# Patient Record
Sex: Female | Born: 1955 | Race: White | Hispanic: No | Smoking: Current every day smoker
Health system: Southern US, Community
[De-identification: ages and names within clinical notes are randomized; demographics above are authoritative.]

## PROBLEM LIST (undated history)

## (undated) DIAGNOSIS — K219 Gastro-esophageal reflux disease without esophagitis: Secondary | ICD-10-CM

## (undated) DIAGNOSIS — C801 Malignant (primary) neoplasm, unspecified: Secondary | ICD-10-CM

## (undated) DIAGNOSIS — C50919 Malignant neoplasm of unspecified site of unspecified female breast: Secondary | ICD-10-CM

## (undated) DIAGNOSIS — R7303 Prediabetes: Secondary | ICD-10-CM

## (undated) DIAGNOSIS — L02229 Furuncle of trunk, unspecified: Secondary | ICD-10-CM

## (undated) DIAGNOSIS — Z720 Tobacco use: Secondary | ICD-10-CM

## (undated) DIAGNOSIS — F419 Anxiety disorder, unspecified: Secondary | ICD-10-CM

## (undated) DIAGNOSIS — J189 Pneumonia, unspecified organism: Secondary | ICD-10-CM

## (undated) DIAGNOSIS — L02239 Carbuncle of trunk, unspecified: Secondary | ICD-10-CM

## (undated) DIAGNOSIS — A4902 Methicillin resistant Staphylococcus aureus infection, unspecified site: Secondary | ICD-10-CM

## (undated) DIAGNOSIS — Z923 Personal history of irradiation: Secondary | ICD-10-CM

## (undated) DIAGNOSIS — I10 Essential (primary) hypertension: Secondary | ICD-10-CM

## (undated) DIAGNOSIS — J45909 Unspecified asthma, uncomplicated: Secondary | ICD-10-CM

## (undated) DIAGNOSIS — Z733 Stress, not elsewhere classified: Secondary | ICD-10-CM

## (undated) DIAGNOSIS — R0600 Dyspnea, unspecified: Secondary | ICD-10-CM

## (undated) DIAGNOSIS — J449 Chronic obstructive pulmonary disease, unspecified: Secondary | ICD-10-CM

## (undated) DIAGNOSIS — E119 Type 2 diabetes mellitus without complications: Secondary | ICD-10-CM

## (undated) DIAGNOSIS — E785 Hyperlipidemia, unspecified: Secondary | ICD-10-CM

## (undated) DIAGNOSIS — F32A Depression, unspecified: Secondary | ICD-10-CM

## (undated) HISTORY — DX: Hyperlipidemia, unspecified: E78.5

## (undated) HISTORY — DX: Unspecified asthma, uncomplicated: J45.909

## (undated) HISTORY — DX: Anxiety disorder, unspecified: F41.9

## (undated) HISTORY — DX: Tobacco use: Z72.0

## (undated) HISTORY — DX: Essential (primary) hypertension: I10

## (undated) HISTORY — PX: TUBAL LIGATION: SHX77

## (undated) HISTORY — DX: Stress, not elsewhere classified: Z73.3

## (undated) HISTORY — DX: Carbuncle of trunk, unspecified: L02.239

## (undated) HISTORY — DX: Furuncle of trunk, unspecified: L02.229

## (undated) HISTORY — PX: TONSILLECTOMY: SUR1361

## (undated) HISTORY — DX: Methicillin resistant Staphylococcus aureus infection, unspecified site: A49.02

## (undated) HISTORY — PX: OTHER SURGICAL HISTORY: SHX169

---

## 2005-03-07 ENCOUNTER — Ambulatory Visit: Payer: Self-pay

## 2005-10-02 ENCOUNTER — Other Ambulatory Visit: Payer: Self-pay

## 2005-10-10 ENCOUNTER — Ambulatory Visit: Payer: Self-pay | Admitting: Obstetrics and Gynecology

## 2009-02-18 ENCOUNTER — Emergency Department: Payer: Self-pay | Admitting: Unknown Physician Specialty

## 2009-11-07 ENCOUNTER — Ambulatory Visit: Payer: Self-pay | Admitting: Family Medicine

## 2010-01-11 ENCOUNTER — Emergency Department: Payer: Self-pay | Admitting: Emergency Medicine

## 2010-01-15 ENCOUNTER — Ambulatory Visit: Payer: Self-pay | Admitting: Ophthalmology

## 2010-11-09 ENCOUNTER — Ambulatory Visit: Payer: Self-pay

## 2011-12-17 ENCOUNTER — Ambulatory Visit: Payer: Self-pay | Admitting: Family Medicine

## 2011-12-30 ENCOUNTER — Emergency Department: Payer: Self-pay | Admitting: Emergency Medicine

## 2011-12-30 LAB — CBC
HCT: 46.6 % (ref 35.0–47.0)
HGB: 15.3 g/dL (ref 12.0–16.0)
MCH: 29.5 pg (ref 26.0–34.0)
MCHC: 32.9 g/dL (ref 32.0–36.0)
MCV: 90 fL (ref 80–100)
Platelet: 233 10*3/uL (ref 150–440)
RBC: 5.2 10*6/uL (ref 3.80–5.20)
RDW: 13.5 % (ref 11.5–14.5)
WBC: 8 10*3/uL (ref 3.6–11.0)

## 2011-12-30 LAB — COMPREHENSIVE METABOLIC PANEL
Albumin: 3.9 g/dL (ref 3.4–5.0)
Alkaline Phosphatase: 96 U/L (ref 50–136)
Anion Gap: 9 (ref 7–16)
BUN: 9 mg/dL (ref 7–18)
Bilirubin,Total: 0.4 mg/dL (ref 0.2–1.0)
Calcium, Total: 9.6 mg/dL (ref 8.5–10.1)
Chloride: 105 mmol/L (ref 98–107)
Co2: 27 mmol/L (ref 21–32)
Creatinine: 0.65 mg/dL (ref 0.60–1.30)
EGFR (African American): >60
EGFR (Non-African Amer.): >60
Glucose: 104 mg/dL — ABNORMAL HIGH (ref 65–99)
Osmolality: 280 (ref 275–301)
Potassium: 3.6 mmol/L (ref 3.5–5.1)
SGOT(AST): 20 U/L (ref 15–37)
SGPT (ALT): 30 U/L
Sodium: 141 mmol/L (ref 136–145)
Total Protein: 7.7 g/dL (ref 6.4–8.2)

## 2011-12-30 LAB — TROPONIN I
Troponin-I: <0.02 ng/mL
Troponin-I: <0.02 ng/mL

## 2012-01-03 ENCOUNTER — Encounter: Payer: Self-pay | Admitting: *Deleted

## 2012-01-07 ENCOUNTER — Encounter: Payer: Self-pay | Admitting: Cardiovascular Disease

## 2012-01-07 ENCOUNTER — Ambulatory Visit (INDEPENDENT_AMBULATORY_CARE_PROVIDER_SITE_OTHER): Payer: BC Managed Care – PPO | Admitting: Cardiovascular Disease

## 2012-01-07 VITALS — BP 132/86 | HR 84 | Ht 67.0 in | Wt 176.0 lb

## 2012-01-07 DIAGNOSIS — R079 Chest pain, unspecified: Secondary | ICD-10-CM | POA: Insufficient documentation

## 2012-01-07 DIAGNOSIS — E785 Hyperlipidemia, unspecified: Secondary | ICD-10-CM

## 2012-01-07 DIAGNOSIS — R0609 Other forms of dyspnea: Secondary | ICD-10-CM

## 2012-01-07 DIAGNOSIS — R0600 Dyspnea, unspecified: Secondary | ICD-10-CM | POA: Insufficient documentation

## 2012-01-07 DIAGNOSIS — I10 Essential (primary) hypertension: Secondary | ICD-10-CM

## 2012-01-07 DIAGNOSIS — R0989 Other specified symptoms and signs involving the circulatory and respiratory systems: Secondary | ICD-10-CM

## 2012-01-07 DIAGNOSIS — F17200 Nicotine dependence, unspecified, uncomplicated: Secondary | ICD-10-CM

## 2012-01-07 DIAGNOSIS — Z720 Tobacco use: Secondary | ICD-10-CM | POA: Insufficient documentation

## 2012-01-07 NOTE — Assessment & Plan Note (Signed)
I discussed with her the importance of smoking cessation. 

## 2012-01-07 NOTE — Patient Instructions (Signed)
Your physician has requested that you have an exercise tolerance test. For further information please visit [email or web address]. Please also follow instruction sheet, as given.  Your physician has requested that you have an echocardiogram. Echocardiography is a painless test that uses sound waves to create images of your heart. It provides your doctor with information about the size and shape of your heart and how well your heart's chambers and valves are working. This procedure takes approximately one hour. There are no restrictions for this procedure.   

## 2012-01-07 NOTE — Assessment & Plan Note (Signed)
The patient does have exertional dyspnea which could be due to her prolonged history of smoking. I will request an echocardiogram to evaluate her LV systolic and diastolic function.

## 2012-01-07 NOTE — Assessment & Plan Note (Signed)
Given her family history of premature coronary artery disease, I agree with treatment with pravastatin. I recommend a target LDL of less than 100 if possible and triglycerides less than 150.

## 2012-01-07 NOTE — Assessment & Plan Note (Signed)
Her blood pressure is well controlled. Continue current medications. 

## 2012-01-07 NOTE — Assessment & Plan Note (Signed)
Her chest pain is atypical and could have been due to GERD. She has not had any further episodes. She does have risk factors for coronary artery disease and thus I recommend further ischemic cardiac evaluation. Her ECG is normal and she is able to exercise on a treadmill. Thus, I will request a treadmill stress test for further evaluation. I discussed with the patient the importance of lifestyle changes in order to decrease the chance of future coronary artery disease and cardiovascular events. We discussed the importance of controlling risk factors, smoking cessation, healthy diet as well as regular exercise. I also explained to him that a normal stress test does not rule out atherosclerosis.

## 2012-01-07 NOTE — Progress Notes (Signed)
HPI  This is a 56 year old female who is here today for evaluation of chest pain. She was referred from the emergency room at [facility]. She has chronic medical conditions that include hypertension, hyperlipidemia, tobacco use and family history of premature coronary artery disease. She went to the emergency room about a week ago after she had substernal tightness feeling which lasted for about 15-30 minutes. It was associated with heartburn and resolved with Pepcid. She thought it was triggered by tomato sauce. She was not aware of previous history of GERD. She has no history of coronary artery disease or other cardiac abnormalities. In the emergency room, her labs were unremarkable including negative cardiac enzymes. Chest x-ray was normal an ECG showed no acute changes. The patient has not had any further chest pain. She does complain of dyspnea with activities which she always contributed to her smoking history.  No Known Allergies   Current Outpatient Prescriptions on File Prior to Visit  Medication Sig Dispense Refill  . albuterol (PROAIR HFA) 108 (90 BASE) MCG/ACT inhaler Inhale 2 puffs into the lungs every 6 (six) hours as needed.      Marland Kitchen amLODipine (NORVASC) 5 MG tablet Take 1 tablet by mouth Daily.      . hydrochlorothiazide (HYDRODIURIL) 25 MG tablet Take 25 mg by mouth daily.      . pravastatin (PRAVACHOL) 40 MG tablet Take 40 mg by mouth daily.         Past Medical History  Diagnosis Date  . Anxiety   . Other psychological or physical stress, not elsewhere classified   . Methicillin resistant Staphylococcus aureus in conditions classified elsewhere and of unspecified site   . Carbuncle and furuncle of trunk   . Intrinsic asthma, unspecified   . Tobacco abuse   . HTN (hypertension)   . Hyperlipidemia      Past Surgical History  Procedure Date  . Tubal ligation     bilateral, sterile ablation  . Status post endometrial ablation   . Varicose veins   . Hysteroscopy with d&c    . Tonsillectomy      Family History  Problem Relation Age of Onset  . Coronary artery disease Mother   . Hypertension Mother   . Rheum arthritis Mother   . Heart attack Mother   . Lung cancer Father   . Emphysema Father   . Gout Father   . Diabetes Sister   . Hypertension Daughter   . Hypertension Daughter   . Obesity Daughter   . Seizures Daughter      History   Social History  . Marital Status: Married    Spouse Name: N/A    Number of Children: N/A  . Years of Education: N/A   Occupational History  . Not on file.   Social History Main Topics  . Smoking status: Current Everyday Smoker -- 1.0 packs/day  . Smokeless tobacco: Never Used  . Alcohol Use: No  . Drug Use: No  . Sexually Active: Not on file   Other Topics Concern  . Not on file   Social History Narrative  . No narrative on file     ROS Constitutional: Negative for fever, chills, diaphoresis, activity change, appetite change and fatigue.  HENT: Negative for hearing loss, nosebleeds, congestion, sore throat, facial swelling, drooling, trouble swallowing, neck pain, voice change, sinus pressure and tinnitus.  Eyes: Negative for photophobia, pain, discharge and visual disturbance.  Respiratory: Negative for apnea, cough and wheezing.  Cardiovascular: Negative  for  palpitations and leg swelling.  Gastrointestinal: Negative for nausea, vomiting, abdominal pain, diarrhea, constipation, blood in stool and abdominal distention.  Genitourinary: Negative for dysuria, urgency, frequency, hematuria and decreased urine volume.  Musculoskeletal: Negative for myalgias, back pain, joint swelling, arthralgias and gait problem.  Skin: Negative for color change, pallor, rash and wound.  Neurological: Negative for dizziness, tremors, seizures, syncope, speech difficulty, weakness, light-headedness, numbness and headaches.  Psychiatric/Behavioral: Negative for suicidal ideas, hallucinations, behavioral problems and  agitation. The patient is not nervous/anxious.    PHYSICAL EXAM   BP 132/86  Pulse 84  Ht 5\' 7"  (1.702 m)  Wt 176 lb (79.833 kg)  BMI 27.57 kg/m2 Constitutional: She is oriented to person, place, and time. She appears well-developed and well-nourished. No distress.  HENT: No nasal discharge.  Head: Normocephalic and atraumatic.  Eyes: Pupils are equal and round. Right eye exhibits no discharge. Left eye exhibits no discharge.  Neck: Normal range of motion. Neck supple. No JVD present. No thyromegaly present. No carotid bruits Cardiovascular: Normal rate, regular rhythm, normal heart sounds. Exam reveals no gallop and no friction rub. No murmur heard.  Pulmonary/Chest: Effort normal and breath sounds normal. No stridor. No respiratory distress. She has no wheezes. She has no rales. She exhibits no tenderness.  Abdominal: Soft. Bowel sounds are normal. She exhibits no distension. There is no tenderness. There is no rebound and no guarding.  Musculoskeletal: Normal range of motion. She exhibits no edema and no tenderness.  Neurological: She is alert and oriented to person, place, and time. Coordination normal.  Skin: Skin is warm and dry. No rash noted. She is not diaphoretic. No erythema. No pallor.  Psychiatric: She has a normal mood and affect. Her behavior is normal. Judgment and thought content normal.     EKG: Normal sinus rhythm with no significant ST or T wave changes.   ASSESSMENT AND PLAN

## 2012-01-15 ENCOUNTER — Other Ambulatory Visit: Payer: BC Managed Care – PPO

## 2012-01-17 ENCOUNTER — Encounter: Payer: Self-pay | Admitting: Cardiovascular Disease

## 2012-01-17 ENCOUNTER — Ambulatory Visit (INDEPENDENT_AMBULATORY_CARE_PROVIDER_SITE_OTHER): Payer: BC Managed Care – PPO | Admitting: Cardiovascular Disease

## 2012-01-17 DIAGNOSIS — R079 Chest pain, unspecified: Secondary | ICD-10-CM

## 2012-01-17 NOTE — Procedures (Signed)
    Treadmill Stress test  Indication: Atypical chest pain  Baseline Data:  Resting EKG shows NSR with rate of 75 bpm, no significant ST or T wave changes Resting blood pressure of 140/80 mm Hg Stand bruce protocal was used.  Exercise Data:  Patient exercised for 7 min 7 sec,  Peak heart rate of 139 bpm.  This was 85 % of the maximum predicted heart rate. No symptoms of chest pain or lightheadedness were reported at peak stress or in recovery.  Peak Blood pressure recorded was 200/98 Maximal work level: 10.1 METs.  Heart rate at 3 minutes in recovery was 93 bpm. BP response: Hypertensive. HR response: Normal.  EKG with Exercise: Sinus tachycardia with no significant ST changes with exercise.  FINAL IMPRESSION: Normal exercise stress test. No significant EKG changes concerning for ischemia. Good exercise tolerance. Hypertensive response to exercise.  Recommendation: The chest pain was likely noncardiac.

## 2012-02-05 ENCOUNTER — Other Ambulatory Visit: Payer: BC Managed Care – PPO

## 2012-03-04 ENCOUNTER — Telehealth: Payer: Self-pay

## 2012-03-04 NOTE — Telephone Encounter (Signed)
Message copied by Marcelle Overlie on Wed Mar 04, 2012  9:39 AM ------      Message from: Thersa Salt      Created: Tue Mar 03, 2012  4:04 PM      Regarding: RE: question       Billing [phone]      ----- Message -----         From: Marcelle Overlie, RN         Sent: 03/03/2012   2:44 PM           To: Angelina Sheriff Little      Subject: question                                                 Pt has questions re: bill she rec'd for test/OV.  Who can I forward this to?

## 2012-03-04 NOTE — Telephone Encounter (Signed)
LM on pt's machine with billing phone #

## 2012-03-21 ENCOUNTER — Emergency Department: Payer: Self-pay | Admitting: Internal Medicine

## 2012-03-21 LAB — CBC
HGB: 14.9 g/dL (ref 12.0–16.0)
MCH: 29.7 pg (ref 26.0–34.0)
MCHC: 33.2 g/dL (ref 32.0–36.0)
MCV: 89 fL (ref 80–100)
Platelet: 234 10*3/uL (ref 150–440)
RDW: 13.6 % (ref 11.5–14.5)

## 2012-03-21 LAB — BASIC METABOLIC PANEL
BUN: 7 mg/dL (ref 7–18)
Calcium, Total: 9.8 mg/dL (ref 8.5–10.1)
Chloride: 104 mmol/L (ref 98–107)
Creatinine: 0.62 mg/dL (ref 0.60–1.30)
EGFR (African American): >60
EGFR (Non-African Amer.): >60
Glucose: 113 mg/dL — ABNORMAL HIGH (ref 65–99)
Potassium: 3.4 mmol/L — ABNORMAL LOW (ref 3.5–5.1)
Sodium: 140 mmol/L (ref 136–145)

## 2012-03-21 LAB — CK TOTAL AND CKMB (NOT AT ARMC): CK-MB: 1.2 ng/mL (ref 0.5–3.6)

## 2012-12-23 ENCOUNTER — Ambulatory Visit: Payer: Self-pay | Admitting: Family Medicine

## 2014-02-02 ENCOUNTER — Ambulatory Visit: Payer: Self-pay | Admitting: Family Medicine

## 2014-05-24 ENCOUNTER — Ambulatory Visit: Payer: Self-pay | Admitting: Anesthesiology

## 2014-05-24 LAB — POTASSIUM: POTASSIUM: 3.4 mmol/L — AB (ref 3.5–5.1)

## 2014-05-25 ENCOUNTER — Ambulatory Visit: Payer: Self-pay | Admitting: Vascular Surgery

## 2014-05-26 LAB — PATHOLOGY REPORT

## 2014-12-17 ENCOUNTER — Emergency Department
Admit: 2014-12-17 | Disposition: A | Discharge status: Home / Self Care | Payer: Self-pay | Admitting: Emergency Medicine

## 2014-12-17 LAB — TROPONIN I: Troponin-I: <0.03 ng/mL

## 2014-12-17 LAB — CBC
HCT: 45.2 %
HGB: 15.2 g/dL
MCH: 29.7 pg
MCHC: 33.5 g/dL
MCV: 89 fL
Platelet: 227 x10 3/mm 3
RBC: 5.1 X10 6/mm 3
RDW: 13.3 %
WBC: 13.1 x10 3/mm 3 — ABNORMAL HIGH

## 2014-12-17 LAB — COMPREHENSIVE METABOLIC PANEL
ALBUMIN: 4.2 g/dL
ALK PHOS: 79 U/L
AST: 19 U/L
Anion Gap: 8 (ref 7–16)
BUN: 6 mg/dL
Bilirubin,Total: 0.6 mg/dL
Calcium, Total: 9.3 mg/dL
Chloride: 98 mmol/L — ABNORMAL LOW
Co2: 28 mmol/L
Creatinine: 0.67 mg/dL
EGFR (African American): >60
EGFR (Non-African Amer.): >60
GLUCOSE: 100 mg/dL — AB
Potassium: 3.4 mmol/L — ABNORMAL LOW
SGPT (ALT): 13 U/L — ABNORMAL LOW
Sodium: 134 mmol/L — ABNORMAL LOW
TOTAL PROTEIN: 7.1 g/dL

## 2014-12-17 LAB — CK TOTAL AND CKMB (NOT AT ARMC)
CK, TOTAL: 129 U/L
CK-MB: 3.8 ng/mL

## 2014-12-19 ENCOUNTER — Inpatient Hospital Stay
Admit: 2014-12-19 | Disposition: A | Discharge status: Home / Self Care | Payer: Self-pay | Attending: Internal Medicine | Admitting: Internal Medicine

## 2014-12-19 LAB — CBC WITH DIFFERENTIAL/PLATELET
Basophil #: 0 10*3/uL (ref 0.0–0.1)
Basophil %: 0.2 %
EOS ABS: 0 10*3/uL (ref 0.0–0.7)
EOS PCT: 0.2 %
HCT: 44.6 % (ref 35.0–47.0)
HGB: 14.6 g/dL (ref 12.0–16.0)
Lymphocyte #: 0.6 10*3/uL — ABNORMAL LOW (ref 1.0–3.6)
Lymphocyte %: 3.3 %
MCH: 28.9 pg (ref 26.0–34.0)
MCHC: 32.7 g/dL (ref 32.0–36.0)
MCV: 88 fL (ref 80–100)
MONOS PCT: 2.3 %
Monocyte #: 0.4 x10 3/mm (ref 0.2–0.9)
NEUTROS PCT: 94 %
Neutrophil #: 16 10*3/uL — ABNORMAL HIGH (ref 1.4–6.5)
PLATELETS: 220 10*3/uL (ref 150–440)
RBC: 5.05 10*6/uL (ref 3.80–5.20)
RDW: 13.6 % (ref 11.5–14.5)
WBC: 17 10*3/uL — AB (ref 3.6–11.0)

## 2014-12-19 LAB — BASIC METABOLIC PANEL
Anion Gap: 12 (ref 7–16)
BUN: 38 mg/dL — ABNORMAL HIGH
CHLORIDE: 94 mmol/L — AB
CO2: 25 mmol/L
CREATININE: 1.5 mg/dL — AB
Calcium, Total: 9.5 mg/dL
EGFR (African American): 44 — ABNORMAL LOW
GFR CALC NON AF AMER: 38 — AB
GLUCOSE: 144 mg/dL — AB
POTASSIUM: 3.2 mmol/L — AB
SODIUM: 131 mmol/L — AB

## 2014-12-19 LAB — URINALYSIS, COMPLETE
Bilirubin,UR: NEGATIVE
Glucose,UR: NEGATIVE mg/dL (ref 0–75)
KETONE: NEGATIVE
Leukocyte Esterase: NEGATIVE
Nitrite: NEGATIVE
PROTEIN: NEGATIVE
Ph: 5 (ref 4.5–8.0)
RBC,UR: NONE SEEN /HPF (ref 0–5)
SPECIFIC GRAVITY: 1.009 (ref 1.003–1.030)

## 2014-12-19 LAB — LIPASE, BLOOD: Lipase: 18 U/L — ABNORMAL LOW

## 2014-12-19 LAB — LACTIC ACID, PLASMA: LACTIC ACID, VENOUS: 1.5 mmol/L

## 2014-12-20 LAB — CBC WITH DIFFERENTIAL/PLATELET
Basophil #: 0 10*3/uL (ref 0.0–0.1)
Basophil %: 0.1 %
Eosinophil #: 0.2 10*3/uL (ref 0.0–0.7)
Eosinophil %: 1.2 %
HCT: 37.6 % (ref 35.0–47.0)
HGB: 12.6 g/dL (ref 12.0–16.0)
LYMPHS ABS: 0.8 10*3/uL — AB (ref 1.0–3.6)
Lymphocyte %: 5.4 %
MCH: 29.6 pg (ref 26.0–34.0)
MCHC: 33.6 g/dL (ref 32.0–36.0)
MCV: 88 fL (ref 80–100)
MONOS PCT: 3 %
Monocyte #: 0.4 x10 3/mm (ref 0.2–0.9)
Neutrophil #: 13.4 10*3/uL — ABNORMAL HIGH (ref 1.4–6.5)
Neutrophil %: 90.3 %
PLATELETS: 171 10*3/uL (ref 150–440)
RBC: 4.27 10*6/uL (ref 3.80–5.20)
RDW: 13.7 % (ref 11.5–14.5)
WBC: 14.8 10*3/uL — AB (ref 3.6–11.0)

## 2014-12-20 LAB — BASIC METABOLIC PANEL
Anion Gap: 6 — ABNORMAL LOW (ref 7–16)
BUN: 21 mg/dL — ABNORMAL HIGH
CALCIUM: 8.6 mg/dL — AB
CO2: 25 mmol/L
Chloride: 105 mmol/L
Creatinine: 0.74 mg/dL
EGFR (African American): >60
Glucose: 104 mg/dL — ABNORMAL HIGH
POTASSIUM: 2.8 mmol/L — AB
SODIUM: 136 mmol/L

## 2014-12-20 LAB — MAGNESIUM: Magnesium: 1.8 mg/dL

## 2014-12-21 LAB — BASIC METABOLIC PANEL
Anion Gap: 7 (ref 7–16)
BUN: 7 mg/dL
CHLORIDE: 105 mmol/L
CO2: 26 mmol/L
CREATININE: 0.55 mg/dL
Calcium, Total: 8.6 mg/dL — ABNORMAL LOW
EGFR (African American): >60
EGFR (Non-African Amer.): >60
Glucose: 90 mg/dL
POTASSIUM: 2.8 mmol/L — AB
Sodium: 138 mmol/L

## 2014-12-21 LAB — MAGNESIUM: Magnesium: 1.8 mg/dL

## 2014-12-22 LAB — BASIC METABOLIC PANEL
Anion Gap: 5 — ABNORMAL LOW (ref 7–16)
BUN: 5 mg/dL — AB
CHLORIDE: 109 mmol/L
CO2: 26 mmol/L
CREATININE: 0.51 mg/dL
Calcium, Total: 9.2 mg/dL
Glucose: 97 mg/dL
POTASSIUM: 3.9 mmol/L
Sodium: 140 mmol/L

## 2014-12-22 LAB — VANCOMYCIN, TROUGH: Vancomycin, Trough: 6 ug/mL — ABNORMAL LOW

## 2014-12-23 LAB — CBC WITH DIFFERENTIAL/PLATELET
COMMENT - H1-COM1: NORMAL
Comment - H1-Com2: NORMAL
HCT: 40.9 % (ref 35.0–47.0)
HGB: 13.4 g/dL (ref 12.0–16.0)
LYMPHS PCT: 17 %
MCH: 29.2 pg (ref 26.0–34.0)
MCHC: 32.8 g/dL (ref 32.0–36.0)
MCV: 89 fL (ref 80–100)
Monocytes: 12 %
Myelocyte: 1 %
Platelet: 231 10*3/uL (ref 150–440)
RBC: 4.59 10*6/uL (ref 3.80–5.20)
RDW: 14.1 % (ref 11.5–14.5)
Segmented Neutrophils: 70 %
WBC: 9.1 10*3/uL (ref 3.6–11.0)

## 2014-12-23 LAB — VANCOMYCIN, TROUGH: Vancomycin, Trough: 11 ug/mL

## 2014-12-24 LAB — POTASSIUM: Potassium: 4.2 mmol/L

## 2014-12-24 LAB — VANCOMYCIN, TROUGH: VANCOMYCIN, TROUGH: 17 ug/mL

## 2014-12-24 LAB — CULTURE, BLOOD (SINGLE)

## 2014-12-24 LAB — CREATININE, SERUM
CREATININE: 0.54 mg/dL
EGFR (Non-African Amer.): >60

## 2014-12-24 NOTE — Op Note (Signed)
PATIENT NAME:  Alexandria [name], Alexandria [name] MR#:  [id number] DATE OF BIRTH:  1956/08/10  DATE OF PROCEDURE:  05/25/2014  PREOPERATIVE DIAGNOSIS: Painful cyst, right lower extremity.   POSTOPERATIVE DIAGNOSIS: Painful cyst, right lower extremity.   PROCEDURE: Excision of right leg cyst.   SURGEON: Algernon Huxley, M.D.   ANESTHESIA: MAC.   ESTIMATED BLOOD LOSS: Minimal.   INDICATION FOR PROCEDURE: A 59 year old female with a painful cyst behind and just above her right knee. This was near her hamstring muscle and she had pain with sitting and with certain bendings of her knee. She desired to have this removed. This has enlarged over several years. It was discussed, this is highly unlikely to be any malignant process or anything dangerous, but as it was symptomatic, it was reasonable to remove this. She desired to proceed.   DESCRIPTION OF PROCEDURE: The patient was brought to the vascular suite. Right lower extremity was sterilely prepped and draped. A sterile surgical field was created. An elliptical incision was made around the cyst. After the area was locally anesthetized with a mixture of 1% lidocaine and 0.5% Marcaine, the cyst was then excised, taking care to get the entire cyst wall with the resection. The wound was then closed with 3-0 Vicryl suture in the subcutaneous tissue and 4 mattress 3-0 nylon sutures to close the skin. A sterile dressing was placed. The patient tolerated the procedure well and was taken to the recovery room in stable condition.     ____________________________ Algernon Huxley, MD jsd:JT D: 05/25/2014 13:22:56 ET T: 05/25/2014 13:56:39 ET JOB#: [id number]  cc: Algernon Huxley, MD, <Dictator> Algernon Huxley MD ELECTRONICALLY SIGNED 06/14/2014 10:19

## 2014-12-25 LAB — CBC WITH DIFFERENTIAL/PLATELET
BASOS ABS: 0.1 10*3/uL (ref 0.0–0.1)
Basophil %: 0.9 %
EOS ABS: 0.2 10*3/uL (ref 0.0–0.7)
EOS PCT: 2.1 %
HCT: 38.9 % (ref 35.0–47.0)
HGB: 12.9 g/dL (ref 12.0–16.0)
LYMPHS PCT: 19.8 %
Lymphocyte #: 1.9 10*3/uL (ref 1.0–3.6)
MCH: 29.1 pg (ref 26.0–34.0)
MCHC: 33.2 g/dL (ref 32.0–36.0)
MCV: 88 fL (ref 80–100)
MONO ABS: 0.7 x10 3/mm (ref 0.2–0.9)
Monocyte %: 7.2 %
NEUTROS PCT: 70 %
Neutrophil #: 6.6 10*3/uL — ABNORMAL HIGH (ref 1.4–6.5)
PLATELETS: 273 10*3/uL (ref 150–440)
RBC: 4.43 10*6/uL (ref 3.80–5.20)
RDW: 13.8 % (ref 11.5–14.5)
WBC: 9.4 10*3/uL (ref 3.6–11.0)

## 2014-12-25 LAB — BASIC METABOLIC PANEL
Anion Gap: 6 — ABNORMAL LOW (ref 7–16)
BUN: 10 mg/dL
CALCIUM: 9.2 mg/dL
Chloride: 100 mmol/L — ABNORMAL LOW
Co2: 27 mmol/L
Creatinine: 0.58 mg/dL
Glucose: 98 mg/dL
Potassium: 3.6 mmol/L
SODIUM: 133 mmol/L — AB

## 2014-12-26 LAB — CREATININE, SERUM
CREATININE: 0.53 mg/dL
EGFR (African American): >60
EGFR (Non-African Amer.): >60

## 2015-01-01 NOTE — Consult Note (Signed)
Brief Consult Note: Diagnosis: LUE cellulitus.   Patient was seen by consultant.   Consult note dictated.   Recommend further assessment or treatment.   Orders entered.   Comments: Continue current abx. Elevate arm as much as possible Can transition to orals in next 2-3 days if improving clinically.  Electronic Signatures: Angelena Form (MD)  (Signed 22-Apr-16 15:12)  Authored: Brief Consult Note   Last Updated: 22-Apr-16 15:12 by Angelena Form (MD)

## 2015-01-01 NOTE — Consult Note (Addendum)
PATIENT NAME:  Alexandria [name], Alexandria [name] MR#:  [id number] DATE OF BIRTH:  April 05, 1956  DATE OF CONSULTATION:  12/23/2014  REFERRING PHYSICIAN:   CONSULTING PHYSICIAN:  Cheral Marker. Ola Spurr, MD  REASON FOR CONSULTATION: Left upper extremity cellulitis.   HISTORY OF PRESENT ILLNESS: This is a pleasant 59 year old female with history of anxiety, hypertension, GERD and tobacco abuse, admitted with increasing redness and swelling of her left arm and elbow over several days. She was admitted on April 18. She had reported having no trauma to the area or insect bites. She has not had infection in this area before. She was found on admission to have leukocytosis. She also developed nausea and vomiting and had some acute kidney injury. She was admitted and has been on antibiotics since then. She has been elevating the arm and there has been slight improvement.   PAST MEDICAL HISTORY: 1. Hypertension.  2. Tobacco abuse.  3. GERD.  4. Hyperlipidemia.   PAST SURGICAL HISTORY: Colonoscopy, uterine ablation, tubal ligation, tonsillectomy, varicose vein stripping.   SOCIAL HISTORY: Smokes a pack a day. No alcohol or other drugs.   FAMILY HISTORY: Positive for coronary artery disease,  diabetes, and chronic obstructive pulmonary disease.   ALLERGIES: No known drug allergies.   MEDICATIONS: Reviewed in her MAR. Antibiotics since admission include vancomycin and Unasyn.   REVIEW OF SYSTEMS:  Eleven systems reviewed and negative except as per history of present illness.   PHYSICAL EXAMINATION: VITAL SIGNS: Temperature 98.7, pulse 88, blood pressure 148/68, respirations 18, saturation 96% on room air.  GENERAL: She is disheveled pleasant, interactive, in no acute distress.  HEENT: Pupils reactive. Sclerae anicteric.  OROPHARYNX: Clear with no thrush.  NECK: Supple.  HEART: Regular.  LUNGS: Clear.  ABDOMEN: Soft, nontender, nondistended.  EXTREMITIES: She has no lower extremity edema.  Her left upper extremity has 1+  to 2+ edema.  SKIN: There is erythema from above her elbow down to  mid forearm. There is some bogginess over her olecranon bursa, but no obvious drainable abscess.  NEUROLOGIC: She is alert and oriented x3. Grossly nonfocal neurological exam.   LABORATORY DATA: White blood count on admission April 16 was 13.1. It  elevated to 17.0 on April 18.   On April 22 it was 9.1, hemoglobin 13.4, platelets 231,000, blood cultures April 18 x2 negative. Urinalysis April 18 negative. Renal function initially had a creatinine of 1.50 currently 0.54. LFTs are within normal limits. Troponins were negative.   IMPRESSION: A 59 year old female with a history of prior methicillin-resistant Staphylococcus aureus boils per history who was admitted with left upper extremity swelling, redness, and drainage. She had no trauma or other prior predisposing injury. MRI shows what appears to be cellulitis or myositis. There is a tiny fluid collection, but nothing that is drainable. She has been seen by surgery as well. She has no evidence of a left upper extremity deep vein thrombosis. Clinically, she is improving with vancomycin and Unasyn.   RECOMMENDATIONS: 1. Continue vancomycin and Unasyn for now.  2. Elevate arm as much as possible.  3. She will likely need 1-2 more days of IV antibiotics and then could be transitioned to orals. Potentially she could be placed on Bactrim as well as Keflex to cover both Staph and Strep and MRSA.   Thank you for the consult. I will be glad to follow with you.    ____________________________ Cheral Marker. Ola Spurr, MD dpf:tr D: 12/24/2014 11:09:35 ET T: 12/24/2014 11:29:25 ET JOB#: [id number]  cc: Cheral Marker.  Ola Spurr, MD, <Dictator> Chalmer Zheng Ola Spurr MD ELECTRONICALLY SIGNED 01/03/2015 10:21

## 2015-01-01 NOTE — Discharge Summary (Signed)
PATIENT NAME:  Alexandria [name], WEBER MR#:  [id number] DATE OF BIRTH:  1955-09-30  DATE OF ADMISSION:  12/19/2014 DATE OF DISCHARGE:  12/26/2014  ADMITTING PHYSICIAN:   Lance Coon, MD  DISCHARGING PHYSICIAN: Gladstone Lighter, MD  PRIMARY CARE PHYSICIAN:  Lelon Huh, MD   [place]:  Surgical consultation by Sherri Rad, MD  ID consultation by Adrian Prows, MD   DISCHARGE DIAGNOSES: 1. Sepsis.  2. Left arm cellulitis.  3. Tobacco use disorder.  4. Gastroesophageal reflex disease.  5. Anxiety disorder.   HOME MEDICATIONS: 1. Pravastatin 40 mg p.o. daily.  2. Sertraline 50 mg p.o. in the morning.  3. Hydrochlorothiazide 25 mg p.o. daily.  4. Klonopin 0.5 mg p.o. daily.  5. Amlodipine 10 mg p.o. daily.  6. Norco 5/325 milligrams 1 tablet q. 6 hours p.r.n. for pain.  7. Bactrim double strength 1 tablet p.o. b.i.d. for 10 days.  8. Keflex 500 mg p.o. q. 8 hours for 10 more days.   DISCHARGE DIET: Regular diet.   DISCHARGE ACTIVITY: As tolerated.   FOLLOWUP INSTRUCTIONS: 1. Follow up with Dr. Ola Spurr in 1-2 weeks.  2. PCP follow-up in 2 weeks.   LABORATORY AND IMAGING STUDIES PRIOR TO DISCHARGE:  1. Sodium 133, potassium 3.6, chloride 100, bicarbonate 27, BUN 10, creatinine 0.58, glucose 98, and calcium of 9.2.  2. WBC 9.4, hemoglobin 12.9, hematocrit 38.9, platelet count is 273,000.  3. MRI of the left elbow without contrast showing subcutaneous edema which is intense with fluid in olecranon bursa and scattered intramuscular edema, which is nonspecific.  Compatible with cellulitis either infectious or inflammatory in origin. Negative for any septic joint or osteomyelitis.   BRIEF HOSPITAL COURSE: Alexandria [name] is a 59 year old Caucasian female with past medical history significant for anxiety, gastroesophageal reflex disease, who presents to the hospital secondary to worsening left arm erythema and tenderness and swelling, failed outpatient antibiotic treatment.   1. Left arm cellulitis.  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 She was noted to be septic on admission.  2. Blood cultures are negative. MRI of the elbow was done to make sure there is no joint involvement because  her cellulitis appeared very significant. No joint involvement was there, it was all subcutaneous edema. Seen by surgery who did not recommend any other changes.   She was on vancomycin and Unasyn and seen by ID who recommended transition to Bactrim and Keflex for both Staphylococcus and Streptococcus coverage for 10 more days. The patient has already received almost 5 days of IV antibiotics here in the hospital. An ACE wrap was advised to keep the swelling down and was advised not to wrap it too tight. She was strongly advised to keep her arm elevated and she will followup with ID in 1-2 weeks.  3. Acute renal failure in the hospital on admission secondary to sepsis. IV fluids were given and her  kidney function normalized.  4. Depression and anxiety. Home medications were continued.  5. Gastroesophageal reflex disease, on PPI.    Her course has been otherwise uneventful in the hospital.   DISCHARGE CONDITION: Stable.   DISCHARGE DISPOSITION: Home.   TIME SPENT ON DISCHARGE: Is 45 minutes.    ____________________________ Gladstone Lighter, MD rk:tr D: 12/26/2014 14:30:43 ET T: 12/26/2014 18:32:32 ET JOB#: [id number]  cc: Gladstone Lighter, MD, <Dictator> Gladstone Lighter MD ELECTRONICALLY SIGNED 12/29/2014 18:16

## 2015-01-01 NOTE — Consult Note (Signed)
PATIENT NAME:  Alexandria [name], SCHATZ MR#:  [id number] DATE OF BIRTH:  Feb 14, 1956  DATE OF CONSULTATION:  12/21/2014  CONSULTING PHYSICIAN:  Elta Guadeloupe A. Marina Gravel, MD  HISTORY OF PRESENT ILLNESS:   A 59 year old female admitted to the medical service with progressive swelling and cellulitis of her left arm and elbow.  She has been on the medical service since the 18th.  Surgical services were asked to consult regarding possible abscess.   ALLERGIES:  None.   MEDICATIONS AT PRESENT:  Include:  1. Intravenous fluids.  2. Clonazepam.  3. Lovenox. 4. Pravachol.   5. Zoloft.  6. Norvasc.  7. Unasyn.  8. Vancomycin.  9. Acetaminophen.  10.  Nicotine.  11.  Oral inhaler. 12.  Potassium chloride.  PAST MEDICAL HISTORY:  Significant for tobacco abuse and dependence, anxiety, hyperlipidemia, hypertension, gastroesophageal reflux disease.   PAST SURGICAL HISTORY:  Significant for uterine ablation, tubal ligation, tonsillectomy, varicose vein stripping, and colonoscopy.   FAMILY HISTORY:  Significant for diabetes and COPD.   SOCIAL HISTORY:  One pack per day, 30 years.  No alcohol.  No drug use.   REVIEW OF SYSTEMS:  Significant for pain around the left arm of 3 days' duration. Remaining 10-point review is negative.       PHYSICAL EXAMINATION:  GENERAL:  The patient is in no obvious distress.  Daughter is at the bedside.  Affect is normal.  Facies are symmetrical.  VITAL SIGNS: Temperature is 98.2, pulse of 87.  Blood pressure is 146/78.  Room air saturation is 98%.  SKIN:  Otherwise normal except for the left arm where there is intense cellulitis.  There is no significant tenderness in either the forearm or upper arm compartment.  There is some flexion of the left elbow secondary to pain.  I cannot appreciate an obvious elbow effusion.  There is no obvious abscess to drain.  The most intense cellulitis appears to be centered around the elbow.  EXTREMITIES:  Otherwise warm and well perfused.  PSYCHIATRIC:   Appropriate mood, judgment, and affect.   LABORATORY VALUES:  White count is 14.8, hemoglobin 12.6.  Platelet count is 171,000. Potassium is 2.8.  Remaining electrolytes are unremarkable.  MRI scan is currently pending. Ultrasound was reviewed from the 19th.  There is no evidence of an upper extremity DVT.   Chest x-ray dated the 16th is negative.   IMPRESSION:  Left arm cellulitis.  I do not see an obvious abscess.  I do not disagree with MRI scan to look for a joint effusion, although given the history of the cellulitis starting in the upper arm and progressing distally argues against an orthoscopic origin of the cellulitis.   PLAN:  I will follow up on the MRI scan.  I would recommend elevation and infectious disease consultation.    ____________________________ Jeannette How Marina Gravel, MD mab:kc D: 12/21/2014 17:14:00 ET T: 12/21/2014 17:44:46 ET JOB#: [id number]  cc: Elta Guadeloupe A. Marina Gravel, MD, <Dictator> Hortencia Conradi MD ELECTRONICALLY SIGNED 12/28/2014 12:41

## 2015-01-01 NOTE — H&P (Signed)
PATIENT NAME:  Alexandria, [name] MR#:  [id number] DATE OF BIRTH:  10-14-1955  DATE OF ADMISSION:  12/19/2014  CHIEF COMPLAINT: Cellulitis.   HISTORY OF PRESENT ILLNESS: This is a 59 year old female who began having pain in her left upper arm 2 days ago and 1 day ago began noticing some swelling and some erythema on her skin there. It continued to progress. She went to see her PCP earlier the day of admission, was given antibiotics, a shot of she does not know what, and a prescription for further antibiotics. She did not want to come to the ED at that time  but was told by her PCP that if she began to feel worse or began to have chills or fevers or anything like that she needed to come in to be seen. She has had intermittent chills throughout the course of the past couple of days and then around 4:00 in the afternoon she began having significant nausea and dry heaving and so she came to the ED. She felt like the erythema was still continuing to progress. In the ED she was found to have elevated white count, to be tachycardic with somewhat low blood pressure as well as a little bit of AKI on labs. See full labs below. Hospitalists were called for admission for sepsis secondary to cellulitis.   PRIMARY CARE PHYSICIAN: Kirstie Peri. Caryn Section, MD   PAST MEDICAL HISTORY: GERD, anxiety, hyperlipidemia, hypertension.   CURRENT MEDICATIONS: Sertraline 50 mg daily, pravastatin 40 mg daily, hydrochlorothiazide 25 mg daily, clonazepam 0.5 mg daily, Norvasc 5 mg daily, Aleve as needed for pain   PAST SURGICAL HISTORY: Colonoscopy, uterine ablation, tubal ligation, tonsillectomy, varicose vein stripping.   ALLERGIES: No known drug allergies.   FAMILY HISTORY: CAD, diabetes mellitus, COPD.   SOCIAL HISTORY: She is a 1 pack per day for 30+ years smoker. Denies alcohol or illicit drug use.   REVIEW OF SYSTEMS:  CONSTITUTIONAL: Denies fever, fatigue, or weakness. Endorses intermittent chills.  EYES: Denies blurred, double  vision, pain or redness.  EAR, NOSE, AND THROAT: Denies ear pain, hearing loss, difficulty swallowing.  RESPIRATORY: Denies cough, dyspnea, or painful respiration.  CARDIOVASCULAR: Denies chest pain or palpitations. Does have some swelling of the right upper extremity around the area of cellulitis.  GASTROINTESTINAL: Endorses some nausea and dry heaving earlier today. Denies vomiting or diarrhea, abdominal pain or constipation.  GENITOURINARY: Denies dysuria, hematuria, or frequency.  ENDOCRINE: Denies nocturia, thyroid problems, heat or cold intolerance.  HEMATOLOGIC AND LYMPHATIC: Denies easy bruising, bleeding, swollen glands.  INTEGUMENTARY: Denies acne. Denies overt rash. She does have this cellulitis of her right upper extremity, which is erythematous, tender, and warm to touch. Denies any other lesions.  MUSCULOSKELETAL: Denies acute arthritis, joint swelling, or gout.  NEUROLOGICAL: Denies numbness, weakness, headache.  PSYCHIATRIC: Denies anxiety, insomnia, depression.   PHYSICAL EXAMINATION:  VITAL SIGNS: Blood pressure 112/65, pulse 99, temperature 97.9, respirations 16, O2 saturation is 96% on room air.  GENERAL: This is a well-nourished female lying supine in bed in no acute distress.  HEENT: Pupils equal, round, and react to light and accommodation. Extraocular movements intact. No scleral icterus. Dry mucosal membranes.  NECK: Thyroid is not enlarged. Neck is supple, nontender. No masses. No cervical adenopathy. No JVD.  RESPIRATORY: Clear to auscultation bilaterally with no rales, rhonchi, or wheezing. No respiratory distress.  CARDIOVASCULAR: Borderline tachycardia. No murmur, rubs, or gallops on exam. Normal rhythm. Good pedal pulses with no lower extremity edema.  ABDOMEN: Soft,  nontender, nondistended with good bowel sounds.  MUSCULOSKELETAL: Five out of 5 muscular strength in all 4 extremities. Full spontaneous range of motion throughout. No cyanosis or clubbing.  SKIN: No  diffuse rash or lesions. She does have erythema of her right upper extremity extending from the proximal upper arm down all the way down to her wrist. She does have some induration on the posterior aspect of her upper arm and significant warmth in that area.   ADENOPATHY: No adenopathy.  NEUROLOGICAL: Cranial nerves intact. Sensation intact throughout and no dysarthria or aphasia.  PSYCHIATRIC: Alert and oriented x 3, cooperative, not confused or agitated.   LABORATORY DATA: White count 17, hemoglobin 14.6, hematocrit 44.6, platelets 220,000. Sodium 131, potassium 3.2, chloride 94, bicarbonate 25, BUN 38, creatinine 1.5. Just a couple of days ago her creatinine was 0.67, glucose 144, lipase 18, calcium 9.5.   No current radiographic imaging this admission.   ASSESSMENT AND PLAN:  1.  Sepsis. She is being given IV antibiotics, significant fluids. Her blood pressure is responding well to the fluids. We will admit her on telemetry. Her lactic acid was normal. Sepsis is likely due to the cellulitis and we will treat that as per cellulitis below.  2.  Cellulitis. She is being continued on IV vancomycin. 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 We have marked out her cellulitis and expect it to respond to vancomycin. We will monitor it and treat the sepsis as per problem stated above.  3.  Acute kidney injury. Renal function was normal a couple of days ago. The patient says that she feels dehydrated. We are giving her aggressive fluids and she is responding well to that. We will continue to monitor her serum creatinine level. We will renal dose her medications for right now and get a pharmacy consult to help [place] with vancomycin dosing.  4.  Anxiety. We will continue patient's home medications for this as a chronic stable problem.  5.  Hypertension. Again, chronic stable problem. Her blood pressure is actually on the low end right now. We will hold her antihypertensives until her  blood pressure normalizes. We will restart these as needed.  6.  Deep vein thrombosis prophylaxis. Subcutaneous heparin.    CODE STATUS: This patient is full code.   TIME SPENT ON THIS ADMISSION: 45 minutes.   ____________________________ Wilford Corner. Jannifer Franklin, MD dfw:AT D: 12/19/2014 23:37:14 ET T: 12/19/2014 23:56:10 ET JOB#: [id number]  cc: Wilford Corner. Jannifer Franklin, MD, <Dictator> Lacresia Darwish Fawn Kirk MD ELECTRONICALLY SIGNED 12/20/2014 1:49

## 2015-01-01 NOTE — Consult Note (Signed)
Brief Consult Note: Diagnosis: left arm and forearm cellulitis.  ? intra-articular source.   Patient was seen by consultant.   Comments: awaiting MRI which she is going down for in a few minutes.  Electronic Signatures: Sherri Rad (MD)  (Signed 20-Apr-16 14:45)  Authored: Brief Consult Note   Last Updated: 20-Apr-16 14:45 by Sherri Rad (MD)

## 2015-01-18 DIAGNOSIS — L03119 Cellulitis of unspecified part of limb: Secondary | ICD-10-CM | POA: Insufficient documentation

## 2015-01-18 DIAGNOSIS — M7030 Other bursitis of elbow, unspecified elbow: Secondary | ICD-10-CM | POA: Insufficient documentation

## 2015-02-07 ENCOUNTER — Other Ambulatory Visit: Payer: Self-pay | Admitting: Family Medicine

## 2015-02-07 DIAGNOSIS — I10 Essential (primary) hypertension: Secondary | ICD-10-CM

## 2015-02-07 MED ORDER — AMLODIPINE BESYLATE 10 MG PO TABS: 10.0000 mg | ORAL_TABLET | Freq: Every day | ORAL | Status: DC

## 2015-02-22 ENCOUNTER — Other Ambulatory Visit: Payer: Self-pay | Admitting: Family Medicine

## 2015-02-22 DIAGNOSIS — F419 Anxiety disorder, unspecified: Secondary | ICD-10-CM

## 2015-02-22 MED ORDER — CLONAZEPAM 0.5 MG PO TABS: 0.5000 mg | ORAL_TABLET | Freq: Two times a day (BID) | ORAL | Status: DC | PRN

## 2015-03-10 ENCOUNTER — Encounter: Payer: Self-pay | Admitting: Family Medicine

## 2015-03-10 ENCOUNTER — Ambulatory Visit (INDEPENDENT_AMBULATORY_CARE_PROVIDER_SITE_OTHER): Payer: BLUE CROSS/BLUE SHIELD | Admitting: Family Medicine

## 2015-03-10 VITALS — BP 132/74 | HR 76 | Temp 97.5°F | Resp 16 | Ht 67.5 in | Wt 154.6 lb

## 2015-03-10 DIAGNOSIS — Z720 Tobacco use: Secondary | ICD-10-CM | POA: Diagnosis not present

## 2015-03-10 DIAGNOSIS — Z0000 Encounter for general adult medical examination without abnormal findings: Secondary | ICD-10-CM | POA: Diagnosis not present

## 2015-03-10 DIAGNOSIS — Z23 Encounter for immunization: Secondary | ICD-10-CM

## 2015-03-10 DIAGNOSIS — I10 Essential (primary) hypertension: Secondary | ICD-10-CM | POA: Diagnosis not present

## 2015-03-10 DIAGNOSIS — Z8659 Personal history of other mental and behavioral disorders: Secondary | ICD-10-CM | POA: Diagnosis not present

## 2015-03-10 DIAGNOSIS — E785 Hyperlipidemia, unspecified: Secondary | ICD-10-CM

## 2015-03-10 NOTE — Progress Notes (Signed)
Subjective:     Patient ID: Alexandria [name], female   DOB: 04/28/1956, 59 y.o.   MRN: [id number]  HPI  Chief Complaint  Patient presents with  . Annual Exam    patient is present today for her annual physical she has no complaints or concerns. 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   Review of Systems General: Feeling well, cellulitis of left upper extremity has resolved after extended course of antibiotic per infectious disease. HEENT: edentulous; reports annual eye exams Cardiovascular: no chest pain, shortness of breath, or palpitations Respiratory: No desire to quit smoking. Uses albuterol inhaler prn. GI: no heartburn, no change in bowel habits or blood in the stool.Due f/u colonoscopy this year after polypectomy 3 years ago. GU: no change in bladder habits. Normal Pap smear with negative HPV in 2013  Psychiatric: not depressed Musculoskeletal: no joint pain    Objective:   Physical Exam Eyes: PERRLA, EOMI Neck: no thyromegaly, tenderness or nodules, carotids palpable with no bruits ENT: TM's intact without inflammation; No tonsillar enlargement or exudate, Lungs: Clear Breast: no axillary adenopathy, breast mass or nipple discharge. Heart : RRR without murmur or gallop Abd: bowel sounds present, soft, non-tender, no organomegaly Extremities: no edema,       Assessment:    1. Annual physical exam: update colonoscopy - Lipid panel - Comprehensive metabolic panel - MM Digital Screening; Future - Ambulatory referral to Gastroenterology  2. Hyperlipidemia-continue pravastatin - Lipid panel  3. Tobacco abuse  4. Essential hypertension-continue HCTZ and amlodipine - Comprehensive metabolic panel  5. Need for pneumococcal vaccination - Pneumococcal polysaccharide vaccine 23-valent greater than or equal to 2yo subcutaneous/IM  6. Need for Zostavax administration -  Varicella-zoster vaccine subcutaneous  7. History of anxiety: continue clonazepam as needed.    Plan:    Further f/u pending lab work

## 2015-03-10 NOTE — Patient Instructions (Signed)
We will call you with your lab results 

## 2015-03-11 LAB — COMPREHENSIVE METABOLIC PANEL
ALBUMIN: 4.6 g/dL (ref 3.6–4.8)
ALK PHOS: 85 IU/L (ref 39–117)
ALT: 9 IU/L (ref 0–32)
AST: 14 IU/L (ref 0–40)
Albumin/Globulin Ratio: 2.1 (ref 1.1–2.5)
BUN/Creatinine Ratio: 10 — ABNORMAL LOW (ref 11–26)
BUN: 7 mg/dL — AB (ref 8–27)
Bilirubin Total: 0.3 mg/dL (ref 0.0–1.2)
CO2: 28 mmol/L (ref 18–29)
Calcium: 10.5 mg/dL — ABNORMAL HIGH (ref 8.7–10.3)
Chloride: 99 mmol/L (ref 97–108)
Creatinine, Ser: 0.7 mg/dL (ref 0.57–1.00)
GFR calc Af Amer: 109 mL/min/{1.73_m2} (ref >59)
GFR calc non Af Amer: 94 mL/min/{1.73_m2} (ref >59)
GLUCOSE: 100 mg/dL — AB (ref 65–99)
Globulin, Total: 2.2 g/dL (ref 1.5–4.5)
Potassium: 4.5 mmol/L (ref 3.5–5.2)
Sodium: 143 mmol/L (ref 134–144)
Total Protein: 6.8 g/dL (ref 6.0–8.5)

## 2015-03-11 LAB — LIPID PANEL
CHOL/HDL RATIO: 3.7 ratio (ref 0.0–4.4)
Cholesterol, Total: 191 mg/dL (ref 100–199)
HDL: 51 mg/dL (ref >39)
LDL Calculated: 124 mg/dL — ABNORMAL HIGH (ref 0–99)
Triglycerides: 78 mg/dL (ref 0–149)
VLDL CHOLESTEROL CAL: 16 mg/dL (ref 5–40)

## 2015-03-13 ENCOUNTER — Telehealth: Payer: Self-pay

## 2015-03-13 NOTE — Telephone Encounter (Signed)
LMTCB

## 2015-03-13 NOTE — Telephone Encounter (Signed)
Patient advised.

## 2015-03-13 NOTE — Telephone Encounter (Signed)
-----   Message from Carmon Ginsberg, [place] sent at 03/13/2015  7:51 AM EDT ----- Labs ok. Sugar is very minimally elevated at 100. Encourage regular exercise and continue pravastatin

## 2015-03-16 ENCOUNTER — Other Ambulatory Visit: Payer: Self-pay

## 2015-03-16 ENCOUNTER — Telehealth: Payer: Self-pay | Admitting: Gastroenterology

## 2015-03-16 NOTE — Telephone Encounter (Signed)
Gastroenterology Pre-Procedure Review  Request Date: 04-10-2015 Requesting Physician: Dr. Allen Norris  PATIENT REVIEW QUESTIONS: The patient responded to the following health history questions as indicated:    1. Are you having any GI issues? no 2. Do you have a personal history of Polyps? yes (3years ago) 3. Do you have a family history of Colon Cancer or Polyps? no 4. Diabetes Mellitus? no 5. Joint replacements in the past 12 months?no 6. Major health problems in the past 3 months?no 7. Any artificial heart valves, MVP, or defibrillator?no    MEDICATIONS & ALLERGIES:    Patient reports the following regarding taking any anticoagulation/antiplatelet therapy:   Plavix, Coumadin, Eliquis, Xarelto, Lovenox, Pradaxa, Brilinta, or Effient? no Aspirin? no  Patient confirms/reports the following medications:  Current Outpatient Prescriptions  Medication Sig Dispense Refill   albuterol (PROAIR HFA) 108 (90 BASE) MCG/ACT inhaler Inhale 2 puffs into the lungs every 6 (six) hours as needed.     amLODipine (NORVASC) 10 MG tablet Take 1 tablet (10 mg total) by mouth daily. 90 tablet 3   clonazePAM (KLONOPIN) 0.5 MG tablet Take 1 tablet (0.5 mg total) by mouth 2 (two) times daily as needed for anxiety. 60 tablet 2   hydrochlorothiazide (HYDRODIURIL) 25 MG tablet Take 25 mg by mouth daily.     MULTIPLE VITAMIN PO Take 1 tablet by mouth daily.     pravastatin (PRAVACHOL) 40 MG tablet Take 40 mg by mouth daily.     No current facility-administered medications for this visit.    Patient confirms/reports the following allergies:  No Known Allergies  No orders of the defined types were placed in this encounter.    AUTHORIZATION INFORMATION Primary Insurance: 1D#: Group #:  Secondary Insurance: 1D#: Group #:  SCHEDULE INFORMATION: Date: 04-10-2015 Time: Location:[facility]

## 2015-04-03 ENCOUNTER — Encounter: Payer: Self-pay | Admitting: *Deleted

## 2015-04-06 NOTE — Discharge Instructions (Signed)

## 2015-04-10 ENCOUNTER — Ambulatory Visit: Payer: BLUE CROSS/BLUE SHIELD | Admitting: Anesthesiology

## 2015-04-10 ENCOUNTER — Encounter: Payer: Self-pay | Admitting: *Deleted

## 2015-04-10 ENCOUNTER — Ambulatory Visit
Admission: RE | Admit: 2015-04-10 | Discharge: 2015-04-10 | Disposition: A | Discharge status: Home / Self Care | Payer: BLUE CROSS/BLUE SHIELD | Source: Ambulatory Visit | Attending: Gastroenterology | Admitting: Gastroenterology

## 2015-04-10 ENCOUNTER — Encounter
Admission: RE | Disposition: A | Discharge status: Home / Self Care | Payer: Self-pay | Source: Ambulatory Visit | Attending: Gastroenterology

## 2015-04-10 ENCOUNTER — Other Ambulatory Visit: Payer: Self-pay | Admitting: Gastroenterology

## 2015-04-10 DIAGNOSIS — Z9889 Other specified postprocedural states: Secondary | ICD-10-CM | POA: Insufficient documentation

## 2015-04-10 DIAGNOSIS — J45909 Unspecified asthma, uncomplicated: Secondary | ICD-10-CM | POA: Insufficient documentation

## 2015-04-10 DIAGNOSIS — K621 Rectal polyp: Secondary | ICD-10-CM

## 2015-04-10 DIAGNOSIS — Z8249 Family history of ischemic heart disease and other diseases of the circulatory system: Secondary | ICD-10-CM | POA: Diagnosis not present

## 2015-04-10 DIAGNOSIS — Z801 Family history of malignant neoplasm of trachea, bronchus and lung: Secondary | ICD-10-CM | POA: Insufficient documentation

## 2015-04-10 DIAGNOSIS — F419 Anxiety disorder, unspecified: Secondary | ICD-10-CM | POA: Insufficient documentation

## 2015-04-10 DIAGNOSIS — D125 Benign neoplasm of sigmoid colon: Secondary | ICD-10-CM | POA: Diagnosis not present

## 2015-04-10 DIAGNOSIS — Z833 Family history of diabetes mellitus: Secondary | ICD-10-CM | POA: Insufficient documentation

## 2015-04-10 DIAGNOSIS — I10 Essential (primary) hypertension: Secondary | ICD-10-CM | POA: Insufficient documentation

## 2015-04-10 DIAGNOSIS — Z8614 Personal history of Methicillin resistant Staphylococcus aureus infection: Secondary | ICD-10-CM | POA: Insufficient documentation

## 2015-04-10 DIAGNOSIS — Z90710 Acquired absence of both cervix and uterus: Secondary | ICD-10-CM | POA: Insufficient documentation

## 2015-04-10 DIAGNOSIS — K5730 Diverticulosis of large intestine without perforation or abscess without bleeding: Secondary | ICD-10-CM | POA: Diagnosis not present

## 2015-04-10 DIAGNOSIS — Z825 Family history of asthma and other chronic lower respiratory diseases: Secondary | ICD-10-CM | POA: Diagnosis not present

## 2015-04-10 DIAGNOSIS — E785 Hyperlipidemia, unspecified: Secondary | ICD-10-CM | POA: Diagnosis not present

## 2015-04-10 DIAGNOSIS — Z8261 Family history of arthritis: Secondary | ICD-10-CM | POA: Insufficient documentation

## 2015-04-10 DIAGNOSIS — Z8489 Family history of other specified conditions: Secondary | ICD-10-CM | POA: Diagnosis not present

## 2015-04-10 DIAGNOSIS — F17200 Nicotine dependence, unspecified, uncomplicated: Secondary | ICD-10-CM | POA: Insufficient documentation

## 2015-04-10 DIAGNOSIS — Z860100 Personal history of colon polyps, unspecified: Secondary | ICD-10-CM | POA: Insufficient documentation

## 2015-04-10 DIAGNOSIS — K640 First degree hemorrhoids: Secondary | ICD-10-CM | POA: Insufficient documentation

## 2015-04-10 DIAGNOSIS — D124 Benign neoplasm of descending colon: Secondary | ICD-10-CM | POA: Diagnosis not present

## 2015-04-10 HISTORY — PX: POLYPECTOMY: SHX5525

## 2015-04-10 HISTORY — PX: COLONOSCOPY WITH PROPOFOL: SHX5780

## 2015-04-10 SURGERY — COLONOSCOPY WITH PROPOFOL
Anesthesia: Monitor Anesthesia Care | Wound class: Contaminated

## 2015-04-10 MED ORDER — SODIUM CHLORIDE 0.9 % IV SOLN: INTRAVENOUS | Status: DC

## 2015-04-10 MED ORDER — PROPOFOL 10 MG/ML IV BOLUS
INTRAVENOUS | Status: DC | PRN
  Administered 2015-04-10: 50 mg via INTRAVENOUS
  Administered 2015-04-10 (×3): 20 mg via INTRAVENOUS
  Administered 2015-04-10: 30 mg via INTRAVENOUS

## 2015-04-10 MED ORDER — STERILE WATER FOR IRRIGATION IR SOLN
Status: DC | PRN
  Administered 2015-04-10: 11:00:00

## 2015-04-10 MED ORDER — ACETAMINOPHEN 160 MG/5ML PO SOLN: 325.0000 mg | ORAL | Status: DC | PRN

## 2015-04-10 MED ORDER — ACETAMINOPHEN 325 MG PO TABS: 325.0000 mg | ORAL_TABLET | ORAL | Status: DC | PRN

## 2015-04-10 MED ORDER — LIDOCAINE HCL (CARDIAC) 20 MG/ML IV SOLN
INTRAVENOUS | Status: DC | PRN
  Administered 2015-04-10: 50 mg via INTRAVENOUS

## 2015-04-10 MED ORDER — LACTATED RINGERS IV SOLN
INTRAVENOUS | Status: DC
  Administered 2015-04-10: 10:00:00 via INTRAVENOUS

## 2015-04-10 SURGICAL SUPPLY — 28 items
CANISTER SUCT 1200ML W/VALVE (MISCELLANEOUS) ×3 IMPLANT
FCP ESCP3.2XJMB 240X2.8X (MISCELLANEOUS)
FORCEPS BIOP RAD 4 LRG CAP 4 (CUTTING FORCEPS) IMPLANT
FORCEPS BIOP RJ4 240 W/NDL (MISCELLANEOUS)
FORCEPS ESCP3.2XJMB 240X2.8X (MISCELLANEOUS) IMPLANT
GOWN CVR UNV OPN BCK APRN NK (MISCELLANEOUS) ×4 IMPLANT
GOWN ISOL THUMB LOOP REG UNIV (MISCELLANEOUS) ×2
HEMOCLIP INSTINCT (CLIP) IMPLANT
INJECTOR VARIJECT VIN23 (MISCELLANEOUS) IMPLANT
KIT CO2 TUBING (TUBING) IMPLANT
KIT DEFENDO VALVE AND CONN (KITS) IMPLANT
KIT ENDO PROCEDURE OLY (KITS) ×3 IMPLANT
LIGATOR MULTIBAND 6SHOOTER MBL (MISCELLANEOUS) IMPLANT
MARKER SPOT ENDO TATTOO 5ML (MISCELLANEOUS) IMPLANT
PAD GROUND ADULT SPLIT (MISCELLANEOUS) IMPLANT
SNARE SHORT THROW 13M SML OVAL (MISCELLANEOUS) ×3 IMPLANT
SNARE SHORT THROW 30M LRG OVAL (MISCELLANEOUS) IMPLANT
SPOT EX ENDOSCOPIC TATTOO (MISCELLANEOUS)
SUCTION POLY TRAP 4CHAMBER (MISCELLANEOUS) IMPLANT
TRAP SUCTION POLY (MISCELLANEOUS) ×3 IMPLANT
TUBING CONN 6MMX3.1M (TUBING)
TUBING SUCTION CONN 0.25 STRL (TUBING) IMPLANT
UNDERPAD 30X60 958B10 (PK) (MISCELLANEOUS) IMPLANT
VALVE BIOPSY ENDO (VALVE) IMPLANT
VARIJECT INJECTOR VIN23 (MISCELLANEOUS)
WATER AUXILLARY (MISCELLANEOUS) IMPLANT
WATER STERILE IRR 250ML POUR (IV SOLUTION) ×3 IMPLANT
WATER STERILE IRR 500ML POUR (IV SOLUTION) IMPLANT

## 2015-04-10 NOTE — Anesthesia Procedure Notes (Signed)
Procedure Name: MAC Performed by: Taysen Bushart Pre-anesthesia Checklist: Patient identified, Emergency Drugs available, Suction available, Timeout performed and Patient being monitored Patient Re-evaluated:Patient Re-evaluated prior to inductionOxygen Delivery Method: Nasal cannula Placement Confirmation: positive ETCO2       

## 2015-04-10 NOTE — Transfer of Care (Signed)
Immediate Anesthesia Transfer of Care Note  Patient: Alexandria [name]  Procedure(s) Performed: Procedure(s): COLONOSCOPY WITH PROPOFOL (N/A) POLYPECTOMY  Patient Location: PACU  Anesthesia Type: MAC  Level of Consciousness: awake, alert  and patient cooperative  Airway and Oxygen Therapy: Patient Spontanous Breathing and Patient connected to supplemental oxygen  Post-op Assessment: Post-op Vital signs reviewed, Patient's Cardiovascular Status Stable, Respiratory Function Stable, Patent Airway and No signs of Nausea or vomiting  Post-op Vital Signs: Reviewed and stable  Complications: No apparent anesthesia complications

## 2015-04-10 NOTE — Anesthesia Preprocedure Evaluation (Signed)
Anesthesia Evaluation  Patient identified by MRN, date of birth, ID band  Reviewed: Allergy & Precautions, H&P , NPO status , Patient's Chart, lab work & pertinent test results  Airway Mallampati: II  TM Distance: >3 FB Neck ROM: full    Dental  (+) Edentulous Upper, Edentulous Lower   Pulmonary shortness of breath, asthma , Current Smoker,    + wheezing      Cardiovascular hypertension, Rhythm:regular Rate:Normal     Neuro/Psych    GI/Hepatic   Endo/Other    Renal/GU      Musculoskeletal   Abdominal   Peds  Hematology   Anesthesia Other Findings   Reproductive/Obstetrics                             Anesthesia Physical Anesthesia Plan  ASA: III  Anesthesia Plan: MAC   Post-op Pain Management:    Induction:   Airway Management Planned:   Additional Equipment:   Intra-op Plan:   Post-operative Plan:   Informed Consent: I have reviewed the patients History and Physical, chart, labs and discussed the procedure including the risks, benefits and alternatives for the proposed anesthesia with the patient or authorized representative who has indicated his/her understanding and acceptance.     Plan Discussed with: CRNA  Anesthesia Plan Comments:         Anesthesia Quick Evaluation

## 2015-04-10 NOTE — Anesthesia Postprocedure Evaluation (Signed)
  Anesthesia Post-op Note  Patient: Alexandria [name]  Procedure(s) Performed: Procedure(s): COLONOSCOPY WITH PROPOFOL (N/A) POLYPECTOMY  Anesthesia type:MAC  Patient location: PACU  Post pain: Pain level controlled  Post assessment: Post-op Vital signs reviewed, Patient's Cardiovascular Status Stable, Respiratory Function Stable, Patent Airway and No signs of Nausea or vomiting  Post vital signs: Reviewed and stable  Last Vitals:  Filed Vitals:   04/10/15 1115  BP: 133/83  Pulse: 68  Temp:   Resp: 19    Level of consciousness: awake, alert  and patient cooperative  Complications: No apparent anesthesia complications

## 2015-04-10 NOTE — Op Note (Signed)
[facility] Gastroenterology Patient Name: Alexandria [name] Procedure Date: 04/10/2015 10:37 AM MRN: [id number] Account #: [id number] Date of Birth: July 28, 1956 Admit Type: Outpatient Age: 58 Room: [facility] OR ROOM 01 Gender: Female Note Status: Finalized Procedure:         Colonoscopy Indications:       High risk colon cancer surveillance: Personal history of                     colonic polyps Providers:         Lucilla Lame, MD Referring MD:      Rose Fillers. Jaynie Crumble, MD (Referring MD) Medicines:         Propofol per Anesthesia Complications:     No immediate complications. Procedure:         Pre-Anesthesia Assessment:                    - Prior to the procedure, a History and Physical was                     performed, and patient medications and allergies were                     reviewed. The patient's tolerance of previous anesthesia                     was also reviewed. The risks and benefits of the procedure                     and the sedation options and risks were discussed with the                     patient. All questions were answered, and informed consent                     was obtained. Prior Anticoagulants: The patient has taken                     no previous anticoagulant or antiplatelet agents. ASA                     Grade Assessment: II - A patient with mild systemic                     disease. After reviewing the risks and benefits, the                     patient was deemed in satisfactory condition to undergo                     the procedure.                    After obtaining informed consent, the colonoscope was                     passed under direct vision. Throughout the procedure, the                     patient's blood pressure, pulse, and oxygen saturations                     were monitored continuously. The was introduced through  the anus and advanced to the the cecum, identified by                     appendiceal orifice  and ileocecal valve. The colonoscopy                     was performed without difficulty. The patient tolerated                     the procedure well. The quality of the bowel preparation                     was excellent. Findings:      The perianal and digital rectal examinations were normal.      A 5 mm polyp was found in the sigmoid colon. The polyp was sessile. The       polyp was removed with a cold snare. Resection and retrieval were       complete.      A 5 mm polyp was found in the descending colon. The polyp was sessile.       The polyp was removed with a cold snare. Resection and retrieval were       complete.      A 4 mm polyp was found in the rectum. The polyp was sessile. The polyp       was removed with a cold snare. Resection and retrieval were complete.      Non-bleeding internal hemorrhoids were found during retroflexion. The       hemorrhoids were Grade I (internal hemorrhoids that do not prolapse).      Multiple small-mouthed diverticula were found in the sigmoid colon. Impression:        - One 5 mm polyp in the sigmoid colon. Resected and                     retrieved.                    - One 5 mm polyp in the descending colon. Resected and                     retrieved.                    - One 4 mm polyp in the rectum. Resected and retrieved.                    - Non-bleeding internal hemorrhoids.                    - Diverticulosis in the sigmoid colon. Recommendation:    - Await pathology results. Procedure Code(s): --- Professional ---                    [phone], Colonoscopy, flexible; with removal of tumor(s),                     polyp(s), or other lesion(s) by snare technique Diagnosis Code(s): --- Professional ---                    Z86.010, Personal history of colonic polyps                    D12.5, Benign neoplasm of sigmoid colon  D12.4, Benign neoplasm of descending colon                    K62.1, Rectal polyp CPT copyright 2014  American Medical Association. All rights reserved. The codes documented in this report are preliminary and upon coder review may  be revised to meet current compliance requirements. Lucilla Lame, MD 04/10/2015 10:59:58 AM This report has been signed electronically. Number of Addenda: 0 Note Initiated On: 04/10/2015 10:37 AM Scope Withdrawal Time: 0 hours 6 minutes 0 seconds  Total Procedure Duration: 0 hours 11 minutes 16 seconds       [facility]

## 2015-04-10 NOTE — H&P (Signed)
[facility]  [address] [place], [place] [address] Phone: [phone] Fax : [phone]  Primary Care Physician:  Carmon Ginsberg, [place] Primary Gastroenterologist:  Dr. Allen Norris  Pre-Procedure History & Physical: HPI:  Alexandria [name] is a 59 y.o. female is here for an colonoscopy.   Past Medical History  Diagnosis Date  . Anxiety   . Other psychological or physical stress, not elsewhere classified(V62.89)   . Methicillin resistant Staphylococcus aureus in conditions classified elsewhere and of unspecified site   . Carbuncle and furuncle of trunk   . Intrinsic asthma, unspecified   . Tobacco abuse   . HTN (hypertension)   . Hyperlipidemia     Past Surgical History  Procedure Laterality Date  . Tubal ligation      bilateral, sterile ablation  . Status post endometrial ablation    . Varicose veins    . Hysteroscopy with d&c    . Tonsillectomy      Prior to Admission medications   Medication Sig Start Date End Date Taking? Authorizing Provider  amLODipine (NORVASC) 10 MG tablet Take 1 tablet (10 mg total) by mouth daily. 02/07/15  Yes Carmon Ginsberg, PA  clonazePAM (KLONOPIN) 0.5 MG tablet Take 1 tablet (0.5 mg total) by mouth 2 (two) times daily as needed for anxiety. 02/22/15  Yes Carmon Ginsberg, PA  hydrochlorothiazide (HYDRODIURIL) 25 MG tablet Take 25 mg by mouth daily.   Yes Historical Provider, MD  MULTIPLE VITAMIN PO Take 1 tablet by mouth daily.   Yes Historical Provider, MD  pravastatin (PRAVACHOL) 40 MG tablet Take 40 mg by mouth daily.   Yes Historical Provider, MD  albuterol (PROAIR HFA) 108 (90 BASE) MCG/ACT inhaler Inhale 2 puffs into the lungs every 6 (six) hours as needed.    Historical Provider, MD    Allergies as of 03/16/2015  . (No Known Allergies)    Family History  Problem Relation Age of Onset  . Coronary artery disease Mother   . Hypertension Mother   . Rheum arthritis Mother   . Heart attack Mother   . Lung cancer Father   .  Emphysema Father   . Gout Father   . Diabetes Sister   . Hypertension Daughter   . Hypertension Daughter   . Obesity Daughter   . Seizures Daughter     History   Social History  . Marital Status: Married    Spouse Name: N/A  . Number of Children: N/A  . Years of Education: N/A   Occupational History  . Not on file.   Social History Main Topics  . Smoking status: Current Every Day Smoker -- 1.00 packs/day for 30 years  . Smokeless tobacco: Never Used  . Alcohol Use: No  . Drug Use: No  . Sexual Activity: Not on file   Other Topics Concern  . Not on file   Social History Narrative    Review of Systems: See HPI, otherwise negative ROS  Physical Exam: BP 128/86 mmHg  Pulse 71  Temp(Src) 97.5 F (36.4 C)  Resp 16  Ht 5' 7.5" (1.715 m)  Wt 147 lb (66.679 kg)  BMI 22.67 kg/m2  SpO2 98% General:   Alert,  pleasant and cooperative in NAD Head:  Normocephalic and atraumatic. Neck:  Supple; no masses or thyromegaly. Lungs:  Clear throughout to auscultation.    Heart:  Regular rate and rhythm. Abdomen:  Soft, nontender and nondistended. Normal bowel sounds, without guarding, and without rebound.   Neurologic:  Alert and  oriented x4;  grossly normal neurologically.  Impression/Plan: Alexandria [name] is here for an colonoscopy to be performed for history of colon polyps.  Risks, benefits, limitations, and alternatives regarding  colonoscopy have been reviewed with the patient.  Questions have been answered.  All parties agreeable.   Ollen Bowl, MD  04/10/2015, 10:02 AM

## 2015-04-11 ENCOUNTER — Encounter: Payer: Self-pay | Admitting: Gastroenterology

## 2015-04-12 ENCOUNTER — Encounter: Payer: Self-pay | Admitting: Gastroenterology

## 2015-04-17 ENCOUNTER — Telehealth: Payer: Self-pay | Admitting: Gastroenterology

## 2015-04-17 NOTE — Telephone Encounter (Signed)
Pt notified of results. Also informed pt a letter was mailed with results.

## 2015-04-17 NOTE — Telephone Encounter (Signed)
Results from colonoscopy please. Staying with father in law please call before 2:00. She works second shift

## 2015-04-26 ENCOUNTER — Ambulatory Visit
Admission: RE | Admit: 2015-04-26 | Discharge: 2015-04-26 | Disposition: A | Discharge status: Home / Self Care | Payer: BLUE CROSS/BLUE SHIELD | Source: Ambulatory Visit | Attending: Family Medicine | Admitting: Family Medicine

## 2015-04-26 DIAGNOSIS — Z0000 Encounter for general adult medical examination without abnormal findings: Secondary | ICD-10-CM

## 2015-04-26 DIAGNOSIS — Z1231 Encounter for screening mammogram for malignant neoplasm of breast: Secondary | ICD-10-CM | POA: Insufficient documentation

## 2015-05-30 ENCOUNTER — Other Ambulatory Visit: Payer: Self-pay | Admitting: Family Medicine

## 2015-05-30 ENCOUNTER — Telehealth: Payer: Self-pay | Admitting: Family Medicine

## 2015-05-30 NOTE — Telephone Encounter (Signed)
Refill request

## 2015-05-30 NOTE — Telephone Encounter (Addendum)
Pt contacted office for refill request on the following medications:  hydrochlorothiazide (HYDRODIURIL) 25 MG. [facility]  KD#[phone]/DX

## 2015-06-13 ENCOUNTER — Ambulatory Visit (INDEPENDENT_AMBULATORY_CARE_PROVIDER_SITE_OTHER): Payer: BLUE CROSS/BLUE SHIELD | Admitting: Family Medicine

## 2015-06-13 ENCOUNTER — Encounter: Payer: Self-pay | Admitting: Family Medicine

## 2015-06-13 VITALS — BP 134/74 | HR 90 | Temp 98.2°F | Resp 18 | Wt 156.6 lb

## 2015-06-13 DIAGNOSIS — J4521 Mild intermittent asthma with (acute) exacerbation: Secondary | ICD-10-CM | POA: Diagnosis not present

## 2015-06-13 DIAGNOSIS — J069 Acute upper respiratory infection, unspecified: Secondary | ICD-10-CM

## 2015-06-13 DIAGNOSIS — J683 Other acute and subacute respiratory conditions due to chemicals, gases, fumes and vapors: Secondary | ICD-10-CM

## 2015-06-13 MED ORDER — PREDNISONE 20 MG PO TABS: ORAL_TABLET | ORAL | Status: DC

## 2015-06-13 MED ORDER — HYDROCODONE-HOMATROPINE 5-1.5 MG/5ML PO SYRP: ORAL_SOLUTION | ORAL | Status: DC

## 2015-06-13 MED ORDER — DOXYCYCLINE HYCLATE 100 MG PO TABS: 100.0000 mg | ORAL_TABLET | Freq: Two times a day (BID) | ORAL | Status: DC

## 2015-06-13 NOTE — Patient Instructions (Signed)
Discussed use of Mucinex D for congestion, Delsym for cough, and Benadryl for postnasal drainage. If shortness of breath/wheezing do not improve start antibiotic and prednisone.

## 2015-06-13 NOTE — Progress Notes (Signed)
Subjective:     Patient ID: Alexandria [name], female   DOB: September 01, 1956, 59 y.o.   MRN: [id number]  HPI  Chief Complaint  Patient presents with  . Cough    Symptoms started on last Sunday (06/11/2015);took cough drops with little relief, also used an albuterol inhailer with no relief.  . Sore Throat    pt stated she have taken night time cold and flu on Sunday and Monday, Tylenol for fever and Robitussin for cough with very little relief.  . Headache    pain is a 7 out of 10, describes it as a pounding headache.  States she has had chills with low grade fever but no body aches. Describes increased shortness of breath and wheezing. Has decreased her smoking since being ill.   Review of Systems  Respiratory:       States she usually does not require her inhaler when she is not ill.       Objective:   Physical Exam  Constitutional: She appears well-developed and well-nourished. No distress.  Ears: T.M's intact without inflammation Throat: tonsils absent with mild posterior pharyngeal erythema Neck: no cervical adenopathy Lungs: Bilateral inspiratory and expiratory wheezes in posterior lung fields.     Assessment:    1. Upper respiratory infection - doxycycline (VIBRA-TABS) 100 MG tablet; Take 1 tablet (100 mg total) by mouth 2 (two) times daily.  Dispense: 20 tablet; Refill: 0 - HYDROcodone-homatropine (HYCODAN) 5-1.5 MG/5ML syrup; 5 ml 4-6 hours as needed for cough  Dispense: 240 mL; Refill: 0  2. Reactive airways dysfunction syndrome, mild intermittent, with acute exacerbation - doxycycline (VIBRA-TABS) 100 MG tablet; Take 1 tablet (100 mg total) by mouth 2 (two) times daily.  Dispense: 20 tablet; Refill: 0 - predniSONE (DELTASONE) 20 MG tablet; Taper as follows: 3 pills for 4 days, two pills for 4 days, one pill for four days  Dispense: 24 tablet; Refill: 0     Plan:    Discussed use of Mucinex D for congestion, Delsym for cough, and Benadryl for postnasal drainage. If shortness of  breath and wheezing worsen to start abx and prednisone. Work excuse for 10/10-10/16.

## 2015-06-16 ENCOUNTER — Telehealth: Payer: Self-pay | Admitting: Family Medicine

## 2015-06-16 MED ORDER — ALBUTEROL SULFATE HFA 108 (90 BASE) MCG/ACT IN AERS: 2.0000 | INHALATION_SPRAY | RESPIRATORY_TRACT | Status: DC | PRN

## 2015-06-16 NOTE — Telephone Encounter (Signed)
Left message to call back  

## 2015-06-16 NOTE — Telephone Encounter (Signed)
Schedule her inhaler every 4 hours if not already doing so. Stop all smoking. Continue current medication. If high fever or worsening breathing despite the above would send for a chest x-ray.

## 2015-06-16 NOTE — Telephone Encounter (Signed)
Pt stated she isn't feeling better and was advised to call in today to see if she needs to come back in for OV or try something else. Pharmacy: [facility]. Please advise. Thanks TNP

## 2015-06-16 NOTE — Telephone Encounter (Signed)
Patient feels like her cough has gotten worse. Patient reports that she has been using the cough syrup that was prescribed (Hycodan) which helps her sleep, but patient reports that she now notices a wheeze throughout the day. Patient reports that she just started the Prednisone and Doxy yesterday. I advised patient that meds will take longer than one day until she notices any improvement. Patient mentions that she felt feverish yesterday, and she has been taking Tylenol for that. Patient denies any chest pain, but does have shortness of breath. Is there anything else you recommend for the patient? Perhaps inhaler for wheezing? Please advise. Thanks!

## 2015-06-16 NOTE — Telephone Encounter (Signed)
Advised patient as below. Patient reports that she will start taking inhaler. Patient reports that she will call office if she develops a high fever. Patient reports that fevers usually happen at night.

## 2015-06-17 ENCOUNTER — Emergency Department: Payer: BLUE CROSS/BLUE SHIELD

## 2015-06-17 ENCOUNTER — Emergency Department
Admission: EM | Admit: 2015-06-17 | Discharge: 2015-06-17 | Disposition: A | Discharge status: Home / Self Care | Payer: BLUE CROSS/BLUE SHIELD | Attending: Emergency Medicine | Admitting: Emergency Medicine

## 2015-06-17 ENCOUNTER — Encounter: Payer: Self-pay | Admitting: Emergency Medicine

## 2015-06-17 DIAGNOSIS — I10 Essential (primary) hypertension: Secondary | ICD-10-CM | POA: Insufficient documentation

## 2015-06-17 DIAGNOSIS — J40 Bronchitis, not specified as acute or chronic: Secondary | ICD-10-CM

## 2015-06-17 DIAGNOSIS — Z79899 Other long term (current) drug therapy: Secondary | ICD-10-CM | POA: Insufficient documentation

## 2015-06-17 DIAGNOSIS — Z7952 Long term (current) use of systemic steroids: Secondary | ICD-10-CM | POA: Insufficient documentation

## 2015-06-17 DIAGNOSIS — E876 Hypokalemia: Secondary | ICD-10-CM | POA: Diagnosis not present

## 2015-06-17 DIAGNOSIS — Z792 Long term (current) use of antibiotics: Secondary | ICD-10-CM | POA: Diagnosis not present

## 2015-06-17 DIAGNOSIS — R05 Cough: Secondary | ICD-10-CM | POA: Diagnosis present

## 2015-06-17 DIAGNOSIS — Z720 Tobacco use: Secondary | ICD-10-CM | POA: Diagnosis not present

## 2015-06-17 DIAGNOSIS — R059 Cough, unspecified: Secondary | ICD-10-CM

## 2015-06-17 DIAGNOSIS — J209 Acute bronchitis, unspecified: Secondary | ICD-10-CM | POA: Insufficient documentation

## 2015-06-17 LAB — CBC WITH DIFFERENTIAL/PLATELET
Basophils Absolute: 0 10*3/uL (ref 0–0.1)
Basophils Relative: 0 %
EOS ABS: 0 10*3/uL (ref 0–0.7)
Eosinophils Relative: 0 %
HEMATOCRIT: 43.9 % (ref 35.0–47.0)
HEMOGLOBIN: 15.2 g/dL (ref 12.0–16.0)
LYMPHS ABS: 2.1 10*3/uL (ref 1.0–3.6)
LYMPHS PCT: 19 %
MCH: 29.5 pg (ref 26.0–34.0)
MCHC: 34.7 g/dL (ref 32.0–36.0)
MCV: 84.9 fL (ref 80.0–100.0)
MONOS PCT: 5 %
Monocytes Absolute: 0.5 10*3/uL (ref 0.2–0.9)
Neutro Abs: 8.1 10*3/uL — ABNORMAL HIGH (ref 1.4–6.5)
Neutrophils Relative %: 76 %
Platelets: 218 10*3/uL (ref 150–440)
RBC: 5.16 MIL/uL (ref 3.80–5.20)
RDW: 13.4 % (ref 11.5–14.5)
WBC: 10.8 10*3/uL (ref 3.6–11.0)

## 2015-06-17 LAB — BASIC METABOLIC PANEL
Anion gap: 10 (ref 5–15)
BUN: 11 mg/dL (ref 6–20)
CHLORIDE: 97 mmol/L — AB (ref 101–111)
CO2: 27 mmol/L (ref 22–32)
Calcium: 9.9 mg/dL (ref 8.9–10.3)
Creatinine, Ser: 0.64 mg/dL (ref 0.44–1.00)
GFR calc Af Amer: >60 mL/min (ref >60)
GFR calc non Af Amer: >60 mL/min (ref >60)
Glucose, Bld: 104 mg/dL — ABNORMAL HIGH (ref 65–99)
Potassium: 2.8 mmol/L — CL (ref 3.5–5.1)
Sodium: 134 mmol/L — ABNORMAL LOW (ref 135–145)

## 2015-06-17 MED ORDER — POTASSIUM CHLORIDE CRYS ER 20 MEQ PO TBCR
40.0000 meq | EXTENDED_RELEASE_TABLET | Freq: Once | ORAL | Status: AC
  Administered 2015-06-17: 40 meq via ORAL
  Filled 2015-06-17: qty 2

## 2015-06-17 MED ORDER — IPRATROPIUM-ALBUTEROL 0.5-2.5 (3) MG/3ML IN SOLN
RESPIRATORY_TRACT | Status: AC
  Administered 2015-06-17: 3 mL via RESPIRATORY_TRACT
  Filled 2015-06-17: qty 3

## 2015-06-17 MED ORDER — IPRATROPIUM-ALBUTEROL 0.5-2.5 (3) MG/3ML IN SOLN
3.0000 mL | Freq: Once | RESPIRATORY_TRACT | Status: AC
  Administered 2015-06-17: 3 mL via RESPIRATORY_TRACT
  Filled 2015-06-17: qty 3

## 2015-06-17 MED ORDER — SODIUM CHLORIDE 0.9 % IV BOLUS (SEPSIS)
1000.0000 mL | Freq: Once | INTRAVENOUS | Status: AC
  Administered 2015-06-17: 1000 mL via INTRAVENOUS

## 2015-06-17 MED ORDER — BENZONATATE 100 MG PO CAPS: 100.0000 mg | ORAL_CAPSULE | Freq: Four times a day (QID) | ORAL | Status: DC | PRN

## 2015-06-17 NOTE — ED Provider Notes (Signed)
[facility] Emergency Department Provider Note    ____________________________________________  Time seen: 1340  I have reviewed the triage vital signs and the nursing notes.   HISTORY  Chief Complaint URI and Weakness   History limited by: Not Limited   HPI Alexandria [name] is a 59 y.o. female who presents to the emergency department with almost a one-week history of cough, congestion and intermittent fever. Patient states that soon after the symptoms started she saw her primary care doctor. Was prescribed an antibiotic. Additionally she has been prescribed an inhaler and narcotic cough medication. She states that the narcotic cough medication and has let her sleep. She has been using the inhaler as well as steroids to help with her breathing. This is only helped mildly. The patient states that the Vioxx did not seem to be helping. She does have a history of bronchitis. States she once had a history of pneumonia many years ago.   Past Medical History  Diagnosis Date  . Anxiety   . Other psychological or physical stress, not elsewhere classified(V62.89)   . Methicillin resistant Staphylococcus aureus in conditions classified elsewhere and of unspecified site   . Carbuncle and furuncle of trunk   . Intrinsic asthma, unspecified   . Tobacco abuse   . HTN (hypertension)   . Hyperlipidemia     Patient Active Problem List   Diagnosis Date Noted  . Hx of colonic polyps   . Benign neoplasm of sigmoid colon   . Benign neoplasm of descending colon   . Rectal polyp   . History of anxiety 03/10/2015  . Tobacco abuse   . HTN (hypertension)   . Hyperlipidemia     Past Surgical History  Procedure Laterality Date  . Tubal ligation      bilateral, sterile ablation  . Status post endometrial ablation    . Varicose veins    . Tonsillectomy    . Colonoscopy with propofol N/A 04/10/2015    Procedure: COLONOSCOPY WITH PROPOFOL;  Surgeon: Lucilla Lame, MD;  Location:  [facility];  Service: Endoscopy;  Laterality: N/A;  . Polypectomy  04/10/2015    Procedure: POLYPECTOMY;  Surgeon: Lucilla Lame, MD;  Location: [place];  Service: Endoscopy;;    Current Outpatient Rx  Name  Route  Sig  Dispense  Refill  . albuterol (PROAIR HFA) 108 (90 BASE) MCG/ACT inhaler   Inhalation   Inhale 2 puffs into the lungs every 4 (four) hours as needed.   1 Inhaler   0   . amLODipine (NORVASC) 10 MG tablet   Oral   Take 1 tablet (10 mg total) by mouth daily.   90 tablet   3   . clonazePAM (KLONOPIN) 0.5 MG tablet   Oral   Take 1 tablet (0.5 mg total) by mouth 2 (two) times daily as needed for anxiety.   60 tablet   2   . doxycycline (VIBRA-TABS) 100 MG tablet   Oral   Take 1 tablet (100 mg total) by mouth 2 (two) times daily.   20 tablet   0   . hydrochlorothiazide (HYDRODIURIL) 25 MG tablet      TAKE ONE TABLET BY MOUTH ONCE DAILY   30 tablet   5   . HYDROcodone-homatropine (HYCODAN) 5-1.5 MG/5ML syrup      5 ml 4-6 hours as needed for cough   240 mL   0   . MULTIPLE VITAMIN PO   Oral   Take 1 tablet  by mouth daily.         . pravastatin (PRAVACHOL) 40 MG tablet   Oral   Take 40 mg by mouth daily.         . predniSONE (DELTASONE) 20 MG tablet      Taper as follows: 3 pills for 4 days, two pills for 4 days, one pill for four days   24 tablet   0     Allergies Review of patient's allergies indicates no known allergies.  Family History  Problem Relation Age of Onset  . Coronary artery disease Mother   . Hypertension Mother   . Rheum arthritis Mother   . Heart attack Mother   . Lung cancer Father   . Emphysema Father   . Gout Father   . Diabetes Sister   . Hypertension Daughter   . Hypertension Daughter   . Obesity Daughter   . Seizures Daughter     Social History Social History  Substance Use Topics  . Smoking status: Current Every Day Smoker -- 0.50 packs/day for 30 years    Types: Cigarettes  .  Smokeless tobacco: Never Used  . Alcohol Use: No    Review of Systems  Constitutional: Positive for intermittent fever. Cardiovascular: Negative for chest pain. Respiratory: Positive for shortness breath and cough Gastrointestinal: Negative for abdominal pain, vomiting and diarrhea. Genitourinary: Negative for dysuria. Musculoskeletal: Negative for back pain. Skin: Negative for rash. Neurological: Negative for headaches, focal weakness or numbness. 10-point ROS otherwise negative.  ____________________________________________   PHYSICAL EXAM:  VITAL SIGNS: ED Triage Vitals  Enc Vitals Group     BP 06/17/15 1327 129/70 mmHg     Pulse Rate 06/17/15 1327 82     Resp 06/17/15 1327 20     Temp 06/17/15 1327 97.5 F (36.4 C)     Temp Source 06/17/15 1327 Oral     SpO2 06/17/15 1327 98 %     Weight 06/17/15 1327 156 lb (70.761 kg)     Height 06/17/15 1327 5\' 7"  (1.702 m)     Head Cir --      Peak Flow --      Pain Score 06/17/15 1328 8   Constitutional: Alert and oriented. Well appearing and in no distress. Eyes: Conjunctivae are normal. PERRL. Normal extraocular movements. ENT   Head: Normocephalic and atraumatic.   Nose: No congestion/rhinnorhea.   Mouth/Throat: Mucous membranes are moist.   Neck: No stridor. Hematological/Lymphatic/Immunilogical: No cervical lymphadenopathy. Cardiovascular: Normal rate, regular rhythm.  No murmurs, rubs, or gallops. Respiratory: Normal respiratory effort without tachypnea nor retractions. Mild wheezing in bilateral lungs. Gastrointestinal: Soft and nontender. No distention. There is no CVA tenderness. Genitourinary: Deferred Musculoskeletal: Normal range of motion in all extremities. No joint effusions.  No lower extremity tenderness nor edema. Neurologic:  Normal speech and language. No gross focal neurologic deficits are appreciated. Speech is normal.  Skin:  Skin is warm, dry and intact. No rash noted. Psychiatric: Mood  and affect are normal. Speech and behavior are normal. Patient exhibits appropriate insight and judgment.  ____________________________________________    LABS (pertinent positives/negatives)  Labs Reviewed  CBC WITH DIFFERENTIAL/PLATELET - Abnormal; Notable for the following:    Neutro Abs 8.1 (*)    All other components within normal limits  BASIC METABOLIC PANEL - Abnormal; Notable for the following:    Sodium 134 (*)    Potassium 2.8 (*)    Chloride 97 (*)    Glucose, Bld 104 (*)  All other components within normal limits     ____________________________________________   EKG  None  ____________________________________________    RADIOLOGY  Chest x-ray IMPRESSION: 1. Mild diffuse peribronchial cuffing may suggest an acute bronchitis. I, Roark Rufo, personally viewed and evaluated these images (plain radiographs) as part of my medical decision making. ____________________________________________   PROCEDURES  Procedure(s) performed: None  Critical Care performed: No  ____________________________________________   INITIAL IMPRESSION / ASSESSMENT AND PLAN / ED COURSE  Pertinent labs & imaging results that were available during my care of the patient were reviewed by me and considered in my medical decision making (see chart for details).  Patient presented to the emergency department from [place] one week of coughing and congestion. Chest x-ray consistent with bronchitis. Blood work without any concerning findings save for a low potassium. I did replete the patient's potassium as well as give IV fluids. I think that might contribute to the patient's sense of weakness. She was given some DuoNeb's whilst here in the emergency department. Will give patient prescription for [facility] as she has a less sedating cough medication. Instructed patient follow-up with primary care doctor.  ____________________________________________   FINAL CLINICAL  IMPRESSION(S) / ED DIAGNOSES  Final diagnoses:  Bronchitis  Cough  Hypokalemia     Nance Pear, MD 06/17/15 1512

## 2015-06-17 NOTE — ED Notes (Signed)
Patient reports having cough, nasal drainage, fever since 10/9. 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 Has been unable to work d/t illness. Patient reports seeing PCP on Tuesday and being prescribed antibiotics. Was told she had URI. States had antibiotic filled on Thursday and has not been seeing any relief. Patient states yesterday she had inhaler called in for her d/t wheezing. States has provided minimal relief. States she now feels weak, particularly in her legs, and has no appetite.

## 2015-06-17 NOTE — ED Notes (Signed)
CRITICAL VALUE ALERT  Critical value received:  K 2.8  Date of notification:  06/17/2015  Time of notification:  1419  Critical value read back:Yes.    Nurse who received alert:  Waymon Amato  MD notified (1st page):  MD GOODMAN  Time of first page:  1419  MD notified (2nd page):  Time of second page:  Responding MD:  Archie Balboa  Time MD responded:  1419

## 2015-06-17 NOTE — Discharge Instructions (Signed)
Please seek medical attention for any high fevers, chest pain, shortness of breath, change in behavior, persistent vomiting, bloody stool or any other new or concerning symptoms. ° ° °Upper Respiratory Infection, Adult °Most upper respiratory infections (URIs) are a viral infection of the air passages leading to the lungs. A URI affects the nose, throat, and upper air passages. The most common type of URI is nasopharyngitis and is typically referred to as "the common cold." °URIs run their course and usually go away on their own. Most of the time, a URI does not require medical attention, but sometimes a bacterial infection in the upper airways can follow a viral infection. This is called a secondary infection. Sinus and middle ear infections are common types of secondary upper respiratory infections. °Bacterial pneumonia can also complicate a URI. A URI can worsen asthma and chronic obstructive pulmonary disease (COPD). Sometimes, these complications can require emergency medical care and may be life threatening.  °CAUSES °Almost all URIs are caused by viruses. A virus is a type of germ and can spread from one person to another.  °RISKS FACTORS °You may be at risk for a URI if:  °· You smoke.   °· You have chronic heart or lung disease. °· You have a weakened defense (immune) system.   °· You are very young or very old.   °· You have nasal allergies or asthma. °· You work in crowded or poorly ventilated areas. °· You work in health care facilities or schools. °SIGNS AND SYMPTOMS  °Symptoms typically develop 2-3 days after you come in contact with a cold virus. Most viral URIs last 7-10 days. However, viral URIs from the influenza virus (flu virus) can last 14-18 days and are typically more severe. Symptoms may include:  °· Runny or stuffy (congested) nose.   °· Sneezing.   °· Cough.   °· Sore throat.   °· Headache.   °· Fatigue.   °· Fever.   °· Loss of appetite.   °· Pain in your forehead, behind your eyes, and  over your cheekbones (sinus pain). °· Muscle aches.   °DIAGNOSIS  °Your health care provider may diagnose a URI by: °· Physical exam. °· Tests to check that your symptoms are not due to another condition such as: °¨ Strep throat. °¨ Sinusitis. °¨ Pneumonia. °¨ Asthma. °TREATMENT  °A URI goes away on its own with time. It cannot be cured with medicines, but medicines may be prescribed or recommended to relieve symptoms. Medicines may help: °· Reduce your fever. °· Reduce your cough. °· Relieve nasal congestion. °HOME CARE INSTRUCTIONS  °· Take medicines only as directed by your health care provider.   °· Gargle warm saltwater or take cough drops to comfort your throat as directed by your health care provider. °· Use a warm mist humidifier or inhale steam from a shower to increase air moisture. This may make it easier to breathe. °· Drink enough fluid to keep your urine clear or pale yellow.   °· Eat soups and other clear broths and maintain good nutrition.   °· Rest as needed.   °· Return to work when your temperature has returned to normal or as your health care provider advises. You may need to stay home longer to avoid infecting others. You can also use a face mask and careful hand washing to prevent spread of the virus. °· Increase the usage of your inhaler if you have asthma.   °· Do not use any tobacco products, including cigarettes, chewing tobacco, or electronic cigarettes. If you need help quitting, ask your health care   provider. °PREVENTION  °The best way to protect yourself from getting a cold is to practice good hygiene.  °· Avoid oral or hand contact with people with cold symptoms.   °· Wash your hands often if contact occurs.   °There is no clear evidence that vitamin C, vitamin E, echinacea, or exercise reduces the chance of developing a cold. However, it is always recommended to get plenty of rest, exercise, and practice good nutrition.  °SEEK MEDICAL CARE IF:  °· You are getting worse rather than  better.   °· Your symptoms are not controlled by medicine.   °· You have chills. °· You have worsening shortness of breath. °· You have brown or red mucus. °· You have yellow or brown nasal discharge. °· You have pain in your face, especially when you bend forward. °· You have a fever. °· You have swollen neck glands. °· You have pain while swallowing. °· You have white areas in the back of your throat. °SEEK IMMEDIATE MEDICAL CARE IF:  °· You have severe or persistent: °¨ Headache. °¨ Ear pain. °¨ Sinus pain. °¨ Chest pain. °· You have chronic lung disease and any of the following: °¨ Wheezing. °¨ Prolonged cough. °¨ Coughing up blood. °¨ A change in your usual mucus. °· You have a stiff neck. °· You have changes in your: °¨ Vision. °¨ Hearing. °¨ Thinking. °¨ Mood. °MAKE SURE YOU:  °· Understand these instructions. °· Will watch your condition. °· Will get help right away if you are not doing well or get worse. °  °This information is not intended to replace advice given to you by your health care provider. Make sure you discuss any questions you have with your health care provider. °  °Document Released: 02/12/2001 Document Revised: 01/03/2015 Document Reviewed: 11/24/2013 °Elsevier Interactive Patient Education ©2016 [employer] ° °

## 2015-06-19 NOTE — Telephone Encounter (Signed)
Advised pt's daughter as directed. Pt's daughter verbalized fully understanding.  Thanks,

## 2015-06-19 NOTE — Telephone Encounter (Signed)
Pt's daughter, Overton Mam stated that her mom Alexandria [name] wants to know if she can get a note for work, she stated that her mom went to the ER on Saturday and that she is still feels Short of Breath, she wants a Note for Work to be off Today and Tomorrow, and since she is off on Wednesday, she can go back to work on Thursday, 06/22/2015.  Thanks,

## 2015-06-19 NOTE — Telephone Encounter (Signed)
Work excuse written, please let patient know (or daughter if she is picking up)

## 2015-07-13 ENCOUNTER — Telehealth: Payer: Self-pay

## 2015-07-13 ENCOUNTER — Other Ambulatory Visit: Payer: Self-pay | Admitting: Family Medicine

## 2015-07-13 DIAGNOSIS — F419 Anxiety disorder, unspecified: Secondary | ICD-10-CM

## 2015-07-13 MED ORDER — CLONAZEPAM 0.5 MG PO TABS: 0.5000 mg | ORAL_TABLET | Freq: Two times a day (BID) | ORAL | Status: DC | PRN

## 2015-07-13 NOTE — Telephone Encounter (Signed)
Left message for patient notifying her that Rx is available for pick up

## 2015-07-13 NOTE — Telephone Encounter (Signed)
Patient is requesting a refill on Clonazepam be called in at [place].

## 2015-10-30 ENCOUNTER — Other Ambulatory Visit: Payer: Self-pay | Admitting: Family Medicine

## 2015-11-26 ENCOUNTER — Other Ambulatory Visit: Payer: Self-pay | Admitting: Family Medicine

## 2015-12-28 ENCOUNTER — Telehealth: Payer: Self-pay | Admitting: Family Medicine

## 2015-12-28 ENCOUNTER — Other Ambulatory Visit: Payer: Self-pay | Admitting: Family Medicine

## 2015-12-28 DIAGNOSIS — I10 Essential (primary) hypertension: Secondary | ICD-10-CM

## 2015-12-28 MED ORDER — HYDROCHLOROTHIAZIDE 25 MG PO TABS: ORAL_TABLET | ORAL | Status: DC

## 2015-12-28 NOTE — Telephone Encounter (Signed)
error 

## 2016-01-02 ENCOUNTER — Other Ambulatory Visit: Payer: Self-pay | Admitting: Family Medicine

## 2016-01-26 ENCOUNTER — Other Ambulatory Visit: Payer: Self-pay | Admitting: Family Medicine

## 2016-03-13 ENCOUNTER — Encounter: Payer: Self-pay | Admitting: Family Medicine

## 2016-03-13 ENCOUNTER — Ambulatory Visit (INDEPENDENT_AMBULATORY_CARE_PROVIDER_SITE_OTHER): Payer: BLUE CROSS/BLUE SHIELD | Admitting: Family Medicine

## 2016-03-13 ENCOUNTER — Other Ambulatory Visit: Payer: Self-pay | Admitting: Family Medicine

## 2016-03-13 VITALS — BP 128/70 | HR 76 | Temp 97.6°F | Resp 20 | Ht 67.0 in | Wt 161.0 lb

## 2016-03-13 DIAGNOSIS — I10 Essential (primary) hypertension: Secondary | ICD-10-CM

## 2016-03-13 DIAGNOSIS — L989 Disorder of the skin and subcutaneous tissue, unspecified: Secondary | ICD-10-CM | POA: Diagnosis not present

## 2016-03-13 DIAGNOSIS — Z0000 Encounter for general adult medical examination without abnormal findings: Secondary | ICD-10-CM | POA: Diagnosis not present

## 2016-03-13 DIAGNOSIS — Z8659 Personal history of other mental and behavioral disorders: Secondary | ICD-10-CM

## 2016-03-13 DIAGNOSIS — M791 Myalgia, unspecified site: Secondary | ICD-10-CM

## 2016-03-13 DIAGNOSIS — E785 Hyperlipidemia, unspecified: Secondary | ICD-10-CM | POA: Diagnosis not present

## 2016-03-13 DIAGNOSIS — R350 Frequency of micturition: Secondary | ICD-10-CM | POA: Diagnosis not present

## 2016-03-13 LAB — POCT URINALYSIS DIPSTICK
Bilirubin, UA: NEGATIVE
Blood, UA: NEGATIVE
Glucose, UA: NEGATIVE
KETONES UA: NEGATIVE
LEUKOCYTES UA: NEGATIVE
Nitrite, UA: NEGATIVE
PH UA: 7
Protein, UA: NEGATIVE
Spec Grav, UA: 1.01
UROBILINOGEN UA: 0.2

## 2016-03-13 NOTE — Progress Notes (Signed)
Subjective:     Patient ID: Alexandria [name], female   DOB: 1956/02/07, 60 y.o.   MRN: [id number]  HPI  Chief Complaint  Patient presents with  . Annual Exam    Pt reports she is feeling poorly due to myalgias/arthralgias. Is sleeping poorly. Last CPE was 03/10/2015. Prevnar 13 and Zoster given at that time. Last colonoscopy was 04/10/2015 with Dr. Allen Norris. Showed diverticulosis, non-bleeding internal hemorrhoids. Tubular adenoma and hyperplastic polyps.  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 States the work is probably contributing to her muscular/articular pain as well as stress. She plans to retire @ 52.   Review of Systems General: Has not had much success with smoking cessation-states insurance does not pay for rx medication. Suggested trying nicotine products-both gum and the patch together. HEENT: edentulous Cardiovascular: no chest pain or palpitations. Shortness of breath/wheezing reported when laying down. GI: no heartburn, no change in bowel habits GU: reports nocturnal urinary frequency. Will be due 5 year pap/HPV next year. Psychiatric:clonazepam controlling anxiety. Denies depression Musculoskeletal: right shoulder pain from lifting at work, muscle aches in thighs.    Objective:   Physical Exam  Constitutional: She appears well-developed and well-nourished. No distress.  Eyes: PERRLA, EOMI Neck: no thyromegaly, tenderness or nodules, no carotid bruits or cervical adenopathy ENT: TM's intact without inflammation; tonsils absent Lungs: Clear Breasts: no mass or nipple discharge; no axillary adenopathy Heart : RRR without murmur or gallop Abd: bowel sounds present, soft, non-tender, no organomegaly Extremities: no edema Skin: dark figure of eight shaped lesion in pubic area.     Assessment:    1. Annual physical exam  2. Essential hypertension: continue current medication - Comprehensive metabolic panel  3. Hyperlipidemia: continue current medication - Lipid panel  4.  History of anxiety: continue clonazepam  5. Myalgia: consider trial off statin drug - Sedimentation rate - CK (Creatine Kinase)  6. Urinary frequency: consider overactive bladder - POCT urinalysis dipstick  7. Enlarging skin lesion - Ambulatory referral to Dermatology 8.Tobacco abuse    Plan:    Schedule mammogram after 8/24. Discussed nicotine products. Further f/u pending lab work.

## 2016-03-13 NOTE — Patient Instructions (Addendum)
Schedule a mammogram after August 24. We will call you with the lab results and the dermatology referral. Try both a nicotine patch and gum to try and stop smoking.

## 2016-03-14 ENCOUNTER — Telehealth: Payer: Self-pay

## 2016-03-14 ENCOUNTER — Other Ambulatory Visit: Payer: Self-pay | Admitting: Family Medicine

## 2016-03-14 DIAGNOSIS — R350 Frequency of micturition: Secondary | ICD-10-CM

## 2016-03-14 LAB — COMPREHENSIVE METABOLIC PANEL
ALBUMIN: 4.6 g/dL (ref 3.6–4.8)
ALK PHOS: 89 IU/L (ref 39–117)
ALT: 12 IU/L (ref 0–32)
AST: 13 IU/L (ref 0–40)
Albumin/Globulin Ratio: 2.1 (ref 1.2–2.2)
BUN / CREAT RATIO: 10 — AB (ref 12–28)
BUN: 7 mg/dL — ABNORMAL LOW (ref 8–27)
Bilirubin Total: 0.5 mg/dL (ref 0.0–1.2)
CO2: 29 mmol/L (ref 18–29)
Calcium: 10.3 mg/dL (ref 8.7–10.3)
Chloride: 98 mmol/L (ref 96–106)
Creatinine, Ser: 0.71 mg/dL (ref 0.57–1.00)
GFR calc Af Amer: 106 mL/min/{1.73_m2} (ref >59)
GFR calc non Af Amer: 92 mL/min/{1.73_m2} (ref >59)
GLUCOSE: 95 mg/dL (ref 65–99)
Globulin, Total: 2.2 g/dL (ref 1.5–4.5)
Potassium: 3.7 mmol/L (ref 3.5–5.2)
SODIUM: 142 mmol/L (ref 134–144)
Total Protein: 6.8 g/dL (ref 6.0–8.5)

## 2016-03-14 LAB — LIPID PANEL
CHOLESTEROL TOTAL: 179 mg/dL (ref 100–199)
Chol/HDL Ratio: 4 ratio units (ref 0.0–4.4)
HDL: 45 mg/dL (ref >39)
LDL CALC: 106 mg/dL — AB (ref 0–99)
Triglycerides: 140 mg/dL (ref 0–149)
VLDL CHOLESTEROL CAL: 28 mg/dL (ref 5–40)

## 2016-03-14 LAB — SEDIMENTATION RATE: Sed Rate: 2 mm/hr (ref 0–40)

## 2016-03-14 LAB — CK: Total CK: 113 U/L (ref 24–173)

## 2016-03-14 MED ORDER — OXYBUTYNIN CHLORIDE ER 10 MG PO TB24: 10.0000 mg | ORAL_TABLET | Freq: Every day | ORAL | Status: DC

## 2016-03-14 NOTE — Addendum Note (Signed)
Addended by: Silvio Clayman on: 03/14/2016 10:59 AM   Modules accepted: Miquel Dunn

## 2016-03-14 NOTE — Telephone Encounter (Signed)
Have sent in Ditropan Xl 10 mg.-one pill at bedtime

## 2016-03-14 NOTE — Telephone Encounter (Signed)
Agrees to start medication. [facility]. Renaldo Fiddler, CMA

## 2016-03-14 NOTE — Telephone Encounter (Signed)
-----   Message from Carmon Ginsberg, [place] sent at 03/14/2016 10:28 AM EDT ----- Labs look ok. Let's try having her stop the pravastatin for a week or so and see if that helps her aches. Does she want to try a medication at night for overactive bladder?

## 2016-03-21 ENCOUNTER — Other Ambulatory Visit: Payer: Self-pay | Admitting: Family Medicine

## 2016-03-26 ENCOUNTER — Other Ambulatory Visit: Payer: Self-pay | Admitting: Family Medicine

## 2016-03-26 MED ORDER — AMLODIPINE BESYLATE 10 MG PO TABS
10.0000 mg | ORAL_TABLET | Freq: Every day | ORAL | 3 refills | Status: DC
Start: 2016-03-26 — End: 2017-03-17

## 2016-04-10 ENCOUNTER — Telehealth: Payer: Self-pay | Admitting: Family Medicine

## 2016-04-10 NOTE — Telephone Encounter (Signed)
Pt contacted office for refill request on the following medications:  oxybutynin (DITROPAN-XL) 10 MG 24 hr tablet.  [place]  [id number]

## 2016-04-11 ENCOUNTER — Other Ambulatory Visit: Payer: Self-pay | Admitting: Family Medicine

## 2016-04-11 DIAGNOSIS — R350 Frequency of micturition: Secondary | ICD-10-CM

## 2016-04-11 MED ORDER — OXYBUTYNIN CHLORIDE ER 10 MG PO TB24: 10.0000 mg | ORAL_TABLET | Freq: Every day | ORAL | 5 refills | Status: DC

## 2016-04-11 NOTE — Telephone Encounter (Signed)
Ditropan sent in

## 2016-04-11 NOTE — Telephone Encounter (Signed)
[id number] 

## 2016-04-11 NOTE — Telephone Encounter (Signed)
Patient has been advised. KW 

## 2016-05-24 ENCOUNTER — Other Ambulatory Visit: Payer: Self-pay | Admitting: Family Medicine

## 2016-05-24 DIAGNOSIS — Z1231 Encounter for screening mammogram for malignant neoplasm of breast: Secondary | ICD-10-CM

## 2016-06-19 ENCOUNTER — Ambulatory Visit
Admission: RE | Admit: 2016-06-19 | Discharge: 2016-06-19 | Disposition: A | Discharge status: Home / Self Care | Payer: BLUE CROSS/BLUE SHIELD | Source: Ambulatory Visit | Attending: Family Medicine | Admitting: Family Medicine

## 2016-06-19 DIAGNOSIS — Z1231 Encounter for screening mammogram for malignant neoplasm of breast: Secondary | ICD-10-CM | POA: Insufficient documentation

## 2016-06-25 ENCOUNTER — Other Ambulatory Visit: Payer: Self-pay | Admitting: Family Medicine

## 2016-08-27 ENCOUNTER — Other Ambulatory Visit: Payer: Self-pay | Admitting: Family Medicine

## 2016-08-27 NOTE — Telephone Encounter (Signed)
Prescription has been called into pharmacy 

## 2016-09-17 ENCOUNTER — Ambulatory Visit (INDEPENDENT_AMBULATORY_CARE_PROVIDER_SITE_OTHER): Payer: BLUE CROSS/BLUE SHIELD | Admitting: Family Medicine

## 2016-09-17 ENCOUNTER — Encounter: Payer: Self-pay | Admitting: Family Medicine

## 2016-09-17 VITALS — BP 122/72 | HR 77 | Temp 97.5°F | Resp 18 | Wt 162.4 lb

## 2016-09-17 DIAGNOSIS — J40 Bronchitis, not specified as acute or chronic: Secondary | ICD-10-CM

## 2016-09-17 MED ORDER — ALBUTEROL SULFATE HFA 108 (90 BASE) MCG/ACT IN AERS: 2.0000 | INHALATION_SPRAY | Freq: Four times a day (QID) | RESPIRATORY_TRACT | 5 refills | Status: DC | PRN

## 2016-09-17 MED ORDER — CEFDINIR 300 MG PO CAPS
300.0000 mg | ORAL_CAPSULE | Freq: Two times a day (BID) | ORAL | 0 refills | Status: DC
Start: 2016-09-17 — End: 2017-01-29

## 2016-09-17 MED ORDER — HYDROCODONE-HOMATROPINE 5-1.5 MG/5ML PO SYRP
ORAL_SOLUTION | ORAL | 0 refills | Status: DC
Start: 2016-09-17 — End: 2016-10-15

## 2016-09-17 NOTE — Progress Notes (Signed)
Subjective:     Patient ID: Alexandria [name], female   DOB: [birth year], 61 y.o.   MRN: [id number]  HPI  Chief Complaint  Patient presents with  . Cough    Patient comes in office today with concerns of cough and shortness of breath since 09/15/16. Patient reports that cough is productive of green sputum and she has had nausea. Patient states that she has taken otc Mucines, NighttimeCold, Aleve and Tylenol  Reports one week + of cold symptoms before cough became worse. Has continued to smoke while ill. Does not have an albuterol inhaler at this time.   Review of Systems     Objective:   Physical Exam  Constitutional: She appears well-developed and well-nourished. No distress.  Ears: T.M's intact without inflammation Sinuses: non-tender Throat: tonsils absent Neck: no cervical adenopathy Lungs: clear     Assessment:    1. Bronchitis - albuterol (PROVENTIL HFA;VENTOLIN HFA) 108 (90 Base) MCG/ACT inhaler; Inhale 2 puffs into the lungs every 6 (six) hours as needed for wheezing or shortness of breath.  Dispense: 1 Inhaler; Refill: 5 - cefdinir (OMNICEF) 300 MG capsule; Take 1 capsule (300 mg total) by mouth 2 (two) times daily.  Dispense: 20 capsule; Refill: 0 - HYDROcodone-homatropine (HYCODAN) 5-1.5 MG/5ML syrup; 5 ml 4-6 hours as needed for cough  Dispense: 240 mL; Refill: 0    Plan:   Discussed use of Mucinex D. Will call if breathing not improving with the above for addition of prednisone.

## 2016-09-17 NOTE — Patient Instructions (Signed)
Add Mucinex D for congestion.

## 2016-10-15 ENCOUNTER — Encounter: Payer: Self-pay | Admitting: Family Medicine

## 2016-10-15 ENCOUNTER — Ambulatory Visit (INDEPENDENT_AMBULATORY_CARE_PROVIDER_SITE_OTHER): Payer: BLUE CROSS/BLUE SHIELD | Admitting: Family Medicine

## 2016-10-15 VITALS — BP 132/72 | HR 72 | Temp 97.8°F | Resp 16 | Wt 160.0 lb

## 2016-10-15 DIAGNOSIS — J069 Acute upper respiratory infection, unspecified: Secondary | ICD-10-CM

## 2016-10-15 DIAGNOSIS — B9789 Other viral agents as the cause of diseases classified elsewhere: Secondary | ICD-10-CM | POA: Diagnosis not present

## 2016-10-15 DIAGNOSIS — R062 Wheezing: Secondary | ICD-10-CM | POA: Diagnosis not present

## 2016-10-15 MED ORDER — ALBUTEROL SULFATE HFA 108 (90 BASE) MCG/ACT IN AERS: 2.0000 | INHALATION_SPRAY | RESPIRATORY_TRACT | 5 refills | Status: DC | PRN

## 2016-10-15 MED ORDER — HYDROCODONE-HOMATROPINE 5-1.5 MG/5ML PO SYRP
ORAL_SOLUTION | ORAL | 0 refills | Status: DC
Start: 2016-10-15 — End: 2017-01-29

## 2016-10-15 MED ORDER — AZITHROMYCIN 250 MG PO TABS: ORAL_TABLET | ORAL | 0 refills | Status: DC

## 2016-10-15 MED ORDER — PREDNISONE 20 MG PO TABS: ORAL_TABLET | ORAL | 0 refills | Status: DC

## 2016-10-15 NOTE — Patient Instructions (Addendum)
Discussed use of Mucinex D for congestion. Start antibiotic if cough not improving over the next week.

## 2016-10-15 NOTE — Progress Notes (Signed)
Subjective:     Patient ID: Alexandria [name], female   DOB: 06-Oct-1955, 61 y.o.   MRN: [id number]  HPI  Chief Complaint  Patient presents with  . Cough    Patient reports that she has had a cough X 3 weeks. She reports she was seen in the office about 3 weeks ago and was treated for bronchitis. She reports that she still has a hacking cough that is worse at night. Patient denies any fever, but she does feel more short of breath than usual.   States she felt better for one week after completing antibiotic from last o.v.for bronchitis. Subsequently in the last week developed sneezing, clear sinus congestion, frontal headache, dry throat, and increased minimally productive night time cough with wheezing. She has been using the cough syrup at night and albuterol every 6 hours with transient improvement. States she has smoked little while ill.   Review of Systems     Objective:   Physical Exam  Constitutional: She appears well-developed and well-nourished. She has a sickly appearance.  Ears: T.M's intact without inflammation Sinuses: mild frontal sinus tenderness Throat: no tonsillar enlargement or exudate Neck: no cervical adenopathy Lungs: bilateral posterior expiratory wheezes     Assessment:    1. Viral upper respiratory tract infection - HYDROcodone-homatropine (HYCODAN) 5-1.5 MG/5ML syrup; 5 ml 4-6 hours as needed for cough  Dispense: 240 mL; Refill: 0  2. Expiratory wheezing - predniSONE (DELTASONE) 20 MG tablet; Taper as follows: 3 pills for 4 days, two pills for 4 days, one pill for four days  Dispense: 24 tablet; Refill: 0 - albuterol (PROVENTIL HFA;VENTOLIN HFA) 108 (90 Base) MCG/ACT inhaler; Inhale 2 puffs into the lungs every 4 (four) hours as needed for wheezing or shortness of breath.  Dispense: 1 Inhaler; Refill: 5 - azithromycin (ZITHROMAX Z-PAK) 250 MG tablet; 2 pills the first day then one pill daily  Dispense: 6 each; Refill: 0     Plan:    Continue Mucinex D. Start abx if  cough not improving over the next week. Increase albuterol to 4 hours if needed. Consider LAMA inhaler.

## 2016-11-22 ENCOUNTER — Telehealth: Payer: Self-pay

## 2016-11-22 NOTE — Telephone Encounter (Signed)
Patient called and states that she has felt weird today, she is having shortness of breath, chest tightness but feels like anxiety-she knows because she had these episodes before. Her B/P around lunch time or 2 pm was 153/85 and heart rate 104 and then she took another Clonazepam tablet and around 3:50 pm b/p was 164/85 and heart rate 110. Consult with Carmon Ginsberg PA and he advised to make sure and use Albuterol inhaler and if she gets worse to go to ER, come in in the morning to be seen if she is not better. Patient advised. Patient will try to rest and re check b/p in 1 to 2 hours and was advised if b/p keeps going up to seek treatment today-aa

## 2017-01-20 ENCOUNTER — Other Ambulatory Visit: Payer: Self-pay | Admitting: Family Medicine

## 2017-01-20 NOTE — Telephone Encounter (Signed)
Called into [facility]. Alexandria [name], CMA

## 2017-01-29 ENCOUNTER — Encounter: Payer: Self-pay | Admitting: Family Medicine

## 2017-01-29 ENCOUNTER — Ambulatory Visit (INDEPENDENT_AMBULATORY_CARE_PROVIDER_SITE_OTHER): Payer: BLUE CROSS/BLUE SHIELD | Admitting: Family Medicine

## 2017-01-29 VITALS — BP 110/84 | HR 87 | Temp 97.5°F | Resp 18 | Wt 149.8 lb

## 2017-01-29 DIAGNOSIS — J301 Allergic rhinitis due to pollen: Secondary | ICD-10-CM

## 2017-01-29 DIAGNOSIS — J4520 Mild intermittent asthma, uncomplicated: Secondary | ICD-10-CM | POA: Diagnosis not present

## 2017-01-29 DIAGNOSIS — J309 Allergic rhinitis, unspecified: Secondary | ICD-10-CM | POA: Insufficient documentation

## 2017-01-29 MED ORDER — PREDNISONE 20 MG PO TABS: ORAL_TABLET | ORAL | 0 refills | Status: DC

## 2017-01-29 MED ORDER — CEFDINIR 300 MG PO CAPS: 300.0000 mg | ORAL_CAPSULE | Freq: Two times a day (BID) | ORAL | 0 refills | Status: DC

## 2017-01-29 NOTE — Patient Instructions (Signed)
Add Mucinex to thin your secretions. Stop smoking especially while ill.

## 2017-01-29 NOTE — Progress Notes (Signed)
Subjective:     Patient ID: Alexandria [name], female   DOB: Dec 25, 1955, 61 y.o.   MRN: [id number]  HPI  Chief Complaint  Patient presents with  . Cough    Patient comes in office today with complaints of cough and congestion for the past 4 weeks. Patient states that she has had wheezing and shortness of breath. Patient has been taking otc Nighttime cough syrup.   States she felt her allergies were out of control and started otc Zyrtec. Reports persistent clear sinus drainage, with occasional green appearing sputum, and wheezing. Has been using her albuterol > every hours. States she has cut down on her smoking while ill. Accompanied by her daughter today.   Review of Systems     Objective:   Physical Exam  Constitutional: She appears well-developed and well-nourished. No distress.  Ears: T.M's intact without inflammation Throat: no tonsillar enlargement or exudate Neck: no cervical adenopathy Lungs: expiratory wheezes bilateral lung fields     Assessment:    1. Seasonal allergic rhinitis due to pollen  2. Mild intermittent reactive airway disease with wheezing without complication - cefdinir (OMNICEF) 300 MG capsule; Take 1 capsule (300 mg total) by mouth 2 (two) times daily.  Dispense: 14 capsule; Refill: 0 - predniSONE (DELTASONE) 20 MG tablet; Taper as follows: 3 pills for 4 days, two pills for 4 days, one pill for four days  Dispense: 24 tablet; Refill: 0    Plan:    Discussed use of steroid nasal sprays for allergies. Stop smoking.

## 2017-03-17 ENCOUNTER — Other Ambulatory Visit: Payer: Self-pay | Admitting: Family Medicine

## 2017-03-17 ENCOUNTER — Encounter: Payer: Self-pay | Admitting: Family Medicine

## 2017-03-17 ENCOUNTER — Ambulatory Visit (INDEPENDENT_AMBULATORY_CARE_PROVIDER_SITE_OTHER): Payer: BLUE CROSS/BLUE SHIELD | Admitting: Family Medicine

## 2017-03-17 VITALS — BP 122/72 | HR 83 | Temp 97.8°F | Resp 16 | Ht 66.5 in | Wt 149.2 lb

## 2017-03-17 DIAGNOSIS — Z1159 Encounter for screening for other viral diseases: Secondary | ICD-10-CM | POA: Diagnosis not present

## 2017-03-17 DIAGNOSIS — Z8659 Personal history of other mental and behavioral disorders: Secondary | ICD-10-CM | POA: Diagnosis not present

## 2017-03-17 DIAGNOSIS — E782 Mixed hyperlipidemia: Secondary | ICD-10-CM

## 2017-03-17 DIAGNOSIS — K5790 Diverticulosis of intestine, part unspecified, without perforation or abscess without bleeding: Secondary | ICD-10-CM | POA: Insufficient documentation

## 2017-03-17 DIAGNOSIS — Z23 Encounter for immunization: Secondary | ICD-10-CM | POA: Diagnosis not present

## 2017-03-17 DIAGNOSIS — Z0000 Encounter for general adult medical examination without abnormal findings: Secondary | ICD-10-CM | POA: Diagnosis not present

## 2017-03-17 DIAGNOSIS — I10 Essential (primary) hypertension: Secondary | ICD-10-CM | POA: Diagnosis not present

## 2017-03-17 MED ORDER — CLONAZEPAM 0.5 MG PO TABS: 0.5000 mg | ORAL_TABLET | Freq: Two times a day (BID) | ORAL | 5 refills | Status: DC | PRN

## 2017-03-17 NOTE — Patient Instructions (Signed)
We will call you about the lab results and pap smear.

## 2017-03-17 NOTE — Progress Notes (Signed)
Subjective:     Patient ID: Alexandria [name], female   DOB: 06-02-1956, 61 y.o.   MRN: [id number]  HPI  Chief Complaint  Patient presents with  . Annual Exam    Patient comes in office today for her annual physical and pap examination. Patient reports that she is feeling well today and has no concerns to address, patient is repordtley following a well balanced diet and staying active by walking daily. Patient reports that she averages between 6-8hrs of sleep a night and states that her libido is normal. Patient is due today for Tdap vaccine. Patients last reported: Pneumo 23- 03/10/15, Zoster 03/10/15, Pap smear 11/11/11 ( negative/hpv negative) Mammo 06/19/16 WNL.   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 Reports she quit smoking 01/31/17. One daughter and grandchildren staying with her and her husband.   Review of Systems General: Feeling well HEENT: edentulous, eye exams with Dr. Matilde Sprang. Cardiovascular: no chest pain, shortness of breath, or palpitations Respiratory: states she rarely uses albuterol if not ill. GI: no heartburn, no change in bowel habits or blood in the stool GU:  no change in bladder habits. Reports no vaginal discharge. Occasionally sexually active with her husband. Psychiatric: not depressed, Continues with clonazepam as needed for anxiety. 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    Objective:   Physical Exam  Constitutional: She appears well-developed and well-nourished. No distress.  Eyes: PERRLA, EOMI Neck: no thyromegaly, tenderness or nodules, no cervical adenopathy, no carotid bruits. Breasts: no masses or nipple discharge, no axillary adenopathy ENT: TM's intact without inflammation; No tonsillar enlargement or exudate, edentulous Lungs: Clear Heart : RRR without murmur or gallop Abd: bowel sounds present, soft, non-tender, no organomegaly GU: Right labia with small inclusion cyst, small  amount of thin white discharge, Cervix friable with erythematous area noted on lateral aspect. Pap obtained. No adnexal/uterine mass or tenderness appreciated. Extremities: no edema,       Assessment:    1. Need for tetanus, diphtheria, and acellular pertussis (Tdap) vaccine - Tdap vaccine greater than or equal to 7yo IM  2. Annual physical exam - Pap IG and HPV (high risk) DNA detection  3. Mixed hyperlipidemia - Lipid panel  4. History of anxiety  5. Essential hypertension - Comprehensive metabolic panel  6. Encounter for hepatitis C screening test for low risk patient - Hepatitis C Antibody    Plan:    Further f/u pending lab work. Will be due to mammogram in October.

## 2017-03-17 NOTE — Telephone Encounter (Signed)
Pt contacted office for refill request on the following medications:  90 day supply.  [place]  CB#[phone]/MW  amLODipine (NORVASC) 10 MG tablet  clonazePAM (KLONOPIN) 0.5 MG tablet

## 2017-03-17 NOTE — Progress Notes (Signed)
Med called in

## 2017-03-18 ENCOUNTER — Other Ambulatory Visit: Payer: Self-pay | Admitting: Family Medicine

## 2017-03-18 ENCOUNTER — Telehealth: Payer: Self-pay

## 2017-03-18 DIAGNOSIS — E782 Mixed hyperlipidemia: Secondary | ICD-10-CM

## 2017-03-18 LAB — LIPID PANEL
CHOL/HDL RATIO: 4.2 ratio (ref 0.0–4.4)
CHOLESTEROL TOTAL: 212 mg/dL — AB (ref 100–199)
HDL: 51 mg/dL (ref >39)
LDL CALC: 141 mg/dL — AB (ref 0–99)
Triglycerides: 100 mg/dL (ref 0–149)
VLDL CHOLESTEROL CAL: 20 mg/dL (ref 5–40)

## 2017-03-18 LAB — COMPREHENSIVE METABOLIC PANEL
ALK PHOS: 87 IU/L (ref 39–117)
ALT: 12 IU/L (ref 0–32)
AST: 14 IU/L (ref 0–40)
Albumin/Globulin Ratio: 2.1 (ref 1.2–2.2)
Albumin: 4.5 g/dL (ref 3.6–4.8)
BUN / CREAT RATIO: 11 — AB (ref 12–28)
BUN: 8 mg/dL (ref 8–27)
Bilirubin Total: 0.5 mg/dL (ref 0.0–1.2)
CO2: 27 mmol/L (ref 20–29)
CREATININE: 0.71 mg/dL (ref 0.57–1.00)
Calcium: 10.1 mg/dL (ref 8.7–10.3)
Chloride: 96 mmol/L (ref 96–106)
GFR calc non Af Amer: 92 mL/min/{1.73_m2} (ref >59)
GFR, EST AFRICAN AMERICAN: 106 mL/min/{1.73_m2} (ref >59)
Globulin, Total: 2.1 g/dL (ref 1.5–4.5)
Glucose: 97 mg/dL (ref 65–99)
Potassium: 4.1 mmol/L (ref 3.5–5.2)
Sodium: 141 mmol/L (ref 134–144)
TOTAL PROTEIN: 6.6 g/dL (ref 6.0–8.5)

## 2017-03-18 LAB — HEPATITIS C ANTIBODY

## 2017-03-18 MED ORDER — ATORVASTATIN CALCIUM 10 MG PO TABS: 10.0000 mg | ORAL_TABLET | Freq: Every day | ORAL | 0 refills | Status: DC

## 2017-03-18 NOTE — Telephone Encounter (Signed)
Low dose atorvastatin sent in.

## 2017-03-18 NOTE — Telephone Encounter (Signed)
Would she like to try a different statin medication?

## 2017-03-18 NOTE — Telephone Encounter (Signed)
[id number] 

## 2017-03-18 NOTE — Telephone Encounter (Signed)
-----   Message from Carmon Ginsberg, [place] sent at 03/18/2017 11:57 AM EDT ----- Hepatitis C negative

## 2017-03-18 NOTE — Telephone Encounter (Signed)
Spoke with patient on the phone and advised her of lab reports,  she sates that she d/c cholesterol mediation over a year ago with your approval. Patient states that cholesterol mediation caused her to have cramping in her legs.

## 2017-03-18 NOTE — Telephone Encounter (Signed)
Patient notified, she states that she is willing to try a new statin drug and request that it be sent to [facility] on [address]. KW

## 2017-03-20 LAB — PAP IG AND HPV HIGH-RISK
HPV, HIGH-RISK: NEGATIVE
PAP SMEAR COMMENT: 0

## 2017-03-20 NOTE — Progress Notes (Signed)
Advised 

## 2017-04-15 ENCOUNTER — Encounter: Payer: Self-pay | Admitting: Family Medicine

## 2017-04-15 ENCOUNTER — Ambulatory Visit (INDEPENDENT_AMBULATORY_CARE_PROVIDER_SITE_OTHER): Payer: BLUE CROSS/BLUE SHIELD | Admitting: Family Medicine

## 2017-04-15 VITALS — BP 110/76 | HR 94 | Temp 97.5°F | Resp 17 | Wt 147.8 lb

## 2017-04-15 DIAGNOSIS — J683 Other acute and subacute respiratory conditions due to chemicals, gases, fumes and vapors: Secondary | ICD-10-CM | POA: Diagnosis not present

## 2017-04-15 DIAGNOSIS — E782 Mixed hyperlipidemia: Secondary | ICD-10-CM

## 2017-04-15 DIAGNOSIS — J069 Acute upper respiratory infection, unspecified: Secondary | ICD-10-CM | POA: Diagnosis not present

## 2017-04-15 MED ORDER — ATORVASTATIN CALCIUM 10 MG PO TABS: 10.0000 mg | ORAL_TABLET | Freq: Every day | ORAL | 3 refills | Status: DC

## 2017-04-15 MED ORDER — AZITHROMYCIN 250 MG PO TABS: ORAL_TABLET | ORAL | 0 refills | Status: DC

## 2017-04-15 MED ORDER — PREDNISONE 20 MG PO TABS: ORAL_TABLET | ORAL | 1 refills | Status: DC

## 2017-04-15 NOTE — Patient Instructions (Signed)
Start the antibiotic if sputum not improving. Continue expectorants and decongestants.

## 2017-04-15 NOTE — Progress Notes (Signed)
Subjective:     Patient ID: Alexandria [name], female   DOB: 1955-10-24, 61 y.o.   MRN: [id number]  HPI  Chief Complaint  Patient presents with  . Cough    Patient comes in office today with complaints of productive cough with phlegm for over 7 days. Patient reports that cough has been worse in the evening and she reports pressure in fullness in both ears. Patient has bene taking otc Sudafed, Robitussin and Albuterol.   States increased shortness of breath with albuterol use 4 x day. Sputum is described as thick and white. She remains off cigarettes but has had second hand smoke exposure from her daughter. Also wishes refill on atorvastatin.   Review of Systems     Objective:   Physical Exam  Constitutional: She appears well-developed and well-nourished. No distress.  Ears: R T.M intact without inflammation; L TM scarred Sinuses: non-tender Throat: no tonsillar enlargement or exudate Neck: no cervical adenopathy Lungs: Bilateral posterior expiratory wheezes     Assessment:    1. Viral upper respiratory tract infection  2. Reactive airways dysfunction syndrome, mild intermittent, with acute exacerbation (HCC) - predniSONE (DELTASONE) 20 MG tablet; One pill twice daily x 5 days  Dispense: 10 tablet; Refill: 1 - azithromycin (ZITHROMAX) 250 MG tablet; 2 pills the first day then one pill daily.  Dispense: 6 tablet; Refill: 0  3. Mixed hyperlipidemia - atorvastatin (LIPITOR) 10 MG tablet; Take 1 tablet (10 mg total) by mouth daily.  Dispense: 90 tablet; Refill: 3 - Lipid panel    Plan:    Continue otc decongestants and expectorants. Start abx if sputum not improving.

## 2017-06-18 ENCOUNTER — Other Ambulatory Visit: Payer: Self-pay | Admitting: Family Medicine

## 2017-09-19 ENCOUNTER — Other Ambulatory Visit: Payer: Self-pay | Admitting: Family Medicine

## 2017-09-22 ENCOUNTER — Other Ambulatory Visit: Payer: Self-pay | Admitting: Family Medicine

## 2017-09-22 MED ORDER — CLONAZEPAM 0.5 MG PO TABS: 0.5000 mg | ORAL_TABLET | Freq: Two times a day (BID) | ORAL | 5 refills | Status: DC | PRN

## 2017-09-22 NOTE — Telephone Encounter (Signed)
Please call in as updated in the EMR.

## 2017-09-22 NOTE — Telephone Encounter (Signed)
Prescription for Clonazepam has been called in [facility]. KW

## 2018-02-17 ENCOUNTER — Other Ambulatory Visit: Payer: Self-pay | Admitting: Family Medicine

## 2018-02-17 DIAGNOSIS — R062 Wheezing: Secondary | ICD-10-CM

## 2018-02-25 ENCOUNTER — Ambulatory Visit: Payer: BLUE CROSS/BLUE SHIELD | Admitting: Family Medicine

## 2018-02-25 ENCOUNTER — Encounter: Payer: Self-pay | Admitting: Family Medicine

## 2018-02-25 ENCOUNTER — Other Ambulatory Visit: Payer: Self-pay | Admitting: Family Medicine

## 2018-02-25 VITALS — BP 122/74 | HR 87 | Temp 98.1°F | Resp 17 | Wt 158.8 lb

## 2018-02-25 DIAGNOSIS — J069 Acute upper respiratory infection, unspecified: Secondary | ICD-10-CM | POA: Diagnosis not present

## 2018-02-25 DIAGNOSIS — J683 Other acute and subacute respiratory conditions due to chemicals, gases, fumes and vapors: Secondary | ICD-10-CM | POA: Diagnosis not present

## 2018-02-25 MED ORDER — PREDNISONE 20 MG PO TABS: ORAL_TABLET | ORAL | 0 refills | Status: DC

## 2018-02-25 MED ORDER — AZITHROMYCIN 250 MG PO TABS: ORAL_TABLET | ORAL | 0 refills | Status: DC

## 2018-02-25 NOTE — Progress Notes (Signed)
  Subjective:     Patient ID: Alexandria [name], female   DOB: [birth year], 62 y.o.   MRN: [id number] Chief Complaint  Patient presents with  . Cough    Patient comes in office today with concerns of cough and congestion for one week. Patient reports symptoms of shortness of breath, sore throat, upper back pain, low grade fever high of 100.4 and fatigue. Patient has been taking otc Mucinex, Aleve and Tylenol   HPI States she has been using Albuterol every 4 hours. Reports sinuses improving but has purulent sputum. States she rarely smokes now.  Review of Systems     Objective:   Physical Exam  Constitutional: She appears well-developed and well-nourished. No distress.  Ears: T.M's intact without inflammation Sinuses: non-tender Throat: no tonsillar enlargement or exudate Neck: no cervical adenopathy Lungs: Inspiratory wheezes with diminished breath sounds     Assessment:    1. Reactive airways dysfunction syndrome, mild intermittent, with acute exacerbation ([place]): add prednisone and Zithromax  2. Viral upper respiratory tract infection    Plan:    Discussed use of Delsym ( samples provided) and Mucinex D.

## 2018-02-25 NOTE — Patient Instructions (Signed)
Discussed use of Mucinex D for congestion and Delsym for cough. 

## 2018-03-21 ENCOUNTER — Other Ambulatory Visit: Payer: Self-pay | Admitting: Family Medicine

## 2018-03-23 ENCOUNTER — Telehealth: Payer: Self-pay

## 2018-03-23 NOTE — Telephone Encounter (Signed)
Prescription has been called into pharmacy. KW 

## 2018-03-23 NOTE — Telephone Encounter (Signed)
-----   Message from Carmon Ginsberg, [place] sent at 03/23/2018  7:32 AM EDT ----- Please call in clonazepam with 5 refills.

## 2018-03-24 ENCOUNTER — Encounter: Payer: Self-pay | Admitting: Family Medicine

## 2018-03-24 ENCOUNTER — Other Ambulatory Visit: Payer: Self-pay | Admitting: Family Medicine

## 2018-03-24 ENCOUNTER — Ambulatory Visit (INDEPENDENT_AMBULATORY_CARE_PROVIDER_SITE_OTHER): Payer: BLUE CROSS/BLUE SHIELD | Admitting: Family Medicine

## 2018-03-24 VITALS — BP 124/80 | HR 84 | Temp 97.6°F | Resp 16 | Ht 67.0 in | Wt 156.8 lb

## 2018-03-24 DIAGNOSIS — I10 Essential (primary) hypertension: Secondary | ICD-10-CM

## 2018-03-24 DIAGNOSIS — Z1239 Encounter for other screening for malignant neoplasm of breast: Secondary | ICD-10-CM

## 2018-03-24 DIAGNOSIS — R350 Frequency of micturition: Secondary | ICD-10-CM

## 2018-03-24 DIAGNOSIS — J3089 Other allergic rhinitis: Secondary | ICD-10-CM | POA: Diagnosis not present

## 2018-03-24 DIAGNOSIS — E782 Mixed hyperlipidemia: Secondary | ICD-10-CM

## 2018-03-24 DIAGNOSIS — Z0000 Encounter for general adult medical examination without abnormal findings: Secondary | ICD-10-CM

## 2018-03-24 DIAGNOSIS — Z1231 Encounter for screening mammogram for malignant neoplasm of breast: Secondary | ICD-10-CM | POA: Diagnosis not present

## 2018-03-24 LAB — POCT URINALYSIS DIPSTICK
Blood, UA: NEGATIVE
Glucose, UA: NEGATIVE
KETONES UA: NEGATIVE
Leukocytes, UA: NEGATIVE
NITRITE UA: POSITIVE
PH UA: 6.5 (ref 5.0–8.0)
PROTEIN UA: POSITIVE — AB
SPEC GRAV UA: 1.015 (ref 1.010–1.025)
UROBILINOGEN UA: 0.2 U/dL

## 2018-03-24 NOTE — Progress Notes (Signed)
  Subjective:     Patient ID: Alexandria [name], female   DOB: 21-Sep-1955, 62 y.o.   MRN: [id number] Chief Complaint  Patient presents with  . Annual Exam    Patient comes in office today for her annual physical she states that she feels well today and has no concerns to address. Patient reports following a well balanced diet, walking 4x a week, sleeping on average 7rhs a night and states that her libido is decreased. Patient last reported pap smear 03/17/17, mammogram 06/20/16, colonoscopy 04/10/15, Tdap 03/17/17, Pneumo 23 03/10/15.    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 Her daughter and two grandchildren also live with her. States she still smokes 4 cigarettes a week.  Review of Systems General: Feeling well HEENT: edentulous; reports eye exams. Mild sinus congestion with PND and accompanying cough. Increased when supine.  Lungs: occasional albuterol use. Cardiovascular: no chest pain, shortness of breath, or palpitations Breasts: patient reports month breast exams GI: no heartburn, no change in bowel habits or blood in the stool GU: chronic urinary urgency and frequency. Did not tolerate trial on Ditropan due to abdominal pain. Psychiatric: not depressed but uses clonazepam for anxiety. Musculoskeletal: no joint pain    Objective:   Physical Exam  Constitutional: She appears well-developed and well-nourished. No distress.  Eyes: PERRLA, EOMI Neck: no thyromegaly, tenderness or nodules, no cervical adenopathy or carotid bruits ENT: TM's intact without inflammation, scarring present. No tonsillar enlargement or exudate, Lungs: Clear Breasts: no axillary adenopathy, breast mass or nipple discharge Heart : RRR without murmur or gallop Abd: bowel sounds present, soft, non-tender, no organomegaly Extremities: no edema      Assessment:    1. Urinary frequency - POCT urinalysis dipstick - Ambulatory referral to Urology - Urine Culture  2. Annual physical  exam  3. Essential hypertension - Comprehensive metabolic panel  4. Mixed hyperlipidemia - Lipid panel  5. Screening for breast cancer - MM Digital Screening; Future  6. Non-seasonal allergic rhinitis due to other allergic trigger: Try Claritin otc    Plan:    Further f/u pending lab and culture results. Do call [facility] to schedule a mammogram.

## 2018-03-24 NOTE — Patient Instructions (Addendum)
We will call you with the lab results. Please call [facility] and make an appointment. We will call you about the urology referral. Try Claritin for sinus congestion and post nasal drainage.

## 2018-03-25 ENCOUNTER — Telehealth: Payer: Self-pay

## 2018-03-25 ENCOUNTER — Other Ambulatory Visit: Payer: Self-pay | Admitting: Family Medicine

## 2018-03-25 LAB — COMPREHENSIVE METABOLIC PANEL
ALBUMIN: 4.5 g/dL (ref 3.6–4.8)
ALT: 9 IU/L (ref 0–32)
AST: 11 IU/L (ref 0–40)
Albumin/Globulin Ratio: 1.9 (ref 1.2–2.2)
Alkaline Phosphatase: 104 IU/L (ref 39–117)
BILIRUBIN TOTAL: 0.3 mg/dL (ref 0.0–1.2)
BUN/Creatinine Ratio: 14 (ref 12–28)
BUN: 10 mg/dL (ref 8–27)
CO2: 25 mmol/L (ref 20–29)
CREATININE: 0.69 mg/dL (ref 0.57–1.00)
Calcium: 10.4 mg/dL — ABNORMAL HIGH (ref 8.7–10.3)
Chloride: 96 mmol/L (ref 96–106)
GFR calc non Af Amer: 93 mL/min/{1.73_m2} (ref >59)
GFR, EST AFRICAN AMERICAN: 107 mL/min/{1.73_m2} (ref >59)
GLOBULIN, TOTAL: 2.4 g/dL (ref 1.5–4.5)
GLUCOSE: 107 mg/dL — AB (ref 65–99)
Potassium: 3.7 mmol/L (ref 3.5–5.2)
Sodium: 138 mmol/L (ref 134–144)
Total Protein: 6.9 g/dL (ref 6.0–8.5)

## 2018-03-25 LAB — LIPID PANEL
CHOLESTEROL TOTAL: 195 mg/dL (ref 100–199)
Chol/HDL Ratio: 3.9 ratio (ref 0.0–4.4)
HDL: 50 mg/dL (ref >39)
LDL Calculated: 123 mg/dL — ABNORMAL HIGH (ref 0–99)
Triglycerides: 110 mg/dL (ref 0–149)
VLDL Cholesterol Cal: 22 mg/dL (ref 5–40)

## 2018-03-25 MED ORDER — ATORVASTATIN CALCIUM 20 MG PO TABS: 20.0000 mg | ORAL_TABLET | Freq: Every day | ORAL | 0 refills | Status: DC

## 2018-03-25 NOTE — Telephone Encounter (Signed)
-----   Message from Carmon Ginsberg, [place] sent at 03/25/2018  7:32 AM EDT ----- LDL cholesterol not below 100. Will increase atorvastatin to 20 mg. Other labs ok with mild increase in sugar. Don't forget regular exercise.

## 2018-03-25 NOTE — Telephone Encounter (Signed)
Patient advised.KW 

## 2018-03-26 ENCOUNTER — Telehealth: Payer: Self-pay

## 2018-03-26 ENCOUNTER — Other Ambulatory Visit: Payer: Self-pay | Admitting: Family Medicine

## 2018-03-26 DIAGNOSIS — N3090 Cystitis, unspecified without hematuria: Secondary | ICD-10-CM

## 2018-03-26 LAB — URINE CULTURE

## 2018-03-26 MED ORDER — CEPHALEXIN 500 MG PO CAPS: 500.0000 mg | ORAL_CAPSULE | Freq: Two times a day (BID) | ORAL | 0 refills | Status: DC

## 2018-03-26 NOTE — Telephone Encounter (Signed)
Pt advised.   Thanks,   -Laura  

## 2018-03-26 NOTE — Telephone Encounter (Signed)
-----   Message from Carmon Ginsberg, [place] sent at 03/26/2018 11:42 AM EDT ----- Let her know she has a bladder infection and I have sent in Keflex to [facility]

## 2018-04-10 ENCOUNTER — Ambulatory Visit
Admission: RE | Admit: 2018-04-10 | Discharge: 2018-04-10 | Disposition: A | Discharge status: Home / Self Care | Payer: BLUE CROSS/BLUE SHIELD | Source: Ambulatory Visit | Attending: Family Medicine | Admitting: Family Medicine

## 2018-04-10 DIAGNOSIS — Z1231 Encounter for screening mammogram for malignant neoplasm of breast: Secondary | ICD-10-CM | POA: Insufficient documentation

## 2018-04-10 DIAGNOSIS — Z1239 Encounter for other screening for malignant neoplasm of breast: Secondary | ICD-10-CM

## 2018-04-14 NOTE — Progress Notes (Signed)
04/15/2018 2:53 PM   Alexandria [name] 06/10/1956 [id number]  Referring provider: Carmon Ginsberg, [place], [place] [address]  Chief Complaint  Patient presents with  . Urinary Frequency    HPI: Patient is a 62 -year-old Caucasian female who is referred to [place] by Carmon Ginsberg, PA for urinary frequency.    She is having associated strong urinary urgency and SUI incontinence x two years.  Patient denies any gross hematuria, dysuria or suprapubic/flank pain.  Patient denies any fevers, chills, nausea or vomiting.  Her PVR is 31 mL.    She was tried on oxybutynin XL 10 mg daily.  It did help at first, but she developed a suprapubic pain and stopped the medication.  She did find it effective.    She states she has held her bladder a lot during the years of employment.    She does not have a history of nephrolithiasis, GU surgery or GU trauma.   She is post menopausal.   She denies constipation and/or diarrhea.   She is drinking a lot of water daily.   She is drinking orange aid.  She drinks two cups of coffee daily.  She does not drink a lot of tea.  She is not drinking alcohol.      PMH: Past Medical History:  Diagnosis Date  . Anxiety   . Carbuncle and furuncle of trunk   . HTN (hypertension)   . Hyperlipidemia   . Intrinsic asthma, unspecified   . Methicillin resistant Staphylococcus aureus in conditions classified elsewhere and of unspecified site   . Other psychological or physical stress, not elsewhere classified(V62.89)   . Tobacco abuse     Surgical History: Past Surgical History:  Procedure Laterality Date  . COLONOSCOPY WITH PROPOFOL N/A 04/10/2015   Procedure: COLONOSCOPY WITH PROPOFOL;  Surgeon: Lucilla Lame, MD;  Location: [facility];  Service: Endoscopy;  Laterality: N/A;  . POLYPECTOMY  04/10/2015   Procedure: POLYPECTOMY;  Surgeon: Lucilla Lame, MD;  Location: [facility];  Service: Endoscopy;;  . STATUS POST  ENDOMETRIAL ABLATION    . TONSILLECTOMY    . TUBAL LIGATION     bilateral, sterile ablation  . VARICOSE VEINS      Home Medications:  Allergies as of 04/15/2018      Reactions   Pravastatin Sodium    Myalgia      Medication List        Accurate as of 04/15/18  2:53 PM. Always use your most recent med list.          amLODipine 10 MG tablet Commonly known as:  NORVASC TAKE 1 TABLET BY MOUTH ONCE DAILY   atorvastatin 20 MG tablet Commonly known as:  LIPITOR Take 1 tablet (20 mg total) by mouth daily.   clonazePAM 0.5 MG tablet Commonly known as:  KLONOPIN TAKE 1 TABLET BY MOUTH TWICE DAILY AS NEEDED   conjugated estrogens vaginal cream Commonly known as:  PREMARIN Apply 0.5mg  (pea-sized amount)  just inside the vaginal introitus with a finger-tip on  Monday, Wednesday and Friday nights.   estradiol 0.1 MG/GM vaginal cream Commonly known as:  ESTRACE Apply 0.5mg  (pea-sized amount)  just inside the vaginal introitus with a finger-tip on Monday, Wednesday and Friday nights.   hydrochlorothiazide 25 MG tablet Commonly known as:  HYDRODIURIL TAKE ONE TABLET BY MOUTH ONCE DAILY   loratadine 10 MG tablet Commonly known as:  CLARITIN Take 10 mg by mouth daily.  MULTIPLE VITAMIN PO Take 1 tablet by mouth daily. Reported on 03/13/2016   PROVENTIL HFA 108 (90 Base) MCG/ACT inhaler Generic drug:  albuterol INHALE 2 PUFFS INTO LUNGS EVERY 4 HOURS AS NEEDED FOR WHEEZING OR SHORTNESS OF BREATH       Allergies:  Allergies  Allergen Reactions  . Pravastatin Sodium     Myalgia    Family History: Family History  Problem Relation Age of Onset  . Coronary artery disease Mother   . Hypertension Mother   . Rheum arthritis Mother   . Heart attack Mother   . Lung cancer Father   . Emphysema Father   . Gout Father   . Diabetes Sister   . Hypertension Daughter   . Hypertension Daughter   . Obesity Daughter   . Seizures Daughter   . Breast cancer Neg Hx   . Bladder  Cancer Neg Hx   . Kidney cancer Neg Hx     Social History:  reports that she quit smoking about 14 months ago. Her smoking use included cigarettes. She has a 15.00 pack-year smoking history. She has never used smokeless tobacco. She reports that she does not drink alcohol or use drugs.  ROS: UROLOGY Frequent Urination?: No Hard to postpone urination?: Yes Burning/pain with urination?: No Get up at night to urinate?: No Leakage of urine?: Yes Urine stream starts and stops?: No Trouble starting stream?: No Do you have to strain to urinate?: No Blood in urine?: No Urinary tract infection?: No Sexually transmitted disease?: No Injury to kidneys or bladder?: No Painful intercourse?: No Weak stream?: No Currently pregnant?: No Vaginal bleeding?: No Last menstrual period?: n  Gastrointestinal Nausea?: No Vomiting?: No Indigestion/heartburn?: No Diarrhea?: No Constipation?: No  Constitutional Fever: No Night sweats?: No Weight loss?: No Fatigue?: No  Skin Skin rash/lesions?: No Itching?: No  Eyes Blurred vision?: No Double vision?: No  Ears/Nose/Throat Sore throat?: No Sinus problems?: No  Hematologic/Lymphatic Swollen glands?: No Easy bruising?: Yes  Cardiovascular Leg swelling?: No Chest pain?: No  Respiratory Cough?: Yes Shortness of breath?: Yes  Endocrine Excessive thirst?: No  Musculoskeletal Back pain?: No Joint pain?: No  Neurological Headaches?: No Dizziness?: No  Psychologic Depression?: No Anxiety?: Yes  Physical Exam: BP 134/77 (BP Location: Left Arm, Patient Position: Sitting, Cuff Size: Normal)   Pulse 88   Ht 5\' 7"  (1.702 m)   Wt 157 lb 3.2 oz (71.3 kg)   BMI 24.62 kg/m   Constitutional: Well nourished. Alert and oriented, No acute distress. HEENT: [place] AT, moist mucus membranes. Trachea midline, no masses. Cardiovascular: No clubbing, cyanosis, or edema. Respiratory: Normal respiratory effort, no increased work of  breathing. GI: Abdomen is soft, non tender, non distended, no abdominal masses. Liver and spleen not palpable.  No hernias appreciated.  Stool sample for occult testing is not indicated.   GU: No CVA tenderness.  No bladder fullness or masses.  Atrophic external genitalia, normal pubic hair distribution, no lesions.  Normal urethral meatus, no lesions, no prolapse, no discharge.   No urethral masses, tenderness and/or tenderness. No bladder fullness, tenderness or masses. Pale vagina mucosa, poor estrogen effect, no discharge, no lesions, poor pelvic support, grade II cystocele and rectocele noted.  No cervical motion tenderness.  Uterus is freely mobile and non-fixed.  No adnexal/parametria masses or tenderness noted.  Anus and perineum are without rashes or lesions.    Skin: No rashes, bruises or suspicious lesions. Lymph: No cervical or inguinal adenopathy. Neurologic: Grossly intact, no focal  deficits, moving all 4 extremities. Psychiatric: Normal mood and affect.  Laboratory Data: Lab Results  Component Value Date   WBC 10.8 06/17/2015   HGB 15.2 06/17/2015   HCT 43.9 06/17/2015   MCV 84.9 06/17/2015   PLT 218 06/17/2015    Lab Results  Component Value Date   CREATININE 0.69 03/24/2018    No results found for: PSA  No results found for: TESTOSTERONE  No results found for: HGBA1C  No results found for: TSH     Component Value Date/Time   CHOL 195 03/24/2018 1045   HDL 50 03/24/2018 1045   CHOLHDL 3.9 03/24/2018 1045   LDLCALC 123 (H) 03/24/2018 1045    Lab Results  Component Value Date   AST 11 03/24/2018   Lab Results  Component Value Date   ALT 9 03/24/2018   No components found for: ALKALINEPHOPHATASE No components found for: BILIRUBINTOTAL  No results found for: ESTRADIOL  Urinalysis    Component Value Date/Time   COLORURINE Yellow 12/19/2014 2255   APPEARANCEUR Clear 12/19/2014 2255   LABSPEC 1.009 12/19/2014 2255   PHURINE 5.0 12/19/2014 2255    GLUCOSEU Negative 12/19/2014 2255   HGBUR 1+ 12/19/2014 2255   BILIRUBINUR snakk 03/24/2018 1036   BILIRUBINUR Negative 12/19/2014 2255   KETONESUR Negative 12/19/2014 2255   PROTEINUR Positive (A) 03/24/2018 1036   PROTEINUR Negative 12/19/2014 2255   UROBILINOGEN 0.2 03/24/2018 1036   NITRITE positive 03/24/2018 1036   NITRITE Negative 12/19/2014 2255   LEUKOCYTESUR Negative 03/24/2018 1036   LEUKOCYTESUR Negative 12/19/2014 2255    I have reviewed the labs.   Pertinent Imaging: Results for LAELYN, BLUMENTHAL (MRN [id number]) as of 04/15/2018 14:48  Ref. Range 04/15/2018 14:16  Scan Result Unknown 31     Assessment & Plan:    1. Mixed incontinence Discussed behavioral therapies ,bladder training and bladder control strategies - patient is retired now and can void when appropriate Pelvic floor muscle training - patient deferred at this time, she afraid of cost Failed anticholinergic therapy  Would like to try beta-3 adrenergic receptor;  would like to try the beta-3 adrenergic receptor agonist (Myrbetriq).  Given Myrbetriq 50 mg samples, #28.  I have reviewed with the patient of the side effects of Myrbetriq, such as: elevation in BP, urinary retention and/or HA.   Offered refer to gynecology for a pessary fitting -deferred RTC in 3 weeks for PVR and symptom recheck   2. Vaginal atrophy Patient was given a sample of vaginal estrogen cream (Premarin vaginal cream) and instructed to apply 0.5mg  (pea-sized amount)  just inside the vaginal introitus with a finger-tip on Monday, Wednesday and Friday nights.  I explained to the patient that vaginally administered estrogen, which causes only a slight increase in the blood estrogen levels, have fewer contraindications and adverse systemic effects that oral HT. I have also given prescriptions for the Estrace cream and Premarin cream, so that the patient may carry them to the pharmacy to see which one of the branded creams would be most economical  for her.  If she finds both medications cost prohibitive, she is instructed to call the office.  We can then call in a compounded vaginal estrogen cream for the patient that may be more affordable.   She will follow up in three months for an exam.    Return in about 3 weeks (around 05/06/2018) for PVR and OAB questionnaire.  These notes generated with voice recognition software. I apologize for typographical errors.  Gevork Ayyad  [place], [facility] [address] [place], [place] [address] [phone]

## 2018-04-15 ENCOUNTER — Ambulatory Visit: Payer: BLUE CROSS/BLUE SHIELD | Admitting: Urology

## 2018-04-15 ENCOUNTER — Encounter: Payer: Self-pay | Admitting: Urology

## 2018-04-15 VITALS — BP 134/77 | HR 88 | Ht 67.0 in | Wt 157.2 lb

## 2018-04-15 DIAGNOSIS — N3946 Mixed incontinence: Secondary | ICD-10-CM

## 2018-04-15 DIAGNOSIS — N952 Postmenopausal atrophic vaginitis: Secondary | ICD-10-CM | POA: Diagnosis not present

## 2018-04-15 LAB — BLADDER SCAN AMB NON-IMAGING: SCAN RESULT: 31

## 2018-04-15 MED ORDER — ESTRADIOL 0.1 MG/GM VA CREA: TOPICAL_CREAM | VAGINAL | 12 refills | Status: DC

## 2018-04-15 MED ORDER — ESTROGENS, CONJUGATED 0.625 MG/GM VA CREA: TOPICAL_CREAM | VAGINAL | 12 refills | Status: DC

## 2018-04-15 NOTE — Patient Instructions (Addendum)
I have given you two prescriptions for a vaginal estrogen cream.  Estrace and Premarin.  Please take these to your pharmacy and see which one your insurance covers.  If both are too expensive, please call the office at [phone] for an alternative.  You are given a sample of vaginal estrogen cream Premarin and instructed to apply 0.5mg  (pea-sized amount)  just inside the vaginal introitus with a finger-tip on Monday, Wednesday and Friday nights,    Atrophic Vaginitis Atrophic vaginitis is a condition in which the tissues that line the vagina become dry and thin. This condition is most common in women who have stopped having regular menstrual periods (menopause). This usually starts when a woman is 56-31 years old. Estrogen helps to keep the vagina moist. It stimulates the vagina to produce a clear fluid that lubricates the vagina for sexual intercourse. This fluid also protects the vagina from infection. Lack of estrogen can cause the lining of the vagina to get thinner and dryer. The vagina may also shrink in size. It may become less elastic. Atrophic vaginitis tends to get worse over time as a woman's estrogen level drops. What are the causes? This condition is caused by the normal drop in estrogen that happens around the time of menopause. What increases the risk? Certain conditions or situations may lower a woman's estrogen level, which increases her risk of atrophic vaginitis. These include:  Taking medicine that blocks estrogen.  Having ovaries removed surgically.  Being treated for cancer with X-ray treatment (radiation) or medicines (chemotherapy).  Exercising very hard and often.  Having an eating disorder (anorexia).  Giving birth or breastfeeding.  Being over the age of 1.  Smoking.  What are the signs or symptoms? Symptoms of this condition include:  Pain, soreness, or bleeding during sexual intercourse (dyspareunia).  Vaginal burning, irritation, or itching.  Pain  or bleeding during a vaginal examination using a speculum (pelvic exam).  Loss of interest in sexual activity.  Having burning pain when passing urine.  Vaginal discharge that is brown or yellow.  In some cases, there are no symptoms. How is this diagnosed? This condition is diagnosed with a medical history and physical exam. This will include a pelvic exam that checks whether the inside of your vagina appears pale, thin, or dry. Rarely, you may also have other tests, including:  A urine test.  A test that checks the acid balance in your vaginal fluid (acid balance test).  How is this treated? Treatment for this condition may depend on the severity of your symptoms. Treatment may include:  Using an over-the-counter vaginal lubricant before you have sexual intercourse.  Using a long-acting vaginal moisturizer.  Using low-dose vaginal estrogen for moderate to severe symptoms that do not respond to other treatments. Options include creams, tablets, and inserts (vaginal rings). Before using vaginal estrogen, tell your health care provider if you have a history of: ? Breast cancer. ? Endometrial cancer. ? Blood clots.  Taking medicines. You may be able to take a daily pill for dyspareunia. Discuss all of the risks of this medicine with your health care provider. It is usually not recommended for women who have a family history or personal history of breast cancer.  If your symptoms are very mild and you are not sexually active, you may not need treatment. Follow these instructions at home:  Take medicines only as directed by your health care provider. Do not use herbal or alternative medicines unless your health care provider says that  you can.  Use over-the-counter creams, lubricants, or moisturizers for dryness only as directed by your health care provider.  If your atrophic vaginitis is caused by menopause, discuss all of your menopausal symptoms and treatment options with your  health care provider.  Do not douche.  Do not use products that can make your vagina dry. These include: ? Scented feminine sprays. ? Scented tampons. ? Scented soaps.  If it hurts to have sex, talk with your sexual partner. Contact a health care provider if:  Your discharge looks different than normal.  Your vagina has an unusual smell.  You have new symptoms.  Your symptoms do not improve with treatment.  Your symptoms get worse. This information is not intended to replace advice given to you by your health care provider. Make sure you discuss any questions you have with your health care provider. Document Released: 01/03/2015 Document Revised: 01/25/2016 Document Reviewed: 08/10/2014 Elsevier Interactive Patient Education  [employer].

## 2018-05-05 ENCOUNTER — Ambulatory Visit: Payer: BLUE CROSS/BLUE SHIELD | Admitting: Family Medicine

## 2018-05-05 ENCOUNTER — Encounter: Payer: Self-pay | Admitting: Family Medicine

## 2018-05-05 VITALS — BP 110/60 | HR 90 | Temp 97.9°F | Resp 16 | Wt 156.4 lb

## 2018-05-05 DIAGNOSIS — J40 Bronchitis, not specified as acute or chronic: Secondary | ICD-10-CM

## 2018-05-05 MED ORDER — HYDROCODONE-HOMATROPINE 5-1.5 MG/5ML PO SYRP: 5.0000 mL | ORAL_SOLUTION | Freq: Four times a day (QID) | ORAL | 0 refills | Status: DC | PRN

## 2018-05-05 MED ORDER — AZITHROMYCIN 250 MG PO TABS: ORAL_TABLET | ORAL | 0 refills | Status: DC

## 2018-05-05 MED ORDER — HYDROCODONE-HOMATROPINE 5-1.5 MG/5ML PO SYRP: 5.0000 mL | ORAL_SOLUTION | Freq: Four times a day (QID) | ORAL | 0 refills | Status: AC | PRN

## 2018-05-05 MED ORDER — PREDNISONE 20 MG PO TABS: ORAL_TABLET | ORAL | 1 refills | Status: DC

## 2018-05-05 NOTE — Progress Notes (Deleted)
cugh

## 2018-05-05 NOTE — Patient Instructions (Signed)
Add Mucinex and continue albuterol inhaler every 4-6 hours.

## 2018-05-05 NOTE — Progress Notes (Signed)
  Subjective:     Patient ID: Alexandria [name], female   DOB: [birth year], 62 y.o.   MRN: [id number] Chief Complaint  Patient presents with  . Cough    Patient comes in office today with complaints of cough and shortness of breath for onw week. Patient thatates that he has had upper back pain on the left side, wheezing,  congestion in her chest. Patient reports that she has been using Proventil and otc Delsym.   HPI Continues to smoke a few cigarettes a week.jDenies sore throat or sinus congestion. Reports thick clear sputum and increased shortness of breath.  Review of Systems     Objective:   Physical Exam  Constitutional: She appears well-developed and well-nourished. She appears distressed (frequent cough).  Ears: T.M's intact without inflammation Throat: no tonsillar enlargement or exudate Neck: no cervical adenopathy Lungs: Deep breathing triggers cough. Inspiratory and expiratory wheezes present.     Assessment:    1. Bronchitis: will start Zithromax, prednisone, and hydrocodone cough syrup.     Plan:    Will f/u in 2 weeks for spirometry and probable maintenance inhaler. Told to stop smoking.

## 2018-05-06 ENCOUNTER — Ambulatory Visit: Payer: BLUE CROSS/BLUE SHIELD | Admitting: Urology

## 2018-05-07 ENCOUNTER — Ambulatory Visit: Payer: BLUE CROSS/BLUE SHIELD | Admitting: Urology

## 2018-05-07 NOTE — Progress Notes (Deleted)
05/07/2018 6:45 AM   Alexandria [name] Feb 21, 1956 [id number]  Referring provider: Carmon Ginsberg, [place], [place] [address]  No chief complaint on file.   HPI: Patient is 62 year old Caucasian female with mixed incontinence and vaginal atrophy who presents today for a 3-week follow-up after trial of Myrbetriq 50 mg daily  Background history Patient is a 33 -year-old Caucasian female who is referred to [place] by Carmon Ginsberg, PA for urinary frequency.   She is having associated strong urinary urgency and SUI incontinence x two years.  Patient denies any gross hematuria, dysuria or suprapubic/flank pain.  Patient denies any fevers, chills, nausea or vomiting.  Her PVR is 31 mL.   She was tried on oxybutynin XL 10 mg daily.  It did help at first, but she developed a suprapubic pain and stopped the medication.  She did find it effective.   She states she has held her bladder a lot during the years of employment.   She does not have a history of nephrolithiasis, GU surgery or GU trauma.   She is post menopausal.   She denies constipation and/or diarrhea.   She is drinking a lot of water daily.   She is drinking orange aid.  She drinks two cups of coffee daily.  She does not drink a lot of tea.  She is not drinking alcohol.      Today, The patient has been experiencing urgency x *** (***), frequency x *** (***), not/is restricting fluids to avoid visits to the restroom ***, not/is engaging in toilet mapping, incontinence x *** (***) and nocturia x *** (***).   Her PVR is ***.  Her BP is ***.   Patient denies any gross hematuria, dysuria or suprapubic/flank pain.  Patient denies any fevers, chills, nausea or vomiting.   She is applying the vaginal estrogen cream three nights weekly.    PMH: Past Medical History:  Diagnosis Date  . Anxiety   . Carbuncle and furuncle of trunk   . HTN (hypertension)   . Hyperlipidemia   . Intrinsic asthma, unspecified   . Methicillin  resistant Staphylococcus aureus in conditions classified elsewhere and of unspecified site   . Other psychological or physical stress, not elsewhere classified(V62.89)   . Tobacco abuse     Surgical History: Past Surgical History:  Procedure Laterality Date  . COLONOSCOPY WITH PROPOFOL N/A 04/10/2015   Procedure: COLONOSCOPY WITH PROPOFOL;  Surgeon: Lucilla Lame, MD;  Location: [facility];  Service: Endoscopy;  Laterality: N/A;  . POLYPECTOMY  04/10/2015   Procedure: POLYPECTOMY;  Surgeon: Lucilla Lame, MD;  Location: [facility];  Service: Endoscopy;;  . STATUS POST ENDOMETRIAL ABLATION    . TONSILLECTOMY    . TUBAL LIGATION     bilateral, sterile ablation  . VARICOSE VEINS      Home Medications:  Allergies as of 05/07/2018      Reactions   Pravastatin Sodium    Myalgia      Medication List        Accurate as of 05/07/18  6:45 AM. Always use your most recent med list.          amLODipine 10 MG tablet Commonly known as:  NORVASC TAKE 1 TABLET BY MOUTH ONCE DAILY   atorvastatin 20 MG tablet Commonly known as:  LIPITOR Take 1 tablet (20 mg total) by mouth daily.   azithromycin 250 MG tablet Commonly known as:  ZITHROMAX Take two pills the first  day then one pill daily   clonazePAM 0.5 MG tablet Commonly known as:  KLONOPIN TAKE 1 TABLET BY MOUTH TWICE DAILY AS NEEDED   conjugated estrogens vaginal cream Commonly known as:  PREMARIN Apply 0.5mg  (pea-sized amount)  just inside the vaginal introitus with a finger-tip on  Monday, Wednesday and Friday nights.   estradiol 0.1 MG/GM vaginal cream Commonly known as:  ESTRACE Apply 0.5mg  (pea-sized amount)  just inside the vaginal introitus with a finger-tip on Monday, Wednesday and Friday nights.   hydrochlorothiazide 25 MG tablet Commonly known as:  HYDRODIURIL TAKE ONE TABLET BY MOUTH ONCE DAILY   HYDROcodone-homatropine 5-1.5 MG/5ML syrup Commonly known as:  HYCODAN Take 5 mLs by mouth every 6 (six)  hours as needed for up to 5 days. 5 ml 4-6 hours as needed for cough   loratadine 10 MG tablet Commonly known as:  CLARITIN Take 10 mg by mouth daily.   MULTIPLE VITAMIN PO Take 1 tablet by mouth daily. Reported on 03/13/2016   predniSONE 20 MG tablet Commonly known as:  DELTASONE One pill twice daily for 5 days   PROVENTIL HFA 108 (90 Base) MCG/ACT inhaler Generic drug:  albuterol INHALE 2 PUFFS INTO LUNGS EVERY 4 HOURS AS NEEDED FOR WHEEZING OR SHORTNESS OF BREATH       Allergies:  Allergies  Allergen Reactions  . Pravastatin Sodium     Myalgia    Family History: Family History  Problem Relation Age of Onset  . Coronary artery disease Mother   . Hypertension Mother   . Rheum arthritis Mother   . Heart attack Mother   . Lung cancer Father   . Emphysema Father   . Gout Father   . Diabetes Sister   . Hypertension Daughter   . Hypertension Daughter   . Obesity Daughter   . Seizures Daughter   . Breast cancer Neg Hx   . Bladder Cancer Neg Hx   . Kidney cancer Neg Hx     Social History:  reports that she quit smoking about 15 months ago. Her smoking use included cigarettes. She has a 15.00 pack-year smoking history. She has never used smokeless tobacco. She reports that she does not drink alcohol or use drugs.  ROS:                                        Physical Exam: There were no vitals taken for this visit.  Constitutional: Well nourished. Alert and oriented, No acute distress. HEENT: [place] AT, moist mucus membranes. Trachea midline, no masses. Cardiovascular: No clubbing, cyanosis, or edema. Respiratory: Normal respiratory effort, no increased work of breathing. Skin: No rashes, bruises or suspicious lesions. Lymph: No cervical or inguinal adenopathy. Neurologic: Grossly intact, no focal deficits, moving all 4 extremities. Psychiatric: Normal mood and affect.  Laboratory Data: Lab Results  Component Value Date   WBC 10.8  06/17/2015   HGB 15.2 06/17/2015   HCT 43.9 06/17/2015   MCV 84.9 06/17/2015   PLT 218 06/17/2015    Lab Results  Component Value Date   CREATININE 0.69 03/24/2018    No results found for: PSA  No results found for: TESTOSTERONE  No results found for: HGBA1C  No results found for: TSH     Component Value Date/Time   CHOL 195 03/24/2018 1045   HDL 50 03/24/2018 1045   CHOLHDL 3.9 03/24/2018 1045   [place]  123 (H) 03/24/2018 1045    Lab Results  Component Value Date   AST 11 03/24/2018   Lab Results  Component Value Date   ALT 9 03/24/2018   No components found for: ALKALINEPHOPHATASE No components found for: BILIRUBINTOTAL  No results found for: ESTRADIOL  Urinalysis    Component Value Date/Time   COLORURINE Yellow 12/19/2014 2255   APPEARANCEUR Clear 12/19/2014 2255   LABSPEC 1.009 12/19/2014 2255   PHURINE 5.0 12/19/2014 2255   GLUCOSEU Negative 12/19/2014 2255   HGBUR 1+ 12/19/2014 2255   BILIRUBINUR snakk 03/24/2018 1036   BILIRUBINUR Negative 12/19/2014 2255   KETONESUR Negative 12/19/2014 2255   PROTEINUR Positive (A) 03/24/2018 1036   PROTEINUR Negative 12/19/2014 2255   UROBILINOGEN 0.2 03/24/2018 1036   NITRITE positive 03/24/2018 1036   NITRITE Negative 12/19/2014 2255   LEUKOCYTESUR Negative 03/24/2018 1036   LEUKOCYTESUR Negative 12/19/2014 2255    I have reviewed the labs.   Pertinent Imaging: ***    Assessment & Plan:    1. Mixed incontinence ***  2. Vaginal atrophy Continue vaginal estrogen cream  She will follow up in three months for an exam.    No follow-ups on file.  These notes generated with voice recognition software. I apologize for typographical errors.  Zara Council, PA-C  [facility] [address] [place],  [address] [phone]

## 2018-05-19 ENCOUNTER — Encounter: Payer: Self-pay | Admitting: Family Medicine

## 2018-05-19 ENCOUNTER — Ambulatory Visit: Payer: BLUE CROSS/BLUE SHIELD | Admitting: Family Medicine

## 2018-05-19 VITALS — BP 130/70 | HR 97 | Temp 98.3°F | Resp 16 | Wt 158.8 lb

## 2018-05-19 DIAGNOSIS — Z23 Encounter for immunization: Secondary | ICD-10-CM

## 2018-05-19 DIAGNOSIS — J449 Chronic obstructive pulmonary disease, unspecified: Secondary | ICD-10-CM | POA: Insufficient documentation

## 2018-05-19 DIAGNOSIS — R062 Wheezing: Secondary | ICD-10-CM | POA: Diagnosis not present

## 2018-05-19 MED ORDER — TIOTROPIUM BROMIDE MONOHYDRATE 18 MCG IN CAPS: 18.0000 ug | ORAL_CAPSULE | Freq: Every day | RESPIRATORY_TRACT | 1 refills | Status: DC

## 2018-05-19 NOTE — Progress Notes (Signed)
  Subjective:     Patient ID: Alexandria [name], female   DOB: 05/28/1956, 62 y.o.   MRN: [id number] Chief Complaint  Patient presents with  . Bronchitis    Patient here for 2 week follow-up Bronchitis. Patient reports that she is doing a lot better. She reports that she still smoking.    HPI States she feels much better today. Here for baseline spirometry and flu shot.  Review of Systems     Objective:   Physical Exam  Constitutional: She appears well-developed and well-nourished. No distress.  Pulmonary/Chest:  Transient inspiratory wheezes bilateral lung fields.       Assessment:    1. Wheezing - Spirometry with Graph - tiotropium (SPIRIVA) 18 MCG inhalation capsule; Place 1 capsule (18 mcg total) into inhaler and inhale daily.  Dispense: 90 capsule; Refill: 1  2. Need for influenza vaccination - Flu Vaccine QUAD 36+ mos IM  3. Chronic obstructive pulmonary disease, unspecified COPD type ([place]): moderate obstruction - tiotropium (SPIRIVA) 18 MCG inhalation capsule; Place 1 capsule (18 mcg total) into inhaler and inhale daily.  Dispense: 90 capsule; Refill: 1    Plan:   Phone f/u  in two weeks regarding medication tolerance. Continue to try to stop smoking. Currently using nicotine lozenges

## 2018-05-19 NOTE — Patient Instructions (Addendum)
Let me know how you are doing in two weeks on the new medication. Continue to try to stop smoking.

## 2018-06-02 ENCOUNTER — Telehealth: Payer: Self-pay | Admitting: Family Medicine

## 2018-06-02 NOTE — Telephone Encounter (Signed)
FYI. KW 

## 2018-06-02 NOTE — Telephone Encounter (Signed)
Pt wanting to let Mikki Santee know the inhaler is working for her.  She is doing a lot better now.  Thanks, [employer]

## 2018-06-13 ENCOUNTER — Other Ambulatory Visit: Payer: Self-pay | Admitting: Family Medicine

## 2018-07-25 ENCOUNTER — Other Ambulatory Visit: Payer: Self-pay | Admitting: Family Medicine

## 2018-09-05 ENCOUNTER — Other Ambulatory Visit: Payer: Self-pay | Admitting: Family Medicine

## 2018-09-07 ENCOUNTER — Other Ambulatory Visit: Payer: Self-pay | Admitting: Family Medicine

## 2018-11-11 ENCOUNTER — Other Ambulatory Visit: Payer: Self-pay | Admitting: Family Medicine

## 2018-11-11 MED ORDER — ATORVASTATIN CALCIUM 20 MG PO TABS: 20.0000 mg | ORAL_TABLET | Freq: Every day | ORAL | 1 refills | Status: DC

## 2018-11-11 NOTE — Telephone Encounter (Signed)
[facility] faxed refill request for the following medications:   atorvastatin (LIPITOR) 20 MG tablet    Please advise.  

## 2018-11-11 NOTE — Telephone Encounter (Signed)
Notes recorded by Carmon Ginsberg, PA on 03/25/2018 at 7:32 AM EDT LDL cholesterol not below 100. Will increase atorvastatin to 20 mg. Other labs ok with mild increase in sugar.

## 2019-03-15 ENCOUNTER — Telehealth: Payer: Self-pay | Admitting: Family Medicine

## 2019-03-15 DIAGNOSIS — F419 Anxiety disorder, unspecified: Secondary | ICD-10-CM | POA: Insufficient documentation

## 2019-03-15 DIAGNOSIS — I10 Essential (primary) hypertension: Secondary | ICD-10-CM

## 2019-03-15 MED ORDER — AMLODIPINE BESYLATE 10 MG PO TABS: 10.0000 mg | ORAL_TABLET | Freq: Every day | ORAL | 0 refills | Status: DC

## 2019-03-15 MED ORDER — CLONAZEPAM 0.5 MG PO TABS: 0.5000 mg | ORAL_TABLET | Freq: Two times a day (BID) | ORAL | 0 refills | Status: DC | PRN

## 2019-03-15 NOTE — Telephone Encounter (Signed)
[facility] faxed refill request for the following medications:  amLODipine (NORVASC) 10 MG tablet  clonazePAM (KLONOPIN) 0.5 MG tablet    Please advise.

## 2019-04-02 ENCOUNTER — Encounter: Payer: Self-pay | Admitting: Physician Assistant

## 2019-04-02 ENCOUNTER — Other Ambulatory Visit: Payer: Self-pay

## 2019-04-02 ENCOUNTER — Ambulatory Visit (INDEPENDENT_AMBULATORY_CARE_PROVIDER_SITE_OTHER): Payer: BLUE CROSS/BLUE SHIELD | Admitting: Physician Assistant

## 2019-04-02 VITALS — BP 125/76 | HR 84 | Temp 97.6°F | Resp 16 | Ht 67.0 in | Wt 185.2 lb

## 2019-04-02 DIAGNOSIS — Z0000 Encounter for general adult medical examination without abnormal findings: Secondary | ICD-10-CM | POA: Diagnosis not present

## 2019-04-02 DIAGNOSIS — J449 Chronic obstructive pulmonary disease, unspecified: Secondary | ICD-10-CM | POA: Diagnosis not present

## 2019-04-02 DIAGNOSIS — I10 Essential (primary) hypertension: Secondary | ICD-10-CM | POA: Diagnosis not present

## 2019-04-02 DIAGNOSIS — Z1239 Encounter for other screening for malignant neoplasm of breast: Secondary | ICD-10-CM

## 2019-04-02 DIAGNOSIS — Z114 Encounter for screening for human immunodeficiency virus [HIV]: Secondary | ICD-10-CM

## 2019-04-02 DIAGNOSIS — Z6829 Body mass index (BMI) 29.0-29.9, adult: Secondary | ICD-10-CM

## 2019-04-02 DIAGNOSIS — F419 Anxiety disorder, unspecified: Secondary | ICD-10-CM

## 2019-04-02 DIAGNOSIS — E782 Mixed hyperlipidemia: Secondary | ICD-10-CM

## 2019-04-02 MED ORDER — AMLODIPINE BESYLATE 10 MG PO TABS: 10.0000 mg | ORAL_TABLET | Freq: Every day | ORAL | 1 refills | Status: DC

## 2019-04-02 MED ORDER — ALBUTEROL SULFATE HFA 108 (90 BASE) MCG/ACT IN AERS: INHALATION_SPRAY | RESPIRATORY_TRACT | 3 refills | Status: DC

## 2019-04-02 MED ORDER — TIOTROPIUM BROMIDE MONOHYDRATE 18 MCG IN CAPS: 18.0000 ug | ORAL_CAPSULE | Freq: Every day | RESPIRATORY_TRACT | 1 refills | Status: DC

## 2019-04-02 MED ORDER — CLONAZEPAM 0.5 MG PO TABS: 0.5000 mg | ORAL_TABLET | Freq: Two times a day (BID) | ORAL | 5 refills | Status: DC | PRN

## 2019-04-02 MED ORDER — HYDROCHLOROTHIAZIDE 25 MG PO TABS: 25.0000 mg | ORAL_TABLET | Freq: Every day | ORAL | 3 refills | Status: DC

## 2019-04-02 MED ORDER — ATORVASTATIN CALCIUM 20 MG PO TABS: 20.0000 mg | ORAL_TABLET | Freq: Every day | ORAL | 1 refills | Status: DC

## 2019-04-02 NOTE — Patient Instructions (Signed)

## 2019-04-02 NOTE — Progress Notes (Signed)
Patient: Alexandria [name], Female    DOB: August 15, 1956, 63 y.o.   MRN: [id number] Visit Date: 04/02/2019  Today's Provider: Mar Daring, PA-C   Chief Complaint  Patient presents with  . Annual Exam   Subjective:     Annual physical exam Alexandria [name] is a 63 y.o. female who presents today for health maintenance and complete physical. She feels well. She reports exercising none. She reports she is sleeping fairly well. ----------------------------------------------------------------- Last pap:03/17/2017 Normal, HPV-Negative  Review of Systems  Constitutional: Negative.   HENT: Positive for tinnitus.   Eyes: Negative.   Respiratory: Negative.   Cardiovascular: Negative.   Gastrointestinal: Negative.   Endocrine: Negative.   Genitourinary: Positive for urgency.  Musculoskeletal: Negative.   Skin: Negative.   Allergic/Immunologic: Negative.   Neurological: Negative.   Hematological: Bruises/bleeds easily.  Psychiatric/Behavioral: Positive for sleep disturbance.    Social History      She  reports that she has been smoking cigarettes. She has a 7.50 pack-year smoking history. She has never used smokeless tobacco. She reports that she does not drink alcohol or use drugs.       Social History   Socioeconomic History  . Marital status: Married    Spouse name: Remo Lipps  . Number of children: 3  . Years of education: 92  . Highest education level: Not on file  Occupational History  . 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 Financial resource strain: Not on file  . Food insecurity    Worry: Not on file    Inability: Not on file  . Transportation needs    Medical: Not on file    Non-medical: Not on file  Tobacco Use  . Smoking status: Current Some Day Smoker    Packs/day: 0.25    Years: 30.00    Pack years: 7.50    Types: Cigarettes    Last attempt to quit: 01/31/2017    Years since quitting: 2.1  . Smokeless tobacco: Never Used   Substance and Sexual Activity  . Alcohol use: No    Alcohol/week: 0.0 standard drinks  . Drug use: No  . Sexual activity: Not on file  Lifestyle  . Physical activity    Days per week: Not on file    Minutes per session: Not on file  . Stress: Not on file  Relationships  . Social Herbalist on phone: Not on file    Gets together: Not on file    Attends religious service: Not on file    Active member of club or organization: Not on file    Attends meetings of clubs or organizations: Not on file    Relationship status: Not on file  Other Topics Concern  . Not on file  Social History Narrative  . Not on file    Past Medical History:  Diagnosis Date  . Anxiety   . Carbuncle and furuncle of trunk   . HTN (hypertension)   . Hyperlipidemia   . Intrinsic asthma, unspecified   . Methicillin resistant Staphylococcus aureus in conditions classified elsewhere and of unspecified site   . Other psychological or physical stress, not elsewhere classified(V62.89)   . Tobacco abuse      Patient Active Problem List   Diagnosis Date Noted  . Anxiety   . Chronic obstructive pulmonary disease ([place]) 05/19/2018  . Diverticulosis 03/17/2017  . Allergic rhinitis 01/29/2017  .  Hx of colonic polyps   . History of anxiety 03/10/2015  . HTN (hypertension)   . Hyperlipidemia     Past Surgical History:  Procedure Laterality Date  . COLONOSCOPY WITH PROPOFOL N/A 04/10/2015   Procedure: COLONOSCOPY WITH PROPOFOL;  Surgeon: Lucilla Lame, MD;  Location: [facility];  Service: Endoscopy;  Laterality: N/A;  . POLYPECTOMY  04/10/2015   Procedure: POLYPECTOMY;  Surgeon: Lucilla Lame, MD;  Location: [facility];  Service: Endoscopy;;  . STATUS POST ENDOMETRIAL ABLATION    . TONSILLECTOMY    . TUBAL LIGATION     bilateral, sterile ablation  . VARICOSE VEINS      Family History        Family Status  Relation Name Status  . Mother  Deceased  . Father  Deceased at age 91  .  Sister  Alive  . Brother  [place]  . Sister  Alive  . Sister  Alive       PACEMAKER  . Daughter  Alive       OBESE AND HYPERTENSION  . Daughter  Alive       OBESE AND HYPERTENSION  . Daughter  Alive       SEIZURES  . Sister  (Not Specified)  . Daughter  (Not Specified)  . Daughter  (Not Specified)  . Daughter  (Not Specified)  . Daughter  (Not Specified)  . Neg Hx  (Not Specified)        Her family history includes Coronary artery disease in her mother; Diabetes in her sister; Emphysema in her father; Gout in her father; Heart attack in her mother; Hypertension in her daughter, daughter, and mother; Lung cancer in her father; Obesity in her daughter; Rheum arthritis in her mother; Seizures in her daughter. There is no history of Breast cancer, Bladder Cancer, or Kidney cancer.      Allergies  Allergen Reactions  . Pravastatin Sodium     Myalgia     Current Outpatient Medications:  .  amLODipine (NORVASC) 10 MG tablet, Take 1 tablet (10 mg total) by mouth daily., Disp: 30 tablet, Rfl: 0 .  atorvastatin (LIPITOR) 20 MG tablet, Take 1 tablet (20 mg total) by mouth daily., Disp: 90 tablet, Rfl: 1 .  clonazePAM (KLONOPIN) 0.5 MG tablet, Take 1 tablet (0.5 mg total) by mouth 2 (two) times daily as needed., Disp: 60 tablet, Rfl: 0 .  hydrochlorothiazide (HYDRODIURIL) 25 MG tablet, TAKE 1 TABLET BY MOUTH ONCE DAILY, Disp: 90 tablet, Rfl: 3 .  loratadine (CLARITIN) 10 MG tablet, Take 10 mg by mouth daily., Disp: , Rfl:  .  MULTIPLE VITAMIN PO, Take 1 tablet by mouth daily. Reported on 03/13/2016, Disp: , Rfl:  .  PROVENTIL HFA 108 (90 Base) MCG/ACT inhaler, INHALE 2 PUFFS INTO LUNGS EVERY 4 HOURS AS NEEDED FOR WHEEZING OR SHORTNESS OF BREATH, Disp: 3 Inhaler, Rfl: 3 .  tiotropium (SPIRIVA) 18 MCG inhalation capsule, Place 1 capsule (18 mcg total) into inhaler and inhale daily., Disp: 90 capsule, Rfl: 1   Patient Care Team: Mar Daring, PA-C as PCP - General  (Family Medicine)    Objective:    Vitals: BP 125/76 (BP Location: Left Arm, Patient Position: Sitting, Cuff Size: Large)   Pulse 84   Temp 97.6 F (36.4 C) (Oral)   Resp 16   Ht 5\' 7"  (1.702 m)   Wt 185 lb 3.2 oz (84 kg)   BMI 29.01  kg/m    Vitals:   04/02/19 0911  BP: 125/76  Pulse: 84  Resp: 16  Temp: 97.6 F (36.4 C)  TempSrc: Oral  Weight: 185 lb 3.2 oz (84 kg)  Height: 5\' 7"  (1.702 m)     Physical Exam Vitals signs reviewed.  Constitutional:      General: She is not in acute distress.    Appearance: Normal appearance. She is well-developed. She is not ill-appearing or diaphoretic.  HENT:     Head: Normocephalic and atraumatic.     Right Ear: Tympanic membrane, ear canal and external ear normal.     Left Ear: Tympanic membrane, ear canal and external ear normal.     Nose: Nose normal.     Mouth/Throat:     Mouth: Mucous membranes are moist.     Pharynx: Oropharynx is clear. No oropharyngeal exudate.  Eyes:     General: No scleral icterus.       Right eye: No discharge.        Left eye: No discharge.     Extraocular Movements: Extraocular movements intact.     Conjunctiva/sclera: Conjunctivae normal.     Pupils: Pupils are equal, round, and reactive to light.  Neck:     Musculoskeletal: Normal range of motion and neck supple.     Thyroid: No thyromegaly.     Vascular: No carotid bruit or JVD.     Trachea: No tracheal deviation.  Cardiovascular:     Rate and Rhythm: Normal rate and regular rhythm.     Pulses: Normal pulses.     Heart sounds: Normal heart sounds. No murmur. No friction rub. No gallop.   Pulmonary:     Effort: Pulmonary effort is normal. No respiratory distress.     Breath sounds: Normal breath sounds. No wheezing or rales.  Chest:     Chest wall: No tenderness.  Abdominal:     General: Abdomen is flat. Bowel sounds are normal. There is no distension.     Palpations: Abdomen is soft. There is no mass.     Tenderness: There is no  abdominal tenderness. There is no guarding or rebound.  Musculoskeletal: Normal range of motion.        General: No tenderness.     Right lower leg: No edema.     Left lower leg: No edema.  Lymphadenopathy:     Cervical: No cervical adenopathy.  Skin:    General: Skin is warm and dry.     Capillary Refill: Capillary refill takes less than 2 seconds.     Findings: No rash.  Neurological:     General: No focal deficit present.     Mental Status: She is alert and oriented to person, place, and time. Mental status is at baseline.     Cranial Nerves: No cranial nerve deficit.     Motor: No weakness.     Coordination: Coordination normal.     Gait: Gait normal.  Psychiatric:        Mood and Affect: Mood normal.        Behavior: Behavior normal.        Thought Content: Thought content normal.        Judgment: Judgment normal.      Depression Screen PHQ 2/9 Scores 04/02/2019 03/24/2018 03/24/2018 03/17/2017  PHQ - 2 Score 2 0 0 0  PHQ- 9 Score 4 - - -       Assessment & Plan:     Routine Health  Maintenance and Physical Exam  Exercise Activities and Dietary recommendations Goals   None     Immunization History  Administered Date(s) Administered  . Influenza,inj,Quad PF,6+ Mos 05/19/2018  . Pneumococcal Polysaccharide-23 03/10/2015  . Tdap 03/17/2017  . Zoster 03/10/2015    Health Maintenance  Topic Date Due  . HIV Screening  03/02/1970  . INFLUENZA VACCINE  04/03/2019  . PAP SMEAR-Modifier  03/17/2020  . COLONOSCOPY  04/09/2020  . MAMMOGRAM  04/10/2020  . TETANUS/TDAP  03/18/2027  . Hepatitis C Screening  Completed     Discussed health benefits of physical activity, and encouraged her to engage in regular exercise appropriate for her age and condition.    1. Annual physical exam Normal physical exam today. Will check labs as below and f/u pending lab results. If labs are stable and WNL she will not need to have these rechecked for one year at her next annual  physical exam. She is to call the office in the meantime if she has any acute issue, questions or concerns. - CBC with Differential/Platelet - Comprehensive metabolic panel - Hemoglobin A1c - Lipid panel - TSH  2. Breast cancer screening Breast exam today was normal. There is no family history of breast cancer. She does perform regular self breast exams. Mammogram was ordered as below. Information for [facility] clinic was given to patient so she may schedule her mammogram at her convenience. - MM 3D SCREEN BREAST BILATERAL; Future  3. Essential hypertension Stable. Diagnosis pulled for medication refill. Continue current medical treatment plan. Will check labs as below and f/u pending results. - CBC with Differential/Platelet - Comprehensive metabolic panel - Hemoglobin A1c - Lipid panel - TSH - amLODipine (NORVASC) 10 MG tablet; Take 1 tablet (10 mg total) by mouth daily.  Dispense: 90 tablet; Refill: 1 - hydrochlorothiazide (HYDRODIURIL) 25 MG tablet; Take 1 tablet (25 mg total) by mouth daily.  Dispense: 90 tablet; Refill: 3  4. Chronic obstructive pulmonary disease, unspecified COPD type ([name]) Stable. Diagnosis pulled for medication refill. Continue current medical treatment plan. Will check labs as below and f/u pending results. - CBC with Differential/Platelet - Comprehensive metabolic panel - Hemoglobin A1c - Lipid panel - TSH - albuterol (PROVENTIL HFA) 108 (90 Base) MCG/ACT inhaler; INHALE 2 PUFFS INTO LUNGS EVERY 4 HOURS AS NEEDED FOR WHEEZING OR SHORTNESS OF BREATH  Dispense: 18 g; Refill: 3 - tiotropium (SPIRIVA) 18 MCG inhalation capsule; Place 1 capsule (18 mcg total) into inhaler and inhale daily.  Dispense: 90 capsule; Refill: 1  5. Mixed hyperlipidemia Stable. Diagnosis pulled for medication refill. Continue current medical treatment plan. Will check labs as below and f/u pending results. - CBC with Differential/Platelet - Comprehensive metabolic panel -  Hemoglobin A1c - Lipid panel - TSH - atorvastatin (LIPITOR) 20 MG tablet; Take 1 tablet (20 mg total) by mouth daily.  Dispense: 90 tablet; Refill: 1  6. Anxiety Stable. Diagnosis pulled for medication refill. Continue current medical treatment plan. - clonazePAM (KLONOPIN) 0.5 MG tablet; Take 1 tablet (0.5 mg total) by mouth 2 (two) times daily as needed.  Dispense: 60 tablet; Refill: 5  7. BMI 29.0-29.9,adult Counseled patient on healthy lifestyle modifications including dieting and exercise.  - CBC with Differential/Platelet - Comprehensive metabolic panel - Hemoglobin A1c - Lipid panel - TSH  8. Screening for HIV without presence of risk factors Will check labs as below and f/u pending results. - HIV Antibody (routine testing w rflx)  --------------------------------------------------------------------    Mar Daring,  PA-C  [facility]

## 2019-04-03 LAB — COMPREHENSIVE METABOLIC PANEL
ALT: 17 IU/L (ref 0–32)
AST: 17 IU/L (ref 0–40)
Albumin/Globulin Ratio: 2.2 (ref 1.2–2.2)
Albumin: 4.6 g/dL (ref 3.8–4.8)
Alkaline Phosphatase: 122 IU/L — ABNORMAL HIGH (ref 39–117)
BUN/Creatinine Ratio: 9 — ABNORMAL LOW (ref 12–28)
BUN: 7 mg/dL — ABNORMAL LOW (ref 8–27)
Bilirubin Total: 0.4 mg/dL (ref 0.0–1.2)
CO2: 28 mmol/L (ref 20–29)
Calcium: 9.9 mg/dL (ref 8.7–10.3)
Chloride: 95 mmol/L — ABNORMAL LOW (ref 96–106)
Creatinine, Ser: 0.75 mg/dL (ref 0.57–1.00)
GFR calc Af Amer: 97 mL/min/{1.73_m2} (ref >59)
GFR calc non Af Amer: 85 mL/min/{1.73_m2} (ref >59)
Globulin, Total: 2.1 g/dL (ref 1.5–4.5)
Glucose: 96 mg/dL (ref 65–99)
Potassium: 3.7 mmol/L (ref 3.5–5.2)
Sodium: 139 mmol/L (ref 134–144)
Total Protein: 6.7 g/dL (ref 6.0–8.5)

## 2019-04-03 LAB — CBC WITH DIFFERENTIAL/PLATELET
Basophils Absolute: 0.1 10*3/uL (ref 0.0–0.2)
Basos: 1 %
EOS (ABSOLUTE): 0.1 10*3/uL (ref 0.0–0.4)
Eos: 1 %
Hematocrit: 45.1 % (ref 34.0–46.6)
Hemoglobin: 15 g/dL (ref 11.1–15.9)
Immature Grans (Abs): 0 10*3/uL (ref 0.0–0.1)
Immature Granulocytes: 0 %
Lymphocytes Absolute: 2.4 10*3/uL (ref 0.7–3.1)
Lymphs: 32 %
MCH: 28.8 pg (ref 26.6–33.0)
MCHC: 33.3 g/dL (ref 31.5–35.7)
MCV: 87 fL (ref 79–97)
Monocytes Absolute: 0.4 10*3/uL (ref 0.1–0.9)
Monocytes: 6 %
Neutrophils Absolute: 4.5 10*3/uL (ref 1.4–7.0)
Neutrophils: 60 %
Platelets: 300 10*3/uL (ref 150–450)
RBC: 5.2 x10E6/uL (ref 3.77–5.28)
RDW: 13 % (ref 11.7–15.4)
WBC: 7.4 10*3/uL (ref 3.4–10.8)

## 2019-04-03 LAB — HEMOGLOBIN A1C
Est. average glucose Bld gHb Est-mCnc: 134 mg/dL
Hgb A1c MFr Bld: 6.3 % — ABNORMAL HIGH (ref 4.8–5.6)

## 2019-04-03 LAB — HIV ANTIBODY (ROUTINE TESTING W REFLEX): HIV Screen 4th Generation wRfx: NONREACTIVE

## 2019-04-03 LAB — LIPID PANEL
Chol/HDL Ratio: 3.7 ratio (ref 0.0–4.4)
Cholesterol, Total: 165 mg/dL (ref 100–199)
HDL: 45 mg/dL (ref >39)
LDL Calculated: 102 mg/dL — ABNORMAL HIGH (ref 0–99)
Triglycerides: 92 mg/dL (ref 0–149)
VLDL Cholesterol Cal: 18 mg/dL (ref 5–40)

## 2019-04-03 LAB — TSH: TSH: 1.46 u[IU]/mL (ref 0.450–4.500)

## 2019-04-06 ENCOUNTER — Telehealth: Payer: Self-pay

## 2019-04-06 NOTE — Telephone Encounter (Signed)
-----   Message from Mar Daring, [place] sent at 04/05/2019  1:28 PM EDT ----- All labs are within normal limits and stable.  Thanks! -JB'

## 2019-04-06 NOTE — Telephone Encounter (Signed)
Patient notified of lab results

## 2019-05-11 ENCOUNTER — Encounter: Payer: Self-pay | Admitting: Emergency Medicine

## 2019-05-11 ENCOUNTER — Other Ambulatory Visit: Payer: Self-pay

## 2019-05-11 ENCOUNTER — Telehealth: Payer: Self-pay

## 2019-05-11 ENCOUNTER — Ambulatory Visit
Admission: EM | Admit: 2019-05-11 | Discharge: 2019-05-11 | Disposition: A | Discharge status: Home / Self Care | Payer: BLUE CROSS/BLUE SHIELD | Attending: Urgent Care | Admitting: Urgent Care

## 2019-05-11 DIAGNOSIS — W540XXA Bitten by dog, initial encounter: Secondary | ICD-10-CM

## 2019-05-11 DIAGNOSIS — S61451A Open bite of right hand, initial encounter: Secondary | ICD-10-CM

## 2019-05-11 MED ORDER — AMOXICILLIN-POT CLAVULANATE 875-125 MG PO TABS: 1.0000 | ORAL_TABLET | Freq: Two times a day (BID) | ORAL | 0 refills | Status: AC

## 2019-05-11 NOTE — Discharge Instructions (Addendum)
It was very nice seeing you today in clinic. Thank you for entrusting me with your care.   Soak hand in warm [place] salt water TWICE a day. Ice and elevate to help with the swelling. Monitor for signs and symptoms of infection, which would include increased redness, swelling, streaking, drainage, pain, and the development of a fever. Please utilize the medications that we discussed. Your prescriptions have been called in to your pharmacy.   Make arrangements to follow up with your regular doctor in 2 days for re-evaluation if not improving. If your symptoms/condition worsens, please seek follow up care either here or in the ER. Please remember, our [facility] providers are "right here with you" when you need [place].   Again, it was my pleasure to take care of you today. Thank you for choosing our clinic. I hope that you start to feel better quickly.   Honor Loh, MSN, APRN, FNP-C, CEN Advanced Practice Provider [facility]

## 2019-05-11 NOTE — Telephone Encounter (Signed)
Patient's [name] called to request a e-visit for patient. Patient's dog bit her hand on Saturday and they think she may have some infection. Is this ok for a e-visit or should patient come in the office?

## 2019-05-11 NOTE — ED Triage Notes (Signed)
Patient states she was bit by her dog on Sunday on her right hand. She states she has been having chills, right hand and finger swelling. Patient stated her dog was dying and the dog has been euthanized.

## 2019-05-11 NOTE — ED Provider Notes (Signed)
[place], [place]   Name: Alexandria [name] DOB: Jul 03, 1956 MRN: [id number] CSN: [id number] PCP: Mar Daring, PA-C  Arrival date and time:  05/11/19 1320  Chief Complaint:  Animal Bite   NOTE: Prior to seeing the patient today, I have reviewed the triage nursing documentation and vital signs. Clinical staff has updated patient's PMH/PSHx, current medication list, and drug allergies/intolerances to ensure comprehensive history available to assist in medical decision making.   History:   HPI: Alexandria [name] is a 63 y.o. female who presents today with complaints of pain and swelling to her RIGHT hand after being bitten by her dog on Sunday (05/09/2019). Animal was in the process of dying and had crawled under the counter. Patient tried to pull animal out to care for it causing the animal to subsequently bite her. Animal was later humanely euthanized that evening. Patient reports that the dog was a Chihuahua-terrier mix that was UTD on all required vaccinations. Patient presents with swelling and tenderness to her hand. Despite, swelling, patient is able to bend her fingers and make a fist. Alexandria [name] reports that her hand has had serosanguinous drainage since yesterday. Alexandria [name] denies any associated fevers, but Alexandria [name] advises that Alexandria [name] has been having some intermittent chills. Patient soaked hand x 1 in warm Epson salt water, which help to improve the swelling.   Past Medical History:  Diagnosis Date   Anxiety    Carbuncle and furuncle of trunk    HTN (hypertension)    Hyperlipidemia    Intrinsic asthma, unspecified    Methicillin resistant Staphylococcus aureus in conditions classified elsewhere and of unspecified site    Other psychological or physical stress, not elsewhere classified(V62.89)    Tobacco abuse     Past Surgical History:  Procedure Laterality Date   COLONOSCOPY WITH PROPOFOL N/A 04/10/2015   Procedure: COLONOSCOPY WITH PROPOFOL;  Surgeon: Lucilla Lame, MD;  Location: [facility];  Service: Endoscopy;  Laterality: N/A;   POLYPECTOMY  04/10/2015   Procedure: POLYPECTOMY;  Surgeon: Lucilla Lame, MD;  Location: [facility];  Service: Endoscopy;;   STATUS POST ENDOMETRIAL ABLATION     TONSILLECTOMY     TUBAL LIGATION     bilateral, sterile ablation   VARICOSE VEINS      Family History  Problem Relation Age of Onset   Coronary artery disease Mother    Hypertension Mother    Rheum arthritis Mother    Heart attack Mother    Lung cancer Father    Emphysema Father    Gout Father    Diabetes Sister    Hypertension Daughter    Hypertension Daughter    Obesity Daughter    Seizures Daughter    Breast cancer Neg Hx    Bladder Cancer Neg Hx    Kidney cancer Neg Hx     Social History   Tobacco Use   Smoking status: Current Some Day Smoker    Packs/day: 0.25    Years: 30.00    Pack years: 7.50    Types: Cigarettes    Last attempt to quit: 01/31/2017    Years since quitting: 2.2   Smokeless tobacco: Never Used  Substance Use Topics   Alcohol use: No    Alcohol/week: 0.0 standard drinks   Drug use: No    Patient Active Problem List   Diagnosis Date Noted   Anxiety    Chronic obstructive pulmonary disease ([place]) 05/19/2018   Diverticulosis 03/17/2017   Allergic rhinitis 01/29/2017  Hx of colonic polyps    History of anxiety 03/10/2015   HTN (hypertension)    Hyperlipidemia     Home Medications:    Current Meds  Medication Sig   albuterol (PROVENTIL HFA) 108 (90 Base) MCG/ACT inhaler INHALE 2 PUFFS INTO LUNGS EVERY 4 HOURS AS NEEDED FOR WHEEZING OR SHORTNESS OF BREATH   amLODipine (NORVASC) 10 MG tablet Take 1 tablet (10 mg total) by mouth daily.   atorvastatin (LIPITOR) 20 MG tablet Take 1 tablet (20 mg total) by mouth daily.   clonazePAM (KLONOPIN) 0.5 MG tablet Take 1 tablet (0.5 mg total) by mouth 2 (two) times daily as needed.   hydrochlorothiazide (HYDRODIURIL) 25 MG tablet Take 1 tablet (25 mg  total) by mouth daily.   loratadine (CLARITIN) 10 MG tablet Take 10 mg by mouth daily.   MULTIPLE VITAMIN PO Take 1 tablet by mouth daily. Reported on 03/13/2016   tiotropium (SPIRIVA) 18 MCG inhalation capsule Place 1 capsule (18 mcg total) into inhaler and inhale daily.    Allergies:   Pravastatin sodium  Review of Systems (ROS): Review of Systems  Constitutional: Positive for chills. Negative for fever.  Gastrointestinal: Negative for diarrhea, nausea and vomiting.  Musculoskeletal:       Acute pain and swelling to RIGHT hand following animal bite  Skin: Positive for wound (animal bite).  All other systems reviewed and are negative.    Vital Signs: Today's Vitals   05/11/19 1336 05/11/19 1338 05/11/19 1437  BP:  (!) 147/79   Pulse:  81   Resp:  18   Temp:  97.6 F (36.4 C)   TempSrc:  Oral   SpO2:  97%   Weight: 180 lb (81.6 kg)    Height: 5\' 7"  (1.702 m)    PainSc: 10-Worst pain ever  10-Worst pain ever    Physical Exam: Physical Exam  Constitutional: Alexandria [name] is oriented to person, place, and time and well-developed, well-nourished, and in no distress.  HENT:  Head: Normocephalic and atraumatic.  Mouth/Throat: Mucous membranes are normal.  Eyes: Pupils are equal, round, and reactive to light. EOM are normal.  Neck: Normal range of motion. Neck supple. No tracheal deviation present.  Cardiovascular: Normal rate and intact distal pulses.  Pulmonary/Chest: Effort normal. No respiratory distress.  Musculoskeletal:     Right hand: Alexandria [name] exhibits tenderness and swelling. Alexandria [name] exhibits normal range of motion, normal two-point discrimination, normal capillary refill and no deformity. Normal sensation noted. Normal strength noted.       Hands:  Neurological: Alexandria [name] is alert and oriented to person, place, and time. Gait normal.  Skin: Skin is warm and dry. No rash noted.  Psychiatric: Mood, memory, affect and judgment normal.  Nursing note and vitals reviewed.     Urgent  Care Treatments / Results:   LABS: PLEASE NOTE: all labs that were ordered this encounter are listed, however only abnormal results are displayed. Labs Reviewed - No data to display  EKG: -None  RADIOLOGY: No results found.  PROCEDURES: Procedures  MEDICATIONS RECEIVED THIS VISIT: Medications - No data to display  PERTINENT CLINICAL COURSE NOTES/UPDATES:   Initial Impression / Assessment and Plan / Urgent Care Course:  Pertinent labs & imaging results that were available during my care of the patient were personally reviewed by me and considered in my medical decision making (see lab/imaging section of note for values and interpretations).  Alexandria [name] is a 63 y.o. female who presents to [facility] today with complaints  of Animal Bite   Patient is well appearing overall in clinic today. Alexandria [name] does not appear to be in any acute distress. Presenting symptoms (see HPI) and exam as documented above. Discussed wound and current degree of swelling. Discussed having patient seen in the emergency department for further evaluation and possible intravenous antibiotics, however patient refused. Patient states, "I don't feel like sitting in the ER for no 7 or 8 hours for this. I want to try antibiotics at home. Especially with this COVID mess going around". Given exam and full ROM of hand, this is reasonable with the caveat being that any increase in symptoms (pain, erythema, swelling, streaking, drainage, or fever) will necessitate further evaluation in the ED. Patient verbalized understanding and consent. Will treat as an outpatient with a 7 day course of Augmentin. Advised to continue hand soaks in warm Epson salt water twice a day. Offered pain medication, however patient advising that Alexandria [name] does not like to take a lot of medications. Recommended APAP and IBU as needed for discomfort.   Discussed follow up with primary care physician in 2 days for re-evaluation. I have reviewed the follow up  and strict return precautions for any new or worsening symptoms. Patient is aware of symptoms that would be deemed urgent/emergent, and would thus require further evaluation either here or in the emergency department. At the time of discharge, Alexandria [name] verbalized understanding and consent with the discharge plan as it was reviewed with her. All questions were fielded by provider and/or clinic staff prior to patient discharge.    Final Clinical Impressions / Urgent Care Diagnoses:   Final diagnoses:  Dog bite, hand, right, initial encounter    New Prescriptions:  [place] Controlled Substance Registry consulted? Not Applicable  Meds ordered this encounter  Medications   amoxicillin-clavulanate (AUGMENTIN) 875-125 MG tablet    Sig: Take 1 tablet by mouth 2 (two) times daily for 7 days.    Dispense:  14 tablet    Refill:  0    Recommended Follow up Care:  Patient encouraged to follow up with the following provider within the specified time frame, or sooner as dictated by the severity of her symptoms. As always, Alexandria [name] was instructed that for any urgent/emergent care needs, Alexandria [name] should seek care either here or in the emergency department for more immediate evaluation.  Follow-up Information    Mar Daring, PA-C In 2 days.   Specialty: Family Medicine Why: General reassessment of symptoms if not improving Contact information: [address] [place] [address] [phone]         NOTE: This note was prepared using Dragon dictation software along with smaller phrase technology. Despite my best ability to proofread, there is the potential that transcriptional errors may still occur from this process, and are completely unintentional.    Karen Kitchens, NP 05/11/19 [phone]

## 2019-05-11 NOTE — Telephone Encounter (Signed)
Patient states her dog bit her right hand Saturday night. Patient states by Sunday her hand started swelling and had pus around the wounds. Patient states by today her hand is swollen twice the normal size, red and pus is oozing out of the wounds. Patient also states she doesn't feel well, she has chills and feels like she has a fever. Advised pt we do not have any appts left today. Advised pt to go to urgent care or ER asap. patietn stated she will go to urgent care.

## 2019-05-11 NOTE — Telephone Encounter (Signed)
Yes agree needs evaluation.

## 2019-05-25 ENCOUNTER — Telehealth: Payer: Self-pay

## 2019-05-25 ENCOUNTER — Ambulatory Visit
Admission: RE | Admit: 2019-05-25 | Discharge: 2019-05-25 | Disposition: A | Discharge status: Home / Self Care | Payer: BLUE CROSS/BLUE SHIELD | Source: Ambulatory Visit | Attending: Physician Assistant | Admitting: Physician Assistant

## 2019-05-25 DIAGNOSIS — Z1239 Encounter for other screening for malignant neoplasm of breast: Secondary | ICD-10-CM

## 2019-05-25 DIAGNOSIS — Z1231 Encounter for screening mammogram for malignant neoplasm of breast: Secondary | ICD-10-CM | POA: Diagnosis not present

## 2019-05-25 NOTE — Telephone Encounter (Signed)
-----   Message from Mar Daring, [place] sent at 05/25/2019  4:32 PM EDT ----- Normal mammogram. Repeat screening in one year.

## 2019-05-25 NOTE — Telephone Encounter (Signed)
Patient advised as directed below. 

## 2019-07-21 ENCOUNTER — Telehealth: Payer: Self-pay

## 2019-07-21 NOTE — Telephone Encounter (Signed)
   Copied from [place] [id number]. Topic: General - Other >> Jul 21, 2019  2:32 PM Sheran Luz wrote: Patient requesting call back from office staff to discuss insurance issue. She states Dr. Mikki Santee" did something to make it to where she was in network. Now she has switched to [facility] and would like to know if she was still able to be seen with her current out of network insurance.

## 2019-10-12 ENCOUNTER — Other Ambulatory Visit: Payer: Self-pay | Admitting: Physician Assistant

## 2019-10-12 DIAGNOSIS — I10 Essential (primary) hypertension: Secondary | ICD-10-CM

## 2019-10-13 NOTE — Telephone Encounter (Signed)
Requested medication (s) are due for refill today: yes  Requested medication (s) are on the active medication list: yes  Last refill:  07/12/2019  Future visit scheduled: no  Notes to clinic:  no valid encounter within last 6 months    Requested Prescriptions  Pending Prescriptions Disp Refills   amLODipine (NORVASC) 10 MG tablet [Pharmacy Med Name: amLODIPine Besylate 10 MG Oral Tablet] 90 tablet 0    Sig: Take 1 tablet by mouth once daily      Cardiovascular:  Calcium Channel Blockers Failed - 10/12/2019  7:19 PM      Failed - Last BP in normal range    BP Readings from Last 1 Encounters:  05/11/19 (!) 147/79          Failed - Valid encounter within last 6 months    Recent Outpatient Visits           6 months ago Annual physical exam   [facility] [place], Clearnce Sorrel, [place]   1 year ago [place], [place]   1 year ago [place], [place]   1 year ago Urinary frequency   [place], [place]   1 year ago Reactive airways dysfunction syndrome, mild intermittent, with acute exacerbation [facility])   [place], [place]

## 2019-11-21 ENCOUNTER — Other Ambulatory Visit: Payer: Self-pay | Admitting: Physician Assistant

## 2019-11-21 DIAGNOSIS — F419 Anxiety disorder, unspecified: Secondary | ICD-10-CM

## 2019-11-21 NOTE — Telephone Encounter (Signed)
Requested medication (s) are due for refill today: yes  Requested medication (s) are on the active medication list: yes  Last refill:  04/02/19  Future visit scheduled: no  Notes to clinic:  medication not delegated to NT to refill   Requested Prescriptions  Pending Prescriptions Disp Refills   clonazePAM (KLONOPIN) 0.5 MG tablet [Pharmacy Med Name: clonazePAM 0.5 MG Oral Tablet] 60 tablet 0    Sig: Take 1 tablet by mouth twice daily as needed      Not Delegated - Psychiatry:  Anxiolytics/Hypnotics Failed - 11/21/2019  9:31 AM      Failed - This refill cannot be delegated      Failed - Urine Drug Screen completed in last 360 days.      Failed - Valid encounter within last 6 months    Recent Outpatient Visits           7 months ago Annual physical exam   [facility] [place], Clearn[place] Sorrel, [place]   1 year ago [place], [place]   1 year ago [place], [place]   1 year ago Urinary frequency   [place], [place]   1 year ago Reactive airways dysfunction syndrome, mild intermittent, with acute exacerbation [facility])   [place], [place]

## 2019-12-02 ENCOUNTER — Other Ambulatory Visit: Payer: Self-pay | Admitting: Physician Assistant

## 2019-12-02 DIAGNOSIS — E782 Mixed hyperlipidemia: Secondary | ICD-10-CM

## 2019-12-09 ENCOUNTER — Encounter: Payer: Self-pay | Admitting: Physician Assistant

## 2019-12-09 ENCOUNTER — Ambulatory Visit (INDEPENDENT_AMBULATORY_CARE_PROVIDER_SITE_OTHER): Payer: 59 | Admitting: Physician Assistant

## 2019-12-09 DIAGNOSIS — R059 Cough, unspecified: Secondary | ICD-10-CM

## 2019-12-09 DIAGNOSIS — R05 Cough: Secondary | ICD-10-CM

## 2019-12-09 DIAGNOSIS — J069 Acute upper respiratory infection, unspecified: Secondary | ICD-10-CM

## 2019-12-09 DIAGNOSIS — J302 Other seasonal allergic rhinitis: Secondary | ICD-10-CM | POA: Diagnosis not present

## 2019-12-09 MED ORDER — MUCINEX 600 MG PO TB12: 600.0000 mg | ORAL_TABLET | Freq: Two times a day (BID) | ORAL | 0 refills | Status: DC

## 2019-12-09 MED ORDER — BENZONATATE 200 MG PO CAPS: 200.0000 mg | ORAL_CAPSULE | Freq: Three times a day (TID) | ORAL | 0 refills | Status: DC | PRN

## 2019-12-09 MED ORDER — FLUTICASONE PROPIONATE 50 MCG/ACT NA SUSP: 2.0000 | Freq: Every day | NASAL | 6 refills | Status: DC

## 2019-12-09 NOTE — Progress Notes (Signed)
Established patient visit      Patient: Alexandria [name]   DOB: 1955-12-22   64 y.o. Female  MRN: [id number] Visit Date: 12/09/2019  Today's healthcare provider: Mar Daring, PA-C  Subjective:    Chief Complaint  Patient presents with  . URI   Virtual Visit via Telephone Note  I connected with Eriah Cham Saliba on 12/09/19 at  3:20 PM EDT by telephone and verified that I am speaking with the correct person using two identifiers.  Location: Patient: Home  Provider: [facility]   I discussed the limitations, risks, security and privacy concerns of performing an evaluation and management service by telephone and the availability of in person appointments. I also discussed with the patient that there may be a patient responsible charge related to this service. The patient expressed understanding and agreed to proceed.  URI  This is a new problem. The current episode started 1 to 4 weeks ago. The problem has been unchanged. There has been no fever. Associated symptoms include congestion, coughing, sneezing and wheezing. Pertinent negatives include no ear pain, headaches, rhinorrhea, sinus pain or vomiting. Sore throat: "dry throat" She has tried inhaler use and antihistamine for the symptoms. The treatment provided no relief.    Patient Active Problem List   Diagnosis Date Noted  . Anxiety   . Chronic obstructive pulmonary disease ([place]) 05/19/2018  . Diverticulosis 03/17/2017  . Allergic rhinitis 01/29/2017  . Hx of colonic polyps   . History of anxiety 03/10/2015  . HTN (hypertension)   . Hyperlipidemia    Past Medical History:  Diagnosis Date  . Anxiety   . Carbuncle and furuncle of trunk   . HTN (hypertension)   . Hyperlipidemia   . Intrinsic asthma, unspecified   . Methicillin resistant Staphylococcus aureus in conditions classified elsewhere and of unspecified site   . Other psychological or physical stress, not elsewhere classified(V62.89)   . Tobacco abuse         Medications: Outpatient Medications Prior to Visit  Medication Sig  . albuterol (PROVENTIL HFA) 108 (90 Base) MCG/ACT inhaler INHALE 2 PUFFS INTO LUNGS EVERY 4 HOURS AS NEEDED FOR WHEEZING OR SHORTNESS OF BREATH  . amLODipine (NORVASC) 10 MG tablet Take 1 tablet by mouth once daily  . atorvastatin (LIPITOR) 20 MG tablet Take 1 tablet by mouth once daily  . clonazePAM (KLONOPIN) 0.5 MG tablet Take 1 tablet by mouth twice daily as needed  . hydrochlorothiazide (HYDRODIURIL) 25 MG tablet Take 1 tablet (25 mg total) by mouth daily.  Marland Kitchen loratadine (CLARITIN) 10 MG tablet Take 10 mg by mouth daily.  . MULTIPLE VITAMIN PO Take 1 tablet by mouth daily. Reported on 03/13/2016  . tiotropium (SPIRIVA) 18 MCG inhalation capsule Place 1 capsule (18 mcg total) into inhaler and inhale daily.   No facility-administered medications prior to visit.    Review of Systems  Constitutional: Positive for fatigue. Negative for chills and fever.  HENT: Positive for congestion, postnasal drip, sinus pressure and sneezing. Negative for ear pain, rhinorrhea and sinus pain. Sore throat: "dry throat"   Respiratory: Positive for cough and wheezing. Negative for chest tightness and shortness of breath.   Gastrointestinal: Negative for vomiting.  Neurological: Negative for headaches.    Last CBC Lab Results  Component Value Date   WBC 7.4 04/02/2019   HGB 15.0 04/02/2019   HCT 45.1 04/02/2019   MCV 87 04/02/2019   MCH 28.8 04/02/2019   RDW 13.0 04/02/2019   PLT  300 [id number]   Last metabolic panel Lab Results  Component Value Date   GLUCOSE 96 04/02/2019   NA 139 04/02/2019   K 3.7 04/02/2019   CL 95 (L) 04/02/2019   CO2 28 04/02/2019   BUN 7 (L) 04/02/2019   CREATININE 0.75 04/02/2019   GFRNONAA 85 04/02/2019   GFRAA 97 04/02/2019   CALCIUM 9.9 04/02/2019   PROT 6.7 04/02/2019   ALBUMIN 4.6 04/02/2019   LABGLOB 2.1 04/02/2019   AGRATIO 2.2 04/02/2019   BILITOT 0.4 04/02/2019   ALKPHOS 122 (H)  04/02/2019   AST 17 04/02/2019   ALT 17 04/02/2019   ANIONGAP 10 06/17/2015        Objective:    There were no vitals taken for this visit. BP Readings from Last 3 Encounters:  05/11/19 (!) 147/79  04/02/19 125/76  05/19/18 130/70      Physical Exam Vitals reviewed.  Constitutional:      General: She is not in acute distress. Pulmonary:     Effort: Pulmonary effort is normal. No respiratory distress.  Neurological:     Mental Status: She is alert.       No results found for any visits on 12/09/19.    Assessment & Plan:     1. Seasonal allergies Suspect symptoms secondary to seasonal allergies. Continue Loratadine. Add mucinex and flonase as below. Tessalon perles will be sent for cough. Use tylenol or IBU prn for fever and body aches. Push fluids. Call if worsening.  - guaiFENesin (MUCINEX) 600 MG 12 hr tablet; Take 1 tablet (600 mg total) by mouth 2 (two) times daily.  Dispense: 60 tablet; Refill: 0 - fluticasone (FLONASE) 50 MCG/ACT nasal spray; Place 2 sprays into both nostrils daily.  Dispense: 16 g; Refill: 6  2. Cough See above medical treatment plan. - benzonatate (TESSALON) 200 MG capsule; Take 1 capsule (200 mg total) by mouth 3 (three) times daily as needed.  Dispense: 30 capsule; Refill: 0  I discussed the assessment and treatment plan with the patient. The patient was provided an opportunity to ask questions and all were answered. The patient agreed with the plan and demonstrated an understanding of the instructions.   The patient was advised to call back or seek an in-person evaluation if the symptoms worsen or if the condition fails to improve as anticipated.  I provided 9 minutes of non-face-to-face time during this encounter.     Rubye Beach  [facility] [phone] (phone) [phone] (fax)  [place]

## 2019-12-10 ENCOUNTER — Encounter: Payer: Self-pay | Admitting: Physician Assistant

## 2019-12-11 ENCOUNTER — Ambulatory Visit (INDEPENDENT_AMBULATORY_CARE_PROVIDER_SITE_OTHER): Payer: 59

## 2019-12-11 ENCOUNTER — Other Ambulatory Visit: Payer: Self-pay

## 2019-12-11 ENCOUNTER — Ambulatory Visit
Admission: EM | Admit: 2019-12-11 | Discharge: 2019-12-11 | Disposition: A | Discharge status: Home / Self Care | Payer: 59 | Attending: Emergency Medicine | Admitting: Emergency Medicine

## 2019-12-11 ENCOUNTER — Inpatient Hospital Stay
Admission: RE | Admit: 2019-12-11 | Discharge: 2019-12-11 | Disposition: A | Discharge status: Home / Self Care | Payer: BLUE CROSS/BLUE SHIELD | Source: Ambulatory Visit

## 2019-12-11 DIAGNOSIS — Z8249 Family history of ischemic heart disease and other diseases of the circulatory system: Secondary | ICD-10-CM | POA: Diagnosis not present

## 2019-12-11 DIAGNOSIS — Z833 Family history of diabetes mellitus: Secondary | ICD-10-CM | POA: Diagnosis not present

## 2019-12-11 DIAGNOSIS — Z79899 Other long term (current) drug therapy: Secondary | ICD-10-CM | POA: Diagnosis not present

## 2019-12-11 DIAGNOSIS — I10 Essential (primary) hypertension: Secondary | ICD-10-CM | POA: Diagnosis not present

## 2019-12-11 DIAGNOSIS — Z8261 Family history of arthritis: Secondary | ICD-10-CM | POA: Diagnosis not present

## 2019-12-11 DIAGNOSIS — Z888 Allergy status to other drugs, medicaments and biological substances status: Secondary | ICD-10-CM | POA: Diagnosis not present

## 2019-12-11 DIAGNOSIS — J449 Chronic obstructive pulmonary disease, unspecified: Secondary | ICD-10-CM | POA: Diagnosis not present

## 2019-12-11 DIAGNOSIS — R059 Cough, unspecified: Secondary | ICD-10-CM

## 2019-12-11 DIAGNOSIS — Z86010 Personal history of colonic polyps: Secondary | ICD-10-CM | POA: Diagnosis not present

## 2019-12-11 DIAGNOSIS — Z801 Family history of malignant neoplasm of trachea, bronchus and lung: Secondary | ICD-10-CM | POA: Insufficient documentation

## 2019-12-11 DIAGNOSIS — R05 Cough: Secondary | ICD-10-CM

## 2019-12-11 DIAGNOSIS — E785 Hyperlipidemia, unspecified: Secondary | ICD-10-CM | POA: Diagnosis not present

## 2019-12-11 DIAGNOSIS — R0602 Shortness of breath: Secondary | ICD-10-CM | POA: Diagnosis not present

## 2019-12-11 DIAGNOSIS — Z20822 Contact with and (suspected) exposure to covid-19: Secondary | ICD-10-CM | POA: Insufficient documentation

## 2019-12-11 DIAGNOSIS — F17210 Nicotine dependence, cigarettes, uncomplicated: Secondary | ICD-10-CM | POA: Insufficient documentation

## 2019-12-11 DIAGNOSIS — J40 Bronchitis, not specified as acute or chronic: Secondary | ICD-10-CM

## 2019-12-11 DIAGNOSIS — F419 Anxiety disorder, unspecified: Secondary | ICD-10-CM | POA: Insufficient documentation

## 2019-12-11 MED ORDER — AMOXICILLIN-POT CLAVULANATE 875-125 MG PO TABS: 1.0000 | ORAL_TABLET | Freq: Two times a day (BID) | ORAL | 0 refills | Status: DC

## 2019-12-11 MED ORDER — PROMETHAZINE-CODEINE 6.25-10 MG/5ML PO SYRP: 5.0000 mL | ORAL_SOLUTION | Freq: Four times a day (QID) | ORAL | 0 refills | Status: AC | PRN

## 2019-12-11 MED ORDER — PREDNISONE 20 MG PO TABS: ORAL_TABLET | ORAL | 0 refills | Status: DC

## 2019-12-11 NOTE — ED Provider Notes (Signed)
[facility]    CSN: [id number] Arrival date & time: 12/11/19  1516      History   Chief Complaint Chief Complaint  Patient presents with  . Cough    HPI Alexandria [name] is a 64 y.o. female.   HPI  64 year old female presents with cough and shortness of breath that she has had for 1 week.  She thought that it was allergies and was taking Zyrtec and Robitussin but did not have any improvement.  Unfortunately her cough and shortness of breath have been increasing.  She states that she will twice a year I have bronchitis.  She is a smoker but is trying to cut back.  She contacted her PCP and was given Tessalon Perles over the phone.  She does take Spiriva and has albuterol HFA but these also are not helping.  He also used her husband's nebulizer last night without any improvement.  Has had no fever or chills.  She has no known sick contacts.  Today she is afebrile pulse 91 respirations 18 blood pressure 155/83 O2 sats are 92% on room air.          Past Medical History:  Diagnosis Date  . Anxiety   . Carbuncle and furuncle of trunk   . HTN (hypertension)   . Hyperlipidemia   . Intrinsic asthma, unspecified   . Methicillin resistant Staphylococcus aureus in conditions classified elsewhere and of unspecified site   . Other psychological or physical stress, not elsewhere classified(V62.89)   . Tobacco abuse     Patient Active Problem List   Diagnosis Date Noted  . Anxiety   . Chronic obstructive pulmonary disease ([place]) 05/19/2018  . Diverticulosis 03/17/2017  . Allergic rhinitis 01/29/2017  . Hx of colonic polyps   . History of anxiety 03/10/2015  . HTN (hypertension)   . Hyperlipidemia     Past Surgical History:  Procedure Laterality Date  . COLONOSCOPY WITH PROPOFOL N/A 04/10/2015   Procedure: COLONOSCOPY WITH PROPOFOL;  Surgeon: Lucilla Lame, MD;  Location: [facility];  Service: Endoscopy;  Laterality: N/A;  . POLYPECTOMY  04/10/2015   Procedure:  POLYPECTOMY;  Surgeon: Lucilla Lame, MD;  Location: [facility];  Service: Endoscopy;;  . STATUS POST ENDOMETRIAL ABLATION    . TONSILLECTOMY    . TUBAL LIGATION     bilateral, sterile ablation  . VARICOSE VEINS      OB History    Gravida  3   Para  3   Term      Preterm      AB      Living        SAB      TAB      Ectopic      Multiple      Live Births               Home Medications    Prior to Admission medications   Medication Sig Start Date End Date Taking? Authorizing Provider  albuterol (PROVENTIL HFA) 108 (90 Base) MCG/ACT inhaler INHALE 2 PUFFS INTO LUNGS EVERY 4 HOURS AS NEEDED FOR WHEEZING OR SHORTNESS OF BREATH 04/02/19  Yes Mar Daring, PA-C  amLODipine (NORVASC) 10 MG tablet Take 1 tablet by mouth once daily 10/13/19  Yes Fenton Malling M, PA-C  atorvastatin (LIPITOR) 20 MG tablet Take 1 tablet by mouth once daily 12/02/19  Yes Burnette, Anderson Malta M, PA-C  benzonatate (TESSALON) 200 MG capsule Take 1 capsule (200 mg total)  by mouth 3 (three) times daily as needed. 12/09/19  Yes Fenton Malling M, PA-C  clonazePAM (KLONOPIN) 0.5 MG tablet Take 1 tablet by mouth twice daily as needed 11/22/19  Yes Mar Daring, PA-C  fluticasone (FLONASE) 50 MCG/ACT nasal spray Place 2 sprays into both nostrils daily. 12/09/19  Yes Mar Daring, PA-C  guaiFENesin (MUCINEX) 600 MG 12 hr tablet Take 1 tablet (600 mg total) by mouth 2 (two) times daily. 12/09/19  Yes Mar Daring, PA-C  hydrochlorothiazide (HYDRODIURIL) 25 MG tablet Take 1 tablet (25 mg total) by mouth daily. 04/02/19  Yes Mar Daring, PA-C  loratadine (CLARITIN) 10 MG tablet Take 10 mg by mouth daily.   Yes [provider]  MULTIPLE VITAMIN PO Take 1 tablet by mouth daily. Reported on 03/13/2016   Yes [provider]  tiotropium (SPIRIVA) 18 MCG inhalation capsule Place 1 capsule (18 mcg total) into inhaler and inhale daily. 04/02/19  Yes Mar Daring, PA-C  amoxicillin-clavulanate (AUGMENTIN) 875-125 MG tablet Take 1 tablet by mouth every 12 (twelve) hours. 12/11/19   Lorin Picket, PA-C  predniSONE (DELTASONE) 20 MG tablet Take 2 tablets (40 mg) daily by mouth 12/11/19   Lorin Picket, PA-C  promethazine-codeine (PHENERGAN WITH CODEINE) 6.25-10 MG/5ML syrup Take 5 mLs by mouth every 6 (six) hours as needed for up to 5 days for cough. 12/11/19 12/16/19  Lorin Picket, PA-C    Family History Family History  Problem Relation Age of Onset  . Coronary artery disease Mother   . Hypertension Mother   . Rheum arthritis Mother   . Heart attack Mother   . Lung cancer Father   . Emphysema Father   . Gout Father   . Diabetes Sister   . Hypertension Daughter   . Hypertension Daughter   . Obesity Daughter   . Seizures Daughter   . Breast cancer Neg Hx   . Bladder Cancer Neg Hx   . Kidney cancer Neg Hx     Social History Social History   Tobacco Use  . Smoking status: Current Some Day Smoker    Packs/day: 0.25    Years: 30.00    Pack years: 7.50    Types: Cigarettes    Last attempt to quit: 01/31/2017    Years since quitting: 2.8  . Smokeless tobacco: Never Used  Substance Use Topics  . Alcohol use: No    Alcohol/week: 0.0 standard drinks  . Drug use: No     Allergies   Pravastatin sodium   Review of Systems Review of Systems  Constitutional: Positive for activity change. Negative for appetite change, chills, fatigue and fever.  Respiratory: Positive for cough, shortness of breath and wheezing.   All other systems reviewed and are negative.    Physical Exam Triage Vital Signs ED Triage Vitals  Enc Vitals Group     BP 12/11/19 1543 (!) 155/83     Pulse Rate 12/11/19 1543 91     Resp 12/11/19 1543 18     Temp 12/11/19 1543 98.3 F (36.8 C)     Temp Source 12/11/19 1543 Oral     SpO2 12/11/19 1543 92 %     Weight --      Height 12/11/19 1541 5\' 7"  (1.702 m)     Head Circumference --      Peak  Flow --      Pain Score 12/11/19 1541 0     Pain Loc --  Pain Edu? --      Excl. in [place]? --    No data found.  Updated Vital Signs BP (!) 155/83 (BP Location: Left Arm)   Pulse 91   Temp 98.3 F (36.8 C) (Oral)   Resp 18   Ht 5\' 7"  (1.702 m)   SpO2 92%   BMI 28.19 kg/m   Visual Acuity Right Eye Distance:   Left Eye Distance:   Bilateral Distance:    Right Eye Near:   Left Eye Near:    Bilateral Near:     Physical Exam   UC Treatments / Results  Labs (all labs ordered are listed, but only abnormal results are displayed) Labs Reviewed  SARS CORONAVIRUS 2 (TAT 6-24 HRS)    EKG   Radiology DG Chest 2 View  Result Date: 12/11/2019 CLINICAL DATA:  Cough, shortness of breath EXAM: CHEST - 2 VIEW COMPARISON:  06/17/2015 FINDINGS: Lungs are clear.  No pleural effusion or pneumothorax. The heart is normal in size. Mild degenerative changes of the mid thoracic spine. IMPRESSION: Normal chest radiographs. Electronically Signed   By: Julian Hy M.D.   On: 12/11/2019 16:22    Procedures Procedures (including critical care time)  Medications Ordered in UC Medications - No data to display  Initial Impression / Assessment and Plan / UC Course  I have reviewed the triage vital signs and the nursing notes.  Pertinent labs & imaging results that were available during my care of the patient were reviewed by me and considered in my medical decision making (see chart for details).   64 year old female presents with a cough and shortness of breath for the past week.  Been worsening over this period of time.  She is out of his allergies and started taking Zyrtec and Robitussin with out much improvement.  She states that she gets bronchitis twice a year.  She is a smoker with a possibility of moderate COPD.  She did consult her PCP but because of the cough was not seen in the office.  She was called in a prescription for [facility] which have not been helpful.  She is  on Spiriva and has albuterol HFA.  She is also been using her husband's nebulizer but has not helped either.  She said no fever chills nausea vomiting diarrhea.  She has had no known sick contacts.  She continues to smoke.  Examination showed scattered inspiratory rhonchi and no wheezing particularly in the left base.  It does appear to have shortness of breath but is able to complete complete sentences.  O2 sats were 92% on room air.  Historically in review of her medical records shows her average PCO2 to be 97-98.  Chest x-ray showed no acute pulmonary disease.  I will place her on Augmentin for 7 days as well as prednisone 40mg  daily for 4 days.  I have given her Phenergan with codeine for her cough.  Have limited to 5days.  If she is not improving I have recommended that she follow-up with her primary care physician or if she worsens she should go immediately to the emergency room.  SARS testing is pending and will be available in approximately 6 to 12 hours.  During that time I asked her to quarantine.   Final Clinical Impressions(s) / UC Diagnoses   Final diagnoses:  Cough  Bronchitis     Discharge Instructions     Continue to use your inhalers as necessary for your coughing and shortness of breath.  If you worsen go to the emergency room.  Stop smoking.  Recommend following up with your primary care physician in the next week or 2.    ED Prescriptions    Medication Sig Dispense Auth. Provider   amoxicillin-clavulanate (AUGMENTIN) 875-125 MG tablet Take 1 tablet by mouth every 12 (twelve) hours. 14 tablet Crecencio Mc P, PA-C   predniSONE (DELTASONE) 20 MG tablet Take 2 tablets (40 mg) daily by mouth 8 tablet Lorin Picket, PA-C   promethazine-codeine (PHENERGAN WITH CODEINE) 6.25-10 MG/5ML syrup Take 5 mLs by mouth every 6 (six) hours as needed for up to 5 days for cough. 100 mL Lorin Picket, PA-C     I have reviewed the PDMP during this encounter.   Lorin Picket, PA-C 12/11/19 1644

## 2019-12-11 NOTE — Discharge Instructions (Signed)
Continue to use your inhalers as necessary for your coughing and shortness of breath.  If you worsen go to the emergency room.  Stop smoking.  Recommend following up with your primary care physician in the next week or 2.

## 2019-12-11 NOTE — ED Triage Notes (Addendum)
Pt presents with c/o cough and shob for the past week. Pt thought it was allergies and was taking Zyrtec and Robitussin without much improvement. Pt states cough and shob have been increasing. She is worried about having bronchitis. Pt did consult her PCP and was given Tessalon, she has been taking those as directed with no improvement. Pt is also on Spiriva and has albuterol HFA, These have not seemed to help. Pt denies fever/chills, n/v/d or other symptoms. Pt denies any known sick contacts. Pt reports hx of recurrent bronchitis with season change.

## 2019-12-12 LAB — SARS CORONAVIRUS 2 (TAT 6-24 HRS): SARS Coronavirus 2: NEGATIVE

## 2019-12-13 ENCOUNTER — Encounter: Payer: Self-pay | Admitting: Physician Assistant

## 2019-12-13 MED ORDER — PREDNISONE 20 MG PO TABS: ORAL_TABLET | ORAL | 0 refills | Status: DC

## 2020-01-06 ENCOUNTER — Ambulatory Visit
Admission: EM | Admit: 2020-01-06 | Discharge: 2020-01-06 | Disposition: A | Discharge status: Home / Self Care | Payer: 59 | Attending: Family Medicine | Admitting: Family Medicine

## 2020-01-06 ENCOUNTER — Encounter: Payer: Self-pay | Admitting: Emergency Medicine

## 2020-01-06 ENCOUNTER — Telehealth: Payer: Self-pay

## 2020-01-06 ENCOUNTER — Other Ambulatory Visit: Payer: Self-pay

## 2020-01-06 DIAGNOSIS — R0602 Shortness of breath: Secondary | ICD-10-CM | POA: Diagnosis present

## 2020-01-06 DIAGNOSIS — F419 Anxiety disorder, unspecified: Secondary | ICD-10-CM | POA: Diagnosis not present

## 2020-01-06 DIAGNOSIS — I10 Essential (primary) hypertension: Secondary | ICD-10-CM | POA: Insufficient documentation

## 2020-01-06 DIAGNOSIS — J302 Other seasonal allergic rhinitis: Secondary | ICD-10-CM

## 2020-01-06 DIAGNOSIS — Z825 Family history of asthma and other chronic lower respiratory diseases: Secondary | ICD-10-CM | POA: Insufficient documentation

## 2020-01-06 DIAGNOSIS — F17210 Nicotine dependence, cigarettes, uncomplicated: Secondary | ICD-10-CM | POA: Diagnosis not present

## 2020-01-06 DIAGNOSIS — Z79899 Other long term (current) drug therapy: Secondary | ICD-10-CM | POA: Insufficient documentation

## 2020-01-06 DIAGNOSIS — Z833 Family history of diabetes mellitus: Secondary | ICD-10-CM | POA: Diagnosis not present

## 2020-01-06 DIAGNOSIS — J441 Chronic obstructive pulmonary disease with (acute) exacerbation: Secondary | ICD-10-CM | POA: Insufficient documentation

## 2020-01-06 DIAGNOSIS — Z20822 Contact with and (suspected) exposure to covid-19: Secondary | ICD-10-CM | POA: Insufficient documentation

## 2020-01-06 DIAGNOSIS — E785 Hyperlipidemia, unspecified: Secondary | ICD-10-CM | POA: Insufficient documentation

## 2020-01-06 DIAGNOSIS — Z8249 Family history of ischemic heart disease and other diseases of the circulatory system: Secondary | ICD-10-CM | POA: Diagnosis not present

## 2020-01-06 MED ORDER — PREDNISONE 10 MG PO TABS
ORAL_TABLET | ORAL | 0 refills | Status: DC
Start: 2020-01-06 — End: 2020-01-13

## 2020-01-06 MED ORDER — BENZONATATE 100 MG PO CAPS
100.0000 mg | ORAL_CAPSULE | Freq: Three times a day (TID) | ORAL | 0 refills | Status: DC | PRN
Start: 2020-01-06 — End: 2020-04-03

## 2020-01-06 MED ORDER — DOXYCYCLINE HYCLATE 100 MG PO CAPS
100.0000 mg | ORAL_CAPSULE | Freq: Two times a day (BID) | ORAL | 0 refills | Status: DC
Start: 2020-01-06 — End: 2020-03-13

## 2020-01-06 NOTE — Telephone Encounter (Signed)
Patient needed a OV for upper respiratory symptoms. Patient advised she would need a virtual appointment due to symptoms. Patient states "well she don't want to see me so I will just go back to Urgent Care."

## 2020-01-06 NOTE — Discharge Instructions (Signed)
Take medication as prescribed. Rest. Drink plenty of fluids. Continue use of albuterol inhaler as needed for wheezing, and continue zyrtec.   Follow up with your primary care physician this week . As discussed, you may need follow up with pulmonology.   Return to Urgent care or ER for increased shortness of breath, chest pain, new or worsening concerns.

## 2020-01-06 NOTE — Telephone Encounter (Signed)
Ok I would see her once we get N95s but we cannot protect staff.

## 2020-01-06 NOTE — Telephone Encounter (Signed)
What is she needing to be seen for?  If it is upper respiratory then it will have to be virtual. If her cough is chronic COPD and she is needing to be seen for other issues then we can do an in office.

## 2020-01-06 NOTE — Telephone Encounter (Signed)
Copied from [place] [id number]. Topic: Appointment Scheduling - Scheduling Inquiry for Clinic >> Jan 06, 2020  1:49 PM Sheran Luz wrote: Patient requesting a call from office staff to consider an in office appointment. Patient cannot pass screening questions but states she has COPD.

## 2020-01-06 NOTE — ED Provider Notes (Signed)
[facility] ____________________________________________  Time seen: Approximately 5:47 PM  I have reviewed the triage vital signs and the nursing notes.   HISTORY  Chief Complaint Shortness of Breath and COPD   HPI Alexandria [name] is a 64 y.o. female presenting for evaluation of cough and shortness of breath present for the last 1 week.  Patient states that this feels consistent with her COPD exacerbations and has flared up in the last week.  Further stating she has been outside and believes her seasonal allergies are contributing to this.  Denies known sick contacts.  Denies fevers.  States cough is sometimes productive.  Denies hemoptysis.  Denies accompanying chest pain.  Has been using her albuterol inhaler which helps but no resolution.  Denies sore throat, sinus pain, chest pain, vomiting, diarrhea, extremity swelling.  Has also intermittently taken Mucinex and Zyrtec without resolution.  Denies other aggravating alleviating factors.  Reports otherwise doing well.  Reports her oxygen is normally saturation around 91-97% when sick.  Mar Daring, PA-C : PCP   Past Medical History:  Diagnosis Date  . Anxiety   . Carbuncle and furuncle of trunk   . HTN (hypertension)   . Hyperlipidemia   . Intrinsic asthma, unspecified   . Methicillin resistant Staphylococcus aureus in conditions classified elsewhere and of unspecified site   . Other psychological or physical stress, not elsewhere classified(V62.89)   . Tobacco abuse     Patient Active Problem List   Diagnosis Date Noted  . Anxiety   . Chronic obstructive pulmonary disease ([place]) 05/19/2018  . Diverticulosis 03/17/2017  . Allergic rhinitis 01/29/2017  . Hx of colonic polyps   . History of anxiety 03/10/2015  . HTN (hypertension)   . Hyperlipidemia     Past Surgical History:  Procedure Laterality Date  . COLONOSCOPY WITH PROPOFOL N/A 04/10/2015   Procedure: COLONOSCOPY WITH PROPOFOL;  Surgeon: Lucilla Lame, MD;  Location: [facility];  Service: Endoscopy;  Laterality: N/A;  . POLYPECTOMY  04/10/2015   Procedure: POLYPECTOMY;  Surgeon: Lucilla Lame, MD;  Location: [facility];  Service: Endoscopy;;  . STATUS POST ENDOMETRIAL ABLATION    . TONSILLECTOMY    . TUBAL LIGATION     bilateral, sterile ablation  . VARICOSE VEINS       No current facility-administered medications for this encounter.  Current Outpatient Medications:  .  albuterol (PROVENTIL HFA) 108 (90 Base) MCG/ACT inhaler, INHALE 2 PUFFS INTO LUNGS EVERY 4 HOURS AS NEEDED FOR WHEEZING OR SHORTNESS OF BREATH, Disp: 18 g, Rfl: 3 .  amLODipine (NORVASC) 10 MG tablet, Take 1 tablet by mouth once daily, Disp: 90 tablet, Rfl: 0 .  atorvastatin (LIPITOR) 20 MG tablet, Take 1 tablet by mouth once daily, Disp: 90 tablet, Rfl: 1 .  clonazePAM (KLONOPIN) 0.5 MG tablet, Take 1 tablet by mouth twice daily as needed, Disp: 60 tablet, Rfl: 0 .  hydrochlorothiazide (HYDRODIURIL) 25 MG tablet, Take 1 tablet (25 mg total) by mouth daily., Disp: 90 tablet, Rfl: 3 .  loratadine (CLARITIN) 10 MG tablet, Take 10 mg by mouth daily., Disp: , Rfl:  .  MULTIPLE VITAMIN PO, Take 1 tablet by mouth daily. Reported on 03/13/2016, Disp: , Rfl:  .  tiotropium (SPIRIVA) 18 MCG inhalation capsule, Place 1 capsule (18 mcg total) into inhaler and inhale daily., Disp: 90 capsule, Rfl: 1 .  benzonatate (TESSALON PERLES) 100 MG capsule, Take 1 capsule (100 mg total) by mouth 3 (three) times daily as needed  for cough., Disp: 20 capsule, Rfl: 0 .  doxycycline (VIBRAMYCIN) 100 MG capsule, Take 1 capsule (100 mg total) by mouth 2 (two) times daily., Disp: 20 capsule, Rfl: 0 .  predniSONE (DELTASONE) 10 MG tablet, Start 60 mg po day one, then 50 mg po day two, taper by 10 mg daily until complete., Disp: 21 tablet, Rfl: 0  Allergies Pravastatin sodium  Family History  Problem Relation Age of Onset  . Coronary artery disease Mother   . Hypertension Mother    . Rheum arthritis Mother   . Heart attack Mother   . Lung cancer Father   . Emphysema Father   . Gout Father   . Diabetes Sister   . Hypertension Daughter   . Hypertension Daughter   . Obesity Daughter   . Seizures Daughter   . Breast cancer Neg Hx   . Bladder Cancer Neg Hx   . Kidney cancer Neg Hx     Social History Social History   Tobacco Use  . Smoking status: Current Every Day Smoker    Packs/day: 0.25    Years: 30.00    Pack years: 7.50    Types: Cigarettes    Last attempt to quit: 01/31/2017    Years since quitting: 2.9  . Smokeless tobacco: Never Used  Substance Use Topics  . Alcohol use: No    Alcohol/week: 0.0 standard drinks  . Drug use: No    Review of Systems Constitutional: No fever ENT: No sore throat. Cardiovascular: Denies chest pain. Respiratory: Positive shortness of breath. Gastrointestinal: No abdominal pain.  No nausea, no vomiting.  No diarrhea.  Musculoskeletal: Negative for back pain. Skin: Negative for rash.   ____________________________________________   PHYSICAL EXAM:  VITAL SIGNS: ED Triage Vitals  Enc Vitals Group     BP 01/06/20 1731 (!) 154/73     Pulse Rate 01/06/20 1731 91     Resp 01/06/20 1731 20     Temp 01/06/20 1731 98.1 F (36.7 C)     Temp Source 01/06/20 1731 Oral     SpO2 01/06/20 1731 93 %     Weight 01/06/20 1727 179 lb 14.3 oz (81.6 kg)     Height 01/06/20 1727 5\' 7"  (1.702 m)     Head Circumference --      Peak Flow --      Pain Score 01/06/20 1727 0     Pain Loc --      Pain Edu? --      Excl. in [place]? --     Constitutional: Alert and oriented. Well appearing and in no acute distress. Eyes: Conjunctivae are normal.  ENT      Head: Normocephalic and atraumatic.  No sinus tenderness to palpation.      Nose: No congestion/      Mouth/Throat: Mucous membranes are moist.Oropharynx non-erythematous. Neck: No stridor. Supple without meningismus.  Hematological/Lymphatic/Immunilogical: No cervical  lymphadenopathy. Cardiovascular: Normal rate, regular rhythm. Grossly normal heart sounds.  Good peripheral circulation. Respiratory: Normal respiratory effort without tachypnea nor retractions.  No rhonchi.  Diffuse inspiratory and expiratory wheezes.  Overall good air movement.  Dry intermittent cough noted in room.  Speaks in complete sentences. Musculoskeletal: Steady gait.  No extremity edema noted. Neurologic:  Normal speech and language. Speech is normal. No gait instability.  Skin:  Skin is warm, dry and intact. No rash noted. Psychiatric: Mood and affect are normal. Speech and behavior are normal. Patient exhibits appropriate insight and judgment   ___________________________________________  LABS (all labs ordered are listed, but only abnormal results are displayed)  Labs Reviewed  SARS CORONAVIRUS 2 (TAT 6-24 HRS)    Radiology Patient was seen on 4/10 and had chest x-ray at that time which was negative.  PROCEDURES Procedures    INITIAL IMPRESSION / ASSESSMENT AND PLAN / ED COURSE  Pertinent labs & imaging results that were available during my care of the patient were reviewed by me and considered in my medical decision making (see chart for details).  Well-appearing patient.  No acute distress.  Presenting for cough and shortness of breath.  Consistent with her normal COPD exacerbation per patient.  Offered to evaluate chest x-ray, patient declined at this time.  Encourage reevaluation chest x-ray if symptoms worsen or do not improve.  COVID-19 testing completed.  Will treat with prednisone, doxycycline and parent Tessalon Perles, continue albuterol inhaler as needed.  Encourage follow-up with primary care as well as potential evaluation by pulmonology.  Continue antihistamine.Discussed indication, risks and benefits of medications with patient.   Discussed follow up with Primary care physician this week. Discussed follow up and return parameters including no resolution or  any worsening concerns. Patient verbalized understanding and agreed to plan.   ____________________________________________   FINAL CLINICAL IMPRESSION(S) / ED DIAGNOSES  Final diagnoses:  COPD exacerbation ([place])  Seasonal allergies     ED Discharge Orders         Ordered    predniSONE (DELTASONE) 10 MG tablet     01/06/20 1749    doxycycline (VIBRAMYCIN) 100 MG capsule  2 times daily     01/06/20 1749    benzonatate (TESSALON PERLES) 100 MG capsule  3 times daily PRN     01/06/20 1749           Note: This dictation was prepared with Dragon dictation along with smaller phrase technology. Any transcriptional errors that result from this process are unintentional.         Marylene Land, NP 01/06/20 [phone]

## 2020-01-06 NOTE — ED Triage Notes (Signed)
Pt c/o SHOB, cough, wheezing. She states she only improved for about a week after her last visit. Pt has COPD. Denies fever or chills.

## 2020-01-07 LAB — SARS CORONAVIRUS 2 (TAT 6-24 HRS): SARS Coronavirus 2: NEGATIVE

## 2020-01-09 ENCOUNTER — Other Ambulatory Visit: Payer: Self-pay | Admitting: Physician Assistant

## 2020-01-09 DIAGNOSIS — F419 Anxiety disorder, unspecified: Secondary | ICD-10-CM

## 2020-01-09 DIAGNOSIS — I10 Essential (primary) hypertension: Secondary | ICD-10-CM

## 2020-01-09 NOTE — Telephone Encounter (Signed)
Requested Prescriptions  Pending Prescriptions Disp Refills  . amLODipine (NORVASC) 10 MG tablet [Pharmacy Med Name: amLODIPine Besylate 10 MG Oral Tablet] 30 tablet 0    Sig: TAKE 1 TABLET BY MOUTH ONCE DAILY. NEED APPOINTMENT FOR REFILL     Cardiovascular:  Calcium Channel Blockers Failed - 01/09/2020 10:30 AM      Failed - Last BP in normal range    BP Readings from Last 1 Encounters:  01/06/20 (!) 154/73         Passed - Valid encounter within last 6 months    Recent Outpatient Visits          1 month ago Seasonal allergies   [facility] Fenton Malling M, [place]   9 months ago Annual physical exam   [place], Clearnce Sorrel, [place]   1 year ago [employer], [place]   1 year ago [place], [place]   1 year ago Urinary frequency   [facility] [place], [place], [place]             . clonazePAM (KLONOPIN) 0.5 MG tablet [Pharmacy Med Name: clonazePAM 0.5 MG Oral Tablet] 60 tablet     Sig: Take 1 tablet by mouth twice daily as needed     Not Delegated - Psychiatry:  Anxiolytics/Hypnotics Failed - 01/09/2020 10:30 AM      Failed - This refill cannot be delegated      Failed - Urine Drug Screen completed in last 360 days.      Passed - Valid encounter within last 6 months    Recent Outpatient Visits          1 month ago Seasonal allergies   [facility] Mar Daring, [place]   9 months ago Annual physical exam   [place], Clearnce Sorrel, [place]   1 year ago [place], [place]   1 year ago [place], [place]   1 year ago Urinary frequency   [place], [place]

## 2020-01-09 NOTE — Telephone Encounter (Signed)
Requested medication (s) are due for refill today: yes  Requested medication (s) are on the active medication list: yes  Last refill:  11/22/19  Future visit scheduled: yes  Notes to clinic:  med not delegated to NT to RF   Requested Prescriptions  Pending Prescriptions Disp Refills   clonazePAM (KLONOPIN) 0.5 MG tablet [Pharmacy Med Name: clonazePAM 0.5 MG Oral Tablet] 60 tablet     Sig: Take 1 tablet by mouth twice daily as needed      Not Delegated - Psychiatry:  Anxiolytics/Hypnotics Failed - 01/09/2020 10:30 AM      Failed - This refill cannot be delegated      Failed - Urine Drug Screen completed in last 360 days.      Passed - Valid encounter within last 6 months    Recent Outpatient Visits           1 month ago Seasonal allergies   [facility] Mar Daring, [place]   9 months ago Annual physical exam   [place], Clearnce Sorrel, [place]   1 year ago [place], [place]   1 year ago [place], [place]   1 year ago Urinary frequency   [place], [place]               Signed Prescriptions Disp Refills   amLODipine (NORVASC) 10 MG tablet 30 tablet 0    Sig: TAKE 1 TABLET BY MOUTH ONCE DAILY. NEED APPOINTMENT FOR REFILL      Cardiovascular:  Calcium Channel Blockers Failed - 01/09/2020 10:30 AM      Failed - Last BP in normal range    BP Readings from Last 1 Encounters:  01/06/20 (!) 154/73          Passed - Valid encounter within last 6 months    Recent Outpatient Visits           1 month ago Seasonal allergies   [facility] Mar Daring, [place]   9 months ago Annual physical exam   [place], Clearnce Sorrel, [place]   1 year ago [place], [place]   1 year ago [place], [place]   1 year  ago Urinary frequency   [place], [place]

## 2020-01-10 NOTE — Telephone Encounter (Signed)
LOV  12/09/19 Acute. 03/2019 PE  LRF  11/22/19  # 60

## 2020-01-13 ENCOUNTER — Encounter: Payer: Self-pay | Admitting: Physician Assistant

## 2020-01-13 ENCOUNTER — Telehealth (INDEPENDENT_AMBULATORY_CARE_PROVIDER_SITE_OTHER): Payer: 59 | Admitting: Physician Assistant

## 2020-01-13 DIAGNOSIS — J418 Mixed simple and mucopurulent chronic bronchitis: Secondary | ICD-10-CM

## 2020-01-13 DIAGNOSIS — Z792 Long term (current) use of antibiotics: Secondary | ICD-10-CM | POA: Diagnosis not present

## 2020-01-13 MED ORDER — FLUTICASONE FUROATE-VILANTEROL 200-25 MCG/INH IN AEPB: 1.0000 | INHALATION_SPRAY | Freq: Every day | RESPIRATORY_TRACT | 5 refills | Status: DC

## 2020-01-13 NOTE — Progress Notes (Signed)
MyChart Video Visit    Virtual Visit via Video Note   This visit type was conducted due to national recommendations for restrictions regarding the COVID-19 Pandemic (e.g. social distancing) in an effort to limit this patient's exposure and mitigate transmission in our community. This patient is at least at moderate risk for complications without adequate follow up. This format is felt to be most appropriate for this patient at this time. Physical exam was limited by quality of the video and audio technology used for the visit.   Patient location: Home Provider location: [facility]   Patient: Alexandria [name]   DOB: 06-28-1956   64 y.o. Female  MRN: [id number] Visit Date: 01/13/2020  Today's healthcare provider: Mar Daring, PA-C   Chief Complaint  Patient presents with  . Transitions Of Care  . COPD   Subjective    HPI Transitions of care / Urgent Care follow up:  Patient was seen at an urgent care for cough and shortness of breath on 01/06/2020. She was treated for COPD exacerbation. Treatment for this included starting prednisone, doxycycline and  Tessalon Perles. She was advised to continue albuterol inhaler as needed along with antihistamine. Patient was told to follow-up with primary care as well as potential evaluation by pulmonology.   She reports good compliance with treatment. She has completed all doses of prednisone. She is still taking Doxycycline and will finish the last dose in 3 days. She reports this condition is Improved. -----------------------------------------------------------------------------------------   Patient Active Problem List   Diagnosis Date Noted  . Anxiety   . Chronic obstructive pulmonary disease ([place]) 05/19/2018  . Diverticulosis 03/17/2017  . Allergic rhinitis 01/29/2017  . Hx of colonic polyps   . History of anxiety 03/10/2015  . HTN (hypertension)   . Hyperlipidemia    Past Medical History:  Diagnosis Date  . Anxiety   . Carbuncle  and furuncle of trunk   . HTN (hypertension)   . Hyperlipidemia   . Intrinsic asthma, unspecified   . Methicillin resistant Staphylococcus aureus in conditions classified elsewhere and of unspecified site   . Other psychological or physical stress, not elsewhere classified(V62.89)   . Tobacco abuse       Medications: Outpatient Medications Prior to Visit  Medication Sig  . albuterol (PROVENTIL HFA) 108 (90 Base) MCG/ACT inhaler INHALE 2 PUFFS INTO LUNGS EVERY 4 HOURS AS NEEDED FOR WHEEZING OR SHORTNESS OF BREATH  . amLODipine (NORVASC) 10 MG tablet TAKE 1 TABLET BY MOUTH ONCE DAILY. NEED APPOINTMENT FOR REFILL  . atorvastatin (LIPITOR) 20 MG tablet Take 1 tablet by mouth once daily  . benzonatate (TESSALON PERLES) 100 MG capsule Take 1 capsule (100 mg total) by mouth 3 (three) times daily as needed for cough.  . clonazePAM (KLONOPIN) 0.5 MG tablet Take 1 tablet by mouth twice daily as needed  . doxycycline (VIBRAMYCIN) 100 MG capsule Take 1 capsule (100 mg total) by mouth 2 (two) times daily.  . hydrochlorothiazide (HYDRODIURIL) 25 MG tablet Take 1 tablet (25 mg total) by mouth daily.  Marland Kitchen loratadine (CLARITIN) 10 MG tablet Take 10 mg by mouth daily.  . MULTIPLE VITAMIN PO Take 1 tablet by mouth daily. Reported on 03/13/2016  . tiotropium (SPIRIVA) 18 MCG inhalation capsule Place 1 capsule (18 mcg total) into inhaler and inhale daily.  . [DISCONTINUED] predniSONE (DELTASONE) 10 MG tablet Start 60 mg po day one, then 50 mg po day two, taper by 10 mg daily until complete. (Patient not taking: Reported  on 01/13/2020)   No facility-administered medications prior to visit.    Last CBC Lab Results  Component Value Date   WBC 7.4 04/02/2019   HGB 15.0 04/02/2019   HCT 45.1 04/02/2019   MCV 87 04/02/2019   MCH 28.8 04/02/2019   RDW 13.0 04/02/2019   PLT 300 [id number]   Last metabolic panel Lab Results  Component Value Date   GLUCOSE 96 04/02/2019   NA 139 04/02/2019   K 3.7  04/02/2019   CL 95 (L) 04/02/2019   CO2 28 04/02/2019   BUN 7 (L) 04/02/2019   CREATININE 0.75 04/02/2019   GFRNONAA 85 04/02/2019   GFRAA 97 04/02/2019   CALCIUM 9.9 04/02/2019   PROT 6.7 04/02/2019   ALBUMIN 4.6 04/02/2019   LABGLOB 2.1 04/02/2019   AGRATIO 2.2 04/02/2019   BILITOT 0.4 04/02/2019   ALKPHOS 122 (H) 04/02/2019   AST 17 04/02/2019   ALT 17 04/02/2019   ANIONGAP 10 06/17/2015      Review of Systems  Constitutional: Negative for appetite change, chills, fatigue and fever.  Respiratory: Positive for cough (much improved). Negative for chest tightness, shortness of breath and wheezing.   Cardiovascular: Negative for chest pain and palpitations.  Gastrointestinal: Negative for abdominal pain, nausea and vomiting.  Neurological: Negative for dizziness and weakness.       Objective    There were no vitals taken for this visit. BP Readings from Last 3 Encounters:  01/06/20 (!) 154/73  12/11/19 (!) 155/83  05/11/19 (!) 147/79   Wt Readings from Last 3 Encounters:  01/06/20 179 lb 14.3 oz (81.6 kg)  05/11/19 180 lb (81.6 kg)  04/02/19 185 lb 3.2 oz (84 kg)      Physical Exam Vitals reviewed.  Constitutional:      General: She is not in acute distress.    Appearance: Normal appearance. She is well-developed. She is not ill-appearing.  HENT:     Head: Normocephalic and atraumatic.  Pulmonary:     Effort: Pulmonary effort is normal. No respiratory distress.  Musculoskeletal:     Cervical back: Normal range of motion and neck supple.  Neurological:     Mental Status: She is alert.  Psychiatric:        Mood and Affect: Mood normal.        Behavior: Behavior normal.        Thought Content: Thought content normal.        Judgment: Judgment normal.        Assessment & Plan     1. Mixed simple and mucopurulent chronic bronchitis (HCC) Stop Spiriva. Start Breo as below. Continue Albuterol prn. Referral placed to Pulmonologist for COPD optimization.  Patient in agreement with referral just states not [place]. Advised to call if symptoms progress or start to worsen again.  - fluticasone furoate-vilanterol (BREO ELLIPTA) 200-25 MCG/INH AEPB; Inhale 1 puff into the lungs daily.  Dispense: 60 each; Refill: 5 - Ambulatory referral to Pulmonology   No follow-ups on file.     I discussed the assessment and treatment plan with the patient. The patient was provided an opportunity to ask questions and all were answered. The patient agreed with the plan and demonstrated an understanding of the instructions.   The patient was advised to call back or seek an in-person evaluation if the symptoms worsen or if the condition fails to improve as anticipated.  I provided 10 minutes of non-face-to-face time during this encounter.  IMar Daring, PA-C, have  reviewed all documentation for this visit. The documentation on 01/14/20 for the exam, diagnosis, procedures, and orders are all accurate and complete.  [place]ye [place] [facility] [phone] (phone) [phone] (fax)  [place]

## 2020-01-14 ENCOUNTER — Encounter: Payer: Self-pay | Admitting: Physician Assistant

## 2020-01-14 NOTE — Patient Instructions (Signed)
Breo/Fluticasone; Vilanterol inhalation powder What is this medicine? FLUTICASONE; VILANTEROL (floo TIK a sone; vye LAN ter ol) inhalation is a combination of two medicines that decrease inflammation and help to open up the airways of your lungs. It is for chronic obstructive pulmonary disease (COPD), including chronic bronchitis or emphysema. It is also used for asthma in adults to help control symptoms. Do NOT use for an acute asthma attack or COPD attack. This medicine may be used for other purposes; ask your health care provider or pharmacist if you have questions. COMMON BRAND NAME(S): BREO ELLIPTA What should I tell my health care provider before I take this medicine? They need to know if you have any of these conditions:  bone problems  diabetes  eye disease, vision problems  immune system problems  heart disease or irregular heartbeat  high blood pressure  infection  pheochromocytoma  seizures  thyroid disease  an unusual or allergic reaction to fluticasone, vilanterol, milk proteins, corticosteroids, other medicines, foods, dyes, or preservatives  pregnant or trying to get pregnant  breast-feeding How should I use this medicine? This medicine is inhaled through the mouth. It is used once per day. Follow the directions on the prescription label. Do not use a spacer device with this inhaler. Take your medicine at regular intervals. Do not take your medicine more often than directed. Do not stop taking except on your doctor's advice. Make sure that you are using your inhaler correctly. Ask you doctor or health care provider if you have any questions. A special MedGuide will be given to you by the pharmacist with each prescription and refill. Be sure to read this information carefully each time. Talk to your pediatrician regarding the use of this medicine in children. Special care may be needed. This medicine is not approved for use in children under 76 years of  age. Overdosage: If you think you have taken too much of this medicine contact a poison control center or emergency room at once. NOTE: This medicine is only for you. Do not share this medicine with others. What if I miss a dose? If you miss a dose, use it as soon as you can. If it is almost time for your next dose, use only that dose and continue with your regular schedule. Do not use double or extra doses. What may interact with this medicine? Do not take this medicine with any of the following medications:  cisapride  dofetilide  dronedarone  MAOIs like Carbex, Eldepryl, Marplan, Nardil, and Parnate  pimozide  thioridazine  ziprasidone This medicine may also interact with the following medications:  antiviral medicines for HIV or AIDS  beta-blockers like metoprolol and propranolol  certain medicines for depression, anxiety, or psychotic disturbances  certain medicines for fungal infections like ketoconazole, itraconazole, posaconazole, voriconazole  conivaptan  diuretics  medicines for colds  nefazodone  other medicines for breathing problems  other medicines that prolong the QT interval (cause an abnormal heart rhythm) This list may not describe all possible interactions. Give your health care provider a list of all the medicines, herbs, non-prescription drugs, or dietary supplements you use. Also tell them if you smoke, drink alcohol, or use illegal drugs. Some items may interact with your medicine. What should I watch for while using this medicine? Visit your doctor or health care professional for regular checkups. Tell your doctor or health care professional if your symptoms do not get better. Do not use this medicine more than once every 24 hours. NEVER use  this medicine for an acute asthma or COPD attack. You should use your short-acting rescue inhalers for this purpose. If your symptoms get worse or if you need your short-acting inhalers more often, call your  doctor right away. If you are going to have surgery tell your doctor or health care professional that you are using this medicine. Try not to come in contact with people with the chicken pox or measles. If you do, call your doctor. This medicine may increase blood sugar. Ask your healthcare provider if changes in diet or medicines are needed if you have diabetes. What side effects may I notice from receiving this medicine? Side effects that you should report to your doctor or health care professional as soon as possible:  allergic reactions like skin rash or hives, swelling of the face, lips, or tongue  breathing problems right after inhaling your medicine  chest pain  fast, irregular heartbeat  feeling faint or lightheaded, falls  fever or chills  nausea, vomiting  signs and symptoms of high blood sugar such as being more thirsty or hungry or having to urinate more than normal. You may also feel very tired or have blurry vision. Side effects that usually do not require medical attention (report to your doctor or health care professional if they continue or are bothersome):  cough  headache  nervousness  sore throat  tremor This list may not describe all possible side effects. Call your doctor for medical advice about side effects. You may report side effects to FDA at 1-800-FDA-1088. Where should I keep my medicine? Keep out of the reach of children. Store at room temperature between 15 and 30 degrees C (59 and 86 degrees F). Store in a dry place away from direct heat or sunlight. Throw away 6 weeks after you remove the inhaler from the foil tray, or after the dose indicator reads 0, whichever comes first. Throw away any unopened packages after the expiration date. NOTE: This sheet is a summary. It may not cover all possible information. If you have questions about this medicine, talk to your doctor, pharmacist, or health care provider.  2020 Elsevier/Gold Standard (2018-05-20  11:19:39)

## 2020-02-10 ENCOUNTER — Telehealth: Payer: Self-pay

## 2020-02-10 DIAGNOSIS — J418 Mixed simple and mucopurulent chronic bronchitis: Secondary | ICD-10-CM

## 2020-02-10 MED ORDER — FLUTICASONE-SALMETEROL 250-50 MCG/DOSE IN AEPB: 1.0000 | INHALATION_SPRAY | Freq: Two times a day (BID) | RESPIRATORY_TRACT | 3 refills | Status: DC

## 2020-02-10 NOTE — Telephone Encounter (Signed)
Copied from [place] [id number]. Topic: General - Inquiry >> Feb 10, 2020 11:05 AM Percell Belt A wrote: Reason for CRM: pt called in and stated that she can not afford the fluticasone furoate-vilanterol (BREO ELLIPTA) 200-25 MCG/INH AEPB .  She Stated it was $47 with ins  She would like to know if there is something else?

## 2020-02-10 NOTE — Telephone Encounter (Signed)
Spoke to patient and she said that her breo has not lasted her two months and that she does not have many puffs left and its only been a month. She would like to see if you could check and see how much the Advair will cost.

## 2020-02-10 NOTE — Telephone Encounter (Signed)
$  47 is actually a good price for the inhaler, but I understand her not being able to afford this. Plus the Breo should last you for 2 months so it would be $47 for 2 months not just one  I could try to see if advair is any cheaper, but I doubt it.

## 2020-02-10 NOTE — Telephone Encounter (Signed)
It should have #60 puffs and use once daily.   However, I have sent Advair to her pharmacy to see how much it cost.

## 2020-02-13 ENCOUNTER — Other Ambulatory Visit: Payer: Self-pay | Admitting: Physician Assistant

## 2020-02-13 DIAGNOSIS — I10 Essential (primary) hypertension: Secondary | ICD-10-CM

## 2020-03-05 ENCOUNTER — Other Ambulatory Visit: Payer: Self-pay | Admitting: Physician Assistant

## 2020-03-05 DIAGNOSIS — I10 Essential (primary) hypertension: Secondary | ICD-10-CM

## 2020-03-05 NOTE — Telephone Encounter (Signed)
Requested Prescriptions  Pending Prescriptions Disp Refills  . amLODipine (NORVASC) 10 MG tablet [Pharmacy Med Name: amLODIPine Besylate 10 MG Oral Tablet] 30 tablet 0    Sig: TAKE 1 TABLET BY MOUTH ONCE DAILY *NEED  APPOINTMENT  BEFORE  FUTURE  REFILLS*     Cardiovascular:  Calcium Channel Blockers Failed - 03/05/2020  6:30 AM      Failed - Last BP in normal range    BP Readings from Last 1 Encounters:  01/06/20 (!) 154/73         Passed - Valid encounter within last 6 months    Recent Outpatient Visits          1 month ago Mixed simple and mucopurulent chronic bronchitis [facility])   [place], [place]   2 months ago Seasonal allergies   [facility] [place], [place]   11 months ago Annual physical exam   [facility] [place], Clearnce Sorrel, [place]   1 year ago [place], [place]   1 year ago [place], [place]      Future Appointments            In 4 weeks Marlyn Corporal, Clearnce Sorrel, PA-C [facility], [facility]           Patient had video visit with provider on 02/13/2020.  BP recorded above was obtained when patient seen in ER for COPD exacerbation.

## 2020-03-08 ENCOUNTER — Other Ambulatory Visit: Payer: Self-pay | Admitting: Physician Assistant

## 2020-03-08 DIAGNOSIS — I10 Essential (primary) hypertension: Secondary | ICD-10-CM

## 2020-03-08 DIAGNOSIS — J449 Chronic obstructive pulmonary disease, unspecified: Secondary | ICD-10-CM

## 2020-03-08 MED ORDER — AMLODIPINE BESYLATE 10 MG PO TABS: ORAL_TABLET | ORAL | 0 refills | Status: DC

## 2020-03-08 MED ORDER — ALBUTEROL SULFATE HFA 108 (90 BASE) MCG/ACT IN AERS: INHALATION_SPRAY | RESPIRATORY_TRACT | 3 refills | Status: DC

## 2020-03-08 NOTE — Telephone Encounter (Signed)
[facility] faxed refill request for the following medications:  albuterol HFA (PA) amLODipine tab   Please advise.  Thanks, [employer]

## 2020-03-08 NOTE — Telephone Encounter (Signed)
Patient has a follow up appointment scheduled for 8/2

## 2020-03-08 NOTE — Telephone Encounter (Signed)
Pt is requesting a refill on AmLODipine 10 mg tab.  [address] [place] [facility] contact # [phone]

## 2020-03-08 NOTE — Telephone Encounter (Signed)
Called pharmacy, refill is available at preferred pharmacy. Attempted to reach pt, "Call cannot be completed at this time."

## 2020-03-08 NOTE — Telephone Encounter (Signed)
LMTCB-received a refill request from [facility] for Amlodipine and Albuterol. If patient calls back ok for [facility] nurse to let her know that Amlodipine was sent to [place] per her request and if she wants the Albuterol inhaler to go to [place] or [facility].

## 2020-03-09 MED ORDER — AMLODIPINE BESYLATE 10 MG PO TABS: ORAL_TABLET | ORAL | 0 refills | Status: DC

## 2020-03-09 MED ORDER — ALBUTEROL SULFATE HFA 108 (90 BASE) MCG/ACT IN AERS: INHALATION_SPRAY | RESPIRATORY_TRACT | 3 refills | Status: DC

## 2020-03-09 NOTE — Addendum Note (Signed)
Addended by: Randal Buba on: 03/09/2020 04:38 PM   Modules accepted: Orders

## 2020-03-09 NOTE — Addendum Note (Signed)
Addended by: Jules Schick on: 03/09/2020 03:46 PM   Modules accepted: Orders

## 2020-03-09 NOTE — Telephone Encounter (Signed)
Patient called back to say that she want Rx for albuterol (PROVENTIL HFA) 108 (90 Base) MCG/ACT inhaler and amLODipine (NORVASC) 10 MG tablet  want them sent to  [place], [place], [address] Phone:  [phone]  Fax:  [phone]

## 2020-03-13 ENCOUNTER — Other Ambulatory Visit: Payer: Self-pay

## 2020-03-13 ENCOUNTER — Other Ambulatory Visit
Admission: RE | Admit: 2020-03-13 | Discharge: 2020-03-13 | Disposition: A | Discharge status: Home / Self Care | Payer: Medicare Other | Attending: Pulmonary Disease | Admitting: Pulmonary Disease

## 2020-03-13 ENCOUNTER — Ambulatory Visit: Payer: Medicare Other | Admitting: Pulmonary Disease

## 2020-03-13 ENCOUNTER — Encounter: Payer: Self-pay | Admitting: Pulmonary Disease

## 2020-03-13 VITALS — BP 126/80 | HR 90 | Temp 97.8°F | Ht 67.0 in | Wt 186.0 lb

## 2020-03-13 DIAGNOSIS — J302 Other seasonal allergic rhinitis: Secondary | ICD-10-CM

## 2020-03-13 DIAGNOSIS — J42 Unspecified chronic bronchitis: Secondary | ICD-10-CM | POA: Diagnosis present

## 2020-03-13 DIAGNOSIS — R05 Cough: Secondary | ICD-10-CM

## 2020-03-13 DIAGNOSIS — F17210 Nicotine dependence, cigarettes, uncomplicated: Secondary | ICD-10-CM

## 2020-03-13 DIAGNOSIS — R059 Cough, unspecified: Secondary | ICD-10-CM

## 2020-03-13 MED ORDER — TRELEGY ELLIPTA 100-62.5-25 MCG/INH IN AEPB: 1.0000 | INHALATION_SPRAY | Freq: Every day | RESPIRATORY_TRACT | 0 refills | Status: AC

## 2020-03-13 NOTE — Progress Notes (Signed)
 Assessment & Plan:  1. Chronic bronchitis, unspecified chronic bronchitis type (HCC) ([place]) Comments: COPD with mucopurulent bronchitis Severe as per spirometry performed in 2019 Repeat PFTs Trial of Trelegy Ellipta  - Chlamydophila psittaci Antibodies (IgG, IgA, IgM); Future - Pulmonary Function Test [facility] Only; Future - Fluticasone -Umeclidin-Vilant (TRELEGY ELLIPTA ) 100-62.5-25 MCG/INH AEPB; Inhale 1 puff into the lungs daily for 1 day.  Dispense: 14 each; Refill: 0  2. Cough Comments: Has had increase issues with worse control of COPD Mucopurulent sputum Has exposure to parakeets Check for psittacosis  3. Seasonal allergic rhinitis, unspecified trigger Comments: Continue taking Zyrtec as needed  4. Tobacco dependence due to cigarettes Comments: Counseled regards to discontinuation of smoking Total counseling time 3 to 5 minutes Enroll in lung cancer screening program   Patient Instructions  What we discussed today: We have switched Breo Ellipta  to Trelegy Ellipta .  Do not use Breo while using the Trelegy Rinse your mouth well after use of Trelegy, you may put a little baking soda in the water  to help clear the residual powder Let us  know how you are doing with the Trelegy if you are doing better we will call that prescription in We are going to refer you to the lung cancer screening program We are getting a blood test to make sure you do not have issues from your parakeets We will get a breathing test We will see him in follow-up in 2 months time call sooner should any new problems arise Quit smoking!  Please note: late entry documentation due to logistical difficulties during COVID-19 pandemic. This note is filed for information purposes only, and is not intended to be used for billing, nor does it represent the full scope/nature of the visit in question. Please see any associated scanned media linked to date of encounter for additional pertinent  information.  Subjective:    HPI: Alexandria [name] is a 64 y.o. female presenting to the pulmonology clinic on 03/13/2020 with report of: pulmonary consult (per Delon Dickinson for bornchitis- pt c/o sob with exertion and occ dry cough at times prod with clear mucus. )     Outpatient Encounter Medications as of 03/13/2020  Medication Sig Note   cetirizine (ZYRTEC) 10 MG tablet Take 1 tablet (10 mg total) by mouth daily. 08/29/2022: Reports taking as needed   [DISCONTINUED] albuterol  (PROVENTIL  HFA) 108 (90 Base) MCG/ACT inhaler INHALE 2 PUFFS INTO LUNGS EVERY 4 HOURS AS NEEDED FOR WHEEZING OR SHORTNESS OF BREATH    [DISCONTINUED] amLODipine  (NORVASC ) 10 MG tablet TAKE 1 TABLET BY MOUTH ONCE DAILY *NEED  APPOINTMENT  BEFORE  FUTURE  REFILLS*    [DISCONTINUED] atorvastatin  (LIPITOR) 20 MG tablet Take 1 tablet by mouth once daily    [DISCONTINUED] benzonatate  (TESSALON  PERLES) 100 MG capsule Take 1 capsule (100 mg total) by mouth 3 (three) times daily as needed for cough.    [DISCONTINUED] clonazePAM  (KLONOPIN ) 0.5 MG tablet Take 1 tablet by mouth twice daily as needed    [DISCONTINUED] doxycycline  (VIBRAMYCIN ) 100 MG capsule Take 1 capsule (100 mg total) by mouth 2 (two) times daily.    [DISCONTINUED] fluticasone  furoate-vilanterol (BREO ELLIPTA ) 200-25 MCG/INH AEPB Inhale 1 puff into the lungs daily.    [DISCONTINUED] Fluticasone -Salmeterol (ADVAIR DISKUS) 250-50 MCG/DOSE AEPB Inhale 1 puff into the lungs 2 (two) times daily.    [DISCONTINUED] hydrochlorothiazide  (HYDRODIURIL ) 25 MG tablet Take 1 tablet (25 mg total) by mouth daily.    [DISCONTINUED] MULTIPLE VITAMIN PO Take 1 tablet by mouth daily.  Reported on 03/13/2016    [EXPIRED] Fluticasone -Umeclidin-Vilant (TRELEGY ELLIPTA ) 100-62.5-25 MCG/INH AEPB Inhale 1 puff into the lungs daily for 1 day.    [DISCONTINUED] fluticasone  (FLONASE ) 50 MCG/ACT nasal spray Place 2 sprays into both nostrils daily.    No facility-administered encounter  medications on file as of 03/13/2020.      Objective:   Vitals:   03/13/20 1410  BP: 126/80  Pulse: 90  Temp: 97.8 F (36.6 C)  Height: 5' 7 (1.702 m)  Weight: 186 lb (84.4 kg)  SpO2: 95%  TempSrc: Temporal  BMI (Calculated): 29.12     Physical exam documentation is limited by delayed entry of information.

## 2020-03-13 NOTE — Patient Instructions (Signed)
What we discussed today:  We have switched Breo Ellipta to Trelegy Ellipta.  Do not use Breo while using the Trelegy  Rinse your mouth well after use of Trelegy, you may put a little baking soda in the water to help clear the residual powder  Let us know how you are doing with the Trelegy if you are doing better we will call that prescription in  We are going to refer you to the lung cancer screening program  We are getting a blood test to make sure you do not have issues from your parakeets  We will get a breathing test  We will see him in follow-up in 2 months time call sooner should any new problems arise  Quit smoking!

## 2020-03-14 LAB — MISC LABCORP TEST (SEND OUT): Labcorp test code: 138338

## 2020-03-15 ENCOUNTER — Other Ambulatory Visit: Payer: Self-pay

## 2020-03-15 MED ORDER — DOXYCYCLINE HYCLATE 100 MG PO TABS
100.0000 mg | ORAL_TABLET | Freq: Two times a day (BID) | ORAL | 0 refills | Status: DC
Start: 2020-03-15 — End: 2020-04-03

## 2020-03-24 ENCOUNTER — Telehealth: Payer: Self-pay | Admitting: Pulmonary Disease

## 2020-03-24 MED ORDER — TRELEGY ELLIPTA 100-62.5-25 MCG/INH IN AEPB: 1.0000 | INHALATION_SPRAY | Freq: Every day | RESPIRATORY_TRACT | 5 refills | Status: AC

## 2020-03-24 NOTE — Telephone Encounter (Signed)
Lm to relay date/time of covid test. [facility]/28/2020 [facility]

## 2020-03-24 NOTE — Telephone Encounter (Signed)
Lm for pt

## 2020-03-24 NOTE — Telephone Encounter (Signed)
Noted.  Will close encounter.  

## 2020-03-24 NOTE — Telephone Encounter (Signed)
Rx for Trelegy 100 has been sent to preferred pharmacy per pt request, as she feels that this medication is effective.  Nothing further is needed.

## 2020-03-27 ENCOUNTER — Encounter: Payer: Self-pay | Admitting: Urgent Care

## 2020-03-27 ENCOUNTER — Other Ambulatory Visit
Admission: RE | Admit: 2020-03-27 | Discharge: 2020-03-27 | Disposition: A | Discharge status: Home / Self Care | Payer: Medicare Other | Source: Ambulatory Visit | Attending: Pulmonary Disease | Admitting: Pulmonary Disease

## 2020-03-27 ENCOUNTER — Other Ambulatory Visit: Payer: Self-pay

## 2020-03-27 DIAGNOSIS — Z01812 Encounter for preprocedural laboratory examination: Secondary | ICD-10-CM | POA: Insufficient documentation

## 2020-03-27 DIAGNOSIS — Z20822 Contact with and (suspected) exposure to covid-19: Secondary | ICD-10-CM | POA: Diagnosis not present

## 2020-03-28 ENCOUNTER — Other Ambulatory Visit: Payer: Self-pay

## 2020-03-28 ENCOUNTER — Ambulatory Visit: Payer: Medicare Other | Attending: Pulmonary Disease

## 2020-03-28 DIAGNOSIS — J449 Chronic obstructive pulmonary disease, unspecified: Secondary | ICD-10-CM | POA: Insufficient documentation

## 2020-03-28 DIAGNOSIS — J42 Unspecified chronic bronchitis: Secondary | ICD-10-CM | POA: Diagnosis not present

## 2020-03-28 LAB — PULMONARY FUNCTION TEST ARMC ONLY
DL/VA % pred: 24 %
DL/VA: 2.99 ml/min/mmHg/L
DLCO unc % pred: 9260 %
DLCO unc: 15.05 ml/min/mmHg
FEF 25-75 Post: 1.08 L/sec
FEF 25-75 Pre: 0.81 L/sec
FEF2575-%Change-Post: 33 %
FEF2575-%Pred-Post: 353 %
FEF2575-%Pred-Pre: 264 %
FEV1-%Change-Post: 12 %
FEV1-%Pred-Post: -319 %
FEV1-%Pred-Pre: -283 %
FEV1-Post: 1.87 L
FEV1-Pre: 1.66 L
FEV1FVC-%Change-Post: 5 %
FEV1FVC-%Pred-Pre: 73 %
FEV6-%Change-Post: 9 %
FEV6-%Pred-Post: -419 %
FEV6-%Pred-Pre: -383 %
FEV6-Post: 3.03 L
FEV6-Pre: 2.78 L
FEV6FVC-%Change-Post: 2 %
FEV6FVC-%Pred-Post: 95 %
FEV6FVC-%Pred-Pre: 93 %
FVC-%Change-Post: 7 %
FVC-%Pred-Post: -440 %
FVC-%Pred-Pre: -411 %
FVC-Post: 3.11 L
Post FEV1/FVC ratio: 60 %
Post FEV6/FVC ratio: 97 %
Pre FEV1/FVC ratio: 57 %
Pre FEV6/FVC Ratio: 95 %
RV % pred: -334 %
RV: 2.56 L
TLC % pred: -155 %
TLC: 5.44 L

## 2020-03-28 LAB — SARS CORONAVIRUS 2 (TAT 6-24 HRS): SARS Coronavirus 2: NEGATIVE

## 2020-03-28 MED ORDER — ALBUTEROL SULFATE (2.5 MG/3ML) 0.083% IN NEBU
2.5000 mg | INHALATION_SOLUTION | Freq: Once | RESPIRATORY_TRACT | Status: AC
  Administered 2020-03-28: 2.5 mg via RESPIRATORY_TRACT
  Filled 2020-03-28: qty 3

## 2020-04-03 ENCOUNTER — Other Ambulatory Visit (HOSPITAL_COMMUNITY)
Admission: RE | Admit: 2020-04-03 | Discharge: 2020-04-03 | Disposition: A | Discharge status: Home / Self Care | Payer: Medicare Other | Source: Ambulatory Visit | Attending: Physician Assistant | Admitting: Physician Assistant

## 2020-04-03 ENCOUNTER — Other Ambulatory Visit: Payer: Self-pay

## 2020-04-03 ENCOUNTER — Ambulatory Visit (INDEPENDENT_AMBULATORY_CARE_PROVIDER_SITE_OTHER): Payer: Medicare Other | Admitting: Physician Assistant

## 2020-04-03 ENCOUNTER — Encounter: Payer: Self-pay | Admitting: Physician Assistant

## 2020-04-03 VITALS — BP 142/81 | HR 80 | Temp 98.2°F | Resp 16 | Ht 67.0 in | Wt 188.0 lb

## 2020-04-03 DIAGNOSIS — Z1239 Encounter for other screening for malignant neoplasm of breast: Secondary | ICD-10-CM | POA: Diagnosis not present

## 2020-04-03 DIAGNOSIS — Z23 Encounter for immunization: Secondary | ICD-10-CM

## 2020-04-03 DIAGNOSIS — Z1211 Encounter for screening for malignant neoplasm of colon: Secondary | ICD-10-CM

## 2020-04-03 DIAGNOSIS — Z124 Encounter for screening for malignant neoplasm of cervix: Secondary | ICD-10-CM | POA: Diagnosis not present

## 2020-04-03 DIAGNOSIS — Z1151 Encounter for screening for human papillomavirus (HPV): Secondary | ICD-10-CM | POA: Insufficient documentation

## 2020-04-03 DIAGNOSIS — Z6829 Body mass index (BMI) 29.0-29.9, adult: Secondary | ICD-10-CM

## 2020-04-03 DIAGNOSIS — J418 Mixed simple and mucopurulent chronic bronchitis: Secondary | ICD-10-CM

## 2020-04-03 DIAGNOSIS — Z13820 Encounter for screening for osteoporosis: Secondary | ICD-10-CM

## 2020-04-03 DIAGNOSIS — Z0000 Encounter for general adult medical examination without abnormal findings: Secondary | ICD-10-CM | POA: Diagnosis not present

## 2020-04-03 DIAGNOSIS — E782 Mixed hyperlipidemia: Secondary | ICD-10-CM

## 2020-04-03 NOTE — Patient Instructions (Signed)

## 2020-04-03 NOTE — Progress Notes (Signed)
Medicare Initial Preventative Physical Exam    Patient: Alexandria [name], Female    DOB: [birth year], 64 y.o.   MRN: [id number] Visit Date: 04/03/2020  Today's Provider: Mar Daring, PA-C   Chief Complaint  Patient presents with  . Medicare Wellness   Subjective    Medicare Initial Preventative Physical Exam Alexandria [name] is a 64 y.o. female who presents today for her Initial Preventative Physical Exam.   HPI Last Mammogram:05/25/19 Last pap:07/19/218-Normal,HPV-Negative Last Colonoscopy: 03/16/2015  Social History   Socioeconomic History  . Marital status: Married    Spouse name: Remo Lipps  . Number of children: 3  . Years of education: 69  . Highest education level: Not on file  Occupational History  . 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 Smoking status: Current Every Day Smoker    Packs/day: 0.50    Years: 30.00    Pack years: 15.00    Types: Cigarettes    Last attempt to quit: 01/31/2017    Years since quitting: 3.1  . Smokeless tobacco: Never Used  Vaping Use  . Vaping Use: Never used  Substance and Sexual Activity  . Alcohol use: No    Alcohol/week: 0.0 standard drinks  . Drug use: No  . Sexual activity: Not on file  Other Topics Concern  . Not on file  Social History Narrative  . Not on file   Social Determinants of Health   Financial Resource Strain:   . Difficulty of Paying Living Expenses:   Food Insecurity:   . Worried About Charity fundraiser in the Last Year:   . Arboriculturist in the Last Year:   Transportation Needs:   . Film/video editor (Medical):   Marland Kitchen Lack of Transportation (Non-Medical):   Physical Activity:   . Days of Exercise per Week:   . Minutes of Exercise per Session:   Stress:   . Feeling of Stress :   Social Connections:   . Frequency of Communication with Friends and Family:   . Frequency of Social Gatherings with Friends and Family:   . Attends Religious Services:   . Active  Member of Clubs or Organizations:   . Attends Archivist Meetings:   Marland Kitchen Marital Status:   Intimate Partner Violence:   . Fear of Current or Ex-Partner:   . Emotionally Abused:   Marland Kitchen Physically Abused:   . Sexually Abused:     Past Medical History:  Diagnosis Date  . Anxiety   . Carbuncle and furuncle of trunk   . HTN (hypertension)   . Hyperlipidemia   . Intrinsic asthma, unspecified   . Methicillin resistant Staphylococcus aureus in conditions classified elsewhere and of unspecified site   . Other psychological or physical stress, not elsewhere classified(V62.89)   . Tobacco abuse      Patient Active Problem List   Diagnosis Date Noted  . Anxiety   . Chronic obstructive pulmonary disease ([place]) 05/19/2018  . Diverticulosis 03/17/2017  . Allergic rhinitis 01/29/2017  . Hx of colonic polyps   . History of anxiety 03/10/2015  . HTN (hypertension)   . Hyperlipidemia     Past Surgical History:  Procedure Laterality Date  . COLONOSCOPY WITH PROPOFOL N/A 04/10/2015   Procedure: COLONOSCOPY WITH PROPOFOL;  Surgeon: Lucilla Lame, MD;  Location: [facility];  Service: Endoscopy;  Laterality: N/A;  . POLYPECTOMY  04/10/2015   Procedure: POLYPECTOMY;  Surgeon: Evangeline Gula  Allen Norris, MD;  Location: [facility];  Service: Endoscopy;;  . STATUS POST ENDOMETRIAL ABLATION    . TONSILLECTOMY    . TUBAL LIGATION     bilateral, sterile ablation  . VARICOSE VEINS      Her family history includes Coronary artery disease in her mother; Diabetes in her sister; Emphysema in her father; Gout in her father; Heart attack in her mother; Hypertension in her daughter, daughter, and mother; Lung cancer in her father; Obesity in her daughter; Rheum arthritis in her mother; Seizures in her daughter. There is no history of Breast cancer, Bladder Cancer, or Kidney cancer.   Current Outpatient Medications:  .  albuterol (PROVENTIL HFA) 108 (90 Base) MCG/ACT inhaler, INHALE 2 PUFFS INTO LUNGS  EVERY 4 HOURS AS NEEDED FOR WHEEZING OR SHORTNESS OF BREATH, Disp: 18 g, Rfl: 3 .  amLODipine (NORVASC) 10 MG tablet, TAKE 1 TABLET BY MOUTH ONCE DAILY *NEED  APPOINTMENT  BEFORE  FUTURE  REFILLS*, Disp: 90 tablet, Rfl: 0 .  atorvastatin (LIPITOR) 20 MG tablet, Take 1 tablet by mouth once daily, Disp: 90 tablet, Rfl: 1 .  cetirizine (ZYRTEC) 10 MG tablet, Take 1 tablet (10 mg total) by mouth daily., Disp: 30 tablet, Rfl: 11 .  clonazePAM (KLONOPIN) 0.5 MG tablet, Take 1 tablet by mouth twice daily as needed, Disp: 60 tablet, Rfl: 5 .  hydrochlorothiazide (HYDRODIURIL) 25 MG tablet, Take 1 tablet (25 mg total) by mouth daily., Disp: 90 tablet, Rfl: 3 .  MULTIPLE VITAMIN PO, Take 1 tablet by mouth daily. Reported on 03/13/2016, Disp: , Rfl:    Patient Care Team: Mar Daring, PA-C as PCP - General (Family Medicine)  Review of Systems  Constitutional: Negative.   HENT: Positive for tinnitus.   Eyes: Negative.   Respiratory: Positive for shortness of breath.   Cardiovascular: Negative.   Gastrointestinal: Negative.   Endocrine: Negative.   Genitourinary: Negative.   Musculoskeletal: Negative.   Allergic/Immunologic: Negative.   Neurological: Negative.   Hematological: Negative.   Psychiatric/Behavioral: Negative.     Last CBC Lab Results  Component Value Date   WBC 9.0 04/03/2020   HGB 15.4 04/03/2020   HCT 44.8 04/03/2020   MCV 86 04/03/2020   MCH 29.6 04/03/2020   RDW 13.3 04/03/2020   PLT 316 97/67/3419   Last metabolic panel Lab Results  Component Value Date   GLUCOSE 87 04/03/2020   NA 140 04/03/2020   K 3.6 04/03/2020   CL 98 04/03/2020   CO2 26 04/03/2020   BUN 8 04/03/2020   CREATININE 0.75 04/03/2020   GFRNONAA 84 04/03/2020   GFRAA 97 04/03/2020   CALCIUM 10.1 04/03/2020   PROT 6.7 04/03/2020   ALBUMIN 4.5 04/03/2020   LABGLOB 2.2 04/03/2020   AGRATIO 2.0 04/03/2020   BILITOT 0.5 04/03/2020   ALKPHOS 132 (H) 04/03/2020   AST 15 04/03/2020   ALT  19 04/03/2020   ANIONGAP 10 06/17/2015      Objective    Vitals: BP (!) 142/81 (BP Location: Right Arm, Patient Position: Sitting, Cuff Size: Normal)   Pulse 80   Temp 98.2 F (36.8 C) (Oral)   Resp 16   Ht 5\' 7"  (1.702 m)   Wt 188 lb (85.3 kg)   BMI 29.44 kg/m   Hearing Screening   125Hz  250Hz  500Hz  1000Hz  2000Hz  3000Hz  4000Hz  6000Hz  8000Hz   Right ear:   Fail 40 40  40    Left ear:   Fail 40 25  40  Comments: "some ringing in her right ear" hx of ear puncture with a qtip when younger" 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  Constitutional:      General: She is not in acute distress.    Appearance: She is well-developed. She is not diaphoretic.  HENT:     Head: Normocephalic and atraumatic.     Right Ear: Hearing, tympanic membrane, ear canal and external ear normal.     Left Ear: Hearing, tympanic membrane, ear canal and external ear normal.     Nose: Nose normal.     Mouth/Throat:     Pharynx: Uvula midline. No oropharyngeal exudate.  Eyes:     General: No scleral icterus.       Right eye: No discharge.        Left eye: No discharge.     Conjunctiva/sclera: Conjunctivae normal.     Pupils: Pupils are equal, round, and reactive to light.  Neck:     Thyroid: No thyromegaly.     Vascular: No carotid bruit or JVD.     Trachea: No tracheal deviation.  Cardiovascular:     Rate and Rhythm: Normal rate and regular rhythm.     Heart sounds: Normal heart sounds. No murmur heard.  No friction rub. No gallop.   Pulmonary:     Effort: Pulmonary effort is normal. No respiratory distress.     Breath sounds: Normal breath sounds. No wheezing or rales.  Chest:     Chest wall: No tenderness.     Breasts: Breasts are symmetrical.        Right: No inverted nipple, mass, nipple discharge, skin change or tenderness.        Left: No  inverted nipple, mass, nipple discharge, skin change or tenderness.  Abdominal:     General: Bowel sounds are normal. There is no distension.     Palpations: Abdomen is soft. There is no mass.     Tenderness: There is no abdominal tenderness. There is no guarding or rebound.     Hernia: There is no hernia in the left inguinal area.  Genitourinary:    Exam position: Supine.     Labia:        Right: No rash, tenderness, lesion or injury.        Left: No rash, tenderness, lesion or injury.      Vagina: Normal. No signs of injury. No vaginal discharge, erythema, tenderness or bleeding.     Cervix: No cervical motion tenderness, discharge or friability.     Adnexa:        Right: No mass, tenderness or fullness.         Left: No mass, tenderness or fullness.       Rectum: Normal.  Musculoskeletal:        General: No tenderness. Normal range of motion.     Cervical back: Normal range of motion and neck supple.  Lymphadenopathy:     Cervical: No cervical adenopathy.  Skin:    General: Skin is warm and dry.     Findings: No rash.  Neurological:     Mental Status: She is alert and oriented to person, place, and time.     Cranial Nerves: No cranial nerve deficit.     Coordination: Coordination normal.     Deep Tendon Reflexes:  Reflexes are normal and symmetric.  Psychiatric:        Behavior: Behavior normal.        Thought Content: Thought content normal.        Judgment: Judgment normal.      Activities of Daily Living In your present state of health, do you have any difficulty performing the following activities: 04/03/2020  Hearing? N  Vision? N  Difficulty concentrating or making decisions? N  Walking or climbing stairs? N  Dressing or bathing? N  Doing errands, shopping? N  Some recent data might be hidden    Fall Risk Assessment Fall Risk  04/03/2020 03/24/2018 03/24/2018 03/17/2017 03/13/2016  Falls in the past year? 0 No No No No  Number falls in past yr: 0 - - - -  Injury  with Fall? 0 - - - -  Risk for fall due to : No Fall Risks - - - -  Follow up Falls evaluation completed - - - -     Depression Screen PHQ 2/9 Scores 04/03/2020 04/02/2019 03/24/2018 03/24/2018  PHQ - 2 Score 0 2 0 0  PHQ- 9 Score - 4 - -    6CIT Screen 04/03/2020  What Year? 0 points  What month? 0 points  What time? 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  Assessment & Plan      Initial Preventative Physical Exam  Reviewed patient's Family Medical History Reviewed and updated list of patient's medical providers Assessment of cognitive impairment was done Assessed patient's functional ability Established a written schedule for health screening [place] Completed and Reviewed  Exercise Activities and Dietary recommendations Goals   None     Immunization History  Administered Date(s) Administered  . Influenza,inj,Quad PF,6+ Mos 05/19/2018  . Influenza-Unspecified 06/14/2019  . Pneumococcal Polysaccharide-23 03/10/2015, 04/03/2020  . Tdap 03/17/2017  . Zoster 03/10/2015    Health Maintenance  Topic Date Due  . COVID-19 Vaccine (1) Never done  . DEXA SCAN  Never done  . INFLUENZA VACCINE  05/04/2020 (Originally 04/02/2020)  . COLONOSCOPY  04/09/2020  . PNA vac Low Risk Adult (2 of 2 - PCV13) 04/03/2021  . MAMMOGRAM  05/24/2021  . PAP SMEAR-Modifier  03/17/2022  . TETANUS/TDAP  03/18/2027  . Hepatitis C Screening  Completed  . HIV Screening  Completed       Discussed health benefits of physical activity, and encouraged her to engage in regular exercise appropriate for her age and condition.   1. Medicare annual wellness visit, initial EKG today shows NSR rate of 73 with no ST changes. Patient updated on screenings and vaccinations.   - EKG 12-Lead  2. Need for pneumococcal vaccination Pneumococcal 23 Vaccine given to patient without complications. Patient sat for 15 minutes after administration and was tolerated well without adverse effects. -  Pneumococcal polysaccharide vaccine 23-valent greater than or equal to 2yo subcutaneous/IM  3. Encounter for breast cancer screening using non-mammogram modality Breast exam today was normal. There is no family history of breast cancer. She does perform regular self breast exams. Mammogram was ordered as below. Information for [facility] Breast clinic was given to patient so she may schedule her mammogram at her convenience. - MM 3D SCREEN BREAST BILATERAL; Future  4. Cervical cancer screening Pap collected today. Will send as below and f/u pending results. - Cytology - PAP  5. Colon cancer screening Referral for colonoscopy placed.  - Ambulatory referral to Gastroenterology  6. Mixed simple and mucopurulent chronic bronchitis (HCC) Stable. Continue current inhalers. Will check labs as below and f/u pending results. - CBC w/Diff/Platelet - Comprehensive Metabolic Panel (CMET)  7. Mixed hyperlipidemia Stable. Continue Atorvastatin 20mg . Will check labs as below and f/u pending results. - CBC w/Diff/Platelet - Comprehensive Metabolic Panel (CMET) - Lipid Panel With LDL/HDL Ratio - HgB A1c  8. BMI 29.0-29.9,adult Counseled patient on healthy lifestyle modifications including dieting and exercise.  - CBC w/Diff/Platelet - Comprehensive Metabolic Panel (CMET) - Lipid Panel With LDL/HDL Ratio - HgB A1c  9. Osteoporosis screening Bone density ordered.    Return in about 1 year (around  04/03/2021) for AWV.     Reynolds Bowl, PA-C, have reviewed all documentation for this visit. The documentation on 04/23/20 for the exam, diagnosis, procedures, and orders are all accurate and complete.   [place]  [facility] [phone] (phone) [phone] (fax)  [place]

## 2020-04-04 ENCOUNTER — Telehealth: Payer: Self-pay

## 2020-04-04 LAB — CBC WITH DIFFERENTIAL/PLATELET
Basophils Absolute: 0.1 10*3/uL (ref 0.0–0.2)
Basos: 1 %
EOS (ABSOLUTE): 0.2 10*3/uL (ref 0.0–0.4)
Eos: 2 %
Hematocrit: 44.8 % (ref 34.0–46.6)
Hemoglobin: 15.4 g/dL (ref 11.1–15.9)
Immature Grans (Abs): 0 10*3/uL (ref 0.0–0.1)
Immature Granulocytes: 0 %
Lymphocytes Absolute: 2.7 10*3/uL (ref 0.7–3.1)
Lymphs: 30 %
MCH: 29.6 pg (ref 26.6–33.0)
MCHC: 34.4 g/dL (ref 31.5–35.7)
MCV: 86 fL (ref 79–97)
Monocytes Absolute: 0.5 10*3/uL (ref 0.1–0.9)
Monocytes: 6 %
Neutrophils Absolute: 5.5 10*3/uL (ref 1.4–7.0)
Neutrophils: 61 %
Platelets: 316 10*3/uL (ref 150–450)
RBC: 5.2 x10E6/uL (ref 3.77–5.28)
RDW: 13.3 % (ref 11.7–15.4)
WBC: 9 10*3/uL (ref 3.4–10.8)

## 2020-04-04 LAB — HEMOGLOBIN A1C
Est. average glucose Bld gHb Est-mCnc: 140 mg/dL
Hgb A1c MFr Bld: 6.5 % — ABNORMAL HIGH (ref 4.8–5.6)

## 2020-04-04 LAB — COMPREHENSIVE METABOLIC PANEL
ALT: 19 IU/L (ref 0–32)
AST: 15 IU/L (ref 0–40)
Albumin/Globulin Ratio: 2 (ref 1.2–2.2)
Albumin: 4.5 g/dL (ref 3.8–4.8)
Alkaline Phosphatase: 132 IU/L — ABNORMAL HIGH (ref 48–121)
BUN/Creatinine Ratio: 11 — ABNORMAL LOW (ref 12–28)
BUN: 8 mg/dL (ref 8–27)
Bilirubin Total: 0.5 mg/dL (ref 0.0–1.2)
CO2: 26 mmol/L (ref 20–29)
Calcium: 10.1 mg/dL (ref 8.7–10.3)
Chloride: 98 mmol/L (ref 96–106)
Creatinine, Ser: 0.75 mg/dL (ref 0.57–1.00)
GFR calc Af Amer: 97 mL/min/{1.73_m2} (ref >59)
GFR calc non Af Amer: 84 mL/min/{1.73_m2} (ref >59)
Globulin, Total: 2.2 g/dL (ref 1.5–4.5)
Glucose: 87 mg/dL (ref 65–99)
Potassium: 3.6 mmol/L (ref 3.5–5.2)
Sodium: 140 mmol/L (ref 134–144)
Total Protein: 6.7 g/dL (ref 6.0–8.5)

## 2020-04-04 LAB — LIPID PANEL WITH LDL/HDL RATIO
Cholesterol, Total: 177 mg/dL (ref 100–199)
HDL: 46 mg/dL (ref >39)
LDL Chol Calc (NIH): 108 mg/dL — ABNORMAL HIGH (ref 0–99)
LDL/HDL Ratio: 2.3 ratio (ref 0.0–3.2)
Triglycerides: 127 mg/dL (ref 0–149)
VLDL Cholesterol Cal: 23 mg/dL (ref 5–40)

## 2020-04-04 LAB — CYTOLOGY - PAP
Comment: NEGATIVE
Diagnosis: NEGATIVE
High risk HPV: NEGATIVE

## 2020-04-04 NOTE — Telephone Encounter (Signed)
Written by Mar Daring, PA-C on 04/04/2020 4:05 PM EDT View Full Comments Seen by patient Quenten Raven [place]aut on 04/04/2020 4:12 PM

## 2020-04-04 NOTE — Telephone Encounter (Signed)
-----   Message from Mar Daring, [place] sent at 04/04/2020  4:05 PM EDT ----- Blood count is normal. Kidney function is normal. Liver enzymes are ok. Alkaline phosphatase is just borderline elevated. Will continue to monitor annually. Sodium, potassium, and calcium are normal. Cholesterol is normal. A1c increased from 6.3 to now 6.5. 6.5 is a diabetic level. However, since this is the first one we normally advise to work on lifestyle modifications with limiting fatty foods, processed meats, carbohydrates and sugars. We will recheck this level in 6 months. If still at 6.5 or greater you are then classified as diabetic.

## 2020-04-05 ENCOUNTER — Telehealth: Payer: Self-pay

## 2020-04-05 NOTE — Telephone Encounter (Signed)
-----   Message from Mar Daring, [place] sent at 04/04/2020  5:34 PM EDT ----- Pap is normal, HPV negative.  No need to repeat due to age unless issues arise.

## 2020-04-05 NOTE — Telephone Encounter (Signed)
Patient called ans was read note by Fenton Malling PA dated 04/04/20.  She verbalized understanding of all information.

## 2020-04-05 NOTE — Telephone Encounter (Signed)
LMTCB 04/05/2020.  PEC please advise pt below.   Thanks,   -Mickel Baas

## 2020-05-12 ENCOUNTER — Other Ambulatory Visit: Payer: Self-pay | Admitting: Physician Assistant

## 2020-05-12 DIAGNOSIS — J449 Chronic obstructive pulmonary disease, unspecified: Secondary | ICD-10-CM

## 2020-05-17 ENCOUNTER — Ambulatory Visit: Payer: Medicare Other | Admitting: Pulmonary Disease

## 2020-05-27 ENCOUNTER — Other Ambulatory Visit: Payer: Self-pay | Admitting: Physician Assistant

## 2020-05-27 DIAGNOSIS — I10 Essential (primary) hypertension: Secondary | ICD-10-CM

## 2020-05-27 NOTE — Telephone Encounter (Signed)
Requested Prescriptions  Pending Prescriptions Disp Refills  . hydrochlorothiazide (HYDRODIURIL) 25 MG tablet [Pharmacy Med Name: hydroCHLOROthiazide 25 MG Oral Tablet] 90 tablet 0    Sig: Take 1 tablet by mouth once daily     Cardiovascular: Diuretics - Thiazide Failed - 05/27/2020  5:49 PM      Failed - Last BP in normal range    BP Readings from Last 1 Encounters:  04/03/20 (!) 142/81         Passed - Ca in normal range and within 360 days    Calcium  Date Value Ref Range Status  04/03/2020 10.1 8.7 - 10.3 mg/dL Final   Calcium, Total  Date Value Ref Range Status  12/25/2014 9.2 mg/dL Final    Comment:    8.9-10.3 NOTE: New Reference Range  11/08/14          Passed - Cr in normal range and within 360 days    Creatinine  Date Value Ref Range Status  12/26/2014 0.53 mg/dL Final    Comment:    0.44-1.00 NOTE: New Reference Range  11/08/14    Creatinine, Ser  Date Value Ref Range Status  04/03/2020 0.75 0.57 - 1.00 mg/dL Final         Passed - K in normal range and within 360 days    Potassium  Date Value Ref Range Status  04/03/2020 3.6 3.5 - 5.2 mmol/L Final  12/25/2014 3.6 mmol/L Final    Comment:    3.5-5.1 NOTE: New Reference Range  11/08/14          Passed - Na in normal range and within 360 days    Sodium  Date Value Ref Range Status  04/03/2020 140 134 - 144 mmol/L Final  12/25/2014 133 (L) mmol/L Final    Comment:    135-145 NOTE: New Reference Range  11/08/14          Passed - Valid encounter within last 6 months    Recent Outpatient Visits          1 month ago Medicare annual wellness visit, initial   [place], [place]   4 months ago Mixed simple and mucopurulent chronic bronchitis [facility])   [place], [place], [place]   5 months ago Seasonal allergies   [facility] [place], [place], [place]   1 year ago Annual physical exam   [facility]  [place], [place], [place]   2 years ago [place], [place]

## 2020-05-28 ENCOUNTER — Other Ambulatory Visit: Payer: Self-pay | Admitting: Physician Assistant

## 2020-05-28 DIAGNOSIS — J449 Chronic obstructive pulmonary disease, unspecified: Secondary | ICD-10-CM

## 2020-06-11 ENCOUNTER — Other Ambulatory Visit: Payer: Self-pay | Admitting: Physician Assistant

## 2020-06-11 DIAGNOSIS — E782 Mixed hyperlipidemia: Secondary | ICD-10-CM

## 2020-06-21 ENCOUNTER — Other Ambulatory Visit: Payer: Self-pay | Admitting: Physician Assistant

## 2020-06-21 DIAGNOSIS — I10 Essential (primary) hypertension: Secondary | ICD-10-CM

## 2020-07-14 ENCOUNTER — Other Ambulatory Visit: Payer: Self-pay | Admitting: Physician Assistant

## 2020-07-14 DIAGNOSIS — J449 Chronic obstructive pulmonary disease, unspecified: Secondary | ICD-10-CM

## 2020-07-30 ENCOUNTER — Other Ambulatory Visit: Payer: Self-pay | Admitting: Physician Assistant

## 2020-07-30 DIAGNOSIS — F419 Anxiety disorder, unspecified: Secondary | ICD-10-CM

## 2020-07-30 NOTE — Telephone Encounter (Signed)
Requested medication (s) are due for refill today: yes  Requested medication (s) are on the active medication list: yes  Last refill:  01/10/20  Future visit scheduled: no  Notes to clinic:  med not delegated to NT to RF   Requested Prescriptions  Pending Prescriptions Disp Refills   clonazePAM (KLONOPIN) 0.5 MG tablet [Pharmacy Med Name: clonazePAM 0.5 MG Oral Tablet] 60 tablet 0    Sig: Take 1 tablet by mouth twice daily as needed      Not Delegated - Psychiatry:  Anxiolytics/Hypnotics Failed - 07/30/2020 12:30 PM      Failed - This refill cannot be delegated      Failed - Urine Drug Screen completed in last 360 days      Passed - Valid encounter within last 6 months    Recent Outpatient Visits           3 months ago Medicare annual wellness visit, initial   [place], [place]   6 months ago Mixed simple and mucopurulent chronic bronchitis [facility])   [place], [place], [place]   7 months ago Seasonal allergies   [facility] [place], [place] Sorrel, [place]   1 year ago Annual physical exam   [facility] [place], [place]ce Sorrel, [place]   2 years ago [place], [place], [place]

## 2020-08-05 ENCOUNTER — Ambulatory Visit
Admission: EM | Admit: 2020-08-05 | Discharge: 2020-08-05 | Disposition: A | Discharge status: Home / Self Care | Payer: Medicare Other | Attending: Family Medicine | Admitting: Family Medicine

## 2020-08-05 ENCOUNTER — Other Ambulatory Visit: Payer: Self-pay

## 2020-08-05 DIAGNOSIS — R1030 Lower abdominal pain, unspecified: Secondary | ICD-10-CM | POA: Diagnosis not present

## 2020-08-05 DIAGNOSIS — Z8719 Personal history of other diseases of the digestive system: Secondary | ICD-10-CM | POA: Diagnosis not present

## 2020-08-05 LAB — URINALYSIS, COMPLETE (UACMP) WITH MICROSCOPIC
Bacteria, UA: NONE SEEN
Bilirubin Urine: NEGATIVE
Glucose, UA: 100 mg/dL — AB
Hgb urine dipstick: NEGATIVE
Ketones, ur: NEGATIVE mg/dL
Leukocytes,Ua: NEGATIVE
Nitrite: NEGATIVE
Protein, ur: NEGATIVE mg/dL
Specific Gravity, Urine: 1.02 (ref 1.005–1.030)
pH: 6.5 (ref 5.0–8.0)

## 2020-08-05 MED ORDER — AMOXICILLIN-POT CLAVULANATE 875-125 MG PO TABS: 1.0000 | ORAL_TABLET | Freq: Two times a day (BID) | ORAL | 0 refills | Status: DC

## 2020-08-05 NOTE — ED Provider Notes (Signed)
[facility]    CSN: [id number] Arrival date & time: 08/05/20  1012      History   Chief Complaint Chief Complaint  Patient presents with  . Abdominal Pain   HPI  64 year old female presents with the above complaint.   Patient reports that she has had ongoing pain in the lower abdomen for the past few days.  She reports urinary frequency.  No other urinary symptoms.  She reports chills.  No documented fever.  Pain 7/10 in severity.  No relieving factors.  Patient is concerned that she has UTI.  Patient does have a history of diverticulosis.  No other associated symptoms.  No other complaints.  Past Medical History:  Diagnosis Date  . Anxiety   . Carbuncle and furuncle of trunk   . HTN (hypertension)   . Hyperlipidemia   . Intrinsic asthma, unspecified   . Methicillin resistant Staphylococcus aureus in conditions classified elsewhere and of unspecified site   . Other psychological or physical stress, not elsewhere classified(V62.89)   . Tobacco abuse     Patient Active Problem List   Diagnosis Date Noted  . Anxiety   . Chronic obstructive pulmonary disease ([place]) 05/19/2018  . Diverticulosis 03/17/2017  . Allergic rhinitis 01/29/2017  . Hx of colonic polyps   . History of anxiety 03/10/2015  . HTN (hypertension)   . Hyperlipidemia     Past Surgical History:  Procedure Laterality Date  . COLONOSCOPY WITH PROPOFOL N/A 04/10/2015   Procedure: COLONOSCOPY WITH PROPOFOL;  Surgeon: Lucilla Lame, MD;  Location: [facility];  Service: Endoscopy;  Laterality: N/A;  . POLYPECTOMY  04/10/2015   Procedure: POLYPECTOMY;  Surgeon: Lucilla Lame, MD;  Location: [facility];  Service: Endoscopy;;  . STATUS POST ENDOMETRIAL ABLATION    . TONSILLECTOMY    . TUBAL LIGATION     bilateral, sterile ablation  . VARICOSE VEINS      OB History    Gravida  3   Para  3   Term      Preterm      AB      Living        SAB      TAB      Ectopic       Multiple      Live Births               Home Medications    Prior to Admission medications   Medication Sig Start Date End Date Taking? Authorizing Provider  albuterol (VENTOLIN HFA) 108 (90 Base) MCG/ACT inhaler USE 2 INHALATIONS BY MOUTH  EVERY 4 HOURS AS NEEDED FOR WHEEZING OR SHORTNESS OF  BREATH 07/14/20  Yes Fenton Malling M, PA-C  amLODipine (NORVASC) 10 MG tablet TAKE 1 TABLET BY MOUTH ONCE DAILY 06/22/20  Yes Fenton Malling M, PA-C  atorvastatin (LIPITOR) 20 MG tablet Take 1 tablet by mouth once daily 06/11/20  Yes Burnette, Jennifer M, PA-C  cetirizine (ZYRTEC) 10 MG tablet Take 1 tablet (10 mg total) by mouth daily. 01/13/20  Yes Fenton Malling M, PA-C  clonazePAM (KLONOPIN) 0.5 MG tablet Take 1 tablet by mouth twice daily as needed 07/31/20  Yes Mar Daring, PA-C  hydrochlorothiazide (HYDRODIURIL) 25 MG tablet Take 1 tablet by mouth once daily 05/27/20  Yes Burnette, Clearnce Sorrel, PA-C  MULTIPLE VITAMIN PO Take 1 tablet by mouth daily. Reported on 03/13/2016   Yes [provider]  amoxicillin-clavulanate (AUGMENTIN) 875-125 MG tablet Take 1  tablet by mouth 2 (two) times daily. 08/05/20   Kensley Lares G, DO  TRELEGY ELLIPTA 100-62.5-25 MCG/INH AEPB Inhale 1 puff into the lungs daily. 06/23/20   [provider]  fluticasone (FLONASE) 50 MCG/ACT nasal spray Place 2 sprays into both nostrils daily. 12/09/19 01/06/20  Mar Daring, PA-C    Family History Family History  Problem Relation Age of Onset  . Coronary artery disease Mother   . Hypertension Mother   . Rheum arthritis Mother   . Heart attack Mother   . Lung cancer Father   . Emphysema Father   . Gout Father   . Diabetes Sister   . Hypertension Daughter   . Hypertension Daughter   . Obesity Daughter   . Seizures Daughter   . Breast cancer Neg Hx   . Bladder Cancer Neg Hx   . Kidney cancer Neg Hx     Social History Social History   Tobacco Use  . Smoking status:  Current Every Day Smoker    Packs/day: 0.50    Years: 30.00    Pack years: 15.00    Types: Cigarettes    Last attempt to quit: 01/31/2017    Years since quitting: 3.5  . Smokeless tobacco: Never Used  Vaping Use  . Vaping Use: Never used  Substance Use Topics  . Alcohol use: No    Alcohol/week: 0.0 standard drinks  . Drug use: No     Allergies   Pravastatin sodium   Review of Systems Review of Systems  Gastrointestinal: Positive for abdominal pain.  Genitourinary: Positive for frequency.   Physical Exam Triage Vital Signs ED Triage Vitals  Enc Vitals Group     BP 08/05/20 1030 (!) 154/90     Pulse Rate 08/05/20 1030 89     Resp 08/05/20 1030 18     Temp 08/05/20 1030 97.8 F (36.6 C)     Temp Source 08/05/20 1030 Oral     SpO2 08/05/20 1030 100 %     Weight 08/05/20 1028 170 lb (77.1 kg)     Height 08/05/20 1028 5\' 7"  (1.702 m)     Head Circumference --      Peak Flow --      Pain Score 08/05/20 1027 7     Pain Loc --      Pain Edu? --      Excl. in [place]? --    Updated Vital Signs BP (!) 154/90 (BP Location: Left Arm)   Pulse 89   Temp 97.8 F (36.6 C) (Oral)   Resp 18   Ht 5\' 7"  (1.702 m)   Wt 77.1 kg   SpO2 100%   BMI 26.63 kg/m   Visual Acuity Right Eye Distance:   Left Eye Distance:   Bilateral Distance:    Right Eye Near:   Left Eye Near:    Bilateral Near:     Physical Exam Vitals and nursing note reviewed.  Constitutional:      General: She is not in acute distress.    Appearance: Normal appearance. She is not ill-appearing.  HENT:     Head: Normocephalic and atraumatic.  Cardiovascular:     Rate and Rhythm: Normal rate and regular rhythm.     Heart sounds: No murmur heard.   Pulmonary:     Effort: Pulmonary effort is normal.     Breath sounds: Normal breath sounds.  Abdominal:     General: There is no distension.     Palpations:  Abdomen is soft.     Comments: Mild tenderness of the LLQ and the suprapubic region.  Neurological:       Mental Status: She is alert.  Psychiatric:        Mood and Affect: Mood normal.        Behavior: Behavior normal.    UC Treatments / Results  Labs (all labs ordered are listed, but only abnormal results are displayed) Labs Reviewed  URINALYSIS, COMPLETE (UACMP) WITH MICROSCOPIC - Abnormal; Notable for the following components:      Result Value   Glucose, UA 100 (*)    All other components within normal limits    EKG   Radiology No results found.  Procedures Procedures (including critical care time)  Medications Ordered in UC Medications - No data to display  Initial Impression / Assessment and Plan / UC Course  I have reviewed the triage vital signs and the nursing notes.  Pertinent labs & imaging results that were available during my care of the patient were reviewed by me and considered in my medical decision making (see chart for details).    64 year old female presents with lower abdominal pain.  No evidence of UTI today.  She does have a history of diverticulosis.  Concern for diverticulitis.  Placing on Augmentin.  Final Clinical Impressions(s) / UC Diagnoses   Final diagnoses:  Lower abdominal pain  History of diverticulosis     Discharge Instructions     No evidence of UTI.  If you continue to feel poorly, start the antibiotic.  Take care  Dr. Lacinda Axon    ED Prescriptions    Medication Sig Dispense Auth. Provider   amoxicillin-clavulanate (AUGMENTIN) 875-125 MG tablet Take 1 tablet by mouth 2 (two) times daily. 14 tablet Coral Spikes, DO     PDMP not reviewed this encounter.   Coral Spikes, [place] 08/05/20 1114

## 2020-08-05 NOTE — ED Triage Notes (Signed)
Pt presents with c/o lower abdominal pain, chills, some pain/pressure when her bladder is full, increasing over the past few days. Pt denies dysuria, hematuria, pain or burning with urination. Pt denies any other symptoms.

## 2020-08-05 NOTE — Discharge Instructions (Addendum)
No evidence of UTI.  If you continue to feel poorly, start the antibiotic.  Take care  Dr. Lacinda Axon

## 2020-08-07 LAB — URINE CULTURE

## 2020-09-14 ENCOUNTER — Telehealth: Payer: Self-pay

## 2020-09-14 NOTE — Telephone Encounter (Signed)
Copied from [place] [phone]. Topic: General - Other >> Sep 14, 2020  9:12 AM [name] wrote: Reason for CRM: Patient  [name] called in due to her testing positive for COVID along with her husband. Patient is not able to do anything from feeling so bad cant even get out the bed. Asking if something come available today please call [name] Ph# [phone] has appointment 09/15/20 with D Chrismon

## 2020-09-15 ENCOUNTER — Encounter: Payer: Self-pay | Admitting: Family Medicine

## 2020-09-15 ENCOUNTER — Telehealth (INDEPENDENT_AMBULATORY_CARE_PROVIDER_SITE_OTHER): Payer: Medicare Other | Admitting: Family Medicine

## 2020-09-15 ENCOUNTER — Other Ambulatory Visit: Payer: Self-pay

## 2020-09-15 DIAGNOSIS — J441 Chronic obstructive pulmonary disease with (acute) exacerbation: Secondary | ICD-10-CM | POA: Diagnosis not present

## 2020-09-15 DIAGNOSIS — U071 COVID-19: Secondary | ICD-10-CM

## 2020-09-15 MED ORDER — AZITHROMYCIN 250 MG PO TABS: ORAL_TABLET | ORAL | 0 refills | Status: DC

## 2020-09-15 MED ORDER — PREDNISONE 10 MG PO TABS: ORAL_TABLET | ORAL | 0 refills | Status: DC

## 2020-09-15 NOTE — Telephone Encounter (Signed)
Will keep virtual appointment today. No open slots yesterday.

## 2020-09-15 NOTE — Progress Notes (Signed)
MyChart Video Visit    Virtual Visit via Video Note   This visit type was conducted due to national recommendations for restrictions regarding the COVID-19 Pandemic (e.g. social distancing) in an effort to limit this patient's exposure and mitigate transmission in our community. This patient is at least at moderate risk for complications without adequate follow up. This format is felt to be most appropriate for this patient at this time. Physical exam was limited by quality of the video and audio technology used for the visit.   Patient location: home Provider location: office  I discussed the limitations of evaluation and management by telemedicine and the availability of in person appointments. The patient expressed understanding and agreed to proceed.  Patient: Alexandria [name]   DOB: 1956/07/05   65 y.o. Female  MRN: [id number] Visit Date: 09/15/2020  Today's healthcare provider: Vernie Murders, PA-C   Chief Complaint  Patient presents with  . Covid Positive   Subjective    HPI  This 65 year old female has had headache, nausea, fatigue, sore throat, slight dyspnea on exertion but no fever or significant cough. Husband was worse and was taken to the hospital by ambulance on 09-05-20 with positive COVID test. He begged to go home but went back to the ER today with worsening dyspnea and is on BiPAP while waiting for an ICU bed. She got tested and on 09-10-20, results were positive for COVID-19. She has a history of COPD and presently feeling weak and only slight DOE without fever or chest pains.  Past Medical History:  Diagnosis Date  . Anxiety   . Carbuncle and furuncle of trunk   . HTN (hypertension)   . Hyperlipidemia   . Intrinsic asthma, unspecified   . Methicillin resistant Staphylococcus aureus in conditions classified elsewhere and of unspecified site   . Other psychological or physical stress, not elsewhere classified(V62.89)   . Tobacco abuse    Past Surgical History:   Procedure Laterality Date  . COLONOSCOPY WITH PROPOFOL N/A 04/10/2015   Procedure: COLONOSCOPY WITH PROPOFOL;  Surgeon: Lucilla Lame, MD;  Location: [facility];  Service: Endoscopy;  Laterality: N/A;  . POLYPECTOMY  04/10/2015   Procedure: POLYPECTOMY;  Surgeon: Lucilla Lame, MD;  Location: [facility];  Service: Endoscopy;;  . STATUS POST ENDOMETRIAL ABLATION    . TONSILLECTOMY    . TUBAL LIGATION     bilateral, sterile ablation  . VARICOSE VEINS     Social History   Tobacco Use  . Smoking status: Current Every Day Smoker    Packs/day: 0.50    Years: 30.00    Pack years: 15.00    Types: Cigarettes    Last attempt to quit: 01/31/2017    Years since quitting: 3.6  . Smokeless tobacco: Never Used  Vaping Use  . Vaping Use: Never used  Substance Use Topics  . Alcohol use: No    Alcohol/week: 0.0 standard drinks  . Drug use: No   Family History  Problem Relation Age of Onset  . Coronary artery disease Mother   . Hypertension Mother   . Rheum arthritis Mother   . Heart attack Mother   . Lung cancer Father   . Emphysema Father   . Gout Father   . Diabetes Sister   . Hypertension Daughter   . Hypertension Daughter   . Obesity Daughter   . Seizures Daughter   . Breast cancer Neg Hx   . Bladder Cancer Neg Hx   .  Kidney cancer Neg Hx    Allergies  Allergen Reactions  . Pravastatin Sodium     Myalgia      Medications: Outpatient Medications Prior to Visit  Medication Sig  . albuterol (VENTOLIN HFA) 108 (90 Base) MCG/ACT inhaler USE 2 INHALATIONS BY MOUTH  EVERY 4 HOURS AS NEEDED FOR WHEEZING OR SHORTNESS OF  BREATH  . amLODipine (NORVASC) 10 MG tablet TAKE 1 TABLET BY MOUTH ONCE DAILY  . amoxicillin-clavulanate (AUGMENTIN) 875-125 MG tablet Take 1 tablet by mouth 2 (two) times daily.  Marland Kitchen atorvastatin (LIPITOR) 20 MG tablet Take 1 tablet by mouth once daily  . cetirizine (ZYRTEC) 10 MG tablet Take 1 tablet (10 mg total) by mouth daily.  . clonazePAM  (KLONOPIN) 0.5 MG tablet Take 1 tablet by mouth twice daily as needed  . hydrochlorothiazide (HYDRODIURIL) 25 MG tablet Take 1 tablet by mouth once daily  . MULTIPLE VITAMIN PO Take 1 tablet by mouth daily. Reported on 03/13/2016  . TRELEGY ELLIPTA 100-62.5-25 MCG/INH AEPB Inhale 1 puff into the lungs daily.   No facility-administered medications prior to visit.    Review of Systems    Objective    There were no vitals taken for this visit.   Physical Exam: WDWN female in no apparent distress.  Head: Normocephalic, atraumatic. Neck: Supple, NROM Respiratory: No apparent distress at rest but becomes short of breathe with exertion (similar to exacerbation of her COPD). Psych: Normal mood and affect   Assessment & Plan     1. Acute exacerbation of chronic obstructive pulmonary disease (COPD) (HCC) Flare of DOE and fatigue with sore throat since 09-06-20. Continue Trelegy and Albuterol prn. Add prednisone taper and antibiotic. Isolate at home and increase fluid intake. Recheck prn.  - azithromycin (ZITHROMAX) 250 MG tablet; Take 2 tablets by mouth today, then 1 daily for 4 days.  Dispense: 6 each; Refill: 0 - predniSONE (DELTASONE) 10 MG tablet; Taper down by 1 tablet by mouth daily starting at 6 day 1, then, 5 day 2, 4 day 3, 3 day 4, 2 day 5 and 1 day 6. Divide dosage among meals and bedtime each day.  Dispense: 21 tablet; Refill: 0  2. COVID-19 virus infection Test results positive for COVID-19 on 09-10-20 after husband had to be take to the ER on 09-06-20. Feeling fatigued, DOE, headache, sore throat and some nausea without vomiting or diarrhea. May use Mucinex-DM and add prednisone taper with antibiotic with her history of COPD. Must go to the ER if more breathing difficulties. Maintain COVID restrictions and isolation protocols.   No follow-ups on file.     I discussed the assessment and treatment plan with the patient. The patient was provided an opportunity to ask questions and all  were answered. The patient agreed with the plan and demonstrated an understanding of the instructions.   The patient was advised to call back or seek an in-person evaluation if the symptoms worsen or if the condition fails to improve as anticipated.  I provided 20 minutes of non-face-to-face time during this encounter.  I, Taria Castrillo, PA-C, have reviewed all documentation for this visit. The documentation on 09/15/20 for the exam, diagnosis, procedures, and orders are all accurate and complete.   Vernie Murders, PA-C [employer] [phone] (phone) [phone] (fax)  [place]

## 2020-09-20 ENCOUNTER — Other Ambulatory Visit: Payer: Self-pay | Admitting: Physician Assistant

## 2020-09-20 DIAGNOSIS — E782 Mixed hyperlipidemia: Secondary | ICD-10-CM

## 2020-09-25 ENCOUNTER — Other Ambulatory Visit: Payer: Self-pay | Admitting: Pulmonary Disease

## 2020-11-26 ENCOUNTER — Other Ambulatory Visit: Payer: Self-pay | Admitting: Physician Assistant

## 2020-11-26 DIAGNOSIS — I10 Essential (primary) hypertension: Secondary | ICD-10-CM

## 2020-11-26 NOTE — Telephone Encounter (Signed)
No future visit noted. Approved per protocol.  Requested Prescriptions  Pending Prescriptions Disp Refills  . hydrochlorothiazide (HYDRODIURIL) 25 MG tablet [Pharmacy Med Name: hydroCHLOROthiazide 25 MG Oral Tablet] 90 tablet 0    Sig: Take 1 tablet by mouth once daily     Cardiovascular: Diuretics - Thiazide Failed - 11/26/2020  6:30 AM      Failed - Last BP in normal range    BP Readings from Last 1 Encounters:  08/05/20 (!) 154/90         Passed - Ca in normal range and within 360 days    Calcium  Date Value Ref Range Status  04/03/2020 10.1 8.7 - 10.3 mg/dL Final   Calcium, Total  Date Value Ref Range Status  12/25/2014 9.2 mg/dL Final    Comment:    8.9-10.3 NOTE: New Reference Range  11/08/14          Passed - Cr in normal range and within 360 days    Creatinine  Date Value Ref Range Status  12/26/2014 0.53 mg/dL Final    Comment:    0.44-1.00 NOTE: New Reference Range  11/08/14    Creatinine, Ser  Date Value Ref Range Status  04/03/2020 0.75 0.57 - 1.00 mg/dL Final         Passed - K in normal range and within 360 days    Potassium  Date Value Ref Range Status  04/03/2020 3.6 3.5 - 5.2 mmol/L Final  12/25/2014 3.6 mmol/L Final    Comment:    3.5-5.1 NOTE: New Reference Range  11/08/14          Passed - Na in normal range and within 360 days    Sodium  Date Value Ref Range Status  04/03/2020 140 134 - 144 mmol/L Final  12/25/2014 133 (L) mmol/L Final    Comment:    135-145 NOTE: New Reference Range  11/08/14          Passed - Valid encounter within last 6 months    Recent Outpatient Visits          2 months ago Acute exacerbation of chronic obstructive pulmonary disease (COPD) ([place])   [place], Vickki Muff, PA-C   7 months ago [employer] annual wellness visit, initial   [employer], [place], [place]   10 months ago Mixed simple and mucopurulent chronic bronchitis [facility])   [place], [place], [place]   11 months ago Seasonal allergies   [employer], Clearnce Sorrel, [place]   1 year ago Annual physical exam   [facility] Fenton Malling M, [place]

## 2020-12-08 ENCOUNTER — Other Ambulatory Visit: Payer: Self-pay | Admitting: Physician Assistant

## 2020-12-08 DIAGNOSIS — I10 Essential (primary) hypertension: Secondary | ICD-10-CM

## 2020-12-08 NOTE — Telephone Encounter (Signed)
Requested medications are due for refill today.  yes  Requested medications are on the active medications list.  yes  Last refill. 06/22/2020  Future visit scheduled.   no  Notes to clinic.  Pt was seen by Fenton Malling.

## 2020-12-18 ENCOUNTER — Other Ambulatory Visit: Payer: Self-pay | Admitting: Physician Assistant

## 2020-12-18 DIAGNOSIS — I10 Essential (primary) hypertension: Secondary | ICD-10-CM

## 2020-12-18 DIAGNOSIS — E782 Mixed hyperlipidemia: Secondary | ICD-10-CM

## 2020-12-19 MED ORDER — AMLODIPINE BESYLATE 10 MG PO TABS: 1.0000 | ORAL_TABLET | Freq: Every day | ORAL | 0 refills | Status: DC

## 2020-12-19 MED ORDER — ATORVASTATIN CALCIUM 20 MG PO TABS: 1.0000 | ORAL_TABLET | Freq: Every day | ORAL | 0 refills | Status: DC

## 2020-12-19 NOTE — Addendum Note (Signed)
Addended by: Althea Charon D on: 12/19/2020 12:08 PM   Modules accepted: Orders

## 2020-12-25 ENCOUNTER — Other Ambulatory Visit: Payer: Self-pay | Admitting: Pulmonary Disease

## 2021-01-31 ENCOUNTER — Other Ambulatory Visit: Payer: Self-pay | Admitting: Physician Assistant

## 2021-01-31 DIAGNOSIS — F419 Anxiety disorder, unspecified: Secondary | ICD-10-CM

## 2021-02-05 ENCOUNTER — Telehealth: Payer: Self-pay | Admitting: Physician Assistant

## 2021-02-05 DIAGNOSIS — F419 Anxiety disorder, unspecified: Secondary | ICD-10-CM

## 2021-02-05 NOTE — Telephone Encounter (Signed)
Please review. KW 

## 2021-02-05 NOTE — Telephone Encounter (Signed)
Pt called this morning Asking about the refill on her rx of klonopin .

## 2021-02-05 NOTE — Telephone Encounter (Signed)
[facility] faxed refill request for the following medications:  clonazePAM (KLONOPIN) 0.5 MG tablet  Last Rx: 07/31/20 Qty: 60 Refills: 5 LOV: 09/15/20 NOV: 04/05/21 with Simona Huh Please advise. Thanks TNP

## 2021-02-12 NOTE — Telephone Encounter (Signed)
Medication was sent in on 02/05/2021.

## 2021-02-19 ENCOUNTER — Other Ambulatory Visit: Payer: Self-pay | Admitting: Family Medicine

## 2021-02-19 DIAGNOSIS — E782 Mixed hyperlipidemia: Secondary | ICD-10-CM

## 2021-02-20 ENCOUNTER — Other Ambulatory Visit: Payer: Self-pay | Admitting: Physician Assistant

## 2021-02-20 DIAGNOSIS — I10 Essential (primary) hypertension: Secondary | ICD-10-CM

## 2021-02-21 ENCOUNTER — Other Ambulatory Visit: Payer: Self-pay | Admitting: Family Medicine

## 2021-02-21 DIAGNOSIS — E782 Mixed hyperlipidemia: Secondary | ICD-10-CM

## 2021-02-27 ENCOUNTER — Other Ambulatory Visit: Payer: Self-pay

## 2021-02-27 DIAGNOSIS — I10 Essential (primary) hypertension: Secondary | ICD-10-CM

## 2021-02-27 MED ORDER — HYDROCHLOROTHIAZIDE 25 MG PO TABS: 25.0000 mg | ORAL_TABLET | Freq: Every day | ORAL | 0 refills | Status: DC

## 2021-03-03 ENCOUNTER — Other Ambulatory Visit: Payer: Self-pay | Admitting: Family Medicine

## 2021-03-03 DIAGNOSIS — I10 Essential (primary) hypertension: Secondary | ICD-10-CM

## 2021-03-04 NOTE — Telephone Encounter (Signed)
Requested Prescriptions  Pending Prescriptions Disp Refills  . amLODipine (NORVASC) 10 MG tablet [Pharmacy Med Name: amLODIPine Besylate 10 MG Oral Tablet] 90 tablet 0    Sig: TAKE 1 TABLET BY MOUTH ONCE DAILY     Cardiovascular:  Calcium Channel Blockers Failed - 03/03/2021 10:58 PM      Failed - Last BP in normal range    BP Readings from Last 1 Encounters:  08/05/20 (!) 154/90         Passed - Valid encounter within last 6 months    Recent Outpatient Visits          5 months ago Acute exacerbation of chronic obstructive pulmonary disease (COPD) ([place])   [place], PA-C   11 months ago [employer] annual wellness visit, initial   [place], [place]   1 year ago Mixed simple and mucopurulent chronic bronchitis [facility])   [place], [place], [place]   1 year ago Seasonal allergies   [place], Clearnce Sorrel, [place]   1 year ago Annual physical exam   [facility] [place], Clearnce Sorrel, [place]      Future Appointments            In 1 month Chrismon, Vickki Muff, PA-C [employer], [place]

## 2021-03-15 ENCOUNTER — Other Ambulatory Visit: Payer: Self-pay | Admitting: Pulmonary Disease

## 2021-03-15 ENCOUNTER — Other Ambulatory Visit: Payer: Self-pay | Admitting: Family Medicine

## 2021-03-15 DIAGNOSIS — E782 Mixed hyperlipidemia: Secondary | ICD-10-CM

## 2021-03-15 NOTE — Telephone Encounter (Signed)
Medication Refill - Medication: Atorvastatin, Trelegy  Has the patient contacted their pharmacy? Yes.   Pt states that the pharmacy has advised her it would be best for her to contact PCP for refills. Pt has an appt on 04/05/21. Please advise.  (Agent: If no, request that the patient contact the pharmacy for the refill.) (Agent: If yes, when and what did the pharmacy advise?)  Preferred Pharmacy (with phone number or street name):  [facility] ([facility] [address]  [place] ([place] [address]  Phone: [phone] Fax: [phone]  Hours: Not open 24 hours    Agent: Please be advised that RX refills may take up to 3 business days. We ask that you follow-up with your pharmacy.

## 2021-03-26 ENCOUNTER — Other Ambulatory Visit: Payer: Self-pay | Admitting: Physician Assistant

## 2021-03-26 DIAGNOSIS — J449 Chronic obstructive pulmonary disease, unspecified: Secondary | ICD-10-CM

## 2021-03-27 ENCOUNTER — Other Ambulatory Visit: Payer: Self-pay | Admitting: Pulmonary Disease

## 2021-03-28 ENCOUNTER — Other Ambulatory Visit: Payer: Self-pay | Admitting: Physician Assistant

## 2021-03-28 MED ORDER — TRELEGY ELLIPTA 100-62.5-25 MCG/INH IN AEPB
INHALATION_SPRAY | RESPIRATORY_TRACT | 0 refills | Status: DC
Start: 2021-03-28 — End: 2021-04-05

## 2021-03-28 NOTE — Telephone Encounter (Signed)
Medication Refill - Medication: TRELEGY ELLIPTA 100-62.5-25 MCG/INH AEPB  Has the patient contacted their pharmacy? Yes.   (Agent: If no, request that the patient contact the pharmacy for the refill.) (Agent: If yes, when and what did the pharmacy advise?)  Preferred Pharmacy (with phone number or street name):  [facility] [address] Phone:  [phone]  Fax:  [phone]      Agent: Please be advised that RX refills may take up to 3 business days. We ask that you follow-up with your pharmacy.

## 2021-03-28 NOTE — Telephone Encounter (Signed)
  Notes to clinic:  Medication filled by a different provider Review for refill    Requested Prescriptions  Pending Prescriptions Disp Refills   Fluticasone-Umeclidin-Vilant (TRELEGY ELLIPTA) 100-62.5-25 MCG/INH AEPB 180 each 0    Sig: INHALE 1 PUFF ONCE DAILY      There is no refill protocol information for this order

## 2021-04-05 ENCOUNTER — Other Ambulatory Visit: Payer: Self-pay

## 2021-04-05 ENCOUNTER — Encounter: Payer: Self-pay | Admitting: Family Medicine

## 2021-04-05 ENCOUNTER — Ambulatory Visit (INDEPENDENT_AMBULATORY_CARE_PROVIDER_SITE_OTHER): Payer: Medicare Other | Admitting: Family Medicine

## 2021-04-05 VITALS — BP 144/87 | HR 85 | Temp 97.8°F | Resp 16 | Wt 199.0 lb

## 2021-04-05 DIAGNOSIS — I10 Essential (primary) hypertension: Secondary | ICD-10-CM

## 2021-04-05 DIAGNOSIS — F419 Anxiety disorder, unspecified: Secondary | ICD-10-CM | POA: Diagnosis not present

## 2021-04-05 DIAGNOSIS — E782 Mixed hyperlipidemia: Secondary | ICD-10-CM | POA: Diagnosis not present

## 2021-04-05 DIAGNOSIS — Z0000 Encounter for general adult medical examination without abnormal findings: Secondary | ICD-10-CM

## 2021-04-05 DIAGNOSIS — Z1211 Encounter for screening for malignant neoplasm of colon: Secondary | ICD-10-CM

## 2021-04-05 DIAGNOSIS — J449 Chronic obstructive pulmonary disease, unspecified: Secondary | ICD-10-CM

## 2021-04-05 DIAGNOSIS — R7303 Prediabetes: Secondary | ICD-10-CM

## 2021-04-05 MED ORDER — HYDROCHLOROTHIAZIDE 25 MG PO TABS: 25.0000 mg | ORAL_TABLET | Freq: Every day | ORAL | 3 refills | Status: DC

## 2021-04-05 MED ORDER — AMLODIPINE BESYLATE 10 MG PO TABS: 10.0000 mg | ORAL_TABLET | Freq: Every day | ORAL | 3 refills | Status: DC

## 2021-04-05 MED ORDER — CLONAZEPAM 0.5 MG PO TABS: 0.5000 mg | ORAL_TABLET | Freq: Two times a day (BID) | ORAL | 1 refills | Status: DC | PRN

## 2021-04-05 MED ORDER — ATORVASTATIN CALCIUM 20 MG PO TABS: 20.0000 mg | ORAL_TABLET | Freq: Every day | ORAL | 3 refills | Status: DC

## 2021-04-05 MED ORDER — TRELEGY ELLIPTA 100-62.5-25 MCG/INH IN AEPB: INHALATION_SPRAY | RESPIRATORY_TRACT | 0 refills | Status: DC

## 2021-04-05 NOTE — Progress Notes (Signed)
Complete physical exam   Patient: Alexandria [name]   DOB: May 30, 1956   65 y.o. Female  MRN: [id number] Visit Date: 04/05/2021  Today's healthcare provider: Vernie Murders, PA-C   Chief Complaint  Patient presents with   Annual Exam   Subjective    Alexandria [name] is a 65 y.o. female who presents today for a complete physical exam.  She reports consuming a general diet. The patient does not participate in regular exercise at present. She generally feels well. She reports sleeping well. She does not have additional problems to discuss today.    Past Medical History:  Diagnosis Date   Anxiety    Carbuncle and furuncle of trunk    HTN (hypertension)    Hyperlipidemia    Intrinsic asthma, unspecified    Methicillin resistant Staphylococcus aureus in conditions classified elsewhere and of unspecified site    Other psychological or physical stress, not elsewhere classified(V62.89)    Tobacco abuse    Past Surgical History:  Procedure Laterality Date   COLONOSCOPY WITH PROPOFOL N/A 04/10/2015   Procedure: COLONOSCOPY WITH PROPOFOL;  Surgeon: Lucilla Lame, MD;  Location: [facility];  Service: Endoscopy;  Laterality: N/A;   POLYPECTOMY  04/10/2015   Procedure: POLYPECTOMY;  Surgeon: Lucilla Lame, MD;  Location: [facility];  Service: Endoscopy;;   STATUS POST ENDOMETRIAL ABLATION     TONSILLECTOMY     TUBAL LIGATION     bilateral, sterile ablation   VARICOSE VEINS     Social History   Socioeconomic History   Marital status: Married    Spouse name: Remo Lipps   Number of children: 3   Years of education: 12   Highest education level: Not on file  Occupational History   Occupation: [employer]    Comment: CNA  Tobacco Use   Smoking status: Every Day    Packs/day: 0.50    Years: 30.00    Pack years: 15.00    Types: Cigarettes    Last attempt to quit: 01/31/2017    Years since quitting: 4.1   Smokeless tobacco: Never  Vaping Use   Vaping Use: Never used   Substance and Sexual Activity   Alcohol use: No    Alcohol/week: 0.0 standard drinks   Drug use: No   Sexual activity: Not on file  Other Topics Concern   Not on file  Social History Narrative   Not on file   Social Determinants of Health   Financial Resource Strain: Not on file  Food Insecurity: Not on file  Transportation Needs: Not on file  Physical Activity: Not on file  Stress: Not on file  Social Connections: Not on file  Intimate Partner Violence: Not on file   Family Status  Relation Name Status   Mother  Deceased   Father  Deceased at age 43   Sister  [place]   Sister  Alive       PACEMAKER   Daughter  Alive       OBESE AND HYPERTENSION   Daughter  Alive       OBESE AND HYPERTENSION   Daughter  Alda   Sister  (Not Specified)   Daughter  (Not Specified)   Daughter  (Not Specified)   Daughter  (Not Specified)   Daughter  (Not Specified)   Neg Hx  (  Not Specified)   Family History  Problem Relation Age of Onset   Coronary artery disease Mother    Hypertension Mother    Rheum arthritis Mother    Heart attack Mother    Lung cancer Father    Emphysema Father    Gout Father    Diabetes Sister    Hypertension Daughter    Hypertension Daughter    Obesity Daughter    Seizures Daughter    Breast cancer Neg Hx    Bladder Cancer Neg Hx    Kidney cancer Neg Hx    Allergies  Allergen Reactions   Pravastatin Sodium     Myalgia    Patient Care Team: Gwyneth Sprout, FNP as PCP - General (Family Medicine)   Medications: Outpatient Medications Prior to Visit  Medication Sig   albuterol (VENTOLIN HFA) 108 (90 Base) MCG/ACT inhaler USE 2 INHALATIONS BY MOUTH  EVERY 4 HOURS AS NEEDED FOR WHEEZING OR SHORTNESS OF  BREATH   amLODipine (NORVASC) 10 MG tablet TAKE 1 TABLET BY MOUTH ONCE DAILY   atorvastatin (LIPITOR) 20 MG tablet TAKE 1 TABLET BY MOUTH  DAILY   cetirizine (ZYRTEC) 10 MG  tablet Take 1 tablet (10 mg total) by mouth daily.   clonazePAM (KLONOPIN) 0.5 MG tablet Take 1 tablet by mouth twice daily as needed   Fluticasone-Umeclidin-Vilant (TRELEGY ELLIPTA) 100-62.5-25 MCG/INH AEPB INHALE 1 PUFF ONCE DAILY   hydrochlorothiazide (HYDRODIURIL) 25 MG tablet Take 1 tablet (25 mg total) by mouth daily.   MULTIPLE VITAMIN PO Take 1 tablet by mouth daily. Reported on 03/13/2016   amoxicillin-clavulanate (AUGMENTIN) 875-125 MG tablet Take 1 tablet by mouth 2 (two) times daily.   azithromycin (ZITHROMAX) 250 MG tablet Take 2 tablets by mouth today, then 1 daily for 4 days.   predniSONE (DELTASONE) 10 MG tablet Taper down by 1 tablet by mouth daily starting at 6 day 1, then, 5 day 2, 4 day 3, 3 day 4, 2 day 5 and 1 day 6. Divide dosage among meals and bedtime each day.   No facility-administered medications prior to visit.    Review of Systems  Constitutional: Negative.   HENT: Negative.    Eyes: Negative.   Respiratory: Negative.    Cardiovascular: Negative.   Gastrointestinal: Negative.   Endocrine: Negative.   Genitourinary: Negative.   Musculoskeletal:  Positive for arthralgias.  Skin: Negative.   Allergic/Immunologic: Negative.   Neurological: Negative.   Hematological: Negative.   Psychiatric/Behavioral: Negative.       Objective    BP (!) 144/87   Pulse 85   Temp 97.8 F (36.6 C)   Resp 16   Wt 199 lb (90.3 kg)   BMI 31.17 kg/m    Physical Exam Constitutional:      Appearance: She is well-developed.  HENT:     Head: Normocephalic and atraumatic.     Right Ear: External ear normal.     Left Ear: External ear normal.     Nose: Nose normal.  Eyes:     General:        Right eye: No discharge.     Conjunctiva/sclera: Conjunctivae normal.     Pupils: Pupils are equal, round, and reactive to light.  Neck:     Thyroid: No thyromegaly.     Trachea: No tracheal deviation.  Cardiovascular:     Rate and Rhythm: Normal rate and regular rhythm.      Heart sounds: Normal heart sounds. No murmur heard. Pulmonary:  Effort: Pulmonary effort is normal. No respiratory distress.     Breath sounds: Normal breath sounds. No wheezing or rales.  Chest:     Chest wall: No tenderness.  Abdominal:     General: There is no distension.     Palpations: Abdomen is soft. There is no mass.     Tenderness: There is no abdominal tenderness. There is no guarding or rebound.  Genitourinary:    Comments: Deferred to GYN. Musculoskeletal:        General: No tenderness. Normal range of motion.     Cervical back: Normal range of motion and neck supple.  Lymphadenopathy:     Cervical: No cervical adenopathy.  Skin:    General: Skin is warm and dry.     Findings: No erythema or rash.  Neurological:     Mental Status: She is alert and oriented to person, place, and time.     Cranial Nerves: No cranial nerve deficit.     Motor: No abnormal muscle tone.     Coordination: Coordination normal.     Deep Tendon Reflexes: Reflexes are normal and symmetric. Reflexes normal.  Psychiatric:        Behavior: Behavior normal.        Thought Content: Thought content normal.        Judgment: Judgment normal.    Diabetic Foot Form - Detailed   Diabetic Foot Exam - detailed Diabetic Foot exam was performed with the following findings: Yes 04/05/2021 10:06 AM  Visual Foot Exam completed.: Yes  Can the patient see the bottom of their feet?: Yes Are the shoes appropriate in style and fit?: Yes Is there swelling or and abnormal foot shape?: No Is there a claw toe deformity?: No Is there elevated skin temparature?: No Is there foot or ankle muscle weakness?: No Normal Range of Motion: Yes Pulse Foot Exam completed.: Yes   Right posterior Tibialias: Present Left posterior Tibialias: Present   Right Dorsalis Pedis: Present Left Dorsalis Pedis: Present  Sensory Foot Exam Completed.: Yes Semmes-Weinstein Monofilament Test R Site 1-Great Toe: Pos L Site 1-Great Toe:  Pos          Last depression screening scores PHQ 2/9 Scores 04/05/2021 04/03/2020 04/02/2019  PHQ - 2 Score 2 0 2  PHQ- 9 Score 2 - 4   Last fall risk screening Fall Risk  04/05/2021  Falls in the past year? 0  Number falls in past yr: 0  Injury with Fall? 0  Risk for fall due to : No Fall Risks  Follow up Falls evaluation completed   Last Audit-C alcohol use screening Alcohol Use Disorder Test (AUDIT) 04/05/2021  1. How often do you have a drink containing alcohol? 0  2. How many drinks containing alcohol do you have on a typical day when you are drinking? 0  3. How often do you have six or more drinks on one occasion? 0  AUDIT-C Score 0  Alcohol Brief Interventions/Follow-up -   A score of 3 or more in women, and 4 or more in men indicates increased risk for alcohol abuse, EXCEPT if all of the points are from question 1   No results found for any visits on 04/05/21.  Assessment & Plan    Routine Health Maintenance and Physical Exam  Exercise Activities and Dietary recommendations  Goals   Encouraged to walk 30-40 minutes 4-5 days a week for exercise and follow low fat diet to work on weight loss.  Immunization History  Administered Date(s) Administered   Influenza,inj,Quad PF,6+ Mos 05/19/2018   Influenza-Unspecified 06/14/2019   Pneumococcal Polysaccharide-23 03/10/2015, 04/03/2020   Tdap 03/17/2017   Zoster, Live 03/10/2015    Health Maintenance  Topic Date Due   COVID-19 Vaccine (1) Never done   Zoster Vaccines- Shingrix (1 of 2) Never done   DEXA SCAN  Never done   COLONOSCOPY (Pts 45-59yr Insurance coverage will need to be confirmed)  04/09/2020   INFLUENZA VACCINE  04/02/2021   PNA vac Low Risk Adult (2 of 2 - PCV13) 04/03/2021   MAMMOGRAM  05/24/2021   TETANUS/TDAP  03/18/2027   Hepatitis C Screening  Completed   HPV VACCINES  Aged Out    Discussed health benefits of physical activity, and encouraged her to engage in regular exercise appropriate  for her age and condition.  1. Annual physical exam Stable general health. She will check with insurance about coverage for immunizations and mammograms (states [facility] no longer in the network with her insurance).  2. Essential hypertension BP well controlled on Amlodipine and HCTZ. Recheck routine labs and monitor for changes. Denies chest pains or peripheral edema. - hydrochlorothiazide (HYDRODIURIL) 25 MG tablet; Take 1 tablet (25 mg total) by mouth daily.  Dispense: 90 tablet; Refill: 3 - amLODipine (NORVASC) 10 MG tablet; Take 1 tablet (10 mg total) by mouth daily.  Dispense: 90 tablet; Refill: 3 - CBC with Differential/Platelet - Comprehensive metabolic panel - Lipid panel - TSH  3. Anxiety Nervousness still an intermittent issue, but, controlled by Clonazepam prn use. Requests refill sent to mail order pharmacy. - clonazePAM (KLONOPIN) 0.5 MG tablet; Take 1 tablet (0.5 mg total) by mouth 2 (two) times daily as needed.  Dispense: 60 tablet; Refill: 1  4. Mixed hyperlipidemia Tolerating Lipitor without side effects. Recheck labs. - atorvastatin (LIPITOR) 20 MG tablet; Take 1 tablet (20 mg total) by mouth daily.  Dispense: 90 tablet; Refill: 3 - Comprehensive metabolic panel - Lipid panel - TSH  5. Colon cancer screening Due for repeat study with history of tubular adenomas and diverticulosis per Dr. WRolena Infante. - Ambulatory referral to Gastroenterology  6. Chronic obstructive pulmonary disease, unspecified COPD type ([place] Still smoking 0.5 ppd. Encouraged to stop all smoking. Still feels the Trelegy inhaler controls symptoms very well. - CBC with Differential/Platelet  7. Prediabetes Hgb A1C was 6.06 April 2020. No peripheral neuropathy, polyuria or polydipsia. Recheck labs. - CBC with Differential/Platelet - Comprehensive metabolic panel - Hemoglobin A1c   No follow-ups on file.     I, Zeola Brys, PA-C, have reviewed all documentation for this visit. The  documentation on 04/05/21 for the exam, diagnosis, procedures, and orders are all accurate and complete.    DVernie Murders PA-C  [employer] [phone](phone) [phone](fax)  [place]

## 2021-04-06 LAB — LIPID PANEL
Chol/HDL Ratio: 4 ratio (ref 0.0–4.4)
Cholesterol, Total: 173 mg/dL (ref 100–199)
HDL: 43 mg/dL (ref >39)
LDL Chol Calc (NIH): 107 mg/dL — ABNORMAL HIGH (ref 0–99)
Triglycerides: 127 mg/dL (ref 0–149)
VLDL Cholesterol Cal: 23 mg/dL (ref 5–40)

## 2021-04-06 LAB — COMPREHENSIVE METABOLIC PANEL
ALT: 30 IU/L (ref 0–32)
AST: 22 IU/L (ref 0–40)
Albumin/Globulin Ratio: 1.9 (ref 1.2–2.2)
Albumin: 4.4 g/dL (ref 3.8–4.8)
Alkaline Phosphatase: 130 IU/L — ABNORMAL HIGH (ref 44–121)
BUN/Creatinine Ratio: 11 — ABNORMAL LOW (ref 12–28)
BUN: 9 mg/dL (ref 8–27)
Bilirubin Total: 0.4 mg/dL (ref 0.0–1.2)
CO2: 26 mmol/L (ref 20–29)
Calcium: 10.5 mg/dL — ABNORMAL HIGH (ref 8.7–10.3)
Chloride: 97 mmol/L (ref 96–106)
Creatinine, Ser: 0.84 mg/dL (ref 0.57–1.00)
Globulin, Total: 2.3 g/dL (ref 1.5–4.5)
Glucose: 132 mg/dL — ABNORMAL HIGH (ref 65–99)
Potassium: 4.2 mmol/L (ref 3.5–5.2)
Sodium: 140 mmol/L (ref 134–144)
Total Protein: 6.7 g/dL (ref 6.0–8.5)
eGFR: 77 mL/min/{1.73_m2} (ref >59)

## 2021-04-06 LAB — CBC WITH DIFFERENTIAL/PLATELET
Basophils Absolute: 0.1 10*3/uL (ref 0.0–0.2)
Basos: 1 %
EOS (ABSOLUTE): 0.1 10*3/uL (ref 0.0–0.4)
Eos: 1 %
Hematocrit: 48.3 % — ABNORMAL HIGH (ref 34.0–46.6)
Hemoglobin: 16.3 g/dL — ABNORMAL HIGH (ref 11.1–15.9)
Immature Grans (Abs): 0 10*3/uL (ref 0.0–0.1)
Immature Granulocytes: 0 %
Lymphocytes Absolute: 3.4 10*3/uL — ABNORMAL HIGH (ref 0.7–3.1)
Lymphs: 35 %
MCH: 29.3 pg (ref 26.6–33.0)
MCHC: 33.7 g/dL (ref 31.5–35.7)
MCV: 87 fL (ref 79–97)
Monocytes Absolute: 0.7 10*3/uL (ref 0.1–0.9)
Monocytes: 7 %
Neutrophils Absolute: 5.4 10*3/uL (ref 1.4–7.0)
Neutrophils: 56 %
Platelets: 314 10*3/uL (ref 150–450)
RBC: 5.56 x10E6/uL — ABNORMAL HIGH (ref 3.77–5.28)
RDW: 13.1 % (ref 11.7–15.4)
WBC: 9.7 10*3/uL (ref 3.4–10.8)

## 2021-04-06 LAB — HEMOGLOBIN A1C
Est. average glucose Bld gHb Est-mCnc: 154 mg/dL
Hgb A1c MFr Bld: 7 % — ABNORMAL HIGH (ref 4.8–5.6)

## 2021-04-06 LAB — TSH: TSH: 1.96 u[IU]/mL (ref 0.450–4.500)

## 2021-04-10 ENCOUNTER — Other Ambulatory Visit: Payer: Self-pay

## 2021-04-10 MED ORDER — METFORMIN HCL 500 MG PO TABS: 500.0000 mg | ORAL_TABLET | Freq: Every day | ORAL | 1 refills | Status: DC

## 2021-04-19 ENCOUNTER — Telehealth (INDEPENDENT_AMBULATORY_CARE_PROVIDER_SITE_OTHER): Payer: Self-pay | Admitting: Gastroenterology

## 2021-04-19 DIAGNOSIS — Z1211 Encounter for screening for malignant neoplasm of colon: Secondary | ICD-10-CM

## 2021-04-19 MED ORDER — CLENPIQ 10-3.5-12 MG-GM -GM/160ML PO SOLN: 1.0000 | ORAL | 0 refills | Status: DC

## 2021-04-19 NOTE — Progress Notes (Signed)
Gastroenterology Pre-Procedure Review  Request Date: 06/25/2021 Requesting Physician: Dr. Allen Norris  PATIENT REVIEW QUESTIONS: The patient responded to the following health history questions as indicated:    1. Are you having any GI issues? no 2. Do you have a personal history of Polyps? yes (SOME REMOVED 2016) 3. Do you have a family history of Colon Cancer or Polyps? no 4. Diabetes Mellitus? no 5. Joint replacements in the past 12 months?no 6. Major health problems in the past 3 months?no 7. Any artificial heart valves, MVP, or defibrillator?no    MEDICATIONS & ALLERGIES:    Patient reports the following regarding taking any anticoagulation/antiplatelet therapy:   Plavix, Coumadin, Eliquis, Xarelto, Lovenox, Pradaxa, Brilinta, or Effient? no Aspirin? yes (81MG)  Patient confirms/reports the following medications:  Current Outpatient Medications  Medication Sig Dispense Refill   albuterol (VENTOLIN HFA) 108 (90 Base) MCG/ACT inhaler USE 2 INHALATIONS BY MOUTH  EVERY 4 HOURS AS NEEDED FOR WHEEZING OR SHORTNESS OF  BREATH 40.2 g 3   amLODipine (NORVASC) 10 MG tablet Take 1 tablet (10 mg total) by mouth daily. 90 tablet 3   atorvastatin (LIPITOR) 20 MG tablet Take 1 tablet (20 mg total) by mouth daily. 90 tablet 3   cetirizine (ZYRTEC) 10 MG tablet Take 1 tablet (10 mg total) by mouth daily. 30 tablet 11   clonazePAM (KLONOPIN) 0.5 MG tablet Take 1 tablet (0.5 mg total) by mouth 2 (two) times daily as needed. 60 tablet 1   Fluticasone-Umeclidin-Vilant (TRELEGY ELLIPTA) 100-62.5-25 MCG/INH AEPB INHALE 1 PUFF ONCE DAILY 180 each 0   hydrochlorothiazide (HYDRODIURIL) 25 MG tablet Take 1 tablet (25 mg total) by mouth daily. 90 tablet 3   metFORMIN (GLUCOPHAGE) 500 MG tablet Take 1 tablet (500 mg total) by mouth daily with breakfast. 90 tablet 1   MULTIPLE VITAMIN PO Take 1 tablet by mouth daily. Reported on 03/13/2016     Sod Picosulfate-Mag Ox-Cit Acd (CLENPIQ) 10-3.5-12 MG-GM -GM/160ML SOLN  Take 1 kit by mouth as directed. At 5 PM evening before procedure, drink 1 bottle of Clenpiq, hydrate, drink (5) 8 oz of water. Then do the same thing 5 hours prior to your procedure. 320 mL 0   azithromycin (ZITHROMAX) 250 MG tablet Take 2 tablets by mouth today, then 1 daily for 4 days. 6 each 0   predniSONE (DELTASONE) 10 MG tablet Taper down by 1 tablet by mouth daily starting at 6 day 1, then, 5 day 2, 4 day 3, 3 day 4, 2 day 5 and 1 day 6. Divide dosage among meals and bedtime each day. 21 tablet 0   No current facility-administered medications for this visit.    Patient confirms/reports the following allergies:  Allergies  Allergen Reactions   Pravastatin Sodium     Myalgia    Orders Placed This Encounter  Procedures   Procedural/ Surgical Case Request: COLONOSCOPY WITH PROPOFOL    Standing Status:   Standing    Number of Occurrences:   1    Order Specific Question:   Pre-op diagnosis    Answer:   Colon cancer screening z12.11    Order Specific Question:   CPT Code    Answer:   27253    AUTHORIZATION INFORMATION Primary Insurance: 1D#: Group #:  Secondary Insurance: 1D#: Group #:  SCHEDULE INFORMATION: Date: 06/25/2021 Time: Location: [facility]

## 2021-04-24 LAB — HM DIABETES EYE EXAM

## 2021-05-25 ENCOUNTER — Telehealth: Payer: Medicare Other | Admitting: Physician Assistant

## 2021-05-25 ENCOUNTER — Ambulatory Visit: Payer: Self-pay

## 2021-05-25 DIAGNOSIS — B370 Candidal stomatitis: Secondary | ICD-10-CM | POA: Diagnosis not present

## 2021-05-25 MED ORDER — NYSTATIN 100000 UNIT/ML MT SUSP: 5.0000 mL | Freq: Four times a day (QID) | OROMUCOSAL | 0 refills | Status: DC

## 2021-05-25 NOTE — Telephone Encounter (Signed)
Pt. Reports on her last appointment her PCP told her she had thrush. Has tried the home remedies he suggested. Has used mouth wash and 1/2 strength hydrogen oeroxide. States it is worse. Tongue is sore and red in middle, white on the sides. Uses Trelegy inhaler and does not know if this has contributed. Declines in office appointment - does not have a car presently. Requesting medication be sent to pharmacy. Please advise pt.  Message from Lennox Solders sent at 05/25/2021  3:07 PM EDT  Pt daughter name is Metallurgist   ----- Message from Lennox Solders sent at 05/25/2021  3:03 PM EDT -----  Pt daughter Verdon Cummins is calling her mother seen dennis on 04-05-2021 and she had thrush in her mouth and per daughter dennis told her if it gets worst he would call her in some medication. Please advise     Call History   Type Contact Phone/Fax User  05/25/2021 02:58 PM EDT Phone (Incoming) [name], Alexandria (Self) [phone] Lemmie Evens) Andrena Mews J   Answer Assessment - Initial Assessment Questions 1. SYMPTOM: "What's the main symptom you're concerned about?" (e.g., chapped lips, dry mouth, lump, sores)     Thrush 2. ONSET: "When did the  symptoms start?"     August 3. PAIN: "Is there any pain?" If Yes, ask: "How bad is it?" (Scale: 1-10; mild, moderate, severe)   - MILD (1-3):  doesn't interfere with eating or normal activities   - MODERATE (4-7): interferes with eating some solids and normal activities   - SEVERE (8-10):  excruciating pain, interferes with most normal activities   - SEVERE DYSPHAGIA: can't swallow liquids, drooling     Moderate 4. CAUSE: "What do you think is causing the symptoms?"     Thrush 5. OTHER SYMPTOMS: "Do you have any other symptoms?" (e.g., fever, sore throat, toothache, swelling)     No 6. PREGNANCY: "Is there any chance you are pregnant?" "When was your last menstrual period?"     No  Protocols used: Mouth Symptoms-A-AH

## 2021-05-25 NOTE — Progress Notes (Signed)
E-Visit for Mouth Ulcers  We are sorry that you are not feeling well.  Here is how we plan to help!  Based on what you have shared with me, it appears that you do have mouth ulcer(s).     The following medications should decrease the discomfort and help with healing. Nystatin suspension for thrush  Mouth ulcers are painful areas in the mouth and gums. These are also known as "canker sores".  They can occur anywhere inside the mouth. While mostly harmless, mouth ulcers can be extremely uncomfortable and may make it difficult to eat, drink, and brush your teeth.  You may have more than 1 ulcer and they can vary and change in size. Mouth ulcers are not contagious and should not be confused with cold sores.  Cold sores appear on the lip or around the outside of the mouth and often begin with a tingling, burning or itching sensation.   While the exact causes are unknown, some common causes and factors that may aggravate mouth ulcers include: Genetics - Sometimes mouth ulcers run in families High alcohol intake Acidic foods such as citrus fruits like pineapple, grapefruit, orange fruits/juices, may aggravate mouth ulcers Other foods high in acidity or spice such as coffee, chocolate, chips, pretzels, eggs, nuts, cheese Quitting smoking Injury caused by biting the tongue or inside of the cheek Diet lacking in J-19, zinc, folic acid or iron Female hormone shifts with menstruation Excessive fatigue, emotional stress or anxiety Prevention: Talk to your doctor if you are taking meds that are known to cause mouth ulcers such as:   Anti-inflammatory drugs (for example Ibuprofen, Naproxen sodium), pain killers, Beta blockers, Oral nicotine replacement drugs, Some street drugs (heroin).   Avoid allowing any tablets to dissolve in your mouth that are meant to swallowed whole Avoid foods/drinks that trigger or worsen symptoms Keep your mouth clean with daily brushing and flossing  Home Care: The goal  with treatment is to ease the pain where ulcers occur and help them heal as quickly as possible.  There is no medical treatment to prevent mouth ulcers from coming back or recurring.  Avoid spicy and acidic foods Eat soft foods and avoid rough, crunchy foods Avoid chewing gum Do not use toothpaste that contains sodium lauryl sulphite Use a straw to drink which helps avoid liquids toughing the ulcers near the front of your mouth Use a very soft toothbrush If you have dentures or dental hardware that you feel is not fitting well or contributing to his, please see your dentist. Use saltwater mouthwash which helps healing. Dissolve a  teaspoon of salt in a glass of warm water. Swish around your mouth and spit it out. This can be used as needed if it is soothing.   GET HELP RIGHT AWAY IF: Persistent ulcers require checking IN PERSON (face to face). Any mouth lesion lasting longer than a month should be seen by your DENTIST as soon as possible for evaluation for possible oral cancer. If you have a non-painful ulcer in 1 or more areas of your mouth Ulcers that are spreading, are very large or particularly painful Ulcers last longer than one week without improving on treatment If you develop a fever, swollen glands and begin to feel unwell Ulcers that developed after starting a new medication MAKE SURE YOU: Understand these instructions. Will watch your condition. Will get help right away if you are not doing well or get worse.  Thank you for choosing an e-visit.  Your e-visit answers  were reviewed by a board certified advanced clinical practitioner to complete your personal care plan. Depending upon the condition, your plan could have included both over the counter or prescription medications.  Please review your pharmacy choice. Make sure the pharmacy is open so you can pick up prescription now. If there is a problem, you may contact your provider through [employer] and have the  prescription routed to another pharmacy.  Your safety is important to [place]. If you have drug allergies check your prescription carefully.   For the next 24 hours you can use MyChart to ask questions about today's visit, request a non-urgent call back, or ask for a work or school excuse. You will get an email in the next two days asking about your experience. I hope that your e-visit has been valuable and will speed your recovery.  I provided 5 minutes of non face-to-face time during this encounter for chart review and documentation.

## 2021-05-25 NOTE — Telephone Encounter (Signed)
Patient advised to do a virtual visit through [facility] as all providers have left for the day. Patient reports that she may try to do that if not she will call back on Monday.

## 2021-06-06 ENCOUNTER — Encounter: Payer: Self-pay | Admitting: Gastroenterology

## 2021-06-20 ENCOUNTER — Other Ambulatory Visit: Payer: Self-pay | Admitting: Physician Assistant

## 2021-06-20 ENCOUNTER — Encounter: Payer: Self-pay | Admitting: Family Medicine

## 2021-06-20 ENCOUNTER — Telehealth (INDEPENDENT_AMBULATORY_CARE_PROVIDER_SITE_OTHER): Payer: Medicare Other | Admitting: Family Medicine

## 2021-06-20 DIAGNOSIS — B370 Candidal stomatitis: Secondary | ICD-10-CM | POA: Diagnosis not present

## 2021-06-20 MED ORDER — FLUCONAZOLE 150 MG PO TABS
ORAL_TABLET | ORAL | 0 refills | Status: DC
Start: 2021-06-20 — End: 2021-12-03

## 2021-06-20 NOTE — Assessment & Plan Note (Addendum)
Did not clear with oral treatment; plan to treat with PO medication for 1 week.   Encouraged to change toothbrush etc

## 2021-06-20 NOTE — Progress Notes (Signed)
MyChart Video Visit    Virtual Visit via Video Note   This visit type was conducted due to national recommendations for restrictions regarding the COVID-19 Pandemic (e.g. social distancing) in an effort to limit this patient's exposure and mitigate transmission in our community. This patient is at least at moderate risk for complications without adequate follow up. This format is felt to be most appropriate for this patient at this time. Physical exam was limited by quality of the video and audio technology used for the visit.   Patient location: home, with daughter Alexandria [name] Provider location: [facility], exam room 2  I discussed the limitations of evaluation and management by telemedicine and the availability of in person appointments. The patient expressed understanding and agreed to proceed.  Patient: Alexandria [name]   DOB: August 14, 1956   65 y.o. Female  MRN: [id number] Visit Date: 06/20/2021  Today's healthcare provider: Gwyneth Sprout, FNP   Chief Complaint  Patient presents with   Alexandria [name]    Patient presents in office today with worsening symptoms of thrush, daughter who is accompanied with patient states that tongue is white and reports that nystatin mouth rinse prescribed is not helping with symptoms.    Subjective    HPI HPI     Thrush    Additional comments: Patient presents in office today with worsening symptoms of thrush, daughter who is accompanied with patient states that tongue is white and reports that nystatin mouth rinse prescribed is not helping with symptoms.       Last edited by Minette Headland, CMA on 06/20/2021 10:48 AM.        Medications: Outpatient Medications Prior to Visit  Medication Sig   albuterol (VENTOLIN HFA) 108 (90 Base) MCG/ACT inhaler USE 2 INHALATIONS BY MOUTH  EVERY 4 HOURS AS NEEDED FOR WHEEZING OR SHORTNESS OF  BREATH   amLODipine (NORVASC) 10 MG tablet Take 1 tablet (10 mg total) by mouth daily.   ASPIRIN 81 PO Take by  mouth.   atorvastatin (LIPITOR) 20 MG tablet Take 1 tablet (20 mg total) by mouth daily.   cetirizine (ZYRTEC) 10 MG tablet Take 1 tablet (10 mg total) by mouth daily.   clonazePAM (KLONOPIN) 0.5 MG tablet Take 1 tablet (0.5 mg total) by mouth 2 (two) times daily as needed.   Fluticasone-Umeclidin-Vilant (TRELEGY ELLIPTA) 100-62.5-25 MCG/INH AEPB INHALE 1 PUFF ONCE DAILY   hydrochlorothiazide (HYDRODIURIL) 25 MG tablet Take 1 tablet (25 mg total) by mouth daily.   metFORMIN (GLUCOPHAGE) 500 MG tablet Take 1 tablet (500 mg total) by mouth daily with breakfast.   MULTIPLE VITAMIN PO Take 1 tablet by mouth daily. Reported on 03/13/2016   nystatin (MYCOSTATIN) 100000 UNIT/ML suspension Take 5 mLs (500,000 Units total) by mouth 4 (four) times daily.   Sod Picosulfate-Mag Ox-Cit Acd (CLENPIQ) 10-3.5-12 MG-GM -GM/160ML SOLN Take 1 kit by mouth as directed. At 5 PM evening before procedure, drink 1 bottle of Clenpiq, hydrate, drink (5) 8 oz of water. Then do the same thing 5 hours prior to your procedure.   No facility-administered medications prior to visit.    Review of Systems    Objective    There were no vitals taken for this visit.   Physical Exam HENT:     Head: Normocephalic and atraumatic.     Mouth/Throat:   Neurological:     Mental Status: She is alert.       Assessment & Plan     Problem List  Items Addressed This Visit       Digestive   Thrush - Primary    Did not clear with oral treatment; plan to treat with PO medication for 1 week.   Encouraged to change toothbrush etc      Relevant Medications   fluconazole (DIFLUCAN) 150 MG tablet     Return if symptoms worsen or fail to improve.     I discussed the assessment and treatment plan with the patient. The patient was provided an opportunity to ask questions and all were answered. The patient agreed with the plan and demonstrated an understanding of the instructions.   The patient was advised to call back or  seek an in-person evaluation if the symptoms worsen or if the condition fails to improve as anticipated.  I provided 8 minutes of non-face-to-face time during this encounter. Vonna Kotyk, FNP, have reviewed all documentation for this visit. The documentation on 06/20/21 for the exam, diagnosis, procedures, and orders are all accurate and complete.   Gwyneth Sprout, [place] [phone] (phone) [phone] (fax)  [place]

## 2021-06-25 ENCOUNTER — Ambulatory Visit: Payer: Medicare Other | Admitting: Anesthesiology

## 2021-06-25 ENCOUNTER — Encounter: Payer: Self-pay | Admitting: Gastroenterology

## 2021-06-25 ENCOUNTER — Encounter
Admission: RE | Disposition: A | Discharge status: Home / Self Care | Payer: Self-pay | Source: Home / Self Care | Attending: Gastroenterology

## 2021-06-25 ENCOUNTER — Ambulatory Visit
Admission: RE | Admit: 2021-06-25 | Discharge: 2021-06-25 | Disposition: A | Discharge status: Home / Self Care | Payer: Medicare Other | Attending: Gastroenterology | Admitting: Gastroenterology

## 2021-06-25 ENCOUNTER — Other Ambulatory Visit: Payer: Self-pay

## 2021-06-25 DIAGNOSIS — D122 Benign neoplasm of ascending colon: Secondary | ICD-10-CM | POA: Insufficient documentation

## 2021-06-25 DIAGNOSIS — Z7982 Long term (current) use of aspirin: Secondary | ICD-10-CM | POA: Insufficient documentation

## 2021-06-25 DIAGNOSIS — K5730 Diverticulosis of large intestine without perforation or abscess without bleeding: Secondary | ICD-10-CM | POA: Diagnosis not present

## 2021-06-25 DIAGNOSIS — Z79899 Other long term (current) drug therapy: Secondary | ICD-10-CM | POA: Insufficient documentation

## 2021-06-25 DIAGNOSIS — Z7984 Long term (current) use of oral hypoglycemic drugs: Secondary | ICD-10-CM | POA: Insufficient documentation

## 2021-06-25 DIAGNOSIS — Z860100 Personal history of colon polyps, unspecified: Secondary | ICD-10-CM

## 2021-06-25 DIAGNOSIS — F17210 Nicotine dependence, cigarettes, uncomplicated: Secondary | ICD-10-CM | POA: Diagnosis not present

## 2021-06-25 DIAGNOSIS — K635 Polyp of colon: Secondary | ICD-10-CM | POA: Diagnosis not present

## 2021-06-25 DIAGNOSIS — Z1211 Encounter for screening for malignant neoplasm of colon: Secondary | ICD-10-CM

## 2021-06-25 DIAGNOSIS — K621 Rectal polyp: Secondary | ICD-10-CM | POA: Insufficient documentation

## 2021-06-25 DIAGNOSIS — D128 Benign neoplasm of rectum: Secondary | ICD-10-CM | POA: Diagnosis not present

## 2021-06-25 DIAGNOSIS — Z7951 Long term (current) use of inhaled steroids: Secondary | ICD-10-CM | POA: Diagnosis not present

## 2021-06-25 HISTORY — DX: Prediabetes: R73.03

## 2021-06-25 HISTORY — PX: POLYPECTOMY: SHX5525

## 2021-06-25 HISTORY — PX: COLONOSCOPY WITH PROPOFOL: SHX5780

## 2021-06-25 LAB — GLUCOSE, CAPILLARY
Glucose-Capillary: 127 mg/dL — ABNORMAL HIGH (ref 70–99)
Glucose-Capillary: 119 mg/dL — ABNORMAL HIGH (ref 70–99)

## 2021-06-25 SURGERY — COLONOSCOPY WITH PROPOFOL
Anesthesia: General | Site: Rectum

## 2021-06-25 MED ORDER — LIDOCAINE HCL (CARDIAC) PF 100 MG/5ML IV SOSY
PREFILLED_SYRINGE | INTRAVENOUS | Status: DC | PRN
  Administered 2021-06-25: 50 mg via INTRAVENOUS

## 2021-06-25 MED ORDER — PROPOFOL 10 MG/ML IV BOLUS
INTRAVENOUS | Status: DC | PRN
  Administered 2021-06-25 (×2): 20 mg via INTRAVENOUS
  Administered 2021-06-25: 30 mg via INTRAVENOUS
  Administered 2021-06-25: 70 mg via INTRAVENOUS
  Administered 2021-06-25 (×2): 20 mg via INTRAVENOUS
  Administered 2021-06-25: 30 mg via INTRAVENOUS
  Administered 2021-06-25 (×3): 20 mg via INTRAVENOUS

## 2021-06-25 MED ORDER — SODIUM CHLORIDE 0.9 % IV SOLN: INTRAVENOUS | Status: DC

## 2021-06-25 MED ORDER — LACTATED RINGERS IV SOLN: INTRAVENOUS | Status: DC

## 2021-06-25 MED ORDER — STERILE WATER FOR IRRIGATION IR SOLN
Status: DC | PRN
  Administered 2021-06-25: 1

## 2021-06-25 SURGICAL SUPPLY — 22 items
CLIP HMST 235XBRD CATH ROT (MISCELLANEOUS) IMPLANT
CLIP RESOLUTION 360 11X235 (MISCELLANEOUS)
ELECT REM PT RETURN 9FT ADLT (ELECTROSURGICAL)
ELECTRODE REM PT RTRN 9FT ADLT (ELECTROSURGICAL) IMPLANT
FORCEPS BIOP RAD 4 LRG CAP 4 (CUTTING FORCEPS) IMPLANT
GOWN CVR UNV OPN BCK APRN NK (MISCELLANEOUS) ×2 IMPLANT
GOWN ISOL THUMB LOOP REG UNIV (MISCELLANEOUS) ×4
INJECTOR VARIJECT VIN23 (MISCELLANEOUS) IMPLANT
KIT DEFENDO VALVE AND CONN (KITS) IMPLANT
KIT PRC NS LF DISP ENDO (KITS) ×1 IMPLANT
KIT PROCEDURE OLYMPUS (KITS) ×2
MANIFOLD NEPTUNE II (INSTRUMENTS) ×2 IMPLANT
MARKER SPOT ENDO TATTOO 5ML (MISCELLANEOUS) IMPLANT
PROBE APC STR FIRE (PROBE) IMPLANT
RETRIEVER NET ROTH 2.5X230 LF (MISCELLANEOUS) IMPLANT
SNARE COLD EXACTO (MISCELLANEOUS) ×2 IMPLANT
SNARE SHORT THROW 13M SML OVAL (MISCELLANEOUS) IMPLANT
SNARE SNG USE RND 15MM (INSTRUMENTS) IMPLANT
SPOT EX ENDOSCOPIC TATTOO (MISCELLANEOUS)
TRAP ETRAP POLY (MISCELLANEOUS) ×2 IMPLANT
VARIJECT INJECTOR VIN23 (MISCELLANEOUS)
WATER STERILE IRR 250ML POUR (IV SOLUTION) ×2 IMPLANT

## 2021-06-25 NOTE — Op Note (Addendum)
[facility]astroenterology Patient Name: Alexandria [name] Procedure Date: 06/25/2021 8:46 AM MRN: [id number] Account #: [id number] Date of Birth: December 12, 1955 Admit Type: Outpatient Age: 65 Room: [facility] OR ROOM 01 Gender: Female Note Status: Finalized Instrument Name: [id number] Procedure:             Colonoscopy Indications:           High risk colon cancer surveillance: Personal history                         of colonic polyps Providers:             Lucilla Lame MD, MD Referring MD:          Tally Joe NP Medicines:             Propofol per Anesthesia Complications:         No immediate complications. Procedure:             Pre-Anesthesia Assessment:                        - Prior to the procedure, a History and Physical was                         performed, and patient medications and allergies were                         reviewed. The patient's tolerance of previous                         anesthesia was also reviewed. The risks and benefits                         of the procedure and the sedation options and risks                         were discussed with the patient. All questions were                         answered, and informed consent was obtained. Prior                         Anticoagulants: The patient has taken no previous                         anticoagulant or antiplatelet agents. ASA Grade                         Assessment: II - A patient with mild systemic disease.                         After reviewing the risks and benefits, the patient                         was deemed in satisfactory condition to undergo the                         procedure.  After obtaining informed consent, the colonoscope was                         passed under direct vision. Throughout the procedure,                         the patient's blood pressure, pulse, and oxygen                         saturations were monitored continuously. The                          Colonoscope was introduced through the anus and                         advanced to the the cecum, identified by appendiceal                         orifice and ileocecal valve. The colonoscopy was                         performed without difficulty. The patient tolerated                         the procedure well. The quality of the bowel                         preparation was excellent. Findings:      The perianal and digital rectal examinations were normal.      A few small-mouthed diverticula were found in the sigmoid colon.      A 7 mm polyp was found in the ascending colon. The polyp was sessile.       The polyp was removed with a cold snare. Resection and retrieval were       complete.      Two sessile polyps were found in the rectum. The polyps were 2 to 4 mm       in size. These polyps were removed with a cold snare. Resection and       retrieval were complete. Impression:            - Diverticulosis in the sigmoid colon.                        - One 7 mm polyp in the ascending colon, removed with                         a cold snare. Resected and retrieved.                        - Two 2 to 4 mm polyps in the rectum, removed with a                         cold snare. Resected and retrieved. Recommendation:        - Discharge patient to home.                        - Resume previous diet.                        -  Continue present medications.                        - Await pathology results.                        - If the pathology report reveals adenomatous tissue,                         then repeat the colonoscopy for surveillance in 5                         years. Procedure Code(s):     --- Professional ---                        [phone], Colonoscopy, flexible; with removal of                         tumor(s), polyp(s), or other lesion(s) by snare                         technique Diagnosis Code(s):     --- Professional ---                        Z86.010,  Personal history of colonic polyps                        K63.5, Polyp of colon CPT copyright 2019 American Medical Association. All rights reserved. The codes documented in this report are preliminary and upon coder review may  be revised to meet current compliance requirements. Lucilla Lame MD, MD 06/25/2021 9:14:46 AM This report has been signed electronically. Number of Addenda: 0 Note Initiated On: 06/25/2021 8:46 AM Scope Withdrawal Time: 0 hours 7 minutes 30 seconds  Total Procedure Duration: 0 hours 14 minutes 34 seconds  Estimated Blood Loss:  Estimated blood loss: none.      [facility]

## 2021-06-25 NOTE — Anesthesia Preprocedure Evaluation (Signed)
Anesthesia Evaluation  Patient identified by MRN, date of birth, ID band Patient awake    History of Anesthesia Complications Negative for: history of anesthetic complications  Airway Mallampati: II  TM Distance: >3 FB Neck ROM: Full    Dental  (+) Edentulous Upper, Edentulous Lower   Pulmonary COPD,  COPD inhaler, Current Smoker (0.5-1 ppd) and Patient abstained from smoking.,    Pulmonary exam normal        Cardiovascular Exercise Tolerance: Good hypertension, Pt. on medications Normal cardiovascular exam     Neuro/Psych negative neurological ROS     GI/Hepatic negative GI ROS, Neg liver ROS,   Endo/Other  negative endocrine ROS  Renal/GU negative Renal ROS     Musculoskeletal negative musculoskeletal ROS (+)   Abdominal   Peds  Hematology negative hematology ROS (+)   Anesthesia Other Findings   Reproductive/Obstetrics                            Anesthesia Physical Anesthesia Plan  ASA: 3  Anesthesia Plan: General   Post-op Pain Management:    Induction: Intravenous  PONV Risk Score and Plan: 2 and TIVA, Propofol infusion and Treatment may vary due to age or medical condition  Airway Management Planned: Nasal Cannula and Natural Airway  Additional Equipment: None  Intra-op Plan:   Post-operative Plan:   Informed Consent: I have reviewed the patients History and Physical, chart, labs and discussed the procedure including the risks, benefits and alternatives for the proposed anesthesia with the patient or authorized representative who has indicated his/her understanding and acceptance.       Plan Discussed with: CRNA  Anesthesia Plan Comments:         Anesthesia Quick Evaluation

## 2021-06-25 NOTE — Transfer of Care (Signed)
Immediate Anesthesia Transfer of Care Note  Patient: Alexandria [name]  Procedure(s) Performed: COLONOSCOPY WITH PROPOFOL (Rectum) POLYPECTOMY (Rectum)  Patient Location: PACU  Anesthesia Type: General  Level of Consciousness: awake, alert  and patient cooperative  Airway and Oxygen Therapy: Patient Spontanous Breathing and Patient connected to supplemental oxygen  Post-op Assessment: Post-op Vital signs reviewed, Patient's Cardiovascular Status Stable, Respiratory Function Stable, Patent Airway and No signs of Nausea or vomiting  Post-op Vital Signs: Reviewed and stable  Complications: No notable events documented.

## 2021-06-25 NOTE — Anesthesia Procedure Notes (Signed)
Date/Time: 06/25/2021 8:54 AM Performed by: Mayme Genta, CRNA Pre-anesthesia Checklist: Patient identified, Emergency Drugs available, Suction available, Timeout performed and Patient being monitored Patient Re-evaluated:Patient Re-evaluated prior to induction Oxygen Delivery Method: Nasal cannula Placement Confirmation: positive ETCO2

## 2021-06-25 NOTE — H&P (Signed)
Alexandria Lame, MD [facility] [address] [place], [place] [address] [phone] Fax : [phone]  Primary Care Physician:  Gwyneth Sprout, FNP Primary Gastroenterologist:  Dr. Allen Norris  Pre-Procedure History & Physical: HPI:  Alexandria [name] is a 65 y.o. female is here for an colonoscopy.   Past Medical History:  Diagnosis Date   Anxiety    Carbuncle and furuncle of trunk    HTN (hypertension)    Hyperlipidemia    Intrinsic asthma, unspecified    Methicillin resistant Staphylococcus aureus in conditions classified elsewhere and of unspecified site    Other psychological or physical stress, not elsewhere classified(V62.89)    Pre-diabetes    Tobacco abuse     Past Surgical History:  Procedure Laterality Date   COLONOSCOPY WITH PROPOFOL N/A 04/10/2015   Procedure: COLONOSCOPY WITH PROPOFOL;  Surgeon: Alexandria Lame, MD;  Location: [place];  Service: Endoscopy;  Laterality: N/A;   POLYPECTOMY  04/10/2015   Procedure: POLYPECTOMY;  Surgeon: Alexandria Lame, MD;  Location: [place];  Service: Endoscopy;;   STATUS POST ENDOMETRIAL ABLATION     TONSILLECTOMY     TUBAL LIGATION     bilateral, sterile ablation   VARICOSE VEINS      Prior to Admission medications   Medication Sig Start Date End Date Taking? Authorizing Provider  albuterol (VENTOLIN HFA) 108 (90 Base) MCG/ACT inhaler USE 2 INHALATIONS BY MOUTH  EVERY 4 HOURS AS NEEDED FOR WHEEZING OR SHORTNESS OF  BREATH 03/30/21  Yes Chrismon, Vickki Muff, PA-C  amLODipine (NORVASC) 10 MG tablet Take 1 tablet (10 mg total) by mouth daily. 04/05/21  Yes Chrismon, Vickki Muff, PA-C  ASPIRIN 81 PO Take by mouth.   Yes [provider]  atorvastatin (LIPITOR) 20 MG tablet Take 1 tablet (20 mg total) by mouth daily. 04/05/21  Yes Chrismon, Vickki Muff, PA-C  cetirizine (ZYRTEC) 10 MG tablet Take 1 tablet (10 mg total) by mouth daily. 01/13/20  Yes Mar Daring, PA-C  clonazePAM (KLONOPIN) 0.5 MG tablet Take 1 tablet  (0.5 mg total) by mouth 2 (two) times daily as needed. 04/05/21  Yes Chrismon, Vickki Muff, PA-C  fluconazole (DIFLUCAN) 150 MG tablet Take 2 tablets, 300 mg, by mouth, once, the first day. Take 1 tablet, 150 mg, by mouth, once, each remaining day until completed. 06/20/21  Yes Tally Joe T, FNP  Fluticasone-Umeclidin-Vilant (TRELEGY ELLIPTA) 100-62.5-25 MCG/INH AEPB INHALE 1 PUFF ONCE DAILY 04/05/21  Yes Chrismon, Vickki Muff, PA-C  hydrochlorothiazide (HYDRODIURIL) 25 MG tablet Take 1 tablet (25 mg total) by mouth daily. 04/05/21  Yes Chrismon, Vickki Muff, PA-C  metFORMIN (GLUCOPHAGE) 500 MG tablet Take 1 tablet (500 mg total) by mouth daily with breakfast. 04/10/21  Yes Chrismon, Vickki Muff, PA-C  MULTIPLE VITAMIN PO Take 1 tablet by mouth daily. Reported on 03/13/2016   Yes [provider]  nystatin (MYCOSTATIN) 100000 UNIT/ML suspension Take 5 mLs (500,000 Units total) by mouth 4 (four) times daily. 05/25/21  Yes Burnette, Anderson Malta M, PA-C  Sod Picosulfate-Mag Ox-Cit Acd (CLENPIQ) 10-3.5-12 MG-GM -GM/160ML SOLN Take 1 kit by mouth as directed. At 5 PM evening before procedure, drink 1 bottle of Clenpiq, hydrate, drink (5) 8 oz of water. Then do the same thing 5 hours prior to your procedure. 04/19/21  Yes Alexandria Lame, MD  fluticasone (FLONASE) 50 MCG/ACT nasal spray Place 2 sprays into both nostrils daily. 12/09/19 01/06/20  Mar Daring, PA-C    Allergies as of 04/19/2021 - Review Complete  04/19/2021  Allergen Reaction Noted   Pravastatin sodium  03/18/2017    Family History  Problem Relation Age of Onset   Coronary artery disease Mother    Hypertension Mother    Rheum arthritis Mother    Heart attack Mother    Lung cancer Father    Emphysema Father    Gout Father    Diabetes Sister    Hypertension Daughter    Hypertension Daughter    Obesity Daughter    Seizures Daughter    Breast cancer Neg Hx    Bladder Cancer Neg Hx    Kidney cancer Neg Hx     Social History   Socioeconomic  History   Marital status: Married    Spouse name: Remo Lipps   Number of children: 3   Years of education: 12   Highest education level: Not on file  Occupational History   Occupation: [employer]    Comment: CNA  Tobacco Use   Smoking status: Every Day    Packs/day: 0.50    Years: 30.00    Pack years: 15.00    Types: Cigarettes    Last attempt to quit: 01/31/2017    Years since quitting: 4.4   Smokeless tobacco: Never  Vaping Use   Vaping Use: Never used  Substance and Sexual Activity   Alcohol use: No    Alcohol/week: 0.0 standard drinks   Drug use: No   Sexual activity: Not on file  Other Topics Concern   Not on file  Social History Narrative   Not on file   Social Determinants of Health   Financial Resource Strain: Not on file  Food Insecurity: Not on file  Transportation Needs: Not on file  Physical Activity: Not on file  Stress: Not on file  Social Connections: Not on file  Intimate Partner Violence: Not on file    Review of Systems: See HPI, otherwise negative ROS  Physical Exam: BP (!) 149/72   Pulse 86   Temp 97.7 F (36.5 C) (Temporal)   Resp 20   Ht 5' 7"  (1.702 m)   Wt 86.6 kg   SpO2 95%   BMI 29.91 kg/m  General:   Alert,  pleasant and cooperative in NAD Head:  Normocephalic and atraumatic. Neck:  Supple; no masses or thyromegaly. Lungs:  Clear throughout to auscultation.    Heart:  Regular rate and rhythm. Abdomen:  Soft, nontender and nondistended. Normal bowel sounds, without guarding, and without rebound.   Neurologic:  Alert and  oriented x4;  grossly normal neurologically.  Impression/Plan: Alexandria [name] is here for an colonoscopy to be performed for a history of adenomatous polyps on 2016   Risks, benefits, limitations, and alternatives regarding  colonoscopy have been reviewed with the patient.  Questions have been answered.  All parties agreeable.   Alexandria Lame, MD  06/25/2021, 8:16 AM

## 2021-06-25 NOTE — Anesthesia Postprocedure Evaluation (Signed)
Anesthesia Post Note  Patient: Alexandria [name]  Procedure(s) Performed: COLONOSCOPY WITH PROPOFOL (Rectum) POLYPECTOMY (Rectum)     Patient location during evaluation: PACU Anesthesia Type: General Level of consciousness: awake and alert Pain management: pain level controlled Vital Signs Assessment: post-procedure vital signs reviewed and stable Respiratory status: spontaneous breathing, nonlabored ventilation, respiratory function stable and patient connected to nasal cannula oxygen Cardiovascular status: blood pressure returned to baseline and stable Postop Assessment: no apparent nausea or vomiting Anesthetic complications: no   No notable events documented.  Adele Barthel Kristiane Morsch

## 2021-06-27 ENCOUNTER — Encounter: Payer: Self-pay | Admitting: Gastroenterology

## 2021-06-27 LAB — SURGICAL PATHOLOGY

## 2021-06-28 ENCOUNTER — Encounter: Payer: Self-pay | Admitting: Gastroenterology

## 2021-07-09 ENCOUNTER — Telehealth: Payer: Self-pay

## 2021-07-09 ENCOUNTER — Telehealth: Payer: Self-pay | Admitting: Family Medicine

## 2021-07-09 NOTE — Telephone Encounter (Signed)
[facility] faxed refill request for the following medications:   Fluticasone-Umeclidin-Vilant (TRELEGY ELLIPTA) 100-62.5-25 MCG/INH AEPB   Please advise.

## 2021-07-10 ENCOUNTER — Other Ambulatory Visit: Payer: Self-pay | Admitting: Family Medicine

## 2021-07-10 MED ORDER — TRELEGY ELLIPTA 100-62.5-25 MCG/ACT IN AEPB: 1.0000 | INHALATION_SPRAY | Freq: Every day | RESPIRATORY_TRACT | 11 refills | Status: DC

## 2021-07-10 NOTE — Telephone Encounter (Signed)
Prescription was sent this morning to [facility]. KW

## 2021-07-17 ENCOUNTER — Encounter: Payer: Self-pay | Admitting: Family Medicine

## 2021-07-17 ENCOUNTER — Ambulatory Visit: Payer: Medicare Other | Admitting: Family Medicine

## 2021-07-17 ENCOUNTER — Other Ambulatory Visit: Payer: Self-pay

## 2021-07-17 VITALS — BP 150/71 | HR 88 | Resp 16 | Wt 196.2 lb

## 2021-07-17 DIAGNOSIS — R140 Abdominal distension (gaseous): Secondary | ICD-10-CM

## 2021-07-17 DIAGNOSIS — B370 Candidal stomatitis: Secondary | ICD-10-CM | POA: Diagnosis not present

## 2021-07-17 DIAGNOSIS — Z23 Encounter for immunization: Secondary | ICD-10-CM

## 2021-07-17 DIAGNOSIS — I10 Essential (primary) hypertension: Secondary | ICD-10-CM | POA: Diagnosis not present

## 2021-07-17 DIAGNOSIS — R7303 Prediabetes: Secondary | ICD-10-CM | POA: Diagnosis not present

## 2021-07-17 DIAGNOSIS — R195 Other fecal abnormalities: Secondary | ICD-10-CM

## 2021-07-17 DIAGNOSIS — Z716 Tobacco abuse counseling: Secondary | ICD-10-CM

## 2021-07-17 LAB — POCT GLYCOSYLATED HEMOGLOBIN (HGB A1C): Hemoglobin A1C: 6.8 % — AB (ref 4.0–5.6)

## 2021-07-17 MED ORDER — METFORMIN HCL ER 750 MG PO TB24: 750.0000 mg | ORAL_TABLET | Freq: Every day | ORAL | 3 refills | Status: DC

## 2021-07-17 MED ORDER — VARENICLINE TARTRATE 0.5 MG X 11 & 1 MG X 42 PO TBPK: ORAL_TABLET | ORAL | 0 refills | Status: DC

## 2021-07-17 MED ORDER — SIMETHICONE 80 MG PO CHEW: 80.0000 mg | CHEWABLE_TABLET | Freq: Four times a day (QID) | ORAL | 0 refills | Status: DC | PRN

## 2021-07-17 NOTE — Assessment & Plan Note (Signed)
Mostly resolved at this time; minor white to edges of tongue Continue to reinforce oral rinse with use of inhalers

## 2021-07-17 NOTE — Assessment & Plan Note (Signed)
Acute elevation; denies concerns Recommend increase water intake Recommend decrease in smoking Pt with active odor of tobacco upon exam

## 2021-07-17 NOTE — Assessment & Plan Note (Signed)
provided ?

## 2021-07-17 NOTE — Progress Notes (Signed)
Established patient visit   Patient: Alexandria [name]   DOB: August 10, 1956   65 y.o. Female  MRN: [id number] Visit Date: 07/17/2021  Today's healthcare provider: Gwyneth Sprout, FNP   Chief Complaint  Patient presents with   Stool Color Change    Patient reports concerns of bloating, belching and black tarry stools, she states that she has had two incidents of this but denies abdominal pain and fever    Hypertension   Hyperlipidemia   Prediabetes   Anxiety   Subjective    HPI HPI     Stool Color Change    Additional comments: Patient reports concerns of bloating, belching and black tarry stools, she states that she has had two incidents of this but denies abdominal pain and fever       Last edited by Minette Headland, CMA on 07/17/2021 10:07 AM.      Prediabetes, Follow-up  Lab Results  Component Value Date   HGBA1C 7.0 (H) 04/05/2021   HGBA1C 6.5 (H) 04/03/2020   HGBA1C 6.3 (H) 04/02/2019   GLUCOSE 132 (H) 04/05/2021   GLUCOSE 87 04/03/2020   GLUCOSE 96 04/02/2019    Last seen for for this3 months ago.  Management since that visit includes none. Current symptoms include none and have been unchanged.  Prior visit with dietician: no Current diet: well balanced Current exercise: no regular exercise  Pertinent Labs:    Component Value Date/Time   CHOL 173 04/05/2021 0956   TRIG 127 04/05/2021 0956   CHOLHDL 4.0 04/05/2021 0956   CREATININE 0.84 04/05/2021 0956   CREATININE 0.53 12/26/2014 0548    Wt Readings from Last 3 Encounters:  07/17/21 196 lb 3.2 oz (89 kg)  06/25/21 191 lb (86.6 kg)  04/05/21 199 lb (90.3 kg)    -----------------------------------------------------------------------------------------  --------------------------------------------------------------------------------------------------- Hypertension, follow-up  BP Readings from Last 3 Encounters:  07/17/21 (!) 150/71  06/25/21 (!) 111/98  04/05/21 (!) 144/87   Wt Readings  from Last 3 Encounters:  07/17/21 196 lb 3.2 oz (89 kg)  06/25/21 191 lb (86.6 kg)  04/05/21 199 lb (90.3 kg)     She was last seen for hypertension 3 months ago.  BP at that visit was 144/87. Management since that visit includes none. She reports excellent compliance with treatment. She is not having side effects.  She is not exercising. She is adherent to low salt diet.   Outside blood pressures are not checked.  She does smoke.  Use of agents associated with hypertension: none.   --------------------------------------------------------------------------------------------------- Lipid/Cholesterol, follow-up  Last Lipid Panel: Lab Results  Component Value Date   CHOL 173 04/05/2021   LDLCALC 107 (H) 04/05/2021   HDL 43 04/05/2021   TRIG 127 04/05/2021    She was last seen for this 3 months ago.  Management since that visit includes none continue Lipitor.  She reports excellent compliance with treatment. She is not having side effects.   Symptoms: No appetite changes No foot ulcerations  No chest pain No chest pressure/discomfort  Yes dyspnea No orthopnea  Yes fatigue No lower extremity edema  No palpitations No paroxysmal nocturnal dyspnea  No nausea No numbness or tingling of extremity  No polydipsia No polyuria  No speech difficulty No syncope   She is following a Regular diet. Current exercise: no regular exercise  Last metabolic panel Lab Results  Component Value Date   GLUCOSE 132 (H) 04/05/2021   NA 140 04/05/2021   K 4.2 04/05/2021  BUN 9 04/05/2021   CREATININE 0.84 04/05/2021   EGFR 77 04/05/2021   GFRNONAA 84 04/03/2020   CALCIUM 10.5 (H) 04/05/2021   AST 22 04/05/2021   ALT 30 04/05/2021   The 10-year ASCVD risk score (Arnett DK, et al., 2019) is: 19.9%  ---------------------------------------------------------------------------------------------------  Anxiety, Follow-up  She was last seen for anxiety 3 months ago. Changes made at  last visit include none.   She reports excellent compliance with treatment. She reports excellent tolerance of treatment. She is not having side effects.   She feels her anxiety is mild and Unchanged since last visit.  Symptoms: No chest pain No difficulty concentrating  No dizziness Yes fatigue  No feelings of losing control No insomnia  No irritable No palpitations  No panic attacks No racing thoughts  No shortness of breath No sweating  No tremors/shakes    GAD-7 Results No flowsheet data found.  PHQ-9 Scores PHQ9 SCORE ONLY 04/05/2021 04/03/2020 04/02/2019  PHQ-9 Total Score 2 0 4    ---------------------------------------------------------------------------------------------------     Medications: Outpatient Medications Prior to Visit  Medication Sig   albuterol (VENTOLIN HFA) 108 (90 Base) MCG/ACT inhaler USE 2 INHALATIONS BY MOUTH  EVERY 4 HOURS AS NEEDED FOR WHEEZING OR SHORTNESS OF  BREATH   amLODipine (NORVASC) 10 MG tablet Take 1 tablet (10 mg total) by mouth daily.   atorvastatin (LIPITOR) 20 MG tablet Take 1 tablet (20 mg total) by mouth daily.   cetirizine (ZYRTEC) 10 MG tablet Take 1 tablet (10 mg total) by mouth daily.   clonazePAM (KLONOPIN) 0.5 MG tablet Take 1 tablet (0.5 mg total) by mouth 2 (two) times daily as needed.   famotidine (PEPCID) 20 MG tablet Take 20 mg by mouth 2 (two) times daily.   fluconazole (DIFLUCAN) 150 MG tablet Take 2 tablets, 300 mg, by mouth, once, the first day. Take 1 tablet, 150 mg, by mouth, once, each remaining day until completed.   Fluticasone-Umeclidin-Vilant (TRELEGY ELLIPTA) 100-62.5-25 MCG/ACT AEPB Inhale 1 puff into the lungs daily.   hydrochlorothiazide (HYDRODIURIL) 25 MG tablet Take 1 tablet (25 mg total) by mouth daily.   MULTIPLE VITAMIN PO Take 1 tablet by mouth daily. Reported on 03/13/2016   nystatin (MYCOSTATIN) 100000 UNIT/ML suspension TAKE 5ML BY MOUTH 4 TIMES DAILY   Sod Picosulfate-Mag Ox-Cit Acd (CLENPIQ)  10-3.5-12 MG-GM -GM/160ML SOLN Take 1 kit by mouth as directed. At 5 PM evening before procedure, drink 1 bottle of Clenpiq, hydrate, drink (5) 8 oz of water. Then do the same thing 5 hours prior to your procedure.   [DISCONTINUED] ASPIRIN 81 PO Take by mouth.   [DISCONTINUED] metFORMIN (GLUCOPHAGE) 500 MG tablet Take 1 tablet (500 mg total) by mouth daily with breakfast.   No facility-administered medications prior to visit.    Review of Systems     Objective    BP (!) 150/71   Pulse 88   Resp 16   Wt 196 lb 3.2 oz (89 kg)   SpO2 97%   BMI 30.73 kg/m    Physical Exam Vitals and nursing note reviewed.  Constitutional:      General: She is not in acute distress.    Appearance: Normal appearance. She is obese. She is not ill-appearing, toxic-appearing or diaphoretic.  HENT:     Head: Normocephalic and atraumatic.  Cardiovascular:     Rate and Rhythm: Normal rate and regular rhythm.     Pulses: Normal pulses.     Heart sounds: Normal heart sounds. No  murmur heard.   No friction rub. No gallop.  Pulmonary:     Effort: Pulmonary effort is normal. No respiratory distress.     Breath sounds: Normal breath sounds. No stridor. No wheezing, rhonchi or rales.  Chest:     Chest wall: No tenderness.  Abdominal:     General: Bowel sounds are normal.     Palpations: Abdomen is soft.  Musculoskeletal:        General: No swelling, tenderness, deformity or signs of injury. Normal range of motion.     Right lower leg: No edema.     Left lower leg: No edema.  Skin:    General: Skin is warm and dry.     Capillary Refill: Capillary refill takes less than 2 seconds.     Coloration: Skin is not jaundiced or pale.     Findings: No bruising, erythema, lesion or rash.  Neurological:     General: No focal deficit present.     Mental Status: She is alert and oriented to person, place, and time. Mental status is at baseline.     Cranial Nerves: No cranial nerve deficit.     Sensory: No  sensory deficit.     Motor: No weakness.     Coordination: Coordination normal.  Psychiatric:        Mood and Affect: Mood normal.        Behavior: Behavior normal.        Thought Content: Thought content normal.        Judgment: Judgment normal.     No results found for any visits on 07/17/21.  Assessment & Plan     Problem List Items Addressed This Visit       Cardiovascular and Mediastinum   HTN (hypertension)    Acute elevation; denies concerns Recommend increase water intake Recommend decrease in smoking Pt with active odor of tobacco upon exam        Digestive   Thrush    Mostly resolved at this time; minor white to edges of tongue Continue to reinforce oral rinse with use of inhalers         Other   Bloating    C/o bloating, belching Acute concerns Recommend bland diet and use of prn medications to assist with symptoms       Relevant Medications   simethicone (MYLICON) 80 MG chewable tablet   Prediabetes - Primary    Stable Refill of metformin- increase dose Pt has been taking at night to assist with metallic taste      Relevant Medications   metFORMIN (GLUCOPHAGE-XR) 750 MG 24 hr tablet   Other Relevant Orders   POCT glycosylated hemoglobin (Hb A1C)   Need for influenza vaccination    provided      Relevant Orders   Flu Vaccine QUAD High Dose(Fluad) (Completed)   Encounter for smoking cessation counseling    Trial of chantix Has tried patches, gums, wellbutrin, counseling, etc Advised will assist with htn and dm control as well as other overall health concerns over time      Relevant Medications   varenicline (CHANTIX PAK) 0.5 MG X 11 & 1 MG X 42 tablet   Change in stool    Two, separate episodes of change in stool habits Recommend hold ASA at this time to continue to monitor stool patterns/consistency/color Patient recently underwent colon and denies hemorrhoids refused exam encouraged to report back on change- also taking OTC pepcid  Return in about 3 months (around 10/17/2021), or if symptoms worsen or fail to improve, for T2DM management, anxiety and depression, chonic disease management.      Vonna Kotyk, FNP, have reviewed all documentation for this visit. The documentation on 07/17/21 for the exam, diagnosis, procedures, and orders are all accurate and complete.    Gwyneth Sprout, [place] [phone] (phone) [phone] (fax)  [place]

## 2021-07-17 NOTE — Assessment & Plan Note (Signed)
Two, separate episodes of change in stool habits Recommend hold ASA at this time to continue to monitor stool patterns/consistency/color Patient recently underwent colon and denies hemorrhoids refused exam encouraged to report back on change- also taking OTC pepcid

## 2021-07-17 NOTE — Assessment & Plan Note (Signed)
C/o bloating, belching Acute concerns Recommend bland diet and use of prn medications to assist with symptoms

## 2021-07-17 NOTE — Assessment & Plan Note (Signed)
Trial of chantix Has tried patches, gums, wellbutrin, counseling, etc Advised will assist with htn and dm control as well as other overall health concerns over time

## 2021-07-17 NOTE — Assessment & Plan Note (Signed)
Stable Refill of metformin- increase dose Pt has been taking at night to assist with metallic taste

## 2021-07-19 ENCOUNTER — Telehealth: Payer: Self-pay

## 2021-07-19 NOTE — Telephone Encounter (Signed)
Copied from [place] [phone]. Topic: General - Call Back - No Documentation >> Jul 19, 2021 10:34 AM Erick Blinks wrote: Reason for CRM: Requesting a call back from [place], has questions regarding her simethicone (MYLICON) 80 MG chewable tablet.   Best contact: [phone]

## 2021-07-20 ENCOUNTER — Other Ambulatory Visit: Payer: Self-pay | Admitting: Family Medicine

## 2021-07-20 DIAGNOSIS — Z1231 Encounter for screening mammogram for malignant neoplasm of breast: Secondary | ICD-10-CM

## 2021-07-20 NOTE — Telephone Encounter (Signed)
Spoke with patient and advised her of prescription. KW

## 2021-07-31 ENCOUNTER — Telehealth: Payer: Self-pay | Admitting: Family Medicine

## 2021-07-31 NOTE — Telephone Encounter (Signed)
[facility] faxed refill request for the following medications:   Fluticasone-Umeclidin-Vilant (TRELEGY ELLIPTA) 100-62.5-25 MCG/ACT AEPB   Please advise.

## 2021-07-31 NOTE — Telephone Encounter (Signed)
The following medication records are no longer available for ordering. Place a new order with a different medication record.  Fluticasone-Umeclidin-Vilant (TRELEGY ELLIPTA) 100-62.5-25 MCG/INH AEPB   Please advise  LOV 04/05/21  NOV 07/17/21  LRF 04/05/21

## 2021-07-31 NOTE — Telephone Encounter (Signed)
[facility] faxed refill request for the following medications:  clonazePAM (KLONOPIN) 0.5 MG tablet   Please advise.

## 2021-08-01 NOTE — Telephone Encounter (Signed)
Trellegy has been filled disregard request, for Clonazepam prescription was last filled 04/05/21, patient last seen in office 07/17/21, please review. KW

## 2021-08-02 ENCOUNTER — Other Ambulatory Visit: Payer: Self-pay | Admitting: Family Medicine

## 2021-08-02 DIAGNOSIS — F419 Anxiety disorder, unspecified: Secondary | ICD-10-CM

## 2021-08-02 MED ORDER — CLONAZEPAM 0.5 MG PO TABS: ORAL_TABLET | ORAL | 0 refills | Status: DC

## 2021-08-06 ENCOUNTER — Ambulatory Visit: Payer: Self-pay | Admitting: *Deleted

## 2021-08-06 NOTE — Telephone Encounter (Signed)
Reason for Disposition  [1] Abnormal color is unexplained AND [2] persists > 24 hours  Answer Assessment - Initial Assessment Questions 1. COLOR: "What color is it?" "Is that color in part or all of the stool?"     Pt calling in c/o having tarry stools yesterday.  She had a colonoscopy in August and been having tarry stools since then and this has been going on since that .    [name] calling in.   She had tarry stools.     2. ONSET: "When was the unusual color first noted?"     Black, tarry stools.   It takes every thing out of her.   She stayed in bed all day yesterday.  Going to bathroom frequently  yesterday with diarrhea.    C/O and pain in middle of her stomach.   No vomiting.  The black, tarry stools are getting worse over time. Daneil Dan told her that her diverticulars could be acting up.   They told her she had diverticulitis when she had the colonoscopy.  C/o dizziness occasionally.    Weak and stayed in bed all day yesterday.    Only ate corn flakes yesterday.    No fever.    Yesterday I did have chills. 3. CAUSE: "Have you eaten any food or taken any medicine of this color?" (See listing in BACKGROUND)     No 4. OTHER SYMPTOMS: "Do you have any other symptoms?" (e.g., diarrhea, jaundice, abdominal pain, fever).     Abd pain in middle of stomach.   Black, tarry stools getting worse.  Protocols used: Stools - Unusual Color-A-AH

## 2021-08-06 NOTE — Telephone Encounter (Signed)
Pt's [name] called in but pt there with her in background answering questions.   She is c/o having dark, tarry stools since having a colonoscopy in August.   They are happening more often over time.   Yesterday she stayed in bed most of the day and was having diarrhea and chills along with the black, tarry stools.   She's also c/o abd pain in the middle of her stomach.     Pt has to work out transportation issues with her insurance co that she gets transportation through.so requires a 3 day notice.   She can be reached at [phone].  I have sent a note to [facility] high priority to see if they can get her in sooner.   Tally Joe is who she sees and pt says Daneil Dan is aware she is having this problem.    They told her after the colonoscopy she has diverticulitis.    Message sent to [facility]

## 2021-08-06 NOTE — Telephone Encounter (Signed)
Appt 08/09/21 1020

## 2021-08-09 ENCOUNTER — Ambulatory Visit (INDEPENDENT_AMBULATORY_CARE_PROVIDER_SITE_OTHER): Payer: Medicare Other | Admitting: Family Medicine

## 2021-08-09 ENCOUNTER — Encounter: Payer: Self-pay | Admitting: Family Medicine

## 2021-08-09 ENCOUNTER — Other Ambulatory Visit: Payer: Self-pay

## 2021-08-09 ENCOUNTER — Ambulatory Visit
Admission: RE | Admit: 2021-08-09 | Discharge: 2021-08-09 | Disposition: A | Discharge status: Home / Self Care | Payer: Medicare Other | Source: Ambulatory Visit | Attending: Family Medicine | Admitting: Family Medicine

## 2021-08-09 ENCOUNTER — Ambulatory Visit
Admission: RE | Admit: 2021-08-09 | Discharge: 2021-08-09 | Disposition: A | Discharge status: Home / Self Care | Payer: Medicare Other | Attending: Family Medicine | Admitting: Family Medicine

## 2021-08-09 VITALS — BP 138/72 | HR 82 | Temp 97.6°F | Resp 16 | Wt 192.2 lb

## 2021-08-09 DIAGNOSIS — R195 Other fecal abnormalities: Secondary | ICD-10-CM | POA: Diagnosis not present

## 2021-08-09 DIAGNOSIS — R197 Diarrhea, unspecified: Secondary | ICD-10-CM | POA: Diagnosis not present

## 2021-08-09 DIAGNOSIS — R42 Dizziness and giddiness: Secondary | ICD-10-CM | POA: Diagnosis not present

## 2021-08-09 DIAGNOSIS — R109 Unspecified abdominal pain: Secondary | ICD-10-CM | POA: Diagnosis not present

## 2021-08-09 LAB — HEMOCCULT GUIAC POC 1CARD (OFFICE): Fecal Occult Blood, POC: NEGATIVE

## 2021-08-09 MED ORDER — LOPERAMIDE HCL 2 MG PO CAPS: 2.0000 mg | ORAL_CAPSULE | Freq: Four times a day (QID) | ORAL | 1 refills | Status: DC | PRN

## 2021-08-09 NOTE — Assessment & Plan Note (Signed)
Black, tarry, smelly stools

## 2021-08-09 NOTE — Assessment & Plan Note (Signed)
Labs today Slight vision disturbance x1 Bps stable

## 2021-08-09 NOTE — Progress Notes (Signed)
Established patient visit   Patient: Alexandria [name]   DOB: 1956/04/03   65 y.o. Female  MRN: [id number] Visit Date: 08/09/2021  Today's healthcare provider: Gwyneth Sprout, FNP   Chief Complaint  Patient presents with   Diarrhea   Subjective    Diarrhea  This is a new problem. The current episode started in the past 7 days. The stool consistency is described as Mucous (patient describes stools as black in color). The patient states that diarrhea does not awaken her from sleep. Associated symptoms include abdominal pain, chills and headaches. Pertinent negatives include no arthralgias, bloating, coughing, fever, increased  flatus, myalgias, sweats, URI, vomiting or weight loss. She has tried increased fluids and electrolyte solution for the symptoms. The treatment provided mild relief.     Medications: Outpatient Medications Prior to Visit  Medication Sig   albuterol (VENTOLIN HFA) 108 (90 Base) MCG/ACT inhaler USE 2 INHALATIONS BY MOUTH  EVERY 4 HOURS AS NEEDED FOR WHEEZING OR SHORTNESS OF  BREATH   amLODipine (NORVASC) 10 MG tablet Take 1 tablet (10 mg total) by mouth daily.   atorvastatin (LIPITOR) 20 MG tablet Take 1 tablet (20 mg total) by mouth daily.   cetirizine (ZYRTEC) 10 MG tablet Take 1 tablet (10 mg total) by mouth daily.   clonazePAM (KLONOPIN) 0.5 MG tablet Take medication daily; may repeat dose if needed for max of 2 tablets in 24 hours.   famotidine (PEPCID) 20 MG tablet Take 20 mg by mouth 2 (two) times daily.   fluconazole (DIFLUCAN) 150 MG tablet Take 2 tablets, 300 mg, by mouth, once, the first day. Take 1 tablet, 150 mg, by mouth, once, each remaining day until completed.   Fluticasone-Umeclidin-Vilant (TRELEGY ELLIPTA) 100-62.5-25 MCG/ACT AEPB Inhale 1 puff into the lungs daily.   hydrochlorothiazide (HYDRODIURIL) 25 MG tablet Take 1 tablet (25 mg total) by mouth daily.   MULTIPLE VITAMIN PO Take 1 tablet by mouth daily. Reported on 03/13/2016   nystatin  (MYCOSTATIN) 100000 UNIT/ML suspension TAKE 5ML BY MOUTH 4 TIMES DAILY   simethicone (MYLICON) 80 MG chewable tablet Chew 1 tablet (80 mg total) by mouth every 6 (six) hours as needed for flatulence.   Sod Picosulfate-Mag Ox-Cit Acd (CLENPIQ) 10-3.5-12 MG-GM -GM/160ML SOLN Take 1 kit by mouth as directed. At 5 PM evening before procedure, drink 1 bottle of Clenpiq, hydrate, drink (5) 8 oz of water. Then do the same thing 5 hours prior to your procedure.   varenicline (CHANTIX PAK) 0.5 MG X 11 & 1 MG X 42 tablet Take one 0.5 mg tablet by mouth once daily for 3 days, then increase to one 0.5 mg tablet twice daily for 4 days, then increase to one 1 mg tablet twice daily.   [DISCONTINUED] metFORMIN (GLUCOPHAGE-XR) 750 MG 24 hr tablet Take 1 tablet (750 mg total) by mouth daily with breakfast.   No facility-administered medications prior to visit.    Review of Systems  Constitutional:  Positive for chills. Negative for fever and weight loss.  Respiratory:  Negative for cough.   Gastrointestinal:  Positive for abdominal pain and diarrhea. Negative for bloating, flatus and vomiting.  Musculoskeletal:  Negative for arthralgias and myalgias.  Neurological:  Positive for headaches.      Objective    BP 138/72   Pulse 82   Temp 97.6 F (36.4 C) (Oral)   Resp 16   Wt 192 lb 3.2 oz (87.2 kg)   SpO2 98%   BMI  30.10 kg/m    Physical Exam Vitals and nursing note reviewed.  Constitutional:      General: She is not in acute distress.    Appearance: Normal appearance. She is obese. She is not ill-appearing, toxic-appearing or diaphoretic.  HENT:     Head: Normocephalic and atraumatic.  Cardiovascular:     Rate and Rhythm: Normal rate and regular rhythm.     Pulses: Normal pulses.     Heart sounds: Normal heart sounds. No murmur heard.   No friction rub. No gallop.  Pulmonary:     Effort: Pulmonary effort is normal. No respiratory distress.     Breath sounds: Normal breath sounds. No stridor.  No wheezing, rhonchi or rales.  Chest:     Chest wall: No tenderness.  Abdominal:     General: Bowel sounds are normal.     Palpations: Abdomen is soft. There is no mass.     Tenderness: There is abdominal tenderness. There is no right CVA tenderness, left CVA tenderness or guarding.  Musculoskeletal:        General: No swelling, tenderness, deformity or signs of injury. Normal range of motion.     Right lower leg: No edema.     Left lower leg: No edema.  Skin:    General: Skin is warm and dry.     Capillary Refill: Capillary refill takes less than 2 seconds.     Coloration: Skin is not jaundiced or pale.     Findings: No bruising, erythema, lesion or rash.  Neurological:     General: No focal deficit present.     Mental Status: She is alert and oriented to person, place, and time. Mental status is at baseline.     Cranial Nerves: No cranial nerve deficit.     Sensory: No sensory deficit.     Motor: No weakness.     Coordination: Coordination normal.  Psychiatric:        Mood and Affect: Mood normal.        Behavior: Behavior normal.        Thought Content: Thought content normal.        Judgment: Judgment normal.     Results for orders placed or performed in visit on 08/09/21  POCT Occult Blood Stool  Result Value Ref Range   Fecal Occult Blood, POC Negative Negative   Card #1 Date     Card #2 Fecal Occult Blod, POC     Card #2 Date     Card #3 Fecal Occult Blood, POC     Card #3 Date      Assessment & Plan     Problem List Items Addressed This Visit       Other   Change in stool - Primary    Negative guaiac  Reports change in stool consistency, smell, color      Relevant Orders   Ambulatory referral to Gastroenterology   DG Abd 2 Views   POCT Occult Blood Stool (Completed)   Diarrhea    Black, tarry, smelly stools      Relevant Medications   loperamide (IMODIUM) 2 MG capsule   Other Relevant Orders   Ambulatory referral to Gastroenterology   DG Abd 2  Views   CBC with Differential/Platelet   Dizziness    Labs today Slight vision disturbance x1 Bps stable      Relevant Orders   CBC with Differential/Platelet     Return if symptoms worsen or fail to improve.  Vonna Kotyk, FNP, have reviewed all documentation for this visit. The documentation on 08/09/21 for the exam, diagnosis, procedures, and orders are all accurate and complete.    Gwyneth Sprout, [place] [phone] (phone) [phone] (fax)  [place]

## 2021-08-09 NOTE — Assessment & Plan Note (Signed)
Negative guaiac  Reports change in stool consistency, smell, color

## 2021-08-10 LAB — CBC WITH DIFFERENTIAL/PLATELET
Basophils Absolute: 0.1 10*3/uL (ref 0.0–0.2)
Basos: 1 %
EOS (ABSOLUTE): 0.2 10*3/uL (ref 0.0–0.4)
Eos: 2 %
Hematocrit: 45.7 % (ref 34.0–46.6)
Hemoglobin: 15.5 g/dL (ref 11.1–15.9)
Immature Grans (Abs): 0 10*3/uL (ref 0.0–0.1)
Immature Granulocytes: 0 %
Lymphocytes Absolute: 3.8 10*3/uL — ABNORMAL HIGH (ref 0.7–3.1)
Lymphs: 38 %
MCH: 29 pg (ref 26.6–33.0)
MCHC: 33.9 g/dL (ref 31.5–35.7)
MCV: 85 fL (ref 79–97)
Monocytes Absolute: 0.7 10*3/uL (ref 0.1–0.9)
Monocytes: 7 %
Neutrophils Absolute: 5.2 10*3/uL (ref 1.4–7.0)
Neutrophils: 52 %
Platelets: 338 10*3/uL (ref 150–450)
RBC: 5.35 x10E6/uL — ABNORMAL HIGH (ref 3.77–5.28)
RDW: 13.1 % (ref 11.7–15.4)
WBC: 10 10*3/uL (ref 3.4–10.8)

## 2021-08-15 ENCOUNTER — Other Ambulatory Visit: Payer: Self-pay

## 2021-08-23 ENCOUNTER — Telehealth: Payer: Self-pay | Admitting: Family Medicine

## 2021-08-23 NOTE — Telephone Encounter (Signed)
[facility] faxed refill request for the following medications:  Fluticasone-Umeclidin-Vilant (TRELEGY ELLIPTA) 100-62.5-25 MCG/ACT AEPB   Please advise.

## 2021-08-23 NOTE — Telephone Encounter (Signed)
Last prescribed 07/10/21 with 11 refills will decline refill at this time. KW

## 2021-08-28 ENCOUNTER — Telehealth: Payer: Self-pay | Admitting: Family Medicine

## 2021-08-28 ENCOUNTER — Other Ambulatory Visit: Payer: Self-pay

## 2021-08-28 DIAGNOSIS — F419 Anxiety disorder, unspecified: Secondary | ICD-10-CM

## 2021-08-28 NOTE — Telephone Encounter (Signed)
[facility] faxed refill request for the following medications:  Fluticasone-Umeclidin-Vilant (TRELEGY ELLIPTA) 100-62.5-25 MCG/ACT AEPB   Please advise.

## 2021-08-28 NOTE — Telephone Encounter (Signed)
[facility] faxed refill request for the following medications:  clonazePAM (KLONOPIN) 0.5 MG tablet   Please advise.

## 2021-08-30 MED ORDER — CLONAZEPAM 0.5 MG PO TABS: ORAL_TABLET | ORAL | 0 refills | Status: DC

## 2021-09-04 ENCOUNTER — Telehealth: Payer: Self-pay

## 2021-09-04 NOTE — Telephone Encounter (Signed)
Copied from [employer] [id number]. Topic: Quick Communication - Rx Refill/Question >> Sep 04, 2021  2:14 PM Pawlus, Brayton Layman A wrote: Pt called in to let Tally Joe know she wants all her medications sent to [facility] ([place], [facility] [address] [place] [address] [place] [address] Phone: [phone] Fax: [phone]  Pt will no longer be using [facility].

## 2021-09-05 NOTE — Telephone Encounter (Signed)
FYI. KW 

## 2021-09-05 NOTE — Telephone Encounter (Signed)
Clarified with  patient no refills are needed at this time just for future refills on out [facility] is preferred pharmacy. KW

## 2021-09-17 NOTE — Telephone Encounter (Signed)
Patient called in, thinks she needs refills on Trilogy

## 2021-09-18 ENCOUNTER — Telehealth: Payer: Self-pay

## 2021-09-18 NOTE — Telephone Encounter (Signed)
[facility] faxed refill request for the following medications:  clonazePAM (KLONOPIN) 0.5 MG tablet   Please advise.

## 2021-09-18 NOTE — Telephone Encounter (Signed)
Please review, prescription was last filled 08/30/21. KW

## 2021-09-19 ENCOUNTER — Other Ambulatory Visit: Payer: Self-pay | Admitting: Family Medicine

## 2021-09-19 DIAGNOSIS — F419 Anxiety disorder, unspecified: Secondary | ICD-10-CM

## 2021-09-19 MED ORDER — CLONAZEPAM 0.5 MG PO TABS: ORAL_TABLET | ORAL | 2 refills | Status: DC

## 2021-09-20 ENCOUNTER — Ambulatory Visit: Payer: Medicare Other

## 2021-09-24 ENCOUNTER — Telehealth: Payer: Self-pay | Admitting: Family Medicine

## 2021-09-24 ENCOUNTER — Ambulatory Visit: Payer: Medicare Other | Admitting: Gastroenterology

## 2021-09-24 NOTE — Telephone Encounter (Signed)
Last filled 07/30/21 with 11 refills. Disregard request. Alexandria [name]

## 2021-09-24 NOTE — Telephone Encounter (Signed)
[facility] faxed refill request for the following medications:  Fluticasone-Umeclidin-Vilant (TRELEGY ELLIPTA) 100-62.5-25 MCG/ACT AEPB   Please advise.

## 2021-09-27 ENCOUNTER — Telehealth: Payer: Self-pay

## 2021-09-27 MED ORDER — TRELEGY ELLIPTA 100-62.5-25 MCG/ACT IN AEPB: 1.0000 | INHALATION_SPRAY | Freq: Every day | RESPIRATORY_TRACT | 10 refills | Status: DC

## 2021-09-27 NOTE — Addendum Note (Signed)
Addended by: Minette Headland on: 09/27/2021 02:02 PM   Modules accepted: Orders

## 2021-09-27 NOTE — Telephone Encounter (Signed)
Copied from [place] [phone]. Topic: Quick Communication - Rx Refill/Question >> Sep 04, 2021  2:14 PM Pawlus, Brayton Layman A wrote: Pt called in to let Tally Joe know she wants all her medications sent to [facility] ([place], [facility] [address] [place] [address] [place] [address] Phone: [phone] Fax: [phone]  Pt will no longer be using [facility]. >> Sep 27, 2021 11:42 AM Greggory Keen D wrote: Pt called saying she needs her Trelogy and it needs to be sent to [facility].  She needs this sent to [facility] asap.  She only has a few days left.  She ask Nunzio Cory to call her back at [phone]

## 2021-09-27 NOTE — Telephone Encounter (Signed)
Per patient she can no longer use [facility] and request prescriptions from this day forward be sent to [facility], she requested for her inhaler refills be sent to [facility] instead of [facility]. KW

## 2021-09-27 NOTE — Telephone Encounter (Signed)
Copied from [place] [id number]. Topic: Quick Communication - Rx Refill/Question >> Sep 04, 2021  2:14 PM Pawlus, Brayton Layman A wrote: Pt called in to let Tally Joe know she wants all her medications sent to [facility] ([place], [place] [facility] [address] [place] [address] [place] [address] Phone: [phone] Fax: [phone]  Pt will no longer be using [facility]. >> Sep 27, 2021 11:42 AM Greggory Keen D wrote: Pt called saying she needs her Trelogy and it needs to be sent to [facility].  She needs this sent to [facility] asap.  She only has a few days left.  She ask Nunzio Cory to call her back at [phone]

## 2021-10-29 ENCOUNTER — Telehealth: Payer: Self-pay

## 2021-10-29 ENCOUNTER — Other Ambulatory Visit: Payer: Self-pay

## 2021-10-29 DIAGNOSIS — Z1211 Encounter for screening for malignant neoplasm of colon: Secondary | ICD-10-CM

## 2021-10-29 MED ORDER — CLENPIQ 10-3.5-12 MG-GM -GM/160ML PO SOLN: 1.0000 | Freq: Once | ORAL | 0 refills | Status: DC

## 2021-10-29 NOTE — Progress Notes (Signed)
Order cancelled

## 2021-10-29 NOTE — Telephone Encounter (Signed)
Patient contacted [facility] to reschedule her procedure for 11/12/2021 with Dr. Allen Norris. Instructions and prep will be sent.

## 2021-11-21 ENCOUNTER — Other Ambulatory Visit: Payer: Self-pay

## 2021-11-21 ENCOUNTER — Ambulatory Visit (INDEPENDENT_AMBULATORY_CARE_PROVIDER_SITE_OTHER): Payer: Medicare Other | Admitting: Gastroenterology

## 2021-11-21 VITALS — BP 138/77 | HR 102 | Temp 97.9°F | Wt 190.0 lb

## 2021-11-21 DIAGNOSIS — K921 Melena: Secondary | ICD-10-CM

## 2021-11-21 DIAGNOSIS — R109 Unspecified abdominal pain: Secondary | ICD-10-CM

## 2021-11-21 NOTE — Progress Notes (Signed)
? ? ?Primary Care Physician: Gwyneth Sprout, FNP ? ?Primary Gastroenterologist:  Dr. Lucilla Lame ? ?Chief Complaint  ?Patient presents with  ? New Patient (Initial Visit)  ? Abdominal Pain  ?  Black tarry stools  ? ? ?HPI: Alexandria [name] is a 66 y.o. female here after seeing me in the past in 2022 for a colonoscopy due to a history of colon polyps.  The patient was recommended at that time to have a repeat colonoscopy in 5 years.  The patient's polyp was a sessile serrated polyp.  The patient then had seen her primary care provider back in October reporting diarrhea with passage of mucus.  The patient was reporting black tarry stools but her Hemoccult was negative as reported by her PCP. ? ?The patient also reports that she has some pain in the left side of her abdomen.  There is no report of any unexplained weight loss nausea vomiting fevers or chills. ? ?Past Medical History:  ?Diagnosis Date  ? Anxiety   ? Carbuncle and furuncle of trunk   ? HTN (hypertension)   ? Hyperlipidemia   ? Intrinsic asthma, unspecified   ? Methicillin resistant Staphylococcus aureus in conditions classified elsewhere and of unspecified site   ? Other psychological or physical stress, not elsewhere classified(V62.89)   ? Pre-diabetes   ? Tobacco abuse   ? ? ?Current Outpatient Medications  ?Medication Sig Dispense Refill  ? albuterol (VENTOLIN HFA) 108 (90 Base) MCG/ACT inhaler USE 2 INHALATIONS BY MOUTH  EVERY 4 HOURS AS NEEDED FOR WHEEZING OR SHORTNESS OF  BREATH 40.2 g 3  ? amLODipine (NORVASC) 10 MG tablet Take 1 tablet (10 mg total) by mouth daily. 90 tablet 3  ? atorvastatin (LIPITOR) 20 MG tablet Take 1 tablet (20 mg total) by mouth daily. 90 tablet 3  ? cetirizine (ZYRTEC) 10 MG tablet Take 1 tablet (10 mg total) by mouth daily. 30 tablet 11  ? clonazePAM (KLONOPIN) 0.5 MG tablet Take medication daily; may repeat dose if needed for max of 2 tablets in 24 hours. 45 tablet 2  ? famotidine (PEPCID) 20 MG tablet Take 20 mg by mouth 2  (two) times daily.    ? fluconazole (DIFLUCAN) 150 MG tablet Take 2 tablets, 300 mg, by mouth, once, the first day. Take 1 tablet, 150 mg, by mouth, once, each remaining day until completed. 9 tablet 0  ? Fluticasone-Umeclidin-Vilant (TRELEGY ELLIPTA) 100-62.5-25 MCG/ACT AEPB Inhale 1 puff into the lungs daily. 1 each 10  ? hydrochlorothiazide (HYDRODIURIL) 25 MG tablet Take 1 tablet (25 mg total) by mouth daily. 90 tablet 3  ? loperamide (IMODIUM) 2 MG capsule Take 1 capsule (2 mg total) by mouth 4 (four) times daily as needed for diarrhea or loose stools. 30 capsule 1  ? MULTIPLE VITAMIN PO Take 1 tablet by mouth daily. Reported on 03/13/2016    ? nystatin (MYCOSTATIN) 100000 UNIT/ML suspension TAKE 5ML BY MOUTH 4 TIMES DAILY 200 mL 0  ? simethicone (MYLICON) 80 MG chewable tablet Chew 1 tablet (80 mg total) by mouth every 6 (six) hours as needed for flatulence. 30 tablet 0  ? ?No current facility-administered medications for this visit.  ? ? ?Allergies as of 11/21/2021 - Review Complete 11/21/2021  ?Allergen Reaction Noted  ? Pravastatin sodium  03/18/2017  ? ? ?ROS: ? ?General: Negative for anorexia, weight loss, fever, chills, fatigue, weakness. ?ENT: Negative for hoarseness, difficulty swallowing , nasal congestion. ?CV: Negative for chest pain, angina, palpitations, dyspnea on  exertion, peripheral edema.  ?Respiratory: Negative for dyspnea at rest, dyspnea on exertion, cough, sputum, wheezing.  ?GI: See history of present illness. ?GU:  Negative for dysuria, hematuria, urinary incontinence, urinary frequency, nocturnal urination.  ?Endo: Negative for unusual weight change.  ?  ?Physical Examination: ? ? There were no vitals taken for this visit. ? ?General: Well-nourished, well-developed in no acute distress.  ?Eyes: No icterus. Conjunctivae pink. ?Lungs: Clear to auscultation bilaterally. Non-labored. ?Heart: Regular rate and rhythm, no murmurs rubs or gallops.  ?Abdomen: Bowel sounds are normal, nontender,  nondistended, no hepatosplenomegaly or masses, no abdominal bruits or hernia , no rebound or guarding.   ?Extremities: No lower extremity edema. No clubbing or deformities. ?Neuro: Alert and oriented x 3.  Grossly intact. ?Skin: Warm and dry, no jaundice.   ?Psych: Alert and cooperative, normal mood and affect. ? ?Labs:  ?  ?Imaging Studies: ?No results found. ? ?Assessment and Plan:  ? ?[name] is a 66 y.o. y/o female who comes in today with a report of black stools and left-sided abdominal pain.  The patient had a colonoscopy with a precancerous polyp found.  The patient will be set up for a blood test to check for her blood count to make sure she is not anemic and she will also be set up for an upper endoscopy to look for any source of the cause of her black stools.  The patient has been explained the plan and agrees with it ? ? ? ? ?Lucilla Lame, MD. Marval Regal ? ? ? Note: This dictation was prepared with Dragon dictation along with smaller phrase technology. Any transcriptional errors that result from this process are unintentional.  ?

## 2021-11-21 NOTE — H&P (View-Only) (Signed)
? ? ?Primary Care Physician: Gwyneth Sprout, FNP ? ?Primary Gastroenterologist:  Dr. Lucilla Lame ? ?Chief Complaint  ?Patient presents with  ? New Patient (Initial Visit)  ? Abdominal Pain  ?  Black tarry stools  ? ? ?HPI: Alexandria [name] is a 66 y.o. female here after seeing me in the past in 2022 for a colonoscopy due to a history of colon polyps.  The patient was recommended at that time to have a repeat colonoscopy in 5 years.  The patient's polyp was a sessile serrated polyp.  The patient then had seen her primary care provider back in October reporting diarrhea with passage of mucus.  The patient was reporting black tarry stools but her Hemoccult was negative as reported by her PCP. ? ?The patient also reports that she has some pain in the left side of her abdomen.  There is no report of any unexplained weight loss nausea vomiting fevers or chills. ? ?Past Medical History:  ?Diagnosis Date  ? Anxiety   ? Carbuncle and furuncle of trunk   ? HTN (hypertension)   ? Hyperlipidemia   ? Intrinsic asthma, unspecified   ? Methicillin resistant Staphylococcus aureus in conditions classified elsewhere and of unspecified site   ? Other psychological or physical stress, not elsewhere classified(V62.89)   ? Pre-diabetes   ? Tobacco abuse   ? ? ?Current Outpatient Medications  ?Medication Sig Dispense Refill  ? albuterol (VENTOLIN HFA) 108 (90 Base) MCG/ACT inhaler USE 2 INHALATIONS BY MOUTH  EVERY 4 HOURS AS NEEDED FOR WHEEZING OR SHORTNESS OF  BREATH 40.2 g 3  ? amLODipine (NORVASC) 10 MG tablet Take 1 tablet (10 mg total) by mouth daily. 90 tablet 3  ? atorvastatin (LIPITOR) 20 MG tablet Take 1 tablet (20 mg total) by mouth daily. 90 tablet 3  ? cetirizine (ZYRTEC) 10 MG tablet Take 1 tablet (10 mg total) by mouth daily. 30 tablet 11  ? clonazePAM (KLONOPIN) 0.5 MG tablet Take medication daily; may repeat dose if needed for max of 2 tablets in 24 hours. 45 tablet 2  ? famotidine (PEPCID) 20 MG tablet Take 20 mg by mouth 2  (two) times daily.    ? fluconazole (DIFLUCAN) 150 MG tablet Take 2 tablets, 300 mg, by mouth, once, the first day. Take 1 tablet, 150 mg, by mouth, once, each remaining day until completed. 9 tablet 0  ? Fluticasone-Umeclidin-Vilant (TRELEGY ELLIPTA) 100-62.5-25 MCG/ACT AEPB Inhale 1 puff into the lungs daily. 1 each 10  ? hydrochlorothiazide (HYDRODIURIL) 25 MG tablet Take 1 tablet (25 mg total) by mouth daily. 90 tablet 3  ? loperamide (IMODIUM) 2 MG capsule Take 1 capsule (2 mg total) by mouth 4 (four) times daily as needed for diarrhea or loose stools. 30 capsule 1  ? MULTIPLE VITAMIN PO Take 1 tablet by mouth daily. Reported on 03/13/2016    ? nystatin (MYCOSTATIN) 100000 UNIT/ML suspension TAKE 5ML BY MOUTH 4 TIMES DAILY 200 mL 0  ? simethicone (MYLICON) 80 MG chewable tablet Chew 1 tablet (80 mg total) by mouth every 6 (six) hours as needed for flatulence. 30 tablet 0  ? ?No current facility-administered medications for this visit.  ? ? ?Allergies as of 11/21/2021 - Review Complete 11/21/2021  ?Allergen Reaction Noted  ? Pravastatin sodium  03/18/2017  ? ? ?ROS: ? ?General: Negative for anorexia, weight loss, fever, chills, fatigue, weakness. ?ENT: Negative for hoarseness, difficulty swallowing , nasal congestion. ?CV: Negative for chest pain, angina, palpitations, dyspnea on  exertion, peripheral edema.  ?Respiratory: Negative for dyspnea at rest, dyspnea on exertion, cough, sputum, wheezing.  ?GI: See history of present illness. ?GU:  Negative for dysuria, hematuria, urinary incontinence, urinary frequency, nocturnal urination.  ?Endo: Negative for unusual weight change.  ?  ?Physical Examination: ? ? There were no vitals taken for this visit. ? ?General: Well-nourished, well-developed in no acute distress.  ?Eyes: No icterus. Conjunctivae pink. ?Lungs: Clear to auscultation bilaterally. Non-labored. ?Heart: Regular rate and rhythm, no murmurs rubs or gallops.  ?Abdomen: Bowel sounds are normal, nontender,  nondistended, no hepatosplenomegaly or masses, no abdominal bruits or hernia , no rebound or guarding.   ?Extremities: No lower extremity edema. No clubbing or deformities. ?Neuro: Alert and oriented x 3.  Grossly intact. ?Skin: Warm and dry, no jaundice.   ?Psych: Alert and cooperative, normal mood and affect. ? ?Labs:  ?  ?Imaging Studies: ?No results found. ? ?Assessment and Plan:  ? ?Alexandria [name] is a 66 y.o. y/o female who comes in today with a report of black stools and left-sided abdominal pain.  The patient had a colonoscopy with a precancerous polyp found.  The patient will be set up for a blood test to check for her blood count to make sure she is not anemic and she will also be set up for an upper endoscopy to look for any source of the cause of her black stools.  The patient has been explained the plan and agrees with it ? ? ? ? ?Lucilla Lame, MD. Marval Regal ? ? ? Note: This dictation was prepared with Dragon dictation along with smaller phrase technology. Any transcriptional errors that result from this process are unintentional.  ?

## 2021-11-21 NOTE — Addendum Note (Signed)
Addended by: Lurlean Nanny on: 11/21/2021 10:57 AM ? ? Modules accepted: Orders ? ?

## 2021-11-21 NOTE — Addendum Note (Signed)
Addended by: Lurlean Nanny on: 11/21/2021 11:29 AM ? ? Modules accepted: Orders ? ?

## 2021-11-22 LAB — CBC
Hematocrit: 46.2 % (ref 34.0–46.6)
Hemoglobin: 15.9 g/dL (ref 11.1–15.9)
MCH: 29.9 pg (ref 26.6–33.0)
MCHC: 34.4 g/dL (ref 31.5–35.7)
MCV: 87 fL (ref 79–97)
Platelets: 334 10*3/uL (ref 150–450)
RBC: 5.32 x10E6/uL — ABNORMAL HIGH (ref 3.77–5.28)
RDW: 12.8 % (ref 11.7–15.4)
WBC: 11.8 10*3/uL — ABNORMAL HIGH (ref 3.4–10.8)

## 2021-12-02 ENCOUNTER — Other Ambulatory Visit: Payer: Self-pay | Admitting: Family Medicine

## 2021-12-02 DIAGNOSIS — F419 Anxiety disorder, unspecified: Secondary | ICD-10-CM

## 2021-12-03 ENCOUNTER — Encounter: Payer: Self-pay | Admitting: Gastroenterology

## 2021-12-04 NOTE — Telephone Encounter (Signed)
Requested medication (s) are due for refill today: yes ? ?Requested medication (s) are on the active medication list: yes ? ?Last refill:  09/19/21 #45/2 ? ?Future visit scheduled: no ? ?Notes to clinic:  Unable to refill per protocol, cannot delegate. ? ? ?  ?Requested Prescriptions  ?Pending Prescriptions Disp Refills  ? clonazePAM (KLONOPIN) 0.5 MG tablet [Pharmacy Med Name: clonazePAM 0.5 MG Oral Tablet] 45 tablet   ?  Sig: TAKE 1 TABLET BY MOUTH DAILY,  MAY REPEAT DOSE IF NEEDED, MAX  OF 2 TABLETS IN 24 HOURS  ?  ? Not Delegated - Psychiatry: Anxiolytics/Hypnotics 2 Failed - 12/02/2021 11:48 AM  ?  ?  Failed - This refill cannot be delegated  ?  ?  Failed - Urine Drug Screen completed in last 360 days  ?  ?  Passed - Patient is not pregnant  ?  ?  Passed - Valid encounter within last 6 months  ?  Recent Outpatient Visits   ? ?      ? 3 months ago Change in stool  ? [facility] Tally Joe T, FNP  ? 4 months ago Prediabetes  ? [facility] Gwyneth Sprout, FNP  ? 5 months ago Thrush  ? [facility] Gwyneth Sprout, FNP  ? 8 months ago Annual physical exam  ? [place], PA-C  ? 1 year ago Acute exacerbation of chronic obstructive pulmonary disease (COPD) ([place])  ? [place], PA-C  ? ?  ?  ? ?  ?  ?  ? ?

## 2021-12-06 ENCOUNTER — Encounter
Admission: RE | Disposition: A | Discharge status: Home / Self Care | Payer: Self-pay | Source: Home / Self Care | Attending: Gastroenterology

## 2021-12-06 ENCOUNTER — Other Ambulatory Visit: Payer: Self-pay

## 2021-12-06 ENCOUNTER — Ambulatory Visit
Admission: RE | Admit: 2021-12-06 | Discharge: 2021-12-06 | Disposition: A | Discharge status: Home / Self Care | Payer: Medicare Other | Attending: Gastroenterology | Admitting: Gastroenterology

## 2021-12-06 ENCOUNTER — Ambulatory Visit: Payer: Medicare Other | Admitting: Anesthesiology

## 2021-12-06 ENCOUNTER — Encounter: Payer: Self-pay | Admitting: Gastroenterology

## 2021-12-06 DIAGNOSIS — K921 Melena: Secondary | ICD-10-CM

## 2021-12-06 DIAGNOSIS — E785 Hyperlipidemia, unspecified: Secondary | ICD-10-CM | POA: Diagnosis not present

## 2021-12-06 DIAGNOSIS — K2900 Acute gastritis without bleeding: Secondary | ICD-10-CM | POA: Diagnosis not present

## 2021-12-06 DIAGNOSIS — K2970 Gastritis, unspecified, without bleeding: Secondary | ICD-10-CM | POA: Diagnosis not present

## 2021-12-06 DIAGNOSIS — F17200 Nicotine dependence, unspecified, uncomplicated: Secondary | ICD-10-CM | POA: Insufficient documentation

## 2021-12-06 DIAGNOSIS — J45909 Unspecified asthma, uncomplicated: Secondary | ICD-10-CM | POA: Diagnosis not present

## 2021-12-06 DIAGNOSIS — I10 Essential (primary) hypertension: Secondary | ICD-10-CM | POA: Insufficient documentation

## 2021-12-06 DIAGNOSIS — F419 Anxiety disorder, unspecified: Secondary | ICD-10-CM | POA: Insufficient documentation

## 2021-12-06 DIAGNOSIS — B9681 Helicobacter pylori [H. pylori] as the cause of diseases classified elsewhere: Secondary | ICD-10-CM | POA: Diagnosis not present

## 2021-12-06 HISTORY — PX: ESOPHAGOGASTRODUODENOSCOPY (EGD) WITH PROPOFOL: SHX5813

## 2021-12-06 LAB — GLUCOSE, CAPILLARY: Glucose-Capillary: 112 mg/dL — ABNORMAL HIGH (ref 70–99)

## 2021-12-06 SURGERY — ESOPHAGOGASTRODUODENOSCOPY (EGD) WITH PROPOFOL
Anesthesia: General | Site: Throat

## 2021-12-06 MED ORDER — LACTATED RINGERS IV SOLN: INTRAVENOUS | Status: DC

## 2021-12-06 MED ORDER — PROPOFOL 10 MG/ML IV BOLUS
INTRAVENOUS | Status: DC | PRN
  Administered 2021-12-06: 30 mg via INTRAVENOUS
  Administered 2021-12-06: 150 mg via INTRAVENOUS

## 2021-12-06 MED ORDER — LIDOCAINE HCL (CARDIAC) PF 100 MG/5ML IV SOSY
PREFILLED_SYRINGE | INTRAVENOUS | Status: DC | PRN
  Administered 2021-12-06: 30 mg via INTRAVENOUS

## 2021-12-06 MED ORDER — SODIUM CHLORIDE 0.9 % IV SOLN: INTRAVENOUS | Status: DC

## 2021-12-06 MED ORDER — STERILE WATER FOR IRRIGATION IR SOLN
Status: DC | PRN
  Administered 2021-12-06: 250 mL

## 2021-12-06 SURGICAL SUPPLY — 8 items
BLOCK BITE ADLT 60FR LG STRAP (MISCELLANEOUS) ×1 IMPLANT
FORCEPS BIOP RAD 4 LRG CAP 4 (CUTTING FORCEPS) ×1 IMPLANT
GOWN CVR UNV OPN BCK APRN NK (MISCELLANEOUS) ×2 IMPLANT
GOWN ISOL THUMB LOOP REG UNIV (MISCELLANEOUS) ×4
KIT PRC NS LF DISP ENDO (KITS) ×1 IMPLANT
KIT PROCEDURE OLYMPUS (KITS) ×2
MANIFOLD NEPTUNE II (INSTRUMENTS) ×2 IMPLANT
WATER STERILE IRR 250ML POUR (IV SOLUTION) ×2 IMPLANT

## 2021-12-06 NOTE — Interval H&P Note (Signed)
? ?Alexandria Lame, MD [facility] ?[address] ?[place], [place] [address] ?Phone:[phone] ?Fax : [phone] ? ?Primary Care Physician:  Gwyneth Sprout, FNP ?Primary Gastroenterologist:  Dr. Allen Norris ? ?Pre-Procedure History & Physical: ?HPI:  Alexandria [name] is a 66 y.o. female is here for an endoscopy. ?  ?Past Medical History:  ?Diagnosis Date  ? Anxiety   ? Carbuncle and furuncle of trunk   ? HTN (hypertension)   ? Hyperlipidemia   ? Intrinsic asthma, unspecified   ? Methicillin resistant Staphylococcus aureus in conditions classified elsewhere and of unspecified site   ? Other psychological or physical stress, not elsewhere classified(V62.89)   ? Pre-diabetes   ? Tobacco abuse   ? ? ?Past Surgical History:  ?Procedure Laterality Date  ? COLONOSCOPY WITH PROPOFOL N/A 04/10/2015  ? Procedure: COLONOSCOPY WITH PROPOFOL;  Surgeon: Alexandria Lame, MD;  Location: [place];  Service: Endoscopy;  Laterality: N/A;  ? COLONOSCOPY WITH PROPOFOL N/A 06/25/2021  ? Procedure: COLONOSCOPY WITH PROPOFOL;  Surgeon: Alexandria Lame, MD;  Location: [place];  Service: Endoscopy;  Laterality: N/A;  leave at 8 arrival  ? POLYPECTOMY  04/10/2015  ? Procedure: POLYPECTOMY;  Surgeon: Alexandria Lame, MD;  Location: [place];  Service: Endoscopy;;  ? POLYPECTOMY N/A 06/25/2021  ? Procedure: POLYPECTOMY;  Surgeon: Alexandria Lame, MD;  Location: [place];  Service: Endoscopy;  Laterality: N/A;  ? STATUS POST ENDOMETRIAL ABLATION    ? TONSILLECTOMY    ? TUBAL LIGATION    ? bilateral, sterile ablation  ? VARICOSE VEINS    ? ? ?Prior to Admission medications   ?Medication Sig Start Date End Date Taking? Authorizing Provider  ?albuterol (VENTOLIN HFA) 108 (90 Base) MCG/ACT inhaler USE 2 INHALATIONS BY MOUTH  EVERY 4 HOURS AS NEEDED FOR WHEEZING OR SHORTNESS OF  BREATH 03/30/21  Yes Chrismon, Vickki Muff, PA-C  ?amLODipine (NORVASC) 10 MG tablet Take 1 tablet (10 mg total) by mouth daily. 04/05/21  Yes Chrismon, Vickki Muff,  PA-C  ?atorvastatin (LIPITOR) 20 MG tablet Take 1 tablet (20 mg total) by mouth daily. 04/05/21  Yes Chrismon, Vickki Muff, PA-C  ?cetirizine (ZYRTEC) 10 MG tablet Take 1 tablet (10 mg total) by mouth daily. 01/13/20  Yes Mar Daring, PA-C  ?clonazePAM (KLONOPIN) 0.5 MG tablet Take 0.5 tablets (0.25 mg total) by mouth 3 (three) times daily as needed for anxiety. Due for in office follow up for urine drug screen and controlled substance agreement for further refills. 12/05/21  Yes Gwyneth Sprout, FNP  ?famotidine (PEPCID) 20 MG tablet Take 20 mg by mouth 2 (two) times daily.   Yes [provider]  ?Fluticasone-Umeclidin-Vilant (TRELEGY ELLIPTA) 100-62.5-25 MCG/ACT AEPB Inhale 1 puff into the lungs daily. 09/27/21  Yes Gwyneth Sprout, FNP  ?hydrochlorothiazide (HYDRODIURIL) 25 MG tablet Take 1 tablet (25 mg total) by mouth daily. 04/05/21  Yes Chrismon, Vickki Muff, PA-C  ?loperamide (IMODIUM) 2 MG capsule Take 1 capsule (2 mg total) by mouth 4 (four) times daily as needed for diarrhea or loose stools. 08/09/21  Yes Gwyneth Sprout, FNP  ?metFORMIN (GLUCOPHAGE-XR) 750 MG 24 hr tablet Take 750 mg by mouth daily with supper.   Yes [provider]  ?MULTIPLE VITAMIN PO Take 1 tablet by mouth daily. Reported on 03/13/2016   Yes [provider]  ?nystatin (MYCOSTATIN) 100000 UNIT/ML suspension TAKE 5ML BY MOUTH 4 TIMES DAILY 06/28/21  Yes Gwyneth Sprout, FNP  ?simethicone (MYLICON) 80 MG chewable tablet  Chew 1 tablet (80 mg total) by mouth every 6 (six) hours as needed for flatulence. ?Patient not taking: Reported on 12/03/2021 07/17/21   Gwyneth Sprout, FNP  ?fluticasone (FLONASE) 50 MCG/ACT nasal spray Place 2 sprays into both nostrils daily. 12/09/19 01/06/20  Mar Daring, PA-C  ? ? ?Allergies as of 11/21/2021 - Review Complete 11/21/2021  ?Allergen Reaction Noted  ? Pravastatin sodium  03/18/2017  ? ? ?Family History  ?Problem Relation Age of Onset  ? Coronary artery disease Mother   ?  Hypertension Mother   ? Rheum arthritis Mother   ? Heart attack Mother   ? Lung cancer Father   ? Emphysema Father   ? Gout Father   ? Diabetes Sister   ? Hypertension Daughter   ? Hypertension Daughter   ? Obesity Daughter   ? Seizures Daughter   ? Breast cancer Neg Hx   ? Bladder Cancer Neg Hx   ? Kidney cancer Neg Hx   ? ? ?Social History  ? ?Socioeconomic History  ? Marital status: Married  ?  Spouse name: Remo Lipps  ? Number of children: 3  ? Years of education: 43  ? Highest education level: Not on file  ?Occupational History  ? Occupation: [employer]  ?  Comment: CNA  ?Tobacco Use  ? Smoking status: Every Day  ?  Packs/day: 0.50  ?  Years: 30.00  ?  Pack years: 15.00  ?  Types: Cigarettes  ?  Last attempt to quit: 01/31/2017  ?  Years since quitting: 4.8  ? Smokeless tobacco: Never  ?Vaping Use  ? Vaping Use: Never used  ?Substance and Sexual Activity  ? Alcohol use: No  ?  Alcohol/week: 0.0 standard drinks  ? Drug use: No  ? Sexual activity: Not on file  ?Other Topics Concern  ? Not on file  ?Social History Narrative  ? Not on file  ? ?Social Determinants of Health  ? ?Financial Resource Strain: Not on file  ?Food Insecurity: Not on file  ?Transportation Needs: Not on file  ?Physical Activity: Not on file  ?Stress: Not on file  ?Social Connections: Not on file  ?Intimate Partner Violence: Not on file  ? ? ?Review of Systems: ?See HPI, otherwise negative ROS ? ?Physical Exam: ?BP 138/67   Pulse 78   Temp (!) 97.5 ?F (36.4 ?C) (Temporal)   Resp 18   Ht '5\' 7"'$  (1.702 m)   Wt 86.1 kg   SpO2 94%   BMI 29.74 kg/m?  ?General:   Alert,  pleasant and cooperative in NAD ?Head:  Normocephalic and atraumatic. ?Neck:  Supple; no masses or thyromegaly. ?Lungs:  Clear throughout to auscultation.    ?Heart:  Regular rate and rhythm. ?Abdomen:  Soft, nontender and nondistended. Normal bowel sounds, without guarding, and without rebound.   ?Neurologic:  Alert and  oriented x4;  grossly normal  neurologically. ? ?Impression/Plan: ?Alexandria [name] is here for an endoscopy to be performed for melena ? ?Risks, benefits, limitations, and alternatives regarding  endoscopy have been reviewed with the patient.  Questions have been answered.  All parties agreeable. ? ? ?Alexandria Lame, MD  12/06/2021, 10:25 AM ?

## 2021-12-06 NOTE — Anesthesia Preprocedure Evaluation (Signed)
Anesthesia Evaluation  ?Patient identified by MRN, date of birth, ID band ?Patient awake ? ? ? ?Reviewed: ?Allergy & Precautions, H&P , NPO status , Patient's Chart, lab work & pertinent test results, reviewed documented beta blocker date and time  ? ?Airway ?Mallampati: II ? ?TM Distance: >3 FB ?Neck ROM: full ? ? ? Dental ?no notable dental hx. ? ?  ?Pulmonary ?neg pulmonary ROS, asthma , Current Smoker and Patient abstained from smoking.,  ?  ?Pulmonary exam normal ?breath sounds clear to auscultation ? ? ? ? ? ? Cardiovascular ?Exercise Tolerance: Good ?hypertension, negative cardio ROS ? ? ?Rhythm:regular Rate:Normal ? ?HLD ?  ?Neuro/Psych ?Anxiety negative neurological ROS ? negative psych ROS  ? GI/Hepatic ?negative GI ROS, Neg liver ROS,   ?Endo/Other  ?negative endocrine ROS ? Renal/GU ?negative Renal ROS  ?negative genitourinary ?  ?Musculoskeletal ? ? Abdominal ?  ?Peds ? Hematology ?negative hematology ROS ?(+)   ?Anesthesia Other Findings ? ? Reproductive/Obstetrics ?negative OB ROS ? ?  ? ? ? ? ? ? ? ? ? ? ? ? ? ?  ?  ? ? ? ? ? ? ? ? ?Anesthesia Physical ?Anesthesia Plan ? ?ASA: 2 ? ?Anesthesia Plan: General  ? ?Post-op Pain Management:   ? ?Induction:  ? ?PONV Risk Score and Plan: 2 and Propofol infusion, TIVA and Treatment may vary due to age or medical condition ? ?Airway Management Planned:  ? ?Additional Equipment:  ? ?Intra-op Plan:  ? ?Post-operative Plan:  ? ?Informed Consent: I have reviewed the patients History and Physical, chart, labs and discussed the procedure including the risks, benefits and alternatives for the proposed anesthesia with the patient or authorized representative who has indicated his/her understanding and acceptance.  ? ? ? ?Dental Advisory Given ? ?Plan Discussed with: CRNA ? ?Anesthesia Plan Comments:   ? ? ? ? ? ? ?Anesthesia Quick Evaluation ? ?

## 2021-12-06 NOTE — Transfer of Care (Signed)
Immediate Anesthesia Transfer of Care Note ? ?Patient: Alexandria [name] ? ?Procedure(s) Performed: ESOPHAGOGASTRODUODENOSCOPY (EGD) WITH PROPOFOL (Throat) ? ?Patient Location: PACU ? ?Anesthesia Type: General ? ?Level of Consciousness: awake, alert  and patient cooperative ? ?Airway and Oxygen Therapy: Patient Spontanous Breathing and Patient connected to supplemental oxygen ? ?Post-op Assessment: Post-op Vital signs reviewed, Patient's Cardiovascular Status Stable, Respiratory Function Stable, Patent Airway and No signs of Nausea or vomiting ? ?Post-op Vital Signs: Reviewed and stable ? ?Complications: No notable events documented. ? ?

## 2021-12-06 NOTE — Anesthesia Procedure Notes (Signed)
Date/Time: 12/06/2021 10:32 AM ?Performed by: Cameron Ali, CRNA ?Pre-anesthesia Checklist: Patient identified, Emergency Drugs available, Suction available, Timeout performed and Patient being monitored ?Patient Re-evaluated:Patient Re-evaluated prior to induction ?Oxygen Delivery Method: Nasal cannula ?Placement Confirmation: positive ETCO2 ? ? ? ? ?

## 2021-12-06 NOTE — Op Note (Signed)
[facility] ?Gastroenterology ?Patient Name: Alexandria [name] ?Procedure Date: 12/06/2021 10:23 AM ?MRN: [id number] ?Account #: [id number] ?Date of Birth: May 07, 1956 ?Admit Type: Outpatient ?Age: 66 ?Room: [facility] OR ROOM 01 ?Gender: Female ?Note Status: Finalized ?Instrument Name: [id number] ?Procedure:             Upper GI endoscopy ?Indications:           Melena ?Providers:             Lucilla Lame MD, MD ?Referring MD:          Jaci Standard. Rollene Rotunda (Referring MD) ?Medicines:             Propofol per Anesthesia ?Complications:         No immediate complications. ?Procedure:             Pre-Anesthesia Assessment: ?                       - Prior to the procedure, a History and Physical was  ?                       performed, and patient medications and allergies were  ?                       reviewed. The patient's tolerance of previous  ?                       anesthesia was also reviewed. The risks and benefits  ?                       of the procedure and the sedation options and risks  ?                       were discussed with the patient. All questions were  ?                       answered, and informed consent was obtained. Prior  ?                       Anticoagulants: The patient has taken no previous  ?                       anticoagulant or antiplatelet agents. ASA Grade  ?                       Assessment: II - A patient with mild systemic disease.  ?                       After reviewing the risks and benefits, the patient  ?                       was deemed in satisfactory condition to undergo the  ?                       procedure. ?                       After obtaining informed consent, the endoscope was  ?  passed under direct vision. Throughout the procedure,  ?                       the patient's blood pressure, pulse, and oxygen  ?                       saturations were monitored continuously. The Endoscope  ?                       was introduced through the mouth, and advanced  to the  ?                       second part of duodenum. The upper GI endoscopy was  ?                       accomplished without difficulty. The patient tolerated  ?                       the procedure well. ?Findings: ?     The examined esophagus was normal. ?     Localized moderate inflammation characterized by erythema was found in  ?     the gastric antrum. Biopsies were taken with a cold forceps for  ?     histology. ?     The examined duodenum was normal. ?Impression:            - Normal esophagus. ?                       - Gastritis. Biopsied. ?                       - Normal examined duodenum. ?Recommendation:        - Discharge patient to home. ?                       - Resume previous diet. ?                       - Continue present medications. ?                       - Await pathology results. ?Procedure Code(s):     --- Professional --- ?                       [phone], Esophagogastroduodenoscopy, flexible,  ?                       transoral; with biopsy, single or multiple ?Diagnosis Code(s):     --- Professional --- ?                       K92.1, Melena (includes Hematochezia) ?                       K29.70, Gastritis, unspecified, without bleeding ?CPT copyright 2019 [employer] Association. All rights reserved. ?The codes documented in this report are preliminary and upon coder review may  ?be revised to meet current compliance requirements. ?Lucilla Lame MD, MD ?12/06/2021 10:41:41 AM ?This report has been signed electronically. ?Number of Addenda: 0 ?Note Initiated On: 12/06/2021 10:23 AM ?Total Procedure Duration: 0 hours  2 minutes 37 seconds  ?Estimated Blood Loss:  Estimated blood loss: none. ?     [facility] ?

## 2021-12-06 NOTE — Anesthesia Postprocedure Evaluation (Signed)
Anesthesia Post Note ? ?Patient: Alexandria [name] ? ?Procedure(s) Performed: ESOPHAGOGASTRODUODENOSCOPY (EGD) WITH PROPOFOL (Throat) ? ? ?  ?Patient location during evaluation: PACU ?Anesthesia Type: General ?Level of consciousness: awake and alert ?Pain management: pain level controlled ?Vital Signs Assessment: post-procedure vital signs reviewed and stable ?Respiratory status: spontaneous breathing, nonlabored ventilation and respiratory function stable ?Cardiovascular status: blood pressure returned to baseline and stable ?Postop Assessment: no apparent nausea or vomiting ?Anesthetic complications: no ? ? ?No notable events documented. ? ?April Manson ? ? ? ? ? ?

## 2021-12-07 ENCOUNTER — Encounter: Payer: Self-pay | Admitting: Gastroenterology

## 2021-12-07 LAB — SURGICAL PATHOLOGY

## 2021-12-10 ENCOUNTER — Other Ambulatory Visit: Payer: Self-pay

## 2021-12-10 ENCOUNTER — Ambulatory Visit (INDEPENDENT_AMBULATORY_CARE_PROVIDER_SITE_OTHER): Payer: Medicare Other

## 2021-12-10 ENCOUNTER — Telehealth: Payer: Self-pay | Admitting: Gastroenterology

## 2021-12-10 ENCOUNTER — Encounter: Payer: Self-pay | Admitting: Emergency Medicine

## 2021-12-10 ENCOUNTER — Ambulatory Visit
Admission: EM | Admit: 2021-12-10 | Discharge: 2021-12-10 | Disposition: A | Discharge status: Home / Self Care | Payer: Medicare Other | Attending: Physician Assistant | Admitting: Physician Assistant

## 2021-12-10 DIAGNOSIS — A048 Other specified bacterial intestinal infections: Secondary | ICD-10-CM

## 2021-12-10 DIAGNOSIS — M542 Cervicalgia: Secondary | ICD-10-CM | POA: Diagnosis not present

## 2021-12-10 DIAGNOSIS — R202 Paresthesia of skin: Secondary | ICD-10-CM

## 2021-12-10 DIAGNOSIS — M25512 Pain in left shoulder: Secondary | ICD-10-CM

## 2021-12-10 DIAGNOSIS — M19012 Primary osteoarthritis, left shoulder: Secondary | ICD-10-CM | POA: Diagnosis not present

## 2021-12-10 MED ORDER — PANTOPRAZOLE SODIUM 20 MG PO TBEC: 20.0000 mg | DELAYED_RELEASE_TABLET | Freq: Two times a day (BID) | ORAL | 0 refills | Status: DC

## 2021-12-10 MED ORDER — AMOXICILLIN 500 MG PO TABS: 1000.0000 mg | ORAL_TABLET | Freq: Two times a day (BID) | ORAL | 0 refills | Status: DC

## 2021-12-10 MED ORDER — BACLOFEN 10 MG PO TABS: 10.0000 mg | ORAL_TABLET | Freq: Three times a day (TID) | ORAL | 0 refills | Status: AC | PRN

## 2021-12-10 MED ORDER — CLARITHROMYCIN 500 MG PO TABS: 500.0000 mg | ORAL_TABLET | Freq: Two times a day (BID) | ORAL | 0 refills | Status: DC

## 2021-12-10 MED ORDER — DEXAMETHASONE SODIUM PHOSPHATE 10 MG/ML IJ SOLN
10.0000 mg | Freq: Once | INTRAMUSCULAR | Status: AC
  Administered 2021-12-10: 10 mg via INTRAMUSCULAR

## 2021-12-10 MED ORDER — HYDROCODONE-ACETAMINOPHEN 7.5-325 MG PO TABS: 1.0000 | ORAL_TABLET | Freq: Four times a day (QID) | ORAL | 0 refills | Status: AC | PRN

## 2021-12-10 NOTE — Discharge Instructions (Signed)
PAIN: Presentation is consistent with pinched nerve, likely bulging disc in your neck.  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  Stressed avoiding painful activities. This can exacerbate your symptoms and make them worse.  May apply heat to the areas of pain for some relief. Use medications as directed. Be aware of which medications make you drowsy and do not drive or operate any kind of heavy machinery while using the medication (ie pain medications or muscle relaxers).  You need to avoid NSAIDs and corticosteroids since you have the bacterial infection in your stomach.  These can increase your risk for ulcer and bleeding.  Take Tylenol or the pain medication for pain.  F/U with PCP for reexamination or return sooner if condition worsens or does not begin to improve over the next few days.  ? ?NECK PAIN RED FLAGS: If symptoms get worse than they are right now, you should come back sooner for re-evaluation. If you have increased numbness/ tingling or notice that the numbness/tingling is affecting the legs or saddle region, go to ER. If you ever lose continence go to ER.     ?

## 2021-12-10 NOTE — ED Provider Notes (Signed)
?[facility] ? ? ? ?CSN: [id number] ?Arrival date & time: 12/10/21  1454 ? ? ?  ? ?History   ?Chief Complaint ?Chief Complaint  ?Patient presents with  ? Shoulder Pain  ?  left  ? ? ?HPI ?Alexandria [name] is a 66 y.o. female presenting for 5-day history of left shoulder pain.  Patient says feels like the pain radiates to the left side of her neck.  Denies any pain down her arm but says that her thumb and index finger are numb and tingly.  Also reports a little bit of numbness in the forearm.  Patient has full range of motion of her shoulder.  She has some increased pain when she reaches behind her back.  She says the symptoms are somewhat relieved when she raises her arm above her head and rest it on her head.  Patient has tried NSAIDs but had to stop taking them because it upset her stomach.  She was recently diagnosed with H. pylori infection and is supposed to go pick up antibiotics and antacids at the pharmacy today.  Patient tried Tylenol for pain as well without improvement in symptoms.  She says she is just been trying to rest and keep it elevated.  She denies any specific injury but says symptoms started the night that she lifted a heavy box that [employer] dropped off on her doorstep.  Patient denies having any issues with her shoulder in the past.  She denies any associated chest pain, shortness of breath, palpitations or arm weakness.  No diaphoresis.  Patient has past medical history of asthma and tobacco abuse as well as COPD hypertension, hyperlipidemia and anxiety. ? ?HPI ? ?Past Medical History:  ?Diagnosis Date  ? Anxiety   ? Carbuncle and furuncle of trunk   ? HTN (hypertension)   ? Hyperlipidemia   ? Intrinsic asthma, unspecified   ? Methicillin resistant Staphylococcus aureus in conditions classified elsewhere and of unspecified site   ? Other psychological or physical stress, not elsewhere classified(V62.89)   ? Pre-diabetes   ? Tobacco abuse   ? ? ?Patient Active Problem List  ? Diagnosis Date  Noted  ? Melena   ? Gastritis without bleeding   ? Diarrhea 08/09/2021  ? Dizziness 08/09/2021  ? Bloating 07/17/2021  ? Prediabetes 07/17/2021  ? Need for influenza vaccination 07/17/2021  ? Encounter for smoking cessation counseling 07/17/2021  ? Change in stool 07/17/2021  ? Personal history of colonic polyps   ? Polyp of ascending colon   ? Thrush 06/20/2021  ? Anxiety   ? Chronic obstructive pulmonary disease ([place]) 05/19/2018  ? Diverticulosis 03/17/2017  ? Allergic rhinitis 01/29/2017  ? Hx of colonic polyps   ? History of anxiety 03/10/2015  ? HTN (hypertension)   ? Hyperlipidemia   ? ? ?Past Surgical History:  ?Procedure Laterality Date  ? COLONOSCOPY WITH PROPOFOL N/A 04/10/2015  ? Procedure: COLONOSCOPY WITH PROPOFOL;  Surgeon: Lucilla Lame, MD;  Location: [place];  Service: Endoscopy;  Laterality: N/A;  ? COLONOSCOPY WITH PROPOFOL N/A 06/25/2021  ? Procedure: COLONOSCOPY WITH PROPOFOL;  Surgeon: Lucilla Lame, MD;  Location: [place];  Service: Endoscopy;  Laterality: N/A;  leave at 8 arrival  ? ESOPHAGOGASTRODUODENOSCOPY (EGD) WITH PROPOFOL N/A 12/06/2021  ? Procedure: ESOPHAGOGASTRODUODENOSCOPY (EGD) WITH PROPOFOL;  Surgeon: Lucilla Lame, MD;  Location: [place];  Service: Endoscopy;  Laterality: N/A;  ? POLYPECTOMY  04/10/2015  ? Procedure: POLYPECTOMY;  Surgeon: Lucilla Lame, MD;  Location[facility];  Service: Endoscopy;;  ? POLYPECTOMY N/A 06/25/2021  ? Procedure: POLYPECTOMY;  Surgeon: Lucilla Lame, MD;  Location: [place];  Service: Endoscopy;  Laterality: N/A;  ? STATUS POST ENDOMETRIAL ABLATION    ? TONSILLECTOMY    ? TUBAL LIGATION    ? bilateral, sterile ablation  ? VARICOSE VEINS    ? ? ?OB History   ? ? Gravida  ?3  ? Para  ?3  ? Term  ?   ? Preterm  ?   ? AB  ?   ? Living  ?   ?  ? ? SAB  ?   ? IAB  ?   ? Ectopic  ?   ? Multiple  ?   ? Live Births  ?   ?   ?  ?  ? ? ? ?Home Medications   ? ?Prior to Admission medications   ?Medication Sig Start  Date End Date Taking? Authorizing Provider  ?albuterol (VENTOLIN HFA) 108 (90 Base) MCG/ACT inhaler USE 2 INHALATIONS BY MOUTH  EVERY 4 HOURS AS NEEDED FOR WHEEZING OR SHORTNESS OF  BREATH 03/30/21  Yes Chrismon, Vickki Muff, PA-C  ?amLODipine (NORVASC) 10 MG tablet Take 1 tablet (10 mg total) by mouth daily. 04/05/21  Yes Chrismon, Vickki Muff, PA-C  ?atorvastatin (LIPITOR) 20 MG tablet Take 1 tablet (20 mg total) by mouth daily. 04/05/21  Yes Chrismon, Vickki Muff, PA-C  ?baclofen (LIORESAL) 10 MG tablet Take 1 tablet (10 mg total) by mouth 3 (three) times daily as needed for up to 10 days for muscle spasms. 12/10/21 12/20/21 Yes Danton Clap, PA-C  ?cetirizine (ZYRTEC) 10 MG tablet Take 1 tablet (10 mg total) by mouth daily. 01/13/20  Yes Mar Daring, PA-C  ?clonazePAM (KLONOPIN) 0.5 MG tablet Take 0.5 tablets (0.25 mg total) by mouth 3 (three) times daily as needed for anxiety. Due for in office follow up for urine drug screen and controlled substance agreement for further refills. 12/05/21  Yes Gwyneth Sprout, FNP  ?famotidine (PEPCID) 20 MG tablet Take 20 mg by mouth 2 (two) times daily.   Yes [provider]  ?Fluticasone-Umeclidin-Vilant (TRELEGY ELLIPTA) 100-62.5-25 MCG/ACT AEPB Inhale 1 puff into the lungs daily. 09/27/21  Yes Gwyneth Sprout, FNP  ?hydrochlorothiazide (HYDRODIURIL) 25 MG tablet Take 1 tablet (25 mg total) by mouth daily. 04/05/21  Yes Chrismon, Vickki Muff, PA-C  ?HYDROcodone-acetaminophen (NORCO) 7.5-325 MG tablet Take 1 tablet by mouth every 6 (six) hours as needed for up to 5 days for moderate pain. 12/10/21 12/15/21 Yes Danton Clap, PA-C  ?metFORMIN (GLUCOPHAGE-XR) 750 MG 24 hr tablet Take 750 mg by mouth daily with supper.   Yes [provider]  ?MULTIPLE VITAMIN PO Take 1 tablet by mouth daily. Reported on 03/13/2016   Yes [provider]  ?amoxicillin (AMOXIL) 500 MG tablet Take 2 tablets (1,000 mg total) by mouth 2 (two) times daily. 12/10/21   Lucilla Lame, MD   ?clarithromycin (BIAXIN) 500 MG tablet Take 1 tablet (500 mg total) by mouth 2 (two) times daily. 12/10/21   Lucilla Lame, MD  ?loperamide (IMODIUM) 2 MG capsule Take 1 capsule (2 mg total) by mouth 4 (four) times daily as needed for diarrhea or loose stools. 08/09/21   Gwyneth Sprout, FNP  ?nystatin (MYCOSTATIN) 100000 UNIT/ML suspension TAKE 5ML BY MOUTH 4 TIMES DAILY 06/28/21   Gwyneth Sprout, FNP  ?pantoprazole (PROTONIX) 20 MG tablet Take 1 tablet (20 mg total) by mouth  2 (two) times daily. 12/10/21   Lucilla Lame, MD  ?simethicone (MYLICON) 80 MG chewable tablet Chew 1 tablet (80 mg total) by mouth every 6 (six) hours as needed for flatulence. ?Patient not taking: Reported on 12/03/2021 07/17/21   Gwyneth Sprout, FNP  ?fluticasone (FLONASE) 50 MCG/ACT nasal spray Place 2 sprays into both nostrils daily. 12/09/19 01/06/20  Mar Daring, PA-C  ? ? ?Family History ?Family History  ?Problem Relation Age of Onset  ? Coronary artery disease Mother   ? Hypertension Mother   ? Rheum arthritis Mother   ? Heart attack Mother   ? Lung cancer Father   ? Emphysema Father   ? Gout Father   ? Diabetes Sister   ? Hypertension Daughter   ? Hypertension Daughter   ? Obesity Daughter   ? Seizures Daughter   ? Breast cancer Neg Hx   ? Bladder Cancer Neg Hx   ? Kidney cancer Neg Hx   ? ? ?Social History ?Social History  ? ?Tobacco Use  ? Smoking status: Every Day  ?  Packs/day: 0.50  ?  Years: 30.00  ?  Pack years: 15.00  ?  Types: Cigarettes  ?  Last attempt to quit: 01/31/2017  ?  Years since quitting: 4.8  ? Smokeless tobacco: Never  ?Vaping Use  ? Vaping Use: Never used  ?Substance Use Topics  ? Alcohol use: No  ?  Alcohol/week: 0.0 standard drinks  ? Drug use: No  ? ? ? ?Allergies   ?Pravastatin sodium ? ? ?Review of Systems ?Review of Systems  ?Constitutional:  Negative for fatigue and fever.  ?Respiratory:  Negative for shortness of breath and wheezing.   ?Cardiovascular:  Negative for chest pain, palpitations and leg  swelling.  ?Gastrointestinal:  Negative for abdominal pain.  ?Musculoskeletal:  Positive for arthralgias and neck pain. Negative for back pain, gait problem, joint swelling and neck stiffness.  ?Skin:  Negative f

## 2021-12-10 NOTE — ED Triage Notes (Signed)
Pt c/o left shoulder pain. Started about 6 days ago. She states she lifted a heavy box and that evening it was hurting but the next day the pain was worse.  ?

## 2021-12-10 NOTE — Telephone Encounter (Signed)
Pt left message to get prescription for anibiotics for bacterial infection that was found after endoscopy  ?

## 2022-01-04 ENCOUNTER — Telehealth: Payer: Self-pay | Admitting: Family Medicine

## 2022-01-04 DIAGNOSIS — I10 Essential (primary) hypertension: Secondary | ICD-10-CM

## 2022-01-04 MED ORDER — AMLODIPINE BESYLATE 10 MG PO TABS: 10.0000 mg | ORAL_TABLET | Freq: Every day | ORAL | 3 refills | Status: DC

## 2022-01-04 NOTE — Telephone Encounter (Signed)
[facility] faxed refill request for the following medications: ? ?amLODipine (NORVASC) 10 MG tablet  ? ?Please advise. ? ?

## 2022-01-16 DIAGNOSIS — L578 Other skin changes due to chronic exposure to nonionizing radiation: Secondary | ICD-10-CM | POA: Diagnosis not present

## 2022-01-16 DIAGNOSIS — L821 Other seborrheic keratosis: Secondary | ICD-10-CM | POA: Diagnosis not present

## 2022-01-16 DIAGNOSIS — Z872 Personal history of diseases of the skin and subcutaneous tissue: Secondary | ICD-10-CM | POA: Diagnosis not present

## 2022-01-22 DIAGNOSIS — A048 Other specified bacterial intestinal infections: Secondary | ICD-10-CM | POA: Diagnosis not present

## 2022-01-24 LAB — H. PYLORI BREATH TEST: H pylori Breath Test: NEGATIVE

## 2022-01-25 ENCOUNTER — Other Ambulatory Visit: Payer: Self-pay | Admitting: Family Medicine

## 2022-01-25 DIAGNOSIS — F419 Anxiety disorder, unspecified: Secondary | ICD-10-CM

## 2022-02-06 ENCOUNTER — Telehealth: Payer: Self-pay | Admitting: Family Medicine

## 2022-02-06 ENCOUNTER — Other Ambulatory Visit: Payer: Self-pay

## 2022-02-06 DIAGNOSIS — J449 Chronic obstructive pulmonary disease, unspecified: Secondary | ICD-10-CM

## 2022-02-06 MED ORDER — ALBUTEROL SULFATE HFA 108 (90 BASE) MCG/ACT IN AERS: 2.0000 | INHALATION_SPRAY | RESPIRATORY_TRACT | 3 refills | Status: DC | PRN

## 2022-02-06 NOTE — Telephone Encounter (Signed)
[facility] faxed refill request for the following medications:  albuterol (VENTOLIN HFA) 108 (90 Base) MCG/ACT inhaler    Please advise.

## 2022-02-07 ENCOUNTER — Telehealth: Payer: Self-pay | Admitting: Family Medicine

## 2022-02-07 DIAGNOSIS — E782 Mixed hyperlipidemia: Secondary | ICD-10-CM

## 2022-02-07 MED ORDER — ATORVASTATIN CALCIUM 20 MG PO TABS: 20.0000 mg | ORAL_TABLET | Freq: Every day | ORAL | 0 refills | Status: DC

## 2022-02-07 NOTE — Telephone Encounter (Signed)
Patient has appointment scheduled with provider 04/11/2022. Prescription sent in.

## 2022-02-07 NOTE — Addendum Note (Signed)
Addended by: Doristine Devoid on: 02/07/2022 08:59 AM   Modules accepted: Orders

## 2022-02-07 NOTE — Telephone Encounter (Signed)
[facility] faxed refill request for the following medications:  atorvastatin (LIPITOR) 20 MG tablet   Please advise.

## 2022-02-19 ENCOUNTER — Other Ambulatory Visit: Payer: Self-pay | Admitting: Family Medicine

## 2022-02-19 ENCOUNTER — Telehealth: Payer: Self-pay | Admitting: Family Medicine

## 2022-02-19 DIAGNOSIS — I10 Essential (primary) hypertension: Secondary | ICD-10-CM

## 2022-02-19 MED ORDER — HYDROCHLOROTHIAZIDE 25 MG PO TABS: 25.0000 mg | ORAL_TABLET | Freq: Every day | ORAL | 1 refills | Status: DC

## 2022-02-19 MED ORDER — ALBUTEROL SULFATE 108 (90 BASE) MCG/ACT IN AEPB: 2.0000 | INHALATION_SPRAY | Freq: Four times a day (QID) | RESPIRATORY_TRACT | 4 refills | Status: DC | PRN

## 2022-02-19 NOTE — Telephone Encounter (Signed)
[facility] faxed refill request for the following medications:  hydrochlorothiazide (HYDRODIURIL) 25 MG tablet    Please advise.  

## 2022-04-06 ENCOUNTER — Other Ambulatory Visit: Payer: Self-pay | Admitting: Family Medicine

## 2022-04-06 DIAGNOSIS — F419 Anxiety disorder, unspecified: Secondary | ICD-10-CM

## 2022-04-08 ENCOUNTER — Ambulatory Visit (INDEPENDENT_AMBULATORY_CARE_PROVIDER_SITE_OTHER): Payer: Medicare Other

## 2022-04-08 VITALS — Wt 189.0 lb

## 2022-04-08 DIAGNOSIS — Z0000 Encounter for general adult medical examination without abnormal findings: Secondary | ICD-10-CM

## 2022-04-08 NOTE — Progress Notes (Signed)
Virtual Visit via Telephone Note  I connected with  Alexandria [name] on 04/08/22 at  1:30 PM EDT by telephone and verified that I am speaking with the correct person using two identifiers.  Location: Patient: HOME Provider: [facility] Persons participating in the virtual visit: patient/Nurse Health Advisor   I discussed the limitations, risks, security and privacy concerns of performing an evaluation and management service by telephone and the availability of in person appointments. The patient expressed understanding and agreed to proceed.  Interactive audio and video telecommunications were attempted between this nurse and patient, however failed, due to patient having technical difficulties OR patient did not have access to video capability.  We continued and completed visit with audio only.  Some vital signs may be absent or patient reported.   Dionisio David, LPN  Subjective:   Alexandria [name] is a 66 y.o. female who presents for Medicare Annual (Subsequent) preventive examination.  Review of Systems     Cardiac Risk Factors include: advanced age (>28mn, >>59women)     Objective:    There were no vitals filed for this visit. There is no height or weight on file to calculate BMI.     04/08/2022    1:44 PM 12/10/2021    3:15 PM 01/06/2020    5:30 PM 05/11/2019    1:38 PM 03/13/2016   10:16 AM 06/17/2015    1:30 PM 04/10/2015    9:26 AM  Advanced Directives  Does Patient Have a Medical Advance Directive? No No No No No No No  Would patient like information on creating a medical advance directive? No - Patient declined     No - patient declined information No - patient declined information    Current Medications (verified) Outpatient Encounter Medications as of 04/08/2022  Medication Sig   Albuterol Sulfate (PROAIR RESPICLICK) 1213(90 Base) MCG/ACT AEPB Inhale 2 puffs into the lungs 4 (four) times daily as needed.   amLODipine (NORVASC) 10 MG tablet Take 1 tablet (10 mg total) by mouth daily.    atorvastatin (LIPITOR) 20 MG tablet Take 1 tablet (20 mg total) by mouth daily.   cetirizine (ZYRTEC) 10 MG tablet Take 1 tablet (10 mg total) by mouth daily.   clonazePAM (KLONOPIN) 0.5 MG tablet TAKE ONE-HALF TABLET BY MOUTH 3  TIMES DAILY AS NEEDED FOR  ANXIETY   famotidine (PEPCID) 20 MG tablet Take 20 mg by mouth 2 (two) times daily.   Fluticasone-Umeclidin-Vilant (TRELEGY ELLIPTA) 100-62.5-25 MCG/ACT AEPB Inhale 1 puff into the lungs daily.   hydrochlorothiazide (HYDRODIURIL) 25 MG tablet Take 1 tablet (25 mg total) by mouth daily.   loperamide (IMODIUM) 2 MG capsule Take 1 capsule (2 mg total) by mouth 4 (four) times daily as needed for diarrhea or loose stools.   metFORMIN (GLUCOPHAGE-XR) 750 MG 24 hr tablet Take 750 mg by mouth daily with supper.   MULTIPLE VITAMIN PO Take 1 tablet by mouth daily. Reported on 03/13/2016   simethicone (MYLICON) 80 MG chewable tablet Chew 1 tablet (80 mg total) by mouth every 6 (six) hours as needed for flatulence.   amoxicillin (AMOXIL) 500 MG capsule Take 1,000 mg by mouth 2 (two) times daily. (Patient not taking: Reported on 04/08/2022)   amoxicillin (AMOXIL) 500 MG tablet Take 2 tablets (1,000 mg total) by mouth 2 (two) times daily. (Patient not taking: Reported on 04/08/2022)   clarithromycin (BIAXIN) 500 MG tablet Take 1 tablet (500 mg total) by mouth 2 (two) times daily. (Patient not  taking: Reported on 04/08/2022)   nystatin (MYCOSTATIN) 100000 UNIT/ML suspension TAKE 5ML BY MOUTH 4 TIMES DAILY (Patient not taking: Reported on 04/08/2022)   pantoprazole (PROTONIX) 20 MG tablet Take 1 tablet (20 mg total) by mouth 2 (two) times daily. (Patient not taking: Reported on 04/08/2022)   [DISCONTINUED] CLENPIQ 10-3.5-12 MG-GM -GM/160ML SOLN  (Patient not taking: Reported on 04/08/2022)   [DISCONTINUED] clonazePAM (KLONOPIN) 0.5 MG tablet Take 0.5 tablets (0.25 mg total) by mouth 3 (three) times daily as needed for anxiety. Due for in office follow up for urine drug  screen and controlled substance agreement for further refills.   [DISCONTINUED] fluticasone (FLONASE) 50 MCG/ACT nasal spray Place 2 sprays into both nostrils daily.   No facility-administered encounter medications on file as of 04/08/2022.    Allergies (verified) Pravastatin sodium   History: Past Medical History:  Diagnosis Date   Anxiety    Carbuncle and furuncle of trunk    HTN (hypertension)    Hyperlipidemia    Intrinsic asthma, unspecified    Methicillin resistant Staphylococcus aureus in conditions classified elsewhere and of unspecified site    Other psychological or physical stress, not elsewhere classified(V62.89)    Pre-diabetes    Tobacco abuse    Past Surgical History:  Procedure Laterality Date   COLONOSCOPY WITH PROPOFOL N/A 04/10/2015   Procedure: COLONOSCOPY WITH PROPOFOL;  Surgeon: Lucilla Lame, MD;  Location: [place];  Service: Endoscopy;  Laterality: N/A;   COLONOSCOPY WITH PROPOFOL N/A 06/25/2021   Procedure: COLONOSCOPY WITH PROPOFOL;  Surgeon: Lucilla Lame, MD;  Location: [place];  Service: Endoscopy;  Laterality: N/A;  leave at 8 arrival   ESOPHAGOGASTRODUODENOSCOPY (EGD) WITH PROPOFOL N/A 12/06/2021   Procedure: ESOPHAGOGASTRODUODENOSCOPY (EGD) WITH PROPOFOL;  Surgeon: Lucilla Lame, MD;  Location: [place];  Service: Endoscopy;  Laterality: N/A;   POLYPECTOMY  04/10/2015   Procedure: POLYPECTOMY;  Surgeon: Lucilla Lame, MD;  Location: [place];  Service: Endoscopy;;   POLYPECTOMY N/A 06/25/2021   Procedure: POLYPECTOMY;  Surgeon: Lucilla Lame, MD;  Location: [place];  Service: Endoscopy;  Laterality: N/A;   STATUS POST ENDOMETRIAL ABLATION     TONSILLECTOMY     TUBAL LIGATION     bilateral, sterile ablation   VARICOSE VEINS     Family History  Problem Relation Age of Onset   Coronary artery disease Mother    Hypertension Mother    Rheum arthritis Mother    Heart attack Mother    Lung cancer Father     Emphysema Father    Gout Father    Diabetes Sister    Hypertension Daughter    Hypertension Daughter    Obesity Daughter    Seizures Daughter    Breast cancer Neg Hx    Bladder Cancer Neg Hx    Kidney cancer Neg Hx    Social History   Socioeconomic History   Marital status: Married    Spouse name: Remo Lipps   Number of children: 3   Years of education: 12   Highest education level: Not on file  Occupational History   Occupation: [employer]    Comment: CNA  Tobacco Use   Smoking status: Every Day    Packs/day: 0.50    Years: 30.00    Total pack years: 15.00    Types: Cigarettes    Last attempt to quit: 01/31/2017    Years since quitting: 5.1   Smokeless tobacco: Never  Vaping Use   Vaping Use:  Never used  Substance and Sexual Activity   Alcohol use: No    Alcohol/week: 0.0 standard drinks of alcohol   Drug use: No   Sexual activity: Not on file  Other Topics Concern   Not on file  Social History Narrative   Not on file   Social Determinants of Health   Financial Resource Strain: Low Risk  (04/08/2022)   Overall Financial Resource Strain (CARDIA)    Difficulty of Paying Living Expenses: Not hard at all  Food Insecurity: No Food Insecurity (04/08/2022)   Hunger Vital Sign    Worried About Running Out of Food in the Last Year: Never true    Ran Out of Food in the Last Year: Never true  Transportation Needs: No Transportation Needs (04/08/2022)   PRAPARE - Hydrologist (Medical): No    Lack of Transportation (Non-Medical): No  Physical Activity: Insufficiently Active (04/08/2022)   Exercise Vital Sign    Days of Exercise per Week: 2 days    Minutes of Exercise per Session: 20 min  Stress: No Stress Concern Present (04/08/2022)   Ord    Feeling of Stress : Not at all  Social Connections: Socially Isolated (04/08/2022)   Social Connection and Isolation Panel  [NHANES]    Frequency of Communication with Friends and Family: More than three times a week    Frequency of Social Gatherings with Friends and Family: Once a week    Attends Religious Services: Never    Marine scientist or Organizations: No    Attends Archivist Meetings: Never    Marital Status: Widowed    Tobacco Counseling Ready to quit: Not Answered Counseling given: Not Answered   Clinical Intake:  Pre-visit preparation completed: Yes  Pain : No/denies pain     Nutritional Risks: None Diabetes: No  How often do you need to have someone help you when you read instructions, pamphlets, or other written materials from your doctor or pharmacy?: 1 - Never  Diabetic?NO  Interpreter Needed?: No  Information entered by :: Kirke Shaggy, LPN   Activities of Daily Living    04/08/2022    1:45 PM 12/06/2021    9:49 AM  In your present state of health, do you have any difficulty performing the following activities:  Hearing? 0 0  Vision? 0 0  Difficulty concentrating or making decisions? 0 0  Walking or climbing stairs? 0 0  Dressing or bathing? 0 0  Doing errands, shopping? 0   Preparing Food and eating ? N   Using the Toilet? N   In the past six months, have you accidently leaked urine? N   Do you have problems with loss of bowel control? Y   Managing your Medications? N   Managing your Finances? N   Housekeeping or managing your Housekeeping? N     Patient Care Team: Gwyneth Sprout, FNP as PCP - General (Family Medicine)  Indicate any recent Medical Services you may have received from other than [facility] providers in the past year (date may be approximate).     Assessment:   This is a routine wellness examination for [facility].  Hearing/Vision screen Hearing Screening - Comments:: NO AIDS Vision Screening - Comments:: [facility] DR.KING  Dietary issues and exercise activities discussed: Current Exercise Habits: Home exercise routine, Type  of exercise: walking, Time (Minutes): 20, Frequency (Times/Week): 2, Weekly Exercise (Minutes/Week): 40, Intensity: Mild  Goals Addressed             This Visit's Progress    DIET - EAT MORE FRUITS AND VEGETABLES         Depression Screen    04/08/2022    1:42 PM 04/05/2021    9:08 AM 04/03/2020   10:08 AM 04/02/2019    9:09 AM 03/24/2018   10:06 AM 03/24/2018   10:05 AM 03/17/2017    9:11 AM  PHQ 2/9 Scores  PHQ - 2 Score 0 2 0 2 0 0 0  PHQ- 9 Score 0 2  4       Fall Risk    04/08/2022    1:44 PM 04/05/2021    9:08 AM 04/03/2020   10:08 AM 03/24/2018   10:06 AM 03/24/2018   10:05 AM  Fall Risk   Falls in the past year? 0 0 0 No No  Number falls in past yr: 0 0 0    Injury with Fall? 0 0 0    Risk for fall due to : No Fall Risks No Fall Risks No Fall Risks    Follow up Falls evaluation completed Falls evaluation completed Falls evaluation completed      [place]:  Any stairs in or around the home? No  If so, are there any without handrails? No  Home free of loose throw rugs in walkways, pet beds, electrical cords, etc? Yes  Adequate lighting in your home to reduce risk of falls? Yes   ASSISTIVE DEVICES UTILIZED TO PREVENT FALLS:  Life alert? No  Use of a cane, walker or w/c? No  Grab bars in the bathroom? No  Shower chair or bench in shower? Yes  Elevated toilet seat or a handicapped toilet? No    Cognitive Function:        04/08/2022    1:46 PM 04/05/2021    9:08 AM 04/03/2020   10:17 AM  6CIT Screen  What Year? 0 points 0 points 0 points  What month? 0 points 0 points 0 points  What time? 0 points 0 points 0 points  Count back from 20 0 points 0 points 0 points  Months in reverse 0 points 0 points 0 points  Repeat phrase 0 points 0 points 0 points  Total Score 0 points 0 points 0 points    Immunizations Immunization History  Administered Date(s) Administered   Fluad Quad(high Dose 65+) 07/17/2021   Influenza,inj,Quad PF,6+  Mos 05/19/2018   Influenza-Unspecified 06/14/2019   Pneumococcal Polysaccharide-23 03/10/2015, 04/03/2020   Tdap 03/17/2017   Zoster, Live 03/10/2015    TDAP status: Up to date  Flu Vaccine status: Up to date  Pneumococcal vaccine status: Up to date  Covid-19 vaccine status: Declined, Education has been provided regarding the importance of this vaccine but patient still declined. Advised may receive this vaccine at local pharmacy or Health Dept.or vaccine clinic. Aware to provide a copy of the vaccination record if obtained from local pharmacy or Health Dept. Verbalized acceptance and understanding.  Qualifies for Shingles Vaccine? Yes   Zostavax completed Yes   Shingrix Completed?: No.    Education has been provided regarding the importance of this vaccine. Patient has been advised to call insurance company to determine out of pocket expense if they have not yet received this vaccine. Advised may also receive vaccine at local pharmacy or Health Dept. Verbalized acceptance and understanding.  Screening Tests Health Maintenance  Topic Date Due  COVID-19 Vaccine (1) Never done   Zoster Vaccines- Shingrix (1 of 2) Never done   DEXA SCAN  Never done   Pneumonia Vaccine 63+ Years old (2 - PCV) 04/03/2021   MAMMOGRAM  05/24/2021   INFLUENZA VACCINE  04/02/2022   COLONOSCOPY (Pts 45-38yr Insurance coverage will need to be confirmed)  06/25/2026   TETANUS/TDAP  03/18/2027   Hepatitis C Screening  Completed   HPV VACCINES  Aged Out    Health Maintenance  Health Maintenance Due  Topic Date Due   COVID-19 Vaccine (1) Never done   Zoster Vaccines- Shingrix (1 of 2) Never done   DEXA SCAN  Never done   Pneumonia Vaccine 67 Years old (2 - PCV) 04/03/2021   MAMMOGRAM  05/24/2021   INFLUENZA VACCINE  04/02/2022    Colorectal cancer screening: Type of screening: Colonoscopy. Completed 06/25/21. Repeat every 5 years  Mammogram status: Ordered 07/20/21. Pt provided with contact info  and advised to call to schedule appt.   [facility], DECLINED REFERRAL  Lung Cancer Screening: (Low Dose CT Chest recommended if Age 59-80 years, 30 pack-year currently smoking OR have quit w/in 15years.) does not qualify.   Additional Screening:  Hepatitis C Screening: does qualify; Completed 03/17/17  Vision Screening: Recommended annual ophthalmology exams for early detection of glaucoma and other disorders of the eye. Is the patient up to date with their annual eye exam?  Yes  Who is the provider or what is the name of the office in which the patient attends annual eye exams? [facility] If pt is not established with a provider, would they like to be referred to a provider to establish care? No .   Dental Screening: Recommended annual dental exams for proper oral hygiene  Community Resource Referral / Chronic Care Management: CRR required this visit?  No   CCM required this visit?  No      Plan:     I have personally reviewed and noted the following in the patient's chart:   Medical and social history Use of alcohol, tobacco or illicit drugs  Current medications and supplements including opioid prescriptions.  Functional ability and status Nutritional status Physical activity Advanced directives List of other physicians Hospitalizations, surgeries, and ER visits in previous 12 months Vitals Screenings to include cognitive, depression, and falls Referrals and appointments  In addition, I have reviewed and discussed with patient certain preventive protocols, quality metrics, and best practice recommendations. A written personalized care plan for preventive services as well as general preventive health recommendations were provided to patient.     LDionisio David LPN   [id number]  Nurse Notes: NMarlynn Perking

## 2022-04-08 NOTE — Patient Instructions (Signed)
Ms. Alexandria [name] , Thank you for taking time to come for your Medicare Wellness Visit. I appreciate your ongoing commitment to your health goals. Please review the following plan we discussed and let me know if I can assist you in the future.   Screening recommendations/referrals: Colonoscopy: 06/25/21 Mammogram: ORDERED 07/20/21 Bone Density: DECLINED REFERRAL Recommended yearly ophthalmology/optometry visit for glaucoma screening and checkup Recommended yearly dental visit for hygiene and checkup  Vaccinations: Influenza vaccine: 07/17/21 Pneumococcal vaccine: 04/03/20 Tdap vaccine: 03/17/17 Shingles vaccine: ZOSTAVAX 03/10/15   Covid-19:N/D  Advanced directives: NO  Conditions/risks identified: NONE  Next appointment: Follow up in one year for your annual wellness visit-  04/10/23 @ 11AM BY PHONE   Preventive Care 65 Years and Older, Female Preventive care refers to lifestyle choices and visits with your health care provider that can promote health and wellness. What does preventive care include? A yearly physical exam. This is also called an annual well check. Dental exams once or twice a year. Routine eye exams. Ask your health care provider how often you should have your eyes checked. Personal lifestyle choices, including: Daily care of your teeth and gums. Regular physical activity. Eating a healthy diet. Avoiding tobacco and drug use. Limiting alcohol use. Practicing safe sex. Taking low-dose aspirin every day. Taking vitamin and mineral supplements as recommended by your health care provider. What happens during an annual well check? The services and screenings done by your health care provider during your annual well check will depend on your age, overall health, lifestyle risk factors, and family history of disease. Counseling  Your health care provider may ask you questions about your: Alcohol use. Tobacco use. Drug use. Emotional well-being. Home and relationship  well-being. Sexual activity. Eating habits. History of falls. Memory and ability to understand (cognition). Work and work Statistician. Reproductive health. Screening  You may have the following tests or measurements: Height, weight, and BMI. Blood pressure. Lipid and cholesterol levels. These may be checked every 5 years, or more frequently if you are over 69 years old. Skin check. Lung cancer screening. You may have this screening every year starting at age 17 if you have a 30-pack-year history of smoking and currently smoke or have quit within the past 15 years. Fecal occult blood test (FOBT) of the stool. You may have this test every year starting at age 75. Flexible sigmoidoscopy or colonoscopy. You may have a sigmoidoscopy every 5 years or a colonoscopy every 10 years starting at age 42. Hepatitis C blood test. Hepatitis B blood test. Sexually transmitted disease (STD) testing. Diabetes screening. This is done by checking your blood sugar (glucose) after you have not eaten for a while (fasting). You may have this done every 1-3 years. Bone density scan. This is done to screen for osteoporosis. You may have this done starting at age 21. Mammogram. This may be done every 1-2 years. Talk to your health care provider about how often you should have regular mammograms. Talk with your health care provider about your test results, treatment options, and if necessary, the need for more tests. Vaccines  Your health care provider may recommend certain vaccines, such as: Influenza vaccine. This is recommended every year. Tetanus, diphtheria, and acellular pertussis (Tdap, Td) vaccine. You may need a Td booster every 10 years. Zoster vaccine. You may need this after age 69. Pneumococcal 13-valent conjugate (PCV13) vaccine. One dose is recommended after age 46. Pneumococcal polysaccharide (PPSV23) vaccine. One dose is recommended after age 25. Talk to your health  care provider about which  screenings and vaccines you need and how often you need them. This information is not intended to replace advice given to you by your health care provider. Make sure you discuss any questions you have with your health care provider. Document Released: 09/15/2015 Document Revised: 05/08/2016 Document Reviewed: 06/20/2015 Elsevier Interactive Patient Education  2017 [place] Prevention in the Home Falls can cause injuries. They can happen to people of all ages. There are many things you can do to make your home safe and to help prevent falls. What can I do on the outside of my home? Regularly fix the edges of walkways and driveways and fix any cracks. Remove anything that might make you trip as you walk through a door, such as a raised step or threshold. Trim any bushes or trees on the path to your home. Use bright outdoor lighting. Clear any walking paths of anything that might make someone trip, such as rocks or tools. Regularly check to see if handrails are loose or broken. Make sure that both sides of any steps have handrails. Any raised decks and porches should have guardrails on the edges. Have any leaves, snow, or ice cleared regularly. Use sand or salt on walking paths during winter. Clean up any spills in your garage right away. This includes oil or grease spills. What can I do in the bathroom? Use night lights. Install grab bars by the toilet and in the tub and shower. Do not use towel bars as grab bars. Use non-skid mats or decals in the tub or shower. If you need to sit down in the shower, use a plastic, non-slip stool. Keep the floor dry. Clean up any water that spills on the floor as soon as it happens. Remove soap buildup in the tub or shower regularly. Attach bath mats securely with double-sided non-slip rug tape. Do not have throw rugs and other things on the floor that can make you trip. What can I do in the bedroom? Use night lights. Make sure that you have a  light by your bed that is easy to reach. Do not use any sheets or blankets that are too big for your bed. They should not hang down onto the floor. Have a firm chair that has side arms. You can use this for support while you get dressed. Do not have throw rugs and other things on the floor that can make you trip. What can I do in the kitchen? Clean up any spills right away. Avoid walking on wet floors. Keep items that you use a lot in easy-to-reach places. If you need to reach something above you, use a strong step stool that has a grab bar. Keep electrical cords out of the way. Do not use floor polish or wax that makes floors slippery. If you must use wax, use non-skid floor wax. Do not have throw rugs and other things on the floor that can make you trip. What can I do with my stairs? Do not leave any items on the stairs. Make sure that there are handrails on both sides of the stairs and use them. Fix handrails that are broken or loose. Make sure that handrails are as long as the stairways. Check any carpeting to make sure that it is firmly attached to the stairs. Fix any carpet that is loose or worn. Avoid having throw rugs at the top or bottom of the stairs. If you do have throw rugs, attach them to  the floor with carpet tape. Make sure that you have a light switch at the top of the stairs and the bottom of the stairs. If you do not have them, ask someone to add them for you. What else can I do to help prevent falls? Wear shoes that: Do not have high heels. Have rubber bottoms. Are comfortable and fit you well. Are closed at the toe. Do not wear sandals. If you use a stepladder: Make sure that it is fully opened. Do not climb a closed stepladder. Make sure that both sides of the stepladder are locked into place. Ask someone to hold it for you, if possible. Clearly mark and make sure that you can see: Any grab bars or handrails. First and last steps. Where the edge of each step  is. Use tools that help you move around (mobility aids) if they are needed. These include: Canes. Walkers. Scooters. Crutches. Turn on the lights when you go into a dark area. Replace any light bulbs as soon as they burn out. Set up your furniture so you have a clear path. Avoid moving your furniture around. If any of your floors are uneven, fix them. If there are any pets around you, be aware of where they are. Review your medicines with your doctor. Some medicines can make you feel dizzy. This can increase your chance of falling. Ask your doctor what other things that you can do to help prevent falls. This information is not intended to replace advice given to you by your health care provider. Make sure you discuss any questions you have with your health care provider. Document Released: 06/15/2009 Document Revised: 01/25/2016 Document Reviewed: 09/23/2014 Elsevier Interactive Patient Education  2017 [employer].

## 2022-04-10 NOTE — Progress Notes (Unsigned)
Complete physical exam  Patient: Alexandria [name]   DOB: 01-Sep-1956   66 y.o. Female  MRN: [id number] Visit Date: 04/11/2022  Today's healthcare provider: Gwyneth Sprout, FNP  Introduced to nurse practitioner role and practice setting.  All questions answered.  Discussed provider/patient relationship and expectations.  I,Mikal Blasdell J Angenette Daily,acting as a scribe for Gwyneth Sprout, FNP.,have documented all relevant documentation on the behalf of Gwyneth Sprout, FNP,as directed by  Gwyneth Sprout, FNP while in the presence of Gwyneth Sprout, FNP.  Chief Complaint  Patient presents with   Annual Exam   Subjective    Alexandria [name] is a 66 y.o. female who presents today for a complete physical exam.  She reports consuming a low sugar diet.  Exercise is walking and swimming, yardwork when she feels up to it.  She generally feels well. She reports sleeping well. She does not have additional problems to discuss today.   HPI   Past Medical History:  Diagnosis Date   Anxiety    Carbuncle and furuncle of trunk    HTN (hypertension)    Hyperlipidemia    Intrinsic asthma, unspecified    Methicillin resistant Staphylococcus aureus in conditions classified elsewhere and of unspecified site    Other psychological or physical stress, not elsewhere classified(V62.89)    Pre-diabetes    Tobacco abuse    Past Surgical History:  Procedure Laterality Date   COLONOSCOPY WITH PROPOFOL N/A 04/10/2015   Procedure: COLONOSCOPY WITH PROPOFOL;  Surgeon: Lucilla Lame, MD;  Location: [place];  Service: Endoscopy;  Laterality: N/A;   COLONOSCOPY WITH PROPOFOL N/A 06/25/2021   Procedure: COLONOSCOPY WITH PROPOFOL;  Surgeon: Lucilla Lame, MD;  Location: [place];  Service: Endoscopy;  Laterality: N/A;  leave at 8 arrival   ESOPHAGOGASTRODUODENOSCOPY (EGD) WITH PROPOFOL N/A 12/06/2021   Procedure: ESOPHAGOGASTRODUODENOSCOPY (EGD) WITH PROPOFOL;  Surgeon: Lucilla Lame, MD;  Location: [place];  Service:  Endoscopy;  Laterality: N/A;   POLYPECTOMY  04/10/2015   Procedure: POLYPECTOMY;  Surgeon: Lucilla Lame, MD;  Location: [place];  Service: Endoscopy;;   POLYPECTOMY N/A 06/25/2021   Procedure: POLYPECTOMY;  Surgeon: Lucilla Lame, MD;  Location: [place];  Service: Endoscopy;  Laterality: N/A;   STATUS POST ENDOMETRIAL ABLATION     TONSILLECTOMY     TUBAL LIGATION     bilateral, sterile ablation   VARICOSE VEINS     Social History   Socioeconomic History   Marital status: Married    Spouse name: Remo Lipps   Number of children: 3   Years of education: 12   Highest education level: Not on file  Occupational History   Occupation: [employer]    Comment: CNA  Tobacco Use   Smoking status: Every Day    Packs/day: 0.50    Years: 30.00    Total pack years: 15.00    Types: Cigarettes    Last attempt to quit: 01/31/2017    Years since quitting: 5.1   Smokeless tobacco: Never  Vaping Use   Vaping Use: Never used  Substance and Sexual Activity   Alcohol use: No    Alcohol/week: 0.0 standard drinks of alcohol   Drug use: No   Sexual activity: Not on file  Other Topics Concern   Not on file  Social History Narrative   Not on file   Social Determinants of Health   Financial Resource Strain: Low Risk  (04/08/2022)   Overall Emergency planning/management officer Strain (  CARDIA)    Difficulty of Paying Living Expenses: Not hard at all  Food Insecurity: No Food Insecurity (04/08/2022)   Hunger Vital Sign    Worried About Running Out of Food in the Last Year: Never true    Ran Out of Food in the Last Year: Never true  Transportation Needs: No Transportation Needs (04/08/2022)   PRAPARE - Hydrologist (Medical): No    Lack of Transportation (Non-Medical): No  Physical Activity: Insufficiently Active (04/08/2022)   Exercise Vital Sign    Days of Exercise per Week: 2 days    Minutes of Exercise per Session: 20 min  Stress: No Stress Concern Present  (04/08/2022)   [place]    Feeling of Stress : Not at all  Social Connections: Socially Isolated (04/08/2022)   Social Connection and Isolation Panel [NHANES]    Frequency of Communication with Friends and Family: More than three times a week    Frequency of Social Gatherings with Friends and Family: Once a week    Attends Religious Services: Never    Marine scientist or Organizations: No    Attends Archivist Meetings: Never    Marital Status: Widowed  Intimate Partner Violence: Not At Risk (04/08/2022)   Humiliation, Afraid, Rape, and Kick questionnaire    Fear of Current or Ex-Partner: No    Emotionally Abused: No    Physically Abused: No    Sexually Abused: No   Family Status  Relation Name Status   Mother  Deceased   Father  Deceased at age 21   Sister  [place]   Sister  [place]   Daughter  [place] HYPERTENSION   Daughter  Alive       OBESE AND HYPERTENSION   Daughter  [place]   Sister  (Not Specified)   Daughter  (Not Specified)   Daughter  (Not Specified)   Daughter  (Not Specified)   Daughter  (Not Specified)   Neg Hx  (Not Specified)   Family History  Problem Relation Age of Onset   Coronary artery disease Mother    Hypertension Mother    Rheum arthritis Mother    Heart attack Mother    Lung cancer Father    Emphysema Father    Gout Father    Diabetes Sister    Hypertension Daughter    Hypertension Daughter    Obesity Daughter    Seizures Daughter    Breast cancer Neg Hx    Bladder Cancer Neg Hx    Kidney cancer Neg Hx    Allergies  Allergen Reactions   Pravastatin Sodium     Myalgia    Patient Care Team: Gwyneth Sprout, FNP as PCP - General (Family Medicine)   Medications: Outpatient Medications Prior to Visit  Medication Sig   Albuterol Sulfate (PROAIR RESPICLICK) 778  (90 Base) MCG/ACT AEPB Inhale 2 puffs into the lungs 4 (four) times daily as needed.   amLODipine (NORVASC) 10 MG tablet Take 1 tablet (10 mg total) by mouth daily.   atorvastatin (LIPITOR) 20 MG tablet Take 1 tablet (20 mg total) by mouth daily.   cetirizine (ZYRTEC) 10 MG tablet Take 1 tablet (10 mg total)  by mouth daily.   clonazePAM (KLONOPIN) 0.5 MG tablet TAKE ONE-HALF TABLET BY MOUTH 3  TIMES DAILY AS NEEDED FOR  ANXIETY   famotidine (PEPCID) 20 MG tablet Take 20 mg by mouth 2 (two) times daily.   Fluticasone-Umeclidin-Vilant (TRELEGY ELLIPTA) 100-62.5-25 MCG/ACT AEPB Inhale 1 puff into the lungs daily.   hydrochlorothiazide (HYDRODIURIL) 25 MG tablet Take 1 tablet (25 mg total) by mouth daily.   loperamide (IMODIUM) 2 MG capsule Take 1 capsule (2 mg total) by mouth 4 (four) times daily as needed for diarrhea or loose stools.   metFORMIN (GLUCOPHAGE-XR) 750 MG 24 hr tablet Take 750 mg by mouth daily with supper.   MULTIPLE VITAMIN PO Take 1 tablet by mouth daily. Reported on 03/13/2016   [DISCONTINUED] clarithromycin (BIAXIN) 500 MG tablet Take 1 tablet (500 mg total) by mouth 2 (two) times daily.   [DISCONTINUED] amoxicillin (AMOXIL) 500 MG capsule Take 1,000 mg by mouth 2 (two) times daily.   [DISCONTINUED] amoxicillin (AMOXIL) 500 MG tablet Take 2 tablets (1,000 mg total) by mouth 2 (two) times daily.   [DISCONTINUED] nystatin (MYCOSTATIN) 100000 UNIT/ML suspension TAKE 5ML BY MOUTH 4 TIMES DAILY   [DISCONTINUED] pantoprazole (PROTONIX) 20 MG tablet Take 1 tablet (20 mg total) by mouth 2 (two) times daily.   [DISCONTINUED] simethicone (MYLICON) 80 MG chewable tablet Chew 1 tablet (80 mg total) by mouth every 6 (six) hours as needed for flatulence.   No facility-administered medications prior to visit.   Review of Systems  Last CBC Lab Results  Component Value Date   WBC 11.8 (H) 11/21/2021   HGB 15.9 11/21/2021   HCT 46.2 11/21/2021   MCV 87 11/21/2021   MCH 29.9 11/21/2021    RDW 12.8 11/21/2021   PLT 334 02/58/5277   Last metabolic panel Lab Results  Component Value Date   GLUCOSE 132 (H) 04/05/2021   NA 140 04/05/2021   K 4.2 04/05/2021   CL 97 04/05/2021   CO2 26 04/05/2021   BUN 9 04/05/2021   CREATININE 0.84 04/05/2021   EGFR 77 04/05/2021   CALCIUM 10.5 (H) 04/05/2021   PROT 6.7 04/05/2021   ALBUMIN 4.4 04/05/2021   LABGLOB 2.3 04/05/2021   AGRATIO 1.9 04/05/2021   BILITOT 0.4 04/05/2021   ALKPHOS 130 (H) 04/05/2021   AST 22 04/05/2021   ALT 30 04/05/2021   ANIONGAP 10 06/17/2015   Last lipids Lab Results  Component Value Date   CHOL 173 04/05/2021   HDL 43 04/05/2021   LDLCALC 107 (H) 04/05/2021   TRIG 127 04/05/2021   CHOLHDL 4.0 04/05/2021   Last hemoglobin A1c Lab Results  Component Value Date   HGBA1C 6.8 (A) 07/17/2021   Last thyroid functions Lab Results  Component Value Date   TSH 1.960 04/05/2021    Objective     BP 135/80 (BP Location: Left Arm, Patient Position: Sitting, Cuff Size: Normal)   Pulse 98   Temp 97.7 F (36.5 C) (Oral)   Resp 16   Ht _0  (1.702 m)   Wt 175 lb (79.4 kg)   SpO2 98%   BMI 27.41 kg/m   BP Readings from Last 3 Encounters:  04/11/22 135/80  12/10/21 134/76  12/06/21 127/69   Wt Readings from Last 3 Encounters:  04/11/22 175 lb (79.4 kg)  04/08/22 189 lb (85.7 kg)  12/10/21 189 lb 13.1 oz (86.1 kg)   SpO2 Readings from Last 3 Encounters:  04/11/22 98%  12/10/21 93%  12/06/21 94%  Physical Exam Vitals and nursing note reviewed.  Constitutional:      General: She is awake. She is not in acute distress.    Appearance: Normal appearance. She is well-developed, well-groomed and overweight. She is not ill-appearing, toxic-appearing or diaphoretic.  HENT:     Head: Normocephalic and atraumatic.     Jaw: There is normal jaw occlusion. No trismus, tenderness, swelling or pain on movement.     Right Ear: Hearing, tympanic membrane, ear canal and external ear normal. There is  no impacted cerumen.     Left Ear: Hearing, tympanic membrane, ear canal and external ear normal. There is no impacted cerumen.     Nose: Nose normal. No congestion or rhinorrhea.     Right Turbinates: Not enlarged, swollen or pale.     Left Turbinates: Not enlarged, swollen or pale.     Right Sinus: No maxillary sinus tenderness or frontal sinus tenderness.     Left Sinus: No maxillary sinus tenderness or frontal sinus tenderness.     Mouth/Throat:     Lips: Pink.     Mouth: Mucous membranes are moist. No injury.     Tongue: No lesions.     Pharynx: Oropharynx is clear. Uvula midline. No pharyngeal swelling, oropharyngeal exudate, posterior oropharyngeal erythema or uvula swelling.     Tonsils: No tonsillar exudate or tonsillar abscesses.     Comments: Poor dentition; brushes gums. Not UTD on dental.  Eyes:     General: Lids are normal. Lids are everted, no foreign bodies appreciated. Vision grossly intact. Gaze aligned appropriately. No allergic shiner or visual field deficit.       Right eye: No discharge.        Left eye: No discharge.     Extraocular Movements: Extraocular movements intact.     Conjunctiva/sclera: Conjunctivae normal.     Right eye: Right conjunctiva is not injected. No exudate.    Left eye: Left conjunctiva is not injected. No exudate.    Pupils: Pupils are equal, round, and reactive to light.  Neck:     Thyroid: No thyroid mass, thyromegaly or thyroid tenderness.     Vascular: No carotid bruit.     Trachea: Trachea normal.  Cardiovascular:     Rate and Rhythm: Normal rate and regular rhythm.     Pulses: Normal pulses.          Carotid pulses are 2+ on the right side and 2+ on the left side.      Radial pulses are 2+ on the right side and 2+ on the left side.       Dorsalis pedis pulses are 2+ on the right side and 2+ on the left side.       Posterior tibial pulses are 2+ on the right side and 2+ on the left side.     Heart sounds: Normal heart sounds, S1  normal and S2 normal. No murmur heard.    No friction rub. No gallop.  Pulmonary:     Effort: Pulmonary effort is normal. No respiratory distress.     Breath sounds: Normal breath sounds and air entry. No stridor. No wheezing, rhonchi or rales.  Chest:     Chest wall: No tenderness.     Comments: Breasts: breasts appear normal, no suspicious masses, no skin or nipple changes or axillary nodes, risk and benefit of breast self-exam was discussed, not examined, patient declines to have breast exam; due for mammogram- information provided on how to call and  schedule exam  Abdominal:     General: Abdomen is flat. Bowel sounds are normal. There is no distension.     Palpations: Abdomen is soft. There is no mass.     Tenderness: There is no abdominal tenderness. There is no right CVA tenderness, left CVA tenderness, guarding or rebound.     Hernia: No hernia is present.  Genitourinary:    Comments: Exam deferred; denies complaints Musculoskeletal:        General: No swelling, tenderness, deformity or signs of injury. Normal range of motion.     Cervical back: Full passive range of motion without pain, normal range of motion and neck supple. No edema, rigidity or tenderness. No muscular tenderness.     Right lower leg: No edema.     Left lower leg: No edema.  Lymphadenopathy:     Cervical: No cervical adenopathy.     Right cervical: No superficial, deep or posterior cervical adenopathy.    Left cervical: No superficial, deep or posterior cervical adenopathy.  Skin:    General: Skin is warm and dry.     Capillary Refill: Capillary refill takes less than 2 seconds.     Coloration: Skin is not jaundiced or pale.     Findings: No bruising, erythema, lesion or rash.  Neurological:     General: No focal deficit present.     Mental Status: She is alert and oriented to person, place, and time. Mental status is at baseline.     GCS: GCS eye subscore is 4. GCS verbal subscore is 5. GCS motor subscore  is 6.     Sensory: Sensation is intact. No sensory deficit.     Motor: Motor function is intact. No weakness.     Coordination: Coordination is intact. Coordination normal.     Gait: Gait is intact. Gait normal.  Psychiatric:        Attention and Perception: Attention and perception normal.        Mood and Affect: Mood and affect normal.        Speech: Speech normal.        Behavior: Behavior normal. Behavior is cooperative.        Thought Content: Thought content normal.        Cognition and Memory: Cognition and memory normal.        Judgment: Judgment normal.     Last depression screening scores    04/08/2022    1:42 PM 04/05/2021    9:08 AM 04/03/2020   10:08 AM  PHQ 2/9 Scores  PHQ - 2 Score 0 2 0  PHQ- 9 Score 0 2    Last fall risk screening    04/08/2022    1:44 PM  [place] in the past year? 0  Number falls in past yr: 0  Injury with Fall? 0  Risk for fall due to : No Fall Risks  Follow up Falls evaluation completed   Last Audit-C alcohol use screening    04/08/2022    1:42 PM  Alcohol Use Disorder Test (AUDIT)  1. How often do you have a drink containing alcohol? 0  2. How many drinks containing alcohol do you have on a typical day when you are drinking? 0  3. How often do you have six or more drinks on one occasion? 0  AUDIT-C Score 0   A score of 3 or more in women, and 4 or more in men indicates increased risk for alcohol abuse,  EXCEPT if all of the points are from question 1   No results found for any visits on 04/11/22.  Assessment & Plan    Routine Health Maintenance and Physical Exam  Exercise Activities and Dietary recommendations  Goals      DIET - EAT MORE FRUITS AND VEGETABLES        Immunization History  Administered Date(s) Administered   Fluad Quad(high Dose 65+) 07/17/2021   Influenza,inj,Quad PF,6+ Mos 05/19/2018   Influenza-Unspecified 06/14/2019   PNEUMOCOCCAL CONJUGATE-20 04/11/2022   Pneumococcal Polysaccharide-23  03/10/2015, 04/03/2020   Tdap 03/17/2017   Zoster, Live 03/10/2015    Health Maintenance  Topic Date Due   COVID-19 Vaccine (1) Never done   URINE MICROALBUMIN  Never done   Zoster Vaccines- Shingrix (1 of 2) Never done   DEXA SCAN  Never done   MAMMOGRAM  05/24/2021   HEMOGLOBIN A1C  01/14/2022   INFLUENZA VACCINE  04/02/2022   FOOT EXAM  04/05/2022   OPHTHALMOLOGY EXAM  04/24/2022   COLONOSCOPY (Pts 45-61yr Insurance coverage will need to be confirmed)  06/25/2026   TETANUS/TDAP  03/18/2027   Pneumonia Vaccine 66 Years old  Completed   Hepatitis C Screening  Completed   HPV VACCINES  Aged Out    Discussed health benefits of physical activity, and encouraged her to engage in regular exercise appropriate for her age and condition.  Problem List Items Addressed This Visit       Cardiovascular and Mediastinum   Hypertension associated with diabetes ([place]    Chronic, borderline Goal <130/<80 Encouraged to stop/reduce tobacco intake Continue norvasc 10 mg, HCTZ 25 mg      Relevant Orders   Comprehensive metabolic panel     Respiratory   Chronic obstructive pulmonary disease (HCC)    Chronic, stable Denies recent exacerbation Not on controller inhalers Encouraged to stop/reduce tobacco intake         Endocrine   Hyperlipidemia associated with type 2 diabetes mellitus (HCC)    Chronic, uncontrolled Last LDL of 107 Repeat LP I recommend diet low in saturated fat and regular exercise - 30 min at least 5 times per week       Relevant Orders   Lipid panel     Other   Annual physical exam - Primary    UTD on vision Due for dental Things to do to keep yourself healthy  - Exercise at least 30-45 minutes a day, 3-4 days a week.  - Eat a low-fat diet with lots of fruits and vegetables, up to 7-9 servings per day.  - Seatbelts can save your life. Wear them always.  - Smoke detectors on every level of your home, check batteries every year.  - Eye Doctor - have an  eye exam every 1-2 years  - Safe sex - if you may be exposed to STDs, use a condom.  - Alcohol -  If you drink, do it moderately, less than 2 drinks per day.  - [place] Choose someone to speak for you if you are not able.  - Depression is common in our stressful world.If you're feeling down or losing interest in things you normally enjoy, please come in for a visit.  - Violence - If anyone is threatening or hurting you, please call immediately.        Relevant Orders   Comprehensive metabolic panel   CBC with Differential/Platelet   TSH + free T4   Lipid panel   Hemoglobin  A1c   Need for pneumococcal 20-valent conjugate vaccination    Last dose provided; VIS made available, consent received       Relevant Orders   Pneumococcal conjugate vaccine 20-valent (Prevnar 20) (Completed)   Return in about 6 months (around 10/12/2022) for chonic disease management.    Vonna Kotyk, FNP, have reviewed all documentation for this visit. The documentation on 04/11/22 for the exam, diagnosis, procedures, and orders are all accurate and complete.  Gwyneth Sprout, [place] [phone] (phone) [phone] (fax)  [place]

## 2022-04-11 ENCOUNTER — Ambulatory Visit (INDEPENDENT_AMBULATORY_CARE_PROVIDER_SITE_OTHER): Payer: Medicare Other | Admitting: Family Medicine

## 2022-04-11 ENCOUNTER — Encounter: Payer: Self-pay | Admitting: Family Medicine

## 2022-04-11 VITALS — BP 135/80 | HR 98 | Temp 97.7°F | Resp 16 | Ht 67.0 in | Wt 175.0 lb

## 2022-04-11 DIAGNOSIS — E785 Hyperlipidemia, unspecified: Secondary | ICD-10-CM | POA: Diagnosis not present

## 2022-04-11 DIAGNOSIS — Z23 Encounter for immunization: Secondary | ICD-10-CM

## 2022-04-11 DIAGNOSIS — E1159 Type 2 diabetes mellitus with other circulatory complications: Secondary | ICD-10-CM | POA: Diagnosis not present

## 2022-04-11 DIAGNOSIS — J449 Chronic obstructive pulmonary disease, unspecified: Secondary | ICD-10-CM | POA: Diagnosis not present

## 2022-04-11 DIAGNOSIS — E1169 Type 2 diabetes mellitus with other specified complication: Secondary | ICD-10-CM

## 2022-04-11 DIAGNOSIS — I152 Hypertension secondary to endocrine disorders: Secondary | ICD-10-CM | POA: Diagnosis not present

## 2022-04-11 DIAGNOSIS — Z0000 Encounter for general adult medical examination without abnormal findings: Secondary | ICD-10-CM | POA: Insufficient documentation

## 2022-04-11 NOTE — Patient Instructions (Signed)
The CDC recommends two doses of Shingrix (the new shingles vaccine) separated by 2 to 6 months for adults age 67 years and older. I recommend checking with your insurance plan regarding coverage for this vaccine.    

## 2022-04-11 NOTE — Assessment & Plan Note (Signed)
Chronic, borderline Goal <130/<80 Encouraged to stop/reduce tobacco intake Continue norvasc 10 mg, HCTZ 25 mg

## 2022-04-11 NOTE — Assessment & Plan Note (Signed)
Chronic, stable Denies recent exacerbation Not on controller inhalers Encouraged to stop/reduce tobacco intake

## 2022-04-11 NOTE — Assessment & Plan Note (Signed)
Chronic, uncontrolled Last LDL of 107 Repeat LP I recommend diet low in saturated fat and regular exercise - 30 min at least 5 times per week

## 2022-04-11 NOTE — Assessment & Plan Note (Signed)
UTD on vision Due for dental Things to do to keep yourself healthy  - Exercise at least 30-45 minutes a day, 3-4 days a week.  - Eat a low-fat diet with lots of fruits and vegetables, up to 7-9 servings per day.  - Seatbelts can save your life. Wear them always.  - Smoke detectors on every level of your home, check batteries every year.  - Eye Doctor - have an eye exam every 1-2 years  - Safe sex - if you may be exposed to STDs, use a condom.  - Alcohol -  If you drink, do it moderately, less than 2 drinks per day.  - [place]. Choose someone to speak for you if you are not able.  - Depression is common in our stressful world.If you're feeling down or losing interest in things you normally enjoy, please come in for a visit.  - Violence - If anyone is threatening or hurting you, please call immediately.

## 2022-04-11 NOTE — Assessment & Plan Note (Signed)
Last dose provided; VIS made available, consent received

## 2022-04-12 ENCOUNTER — Other Ambulatory Visit: Payer: Self-pay | Admitting: Family Medicine

## 2022-04-12 LAB — COMPREHENSIVE METABOLIC PANEL
ALT: 21 IU/L (ref 0–32)
AST: 17 IU/L (ref 0–40)
Albumin/Globulin Ratio: 2.1 (ref 1.2–2.2)
Albumin: 4.6 g/dL (ref 3.9–4.9)
Alkaline Phosphatase: 135 IU/L — ABNORMAL HIGH (ref 44–121)
BUN/Creatinine Ratio: 9 — ABNORMAL LOW (ref 12–28)
BUN: 6 mg/dL — ABNORMAL LOW (ref 8–27)
Bilirubin Total: 0.5 mg/dL (ref 0.0–1.2)
CO2: 27 mmol/L (ref 20–29)
Calcium: 10.3 mg/dL (ref 8.7–10.3)
Chloride: 97 mmol/L (ref 96–106)
Creatinine, Ser: 0.69 mg/dL (ref 0.57–1.00)
Globulin, Total: 2.2 g/dL (ref 1.5–4.5)
Glucose: 103 mg/dL — ABNORMAL HIGH (ref 70–99)
Potassium: 4.1 mmol/L (ref 3.5–5.2)
Sodium: 140 mmol/L (ref 134–144)
Total Protein: 6.8 g/dL (ref 6.0–8.5)
eGFR: 95 mL/min/{1.73_m2} (ref >59)

## 2022-04-12 LAB — HEMOGLOBIN A1C
Est. average glucose Bld gHb Est-mCnc: 128 mg/dL
Hgb A1c MFr Bld: 6.1 % — ABNORMAL HIGH (ref 4.8–5.6)

## 2022-04-12 LAB — LIPID PANEL
Chol/HDL Ratio: 3.9 ratio (ref 0.0–4.4)
Cholesterol, Total: 150 mg/dL (ref 100–199)
HDL: 38 mg/dL — ABNORMAL LOW (ref >39)
LDL Chol Calc (NIH): 88 mg/dL (ref 0–99)
Triglycerides: 137 mg/dL (ref 0–149)
VLDL Cholesterol Cal: 24 mg/dL (ref 5–40)

## 2022-04-12 LAB — CBC WITH DIFFERENTIAL/PLATELET
Basophils Absolute: 0.1 10*3/uL (ref 0.0–0.2)
Basos: 1 %
EOS (ABSOLUTE): 0.2 10*3/uL (ref 0.0–0.4)
Eos: 2 %
Hematocrit: 46.7 % — ABNORMAL HIGH (ref 34.0–46.6)
Hemoglobin: 15.7 g/dL (ref 11.1–15.9)
Immature Grans (Abs): 0 10*3/uL (ref 0.0–0.1)
Immature Granulocytes: 0 %
Lymphocytes Absolute: 3.3 10*3/uL — ABNORMAL HIGH (ref 0.7–3.1)
Lymphs: 36 %
MCH: 29.4 pg (ref 26.6–33.0)
MCHC: 33.6 g/dL (ref 31.5–35.7)
MCV: 88 fL (ref 79–97)
Monocytes Absolute: 0.5 10*3/uL (ref 0.1–0.9)
Monocytes: 6 %
Neutrophils Absolute: 5 10*3/uL (ref 1.4–7.0)
Neutrophils: 55 %
Platelets: 318 10*3/uL (ref 150–450)
RBC: 5.34 x10E6/uL — ABNORMAL HIGH (ref 3.77–5.28)
RDW: 13.3 % (ref 11.7–15.4)
WBC: 9.1 10*3/uL (ref 3.4–10.8)

## 2022-04-12 LAB — TSH+FREE T4
Free T4: 1.38 ng/dL (ref 0.82–1.77)
TSH: 1.57 u[IU]/mL (ref 0.450–4.500)

## 2022-04-12 MED ORDER — ROSUVASTATIN CALCIUM 40 MG PO TABS: 40.0000 mg | ORAL_TABLET | Freq: Every day | ORAL | 3 refills | Status: DC

## 2022-04-12 NOTE — Progress Notes (Signed)
Stable blood chemistry; alkaline phosphatase remains. However, normal AST and ALT, liver enzymes.  Cholesterol improved; LDL is now 88. HDL is borderline low. The 10-year ASCVD risk score (Arnett DK, et al., 2019) is: 30.8%   Values used to calculate the score:     Age: 66 years     Sex: Female     Is Non-Hispanic African American: No     Diabetic: Yes     Tobacco smoker: Yes     Systolic Blood Pressure: 921 mmHg     Is BP treated: Yes     HDL Cholesterol: 38 mg/dL     Total Cholesterol: 150 mg/dL Heart attack and stroke risk is 31% estimated within the next 10 years which is very high. Recommend increase in statin dose in addition to working on reducing tobacco intake.  A1c improved; now 6.1%. Continue to recommend balanced, lower carb meals. Smaller meal size, adding snacks. Choosing water as drink of choice and increasing purposeful exercise.  Gwyneth Sprout, [place] [address] [place], [place] [address] [phone] (phone) [phone] (fax) [place]

## 2022-04-22 ENCOUNTER — Other Ambulatory Visit: Payer: Self-pay | Admitting: Family Medicine

## 2022-04-22 ENCOUNTER — Ambulatory Visit: Payer: Self-pay

## 2022-04-22 DIAGNOSIS — Z1231 Encounter for screening mammogram for malignant neoplasm of breast: Secondary | ICD-10-CM

## 2022-04-22 NOTE — Patient Instructions (Signed)
Visit Information  Thank you for taking time to visit with me today. Please don't hesitate to contact me if I can be of assistance to you.   Following are the goals we discussed today:   Goals Addressed             This Visit's Progress    RNCM: Effective Management of DM       Care Coordination Interventions:  Lab Results  Component Value Date   HGBA1C 6.1 (H) 04/11/2022    Provided education to patient about basic DM disease process. 04-22-2022: The patient wants to maintain control of her DM. She does not wish to be on "a shot". She wants to learn what she can so she will not have to have insulin. Her husband who is now deceased had to take insulin. He passed away in 2020/09/20. She has lost weight since her physical last year and she is watching what she eats.  Reviewed medications with patient and discussed importance of medication adherence. 04-22-2022: The patient is adherent to her medications Counseled on importance of regular laboratory monitoring as prescribed. 04-22-2022: Has regular labwork. Is compliant with A1C testing Discussed plans with patient for ongoing care management follow up and provided patient with direct contact information for care management team Provided patient with written educational materials related to hypo and hyperglycemia and importance of correct treatment. 04-22-2022: Review of the sx and sx of hypo and hyperglycemia Reviewed scheduled/upcoming provider appointments including: patient saw the pcp on 04-11-2022, knows to call for changes or new needs Review of patient status, including review of consultants reports, relevant laboratory and other test results, and medications completed Screening for signs and symptoms of depression related to chronic disease state  Assessed social determinant of health barriers AWV completed on 04-11-2022           Our next appointment is by telephone on 06-24-2022 at 10 am  Please call the care guide team  at [phone] if you need to cancel or reschedule your appointment.   If you are experiencing a Mental Health or [place] or need someone to talk to, please call the Suicide and Crisis Lifeline: 988 call the [place] National Suicide Prevention Lifeline: 920 051 4157 or TTY: (912) 790-9795 TTY 231-189-1722) to talk to a trained counselor call 1-800-273-TALK (toll free, 24 hour hotline)  Patient verbalizes understanding of instructions and care plan provided today and agrees to view in [place]. Active MyChart status and patient understanding of how to access instructions and care plan via MyChart confirmed with patient.     Telephone follow up appointment with care management team member scheduled for: 06-24-2022 at 12 am  Noreene Larsson RN, MSN, [facility] Mobile: [phone]    Diabetes Mellitus and Nutrition, Adult When you have diabetes, or diabetes mellitus, it is very important to have healthy eating habits because your blood sugar (glucose) levels are greatly affected by what you eat and drink. Eating healthy foods in the right amounts, at about the same times every day, can help you: Manage your blood glucose. Lower your risk of heart disease. Improve your blood pressure. Reach or maintain a healthy weight. What can affect my meal plan? Every person with diabetes is different, and each person has different needs for a meal plan. Your health care provider may recommend that you work with a dietitian to make a meal plan that is best for you. Your meal plan may vary depending  on factors such as: The calories you need. The medicines you take. Your weight. Your blood glucose, blood pressure, and cholesterol levels. Your activity level. Other health conditions you have, such as heart or kidney disease. How do carbohydrates affect me? Carbohydrates, also called carbs, affect your blood glucose level more than any other  type of food. Eating carbs raises the amount of glucose in your blood. It is important to know how many carbs you can safely have in each meal. This is different for every person. Your dietitian can help you calculate how many carbs you should have at each meal and for each snack. How does alcohol affect me? Alcohol can cause a decrease in blood glucose (hypoglycemia), especially if you use insulin or take certain diabetes medicines by mouth. Hypoglycemia can be a life-threatening condition. Symptoms of hypoglycemia, such as sleepiness, dizziness, and confusion, are similar to symptoms of having too much alcohol. Do not drink alcohol if: Your health care provider tells you not to drink. You are pregnant, may be pregnant, or are planning to become pregnant. If you drink alcohol: Limit how much you have to: 0-1 drink a day for women. 0-2 drinks a day for men. Know how much alcohol is in your drink. In the [place] one drink equals one 12 oz bottle of beer (355 mL), one 5 oz glass of wine (148 mL), or one 1 oz glass of hard liquor (44 mL). Keep yourself hydrated with water, diet soda, or unsweetened iced tea. Keep in mind that regular soda, juice, and other mixers may contain a lot of sugar and must be counted as carbs. What are tips for following this plan?  Reading food labels Start by checking the serving size on the Nutrition Facts label of packaged foods and drinks. The number of calories and the amount of carbs, fats, and other nutrients listed on the label are based on one serving of the item. Many items contain more than one serving per package. Check the total grams (g) of carbs in one serving. Check the number of grams of saturated fats and trans fats in one serving. Choose foods that have a low amount or none of these fats. Check the number of milligrams (mg) of salt (sodium) in one serving. Most people should limit total sodium intake to less than 2,300 mg per day. Always check the  nutrition information of foods labeled as "low-fat" or "nonfat." These foods may be higher in added sugar or refined carbs and should be avoided. Talk to your dietitian to identify your daily goals for nutrients listed on the label. Shopping Avoid buying canned, pre-made, or processed foods. These foods tend to be high in fat, sodium, and added sugar. Shop around the outside edge of the grocery store. This is where you will most often find fresh fruits and vegetables, bulk grains, fresh meats, and fresh dairy products. Cooking Use low-heat cooking methods, such as baking, instead of high-heat cooking methods, such as deep frying. Cook using healthy oils, such as olive, canola, or sunflower oil. Avoid cooking with butter, cream, or high-fat meats. Meal planning Eat meals and snacks regularly, preferably at the same times every day. Avoid going long periods of time without eating. Eat foods that are high in fiber, such as fresh fruits, vegetables, beans, and whole grains. Eat 4-6 oz (112-168 g) of lean protein each day, such as lean meat, chicken, fish, eggs, or tofu. One ounce (oz) (28 g) of lean protein is equal to: 1 oz (  28 g) of meat, chicken, or fish. 1 egg.  cup (62 g) of tofu. Eat some foods each day that contain healthy fats, such as avocado, nuts, seeds, and fish. What foods should I eat? Fruits Berries. Apples. Oranges. Peaches. Apricots. Plums. Grapes. Mangoes. Papayas. Pomegranates. Kiwi. Cherries. Vegetables Leafy greens, including lettuce, spinach, kale, chard, collard greens, mustard greens, and cabbage. Beets. Cauliflower. Broccoli. Carrots. Green beans. Tomatoes. Peppers. Onions. Cucumbers. Brussels sprouts. Grains Whole grains, such as whole-wheat or whole-grain bread, crackers, tortillas, cereal, and pasta. Unsweetened oatmeal. Quinoa. Brown or wild rice. Meats and other proteins Seafood. Poultry without skin. Lean cuts of poultry and beef. Tofu. Nuts.  Seeds. Dairy Low-fat or fat-free dairy products such as milk, yogurt, and cheese. The items listed above may not be a complete list of foods and beverages you can eat and drink. Contact a dietitian for more information. What foods should I avoid? Fruits Fruits canned with syrup. Vegetables Canned vegetables. Frozen vegetables with butter or cream sauce. Grains Refined white flour and flour products such as bread, pasta, snack foods, and cereals. Avoid all processed foods. Meats and other proteins Fatty cuts of meat. Poultry with skin. Breaded or fried meats. Processed meat. Avoid saturated fats. Dairy Full-fat yogurt, cheese, or milk. Beverages Sweetened drinks, such as soda or iced tea. The items listed above may not be a complete list of foods and beverages you should avoid. Contact a dietitian for more information. Questions to ask a health care provider Do I need to meet with a certified diabetes care and education specialist? Do I need to meet with a dietitian? What number can I call if I have questions? When are the best times to check my blood glucose? Where to find more information: American Diabetes Association: diabetes.org Academy of Nutrition and Dietetics: eatright.[employer] of Diabetes and Digestive and Kidney Diseases: [employer]is Association of Diabetes Care & Education Specialists: diabeteseducator.org Summary It is important to have healthy eating habits because your blood sugar (glucose) levels are greatly affected by what you eat and drink. It is important to use alcohol carefully. A healthy meal plan will help you manage your blood glucose and lower your risk of heart disease. Your health care provider may recommend that you work with a dietitian to make a meal plan that is best for you. This information is not intended to replace advice given to you by your health care provider. Make sure you discuss any questions you have with your health care  provider. Document Revised: 03/22/2020 Document Reviewed: 03/22/2020 Elsevier Patient Education  [place].

## 2022-04-22 NOTE — Patient Outreach (Signed)
  Care Coordination   Initial Visit Note   04/22/2022 Name: Alexandria [name] MRN: [id number] DOB: 1955/10/25  Alexandria Raven Proia is a 66 y.o. year old female who sees Gwyneth Sprout, FNP for primary care. I spoke with  Alexandria [name] by phone today  What matters to the patients health and wellness today?  Effective management of her DM. She does not want to have to take insulin    Goals Addressed             This Visit's Progress    RNCM: Effective Management of DM       Care Coordination Interventions:  Lab Results  Component Value Date   HGBA1C 6.1 (H) 04/11/2022    Provided education to patient about basic DM disease process. 04-22-2022: The patient wants to maintain control of her DM. She does not wish to be on "a shot". She wants to learn what she can so she will not have to have insulin. Her husband who is now deceased had to take insulin. He passed away in October 11, 2020. She has lost weight since her physical last year and she is watching what she eats.  Reviewed medications with patient and discussed importance of medication adherence. 04-22-2022: The patient is adherent to her medications Counseled on importance of regular laboratory monitoring as prescribed. 04-22-2022: Has regular labwork. Is compliant with A1C testing Discussed plans with patient for ongoing care management follow up and provided patient with direct contact information for care management team Provided patient with written educational materials related to hypo and hyperglycemia and importance of correct treatment. 04-22-2022: Review of the sx and sx of hypo and hyperglycemia Reviewed scheduled/upcoming provider appointments including: patient saw the pcp on 04-11-2022, knows to call for changes or new needs Review of patient status, including review of consultants reports, relevant laboratory and other test results, and medications completed Screening for signs and symptoms of depression related to chronic disease state   Assessed social determinant of health barriers AWV completed on 04-11-2022           SDOH assessments and interventions completed:  Yes  SDOH Interventions Today    Flowsheet Row Most Recent Value  SDOH Interventions   Food Insecurity Interventions Intervention Not Indicated        Care Coordination Interventions Activated:  Yes  Care Coordination Interventions:  Yes, provided   Follow up plan: Follow up call scheduled for 06-24-2022 at 10 am    Encounter Outcome:  Pt. Visit Completed   Noreene Larsson RN, MSN, [facility] Mobile: [phone]

## 2022-04-26 DIAGNOSIS — E119 Type 2 diabetes mellitus without complications: Secondary | ICD-10-CM | POA: Diagnosis not present

## 2022-04-26 LAB — HM DIABETES EYE EXAM

## 2022-04-29 DIAGNOSIS — H5213 Myopia, bilateral: Secondary | ICD-10-CM | POA: Diagnosis not present

## 2022-05-10 ENCOUNTER — Other Ambulatory Visit: Payer: Self-pay | Admitting: Family Medicine

## 2022-05-10 DIAGNOSIS — F419 Anxiety disorder, unspecified: Secondary | ICD-10-CM

## 2022-05-15 ENCOUNTER — Ambulatory Visit
Admission: RE | Admit: 2022-05-15 | Discharge: 2022-05-15 | Disposition: A | Discharge status: Home / Self Care | Payer: Medicare Other | Source: Ambulatory Visit | Attending: Family Medicine | Admitting: Family Medicine

## 2022-05-15 DIAGNOSIS — Z1231 Encounter for screening mammogram for malignant neoplasm of breast: Secondary | ICD-10-CM | POA: Insufficient documentation

## 2022-05-17 ENCOUNTER — Other Ambulatory Visit: Payer: Self-pay | Admitting: Family Medicine

## 2022-05-17 ENCOUNTER — Ambulatory Visit: Payer: Self-pay | Admitting: *Deleted

## 2022-05-17 DIAGNOSIS — N6489 Other specified disorders of breast: Secondary | ICD-10-CM

## 2022-05-17 DIAGNOSIS — I10 Essential (primary) hypertension: Secondary | ICD-10-CM

## 2022-05-17 DIAGNOSIS — R928 Other abnormal and inconclusive findings on diagnostic imaging of breast: Secondary | ICD-10-CM

## 2022-05-17 DIAGNOSIS — R921 Mammographic calcification found on diagnostic imaging of breast: Secondary | ICD-10-CM

## 2022-05-17 NOTE — Telephone Encounter (Signed)
Reason for Disposition  [1] Follow-up call to recent contact AND [2] information only call, no triage required  Answer Assessment - Initial Assessment Questions 1. REASON FOR CALL or QUESTION: "What is your reason for calling today?" or "How can I best help you?" or "What question do you have that I can help answer?"     Called in and was given the Mammogram message from Mikey Kirschner dated 05/16/2022 at 12:57 PM.  I let pt know to call us back towards end of day 3:00, if not heard from breast center to schedule for another mammogram and possible ultrasound.   Pt agreeable to this plan.  Protocols used: Information Only Call - No Triage-A-AH

## 2022-05-17 NOTE — Telephone Encounter (Signed)
Pt called because she was told if she had not heard from the breast center by 3 to call. I called to transfer her over but I received a voicemail. The number was given for her to call back if they do not receive a call soon.

## 2022-05-29 ENCOUNTER — Other Ambulatory Visit: Payer: Self-pay | Admitting: Family Medicine

## 2022-05-29 DIAGNOSIS — F419 Anxiety disorder, unspecified: Secondary | ICD-10-CM

## 2022-05-29 NOTE — Telephone Encounter (Signed)
Requested medication (s) are due for refill today:   Provider to review  Requested medication (s) are on the active medication list:   Yes  Future visit scheduled:   No  Had physical with Daneil Dan 1 month ago   Last ordered: 05/15/2022 #90, refills were blank  Non delegated refill reason returned   Requested Prescriptions  Pending Prescriptions Disp Refills   clonazePAM (KLONOPIN) 0.5 MG tablet [Pharmacy Med Name: clonazePAM 0.5 MG Oral Tablet] 90 tablet     Sig: [place] 3  TIMES DAILY AS NEEDED FOR  ANXIETY     Not Delegated - Psychiatry: Anxiolytics/Hypnotics 2 Failed - 05/29/2022  2:54 PM      Failed - This refill cannot be delegated      Failed - Urine Drug Screen completed in last 360 days      Passed - Patient is not pregnant      Passed - Valid encounter within last 6 months    Recent Outpatient Visits           1 month ago Annual physical exam   [facility] Tally Joe T, FNP   9 months ago Change in stool   [facility] Gwyneth Sprout, FNP   10 months ago Prediabetes   [facility] Gwyneth Sprout, FNP   11 months ago [facility] Gwyneth Sprout, FNP   1 year ago Annual physical exam   [facility] [place], Vickki Muff, [place]

## 2022-06-06 ENCOUNTER — Ambulatory Visit
Admission: RE | Admit: 2022-06-06 | Discharge: 2022-06-06 | Disposition: A | Discharge status: Home / Self Care | Payer: Medicare Other | Source: Ambulatory Visit | Attending: Family Medicine | Admitting: Family Medicine

## 2022-06-06 ENCOUNTER — Other Ambulatory Visit: Payer: Self-pay | Admitting: Family Medicine

## 2022-06-06 DIAGNOSIS — R928 Other abnormal and inconclusive findings on diagnostic imaging of breast: Secondary | ICD-10-CM

## 2022-06-06 DIAGNOSIS — N6489 Other specified disorders of breast: Secondary | ICD-10-CM

## 2022-06-06 DIAGNOSIS — R921 Mammographic calcification found on diagnostic imaging of breast: Secondary | ICD-10-CM

## 2022-06-06 NOTE — Progress Notes (Signed)
L breast changes with asymmetry and cyst as well. Recommendation is for guided biopsy to rule out cancer.  Please let us know if you have any questions.  Thank you, Gwyneth Sprout, [address] [place], [place] [address] [phone] (phone) [phone] (fax) [place]

## 2022-06-20 ENCOUNTER — Other Ambulatory Visit: Payer: Self-pay | Admitting: Family Medicine

## 2022-06-24 ENCOUNTER — Telehealth: Payer: Self-pay

## 2022-06-24 NOTE — Patient Outreach (Signed)
  Care Coordination   06/24/2022 Name: Alexandria [name] MRN: [id number] DOB: [birth year]   Care Coordination Outreach Attempts:  An unsuccessful telephone outreach was attempted for a scheduled appointment today.  Follow Up Plan:  Additional outreach attempts will be made to offer the patient care coordination information and services.   Encounter Outcome:  No Answer  Care Coordination Interventions Activated:  No   Care Coordination Interventions:  No, not indicated    Noreene Larsson RN, MSN, [facility]  Mobile: [phone]

## 2022-06-26 ENCOUNTER — Ambulatory Visit
Admission: RE | Admit: 2022-06-26 | Discharge: 2022-06-26 | Disposition: A | Discharge status: Home / Self Care | Payer: Medicare Other | Source: Ambulatory Visit | Attending: Family Medicine | Admitting: Family Medicine

## 2022-06-26 DIAGNOSIS — R928 Other abnormal and inconclusive findings on diagnostic imaging of breast: Secondary | ICD-10-CM

## 2022-06-26 HISTORY — PX: BREAST BIOPSY: SHX20

## 2022-06-27 ENCOUNTER — Encounter: Payer: Self-pay | Admitting: *Deleted

## 2022-06-27 DIAGNOSIS — C50919 Malignant neoplasm of unspecified site of unspecified female breast: Secondary | ICD-10-CM

## 2022-06-27 LAB — SURGICAL PATHOLOGY

## 2022-06-27 NOTE — Progress Notes (Signed)
Received referral for newly diagnosed breast cancer from [facility].  Navigation initiated.  Referral sent to [facility] per patient preference.   She will see Dr. Darrall Dears on Monday 10/30.  Appointment information given to patient and daughter.

## 2022-06-28 ENCOUNTER — Telehealth: Payer: Self-pay | Admitting: *Deleted

## 2022-06-28 NOTE — Chronic Care Management (AMB) (Signed)
  Care Coordination Note  06/28/2022 Name: Alexandria [name] MRN: [id number] DOB: 12-20-1955  Alexandria [name] is a 66 y.o. year old female who is a primary care patient of Gwyneth Sprout, FNP and is actively engaged with the care management team. I reached out to [place] by phone today to assist with re-scheduling a follow up visit with the RN Case Manager  Follow up plan: Telephone appointment with care management team member scheduled for: 07/02/2022  Julian Hy, [place] Direct Dial: [phone]

## 2022-07-01 ENCOUNTER — Encounter: Payer: Self-pay | Admitting: Surgery

## 2022-07-01 ENCOUNTER — Encounter: Payer: Self-pay | Admitting: Internal Medicine

## 2022-07-01 ENCOUNTER — Other Ambulatory Visit: Payer: Self-pay

## 2022-07-01 ENCOUNTER — Other Ambulatory Visit: Payer: Self-pay | Admitting: Surgery

## 2022-07-01 ENCOUNTER — Encounter: Payer: Self-pay | Admitting: *Deleted

## 2022-07-01 ENCOUNTER — Inpatient Hospital Stay: Payer: Medicare Other

## 2022-07-01 ENCOUNTER — Inpatient Hospital Stay: Payer: Medicare Other | Attending: Internal Medicine | Admitting: Internal Medicine

## 2022-07-01 ENCOUNTER — Ambulatory Visit (INDEPENDENT_AMBULATORY_CARE_PROVIDER_SITE_OTHER): Payer: Medicare Other | Admitting: Surgery

## 2022-07-01 ENCOUNTER — Telehealth: Payer: Self-pay

## 2022-07-01 VITALS — BP 145/71 | HR 81 | Temp 98.1°F | Ht 67.0 in | Wt 169.8 lb

## 2022-07-01 DIAGNOSIS — C50912 Malignant neoplasm of unspecified site of left female breast: Secondary | ICD-10-CM | POA: Insufficient documentation

## 2022-07-01 DIAGNOSIS — Z79899 Other long term (current) drug therapy: Secondary | ICD-10-CM | POA: Diagnosis not present

## 2022-07-01 DIAGNOSIS — Z170 Estrogen receptor positive status [ER+]: Secondary | ICD-10-CM

## 2022-07-01 DIAGNOSIS — F17210 Nicotine dependence, cigarettes, uncomplicated: Secondary | ICD-10-CM | POA: Diagnosis not present

## 2022-07-01 DIAGNOSIS — C50412 Malignant neoplasm of upper-outer quadrant of left female breast: Secondary | ICD-10-CM

## 2022-07-01 DIAGNOSIS — R928 Other abnormal and inconclusive findings on diagnostic imaging of breast: Secondary | ICD-10-CM

## 2022-07-01 DIAGNOSIS — C50919 Malignant neoplasm of unspecified site of unspecified female breast: Secondary | ICD-10-CM

## 2022-07-01 NOTE — Progress Notes (Signed)
Accompanied patient and daughter to initial medical oncology appointment.   Reviewed Breast Cancer treatment handbook.   Care plan summary given to patient.   Reviewed outreach programs and cancer center services.

## 2022-07-01 NOTE — Telephone Encounter (Signed)
Faxed medical clearance to Tally Joe, FNP at [phone].

## 2022-07-01 NOTE — Progress Notes (Signed)
[place] NOTE  Patient Care Team: Alexandria Sprout, FNP as PCP - General (Family Medicine) Alexandria Huge, RN as Oncology Nurse Navigator Alexandria David, RN as [place] Management  REFERRING PROVIDER: Tally Joe, FNP  REASON FOR REFFERAL: left breast cancer  CANCER STAGING   Cancer Staging  Breast cancer, left [facility]) Staging form: Breast, AJCC 8th Edition - Clinical stage from 06/26/2022: Stage IA (cT1b, cN0, cM0, G2, ER+, PR-, HER2-) - Signed by Alexandria Canary, MD on 07/01/2022 Stage prefix: Initial diagnosis Method of lymph node assessment: Clinical Nuclear grade: G2 Mitotic count score: Score 1 Histologic grading system: 3 grade system   ASSESSMENT & PLAN:  Alexandria [name] 66 y.o. 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  #Left breast IDC, ER+ PR- HER2-, Stage 1A -Detected on screening mammogram done on 05/15/2022.  -Discussed in detail about the cancer diagnosis, prognosis and treatment options.  Patient had an appointment with Dr.Piscoya.  Plan is for left breast lumpectomy with SLNB on 07/17/2022.  Referral to radiation oncology was made to see after the surgery.  The tumor is more than 5 mm in size so we will send for Oncotype testing to help [place] make decision about chemotherapy.  We discussed about aromatase inhibitors which include letrozole, anastrozole and exemestane.  Duration of treatment will likely be 5 years.  Side effects were discussed including but not limited to myalgias, arthralgias, mood changes, hot flashes, thinning of bone.  Advised to take calcium and vitamin D supplements.  Encouraged weightbearing exercises to help with bone health.  discussed Aromatase inhibitors versus tamoxifen in early breast cancer: patient-level meta-analysis of the randomised  trials. Aromatase inhibitors reduce recurrence rates by about 30% (proportionately) compared with tamoxifen while treatments differ, but not thereafter. 5 years of an aromatase inhibitor reduces 10-year breast cancer mortality rates by about 15% compared with 5 years of tamoxifen, hence by about 40% (proportionately) compared with no endocrine treatment.   DEXA scan was several years ago.  I will repeat DEXA scan for baseline bone health with next visit.  Patient denies any family history of cancer.  But with the diagnosis of breast cancer we will make referral to genetics.  Orders Placed This Encounter  Procedures   Ambulatory referral to Radiation Oncology    Referral Priority:   Routine    Referral Type:   Consultation    Referral Reason:   Specialty Services Required    Requested Specialty:   Radiation Oncology    Number of Visits Requested:   1   Ambulatory referral to Genetics    Referral Priority:   Routine    Referral Type:   Consultation    Referral Reason:   Specialty Services Required    Number of Visits Requested:   1   RTC 3 weeks after surgery for MD visit.  The total time spent in the appointment was 60 minutes encounter with patients including review of chart and various tests results, discussions about plan of care and coordination of care plan   All questions were answered. The patient knows to call the clinic with any problems, questions or concerns. No barriers to learning was detected.  Alexandria Canary, MD 10/30/20233:15 PM   HISTORY OF PRESENTING ILLNESS:  Alexandria [name] 66 y.o. 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  Patient was seen today accompanied by her daughter.  She denies any changes with her breast, palpable mass or nipple discharge.  Denies any family history of breast cancer.   She has reduced appetite and fatigue for several months even prior to diagnosis of cancer.  I have reviewed her chart and materials related to her cancer extensively and collaborated history with the patient. Summary of oncologic history is as follows: Oncology History  Breast cancer, left ([place])  05/15/2022 Mammogram   Screening mammogram-  FINDINGS: In the left breast, a possible asymmetry and distortion with calcifications warrant further evaluation. In the right breast, no findings suspicious for malignancy.   IMPRESSION: Further evaluation is suggested for possible asymmetry and distortion with calcifications in the left breast.     06/06/2022 Mammogram   Diagnostic mammogram and US IMPRESSION: 1. Left breast architectural distortion in the upper-outer quadrant posterior depth, without a sonographic correlate, is intermediate suspicion for malignancy. 2. Previously described asymmetry in the inner left breast does not persist with additional views, consistent with superimposed fibroglandular tissue. 3. Incidentally visualized 3 mm benign simple cyst in the left breast 6 o'clock position.   RECOMMENDATION: Left breast stereotactic guided biopsy of the architectural distortion in the upper-outer quadrant (1 site).  [place] left axilla normal    06/26/2022 Pathology Results   DIAGNOSIS:  A. BREAST, LEFT UPPER OUTER QUADRANT, POSTERIOR DEPTH; STEREOTACTIC CORE  NEEDLE BIOPSY:  - INVASIVE MAMMARY CARCINOMA, NO SPECIAL TYPE.  - CALCIFICATIONS ASSOCIATED WITH BENIGN AND NEOPLASTIC MAMMARY ELEMENTS.   Size of invasive carcinoma: 5 mm in this sample  Histologic grade of invasive carcinoma: Grade 2                       Glandular/tubular differentiation score: 3                       Nuclear pleomorphism score: 2                       Mitotic rate score: 1                       Total score: 6  Ductal carcinoma in situ: Present, grade 2-3, with focal comedonecrosis  Lymphovascular  invasion: Not identified   ADDENDUM:  CASE SUMMARY: BREAST BIOMARKER TESTS  Estrogen Receptor (ER) Status: POSITIVE          Percentage of cells with nuclear positivity: Greater than 90%          Average intensity of staining: Strong   Progesterone Receptor (PgR) Status: NEGATIVE (LESS THAN 1%)          Internal control cells present and stain as expected   HER2 (by immunohistochemistry): NEGATIVE (Score 1+)  Ki-67: Not performed     07/01/2022 Initial Diagnosis   Breast cancer, left ([place])    Menarche age 69 Children 3 Age at first birth 33 Birth control OCP less than 5 years Menopause age 65 HRT no History of breast biopsies no Family history not significant  MEDICAL HISTORY:  Past Medical History:  Diagnosis Date   Anxiety    Carbuncle and furuncle of trunk    HTN (hypertension)    Hyperlipidemia    Intrinsic asthma, unspecified    Methicillin  resistant Staphylococcus aureus in conditions classified elsewhere and of unspecified site    Other psychological or physical stress, not elsewhere classified(V62.89)    Pre-diabetes    Tobacco abuse     SURGICAL HISTORY: Past Surgical History:  Procedure Laterality Date   BREAST BIOPSY Left 06/26/2022   stereo biopsy/ coil clip/ path pending   COLONOSCOPY WITH PROPOFOL N/A 04/10/2015   Procedure: COLONOSCOPY WITH PROPOFOL;  Surgeon: Lucilla Lame, MD;  Location: [place];  Service: Endoscopy;  Laterality: N/A;   COLONOSCOPY WITH PROPOFOL N/A 06/25/2021   Procedure: COLONOSCOPY WITH PROPOFOL;  Surgeon: Lucilla Lame, MD;  Location: [place];  Service: Endoscopy;  Laterality: N/A;  leave at 8 arrival   ESOPHAGOGASTRODUODENOSCOPY (EGD) WITH PROPOFOL N/A 12/06/2021   Procedure: ESOPHAGOGASTRODUODENOSCOPY (EGD) WITH PROPOFOL;  Surgeon: Lucilla Lame, MD;  Location: [place];  Service: Endoscopy;  Laterality: N/A;   POLYPECTOMY  04/10/2015   Procedure: POLYPECTOMY;  Surgeon: Lucilla Lame, MD;   Location: [place];  Service: Endoscopy;;   POLYPECTOMY N/A 06/25/2021   Procedure: POLYPECTOMY;  Surgeon: Lucilla Lame, MD;  Location: [place];  Service: Endoscopy;  Laterality: N/A;   STATUS POST ENDOMETRIAL ABLATION     TONSILLECTOMY     TUBAL LIGATION     bilateral, sterile ablation   VARICOSE VEINS      SOCIAL HISTORY: Social History   Socioeconomic History   Marital status: Widowed    Spouse name: Remo Lipps   Number of children: 3   Years of education: 12   Highest education level: Not on file  Occupational History   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  Tobacco Use   Smoking status: Every Day    Packs/day: 0.50    Years: 30.00    Total pack years: 15.00    Types: Cigarettes    Last attempt to quit: 01/31/2017    Years since quitting: 5.4   Smokeless tobacco: Never  Vaping Use   Vaping Use: Never used  Substance and Sexual Activity   Alcohol use: No    Alcohol/week: 0.0 standard drinks of alcohol   Drug use: No   Sexual activity: Never  Other Topics Concern   Not on file  Social History Narrative   Not on file   Social Determinants of Health   Financial Resource Strain: Low Risk  (04/08/2022)   Overall Financial Resource Strain (CARDIA)    Difficulty of Paying Living Expenses: Not hard at all  Food Insecurity: No Food Insecurity (04/22/2022)   Hunger Vital Sign    Worried About Running Out of Food in the Last Year: Never true    Ran Out of Food in the Last Year: Never true  Transportation Needs: No Transportation Needs (04/08/2022)   PRAPARE - Hydrologist (Medical): No    Lack of Transportation (Non-Medical): No  Physical Activity: Insufficiently Active (04/08/2022)   Exercise Vital Sign    Days of Exercise per Week: 2 days    Minutes of Exercise per Session: 20 min  Stress: No Stress Concern Present (04/08/2022)   [place]     Feeling of Stress : Not at all  Social Connections: Socially Isolated (04/08/2022)   Social Connection and Isolation Panel [NHANES]    Frequency of Communication with Friends and Family: More than three times a week    Frequency of Social Gatherings with Friends and Family: Once a week  Attends Religious Services: Never    Active Member of Clubs or Organizations: No    Attends Archivist Meetings: Never    Marital Status: Widowed  Intimate Partner Violence: Not At Risk (04/22/2022)   Humiliation, Afraid, Rape, and Kick questionnaire    Fear of Current or Ex-Partner: No    Emotionally Abused: No    Physically Abused: No    Sexually Abused: No    FAMILY HISTORY: Family History  Problem Relation Age of Onset   Coronary artery disease Mother    Hypertension Mother    Rheum arthritis Mother    Heart attack Mother    Lung cancer Father    Emphysema Father    Gout Father    Diabetes Sister    Hypertension Daughter    Hypertension Daughter    Obesity Daughter    Seizures Daughter    Breast cancer Neg Hx    Bladder Cancer Neg Hx    Kidney cancer Neg Hx     ALLERGIES:  is allergic to pravastatin sodium.  MEDICATIONS:  Current Outpatient Medications  Medication Sig Dispense Refill   Albuterol Sulfate (PROAIR RESPICLICK) 242 (90 Base) MCG/ACT AEPB Inhale 2 puffs into the lungs 4 (four) times daily as needed. 3 each 4   amLODipine (NORVASC) 10 MG tablet Take 1 tablet (10 mg total) by mouth daily. 90 tablet 3   cetirizine (ZYRTEC) 10 MG tablet Take 1 tablet (10 mg total) by mouth daily. 30 tablet 11   clonazePAM (KLONOPIN) 0.5 MG tablet TAKE ONE-HALF TABLET BY MOUTH 3  TIMES DAILY AS NEEDED FOR  ANXIETY 90 tablet 0   famotidine (PEPCID) 20 MG tablet Take 20 mg by mouth 2 (two) times daily.     hydrochlorothiazide (HYDRODIURIL) 25 MG tablet TAKE 1 TABLET BY MOUTH DAILY 60 tablet 5   loperamide (IMODIUM) 2 MG capsule Take 1 capsule (2 mg total) by mouth 4 (four) times daily  as needed for diarrhea or loose stools. 30 capsule 1   metFORMIN (GLUCOPHAGE-XR) 750 MG 24 hr tablet Take 750 mg by mouth daily with supper.     MULTIPLE VITAMIN PO Take 1 tablet by mouth daily. Reported on 03/13/2016     rosuvastatin (CRESTOR) 40 MG tablet Take 1 tablet (40 mg total) by mouth daily. 90 tablet 3   TRELEGY ELLIPTA 100-62.5-25 MCG/ACT AEPB USE 1 INHALATION BY MOUTH DAILY 120 each 5   No current facility-administered medications for this visit.    REVIEW OF SYSTEMS:   Pertinent information mentioned in HPI All other systems were reviewed with the patient and are negative.  PHYSICAL EXAMINATION: ECOG PERFORMANCE STATUS: 1 - Symptomatic but completely ambulatory  Vitals:   07/01/22 1354  BP: 137/80  Pulse: 88  Resp: 20  Temp: (!) 96.6 F (35.9 C)  SpO2: 100%   Filed Weights   07/01/22 1354  Weight: 166 lb 14.4 oz (75.7 kg)    GENERAL:alert, no distress and comfortable SKIN: skin color, texture, turgor are normal, no rashes or significant lesions EYES: normal, conjunctiva are pink and non-injected, sclera clear OROPHARYNX:no exudate, no erythema and lips, buccal mucosa, and tongue normal  NECK: supple, thyroid normal size, non-tender, without nodularity LYMPH:  no palpable lymphadenopathy in the cervical, axillary or inguinal LUNGS: clear to auscultation and percussion with normal breathing effort HEART: regular rate & rhythm and no murmurs and no lower extremity edema ABDOMEN:abdomen soft, non-tender and normal bowel sounds Musculoskeletal:no cyanosis of digits and no clubbing  PSYCH: alert &  oriented x 3 with fluent speech NEURO: no focal motor/sensory deficits  LABORATORY DATA:  I have reviewed the data as listed Lab Results  Component Value Date   WBC 9.1 04/11/2022   HGB 15.7 04/11/2022   HCT 46.7 (H) 04/11/2022   MCV 88 04/11/2022   PLT 318 04/11/2022   Recent Labs    04/11/22 1048  NA 140  K 4.1  CL 97  CO2 27  GLUCOSE 103*  BUN 6*   CREATININE 0.69  CALCIUM 10.3  PROT 6.8  ALBUMIN 4.6  AST 17  ALT 21  ALKPHOS 135*  BILITOT 0.5    RADIOGRAPHIC STUDIES: I have personally reviewed the radiological images as listed and agreed with the findings in the report. MM LT BREAST BX W LOC DEV 1ST LESION IMAGE BX SPEC STEREO GUIDE  Addendum Date: 06/27/2022   ADDENDUM REPORT: 06/27/2022 10:26 ADDENDUM: PATHOLOGY revealed: A. BREAST, LEFT UPPER OUTER QUADRANT, POSTERIOR DEPTH; STEREOTACTIC CORE NEEDLE BIOPSY: - INVASIVE MAMMARY CARCINOMA, NO SPECIAL TYPE. - CALCIFICATIONS ASSOCIATED WITH BENIGN AND NEOPLASTIC MAMMARY ELEMENTS. Size of invasive carcinoma: 5 mm in this sample. Grade 2. Ductal carcinoma in situ: Present, grade 2-3, with focal comedonecrosis. Lymphovascular invasion: Not identified. Pathology results are CONCORDANT with imaging findings, per Dr. Valentino Saxon. Pathology results and recommendations were discussed with patient and daughter [place] Mam) via telephone on 06/27/2022. Patient reported biopsy site doing well with no adverse symptoms, and only slight tenderness at the site. Post biopsy care instructions were reviewed, questions were answered and my direct phone number was provided. Patient was instructed to call [facility] for any additional questions or concerns related to biopsy site. RECOMMENDATION: Surgical consultation. Request for surgical consultation relayed to Casper Harrison RN at [facility] by Electa Sniff RN on 06/27/2022. Pathology results reported by Electa Sniff RN on 06/27/2022. Electronically Signed   By: Valentino Saxon M.D.   On: 06/27/2022 10:26   Result Date: 06/27/2022 CLINICAL DATA:  LEFT breast architectural distortion with associated faint calcifications. EXAM: LEFT BREAST STEREOTACTIC CORE NEEDLE BIOPSY COMPARISON:  Previous exam(s). FINDINGS: The patient and I discussed the procedure of stereotactic-guided biopsy including benefits and alternatives. We  discussed the high likelihood of a successful procedure. We discussed the risks of the procedure including infection, bleeding, tissue injury, clip migration, and inadequate sampling. Informed written consent was given. The usual time out protocol was performed immediately prior to the procedure. Using sterile technique and 1% lidocaine and 1% lidocaine with epinephrine as local anesthetic, under stereotactic guidance, a 9 gauge vacuum assisted device was used to perform core needle biopsy of architectural distortion in the LEFT upper outer breast at posterior depth using a lateral approach. Specimen radiograph was performed showing representative tissue and calcifications. Specimens with calcifications are identified for pathology. Lesion quadrant: Upper outer quadrant At the conclusion of the procedure, a COIL shaped tissue marker clip was deployed into the biopsy cavity. Follow-up 2-view mammogram was performed and dictated separately. IMPRESSION: Stereotactic-guided biopsy of LEFT breast architectural distortion with associated faint calcifications. No apparent complications. Electronically Signed: By: Valentino Saxon M.D. On: 06/26/2022 13:37   MM CLIP PLACEMENT LEFT  Result Date: 06/26/2022 CLINICAL DATA:  Status post stereotactic guided biopsy EXAM: 3D DIAGNOSTIC LEFT MAMMOGRAM POST STEREOTACTIC BIOPSY COMPARISON:  Previous exam(s). FINDINGS: 3D Mammographic images were obtained following stereotactic guided biopsy of architectural distortion. The biopsy marking clip is in the estimated expected position at the site of biopsy. The area is obscured by post  biopsy changes. IMPRESSION: Favored appropriate positioning of the COIL shaped biopsy marking clip at the site of biopsy in the upper outer breast. Final Assessment: Post Procedure Mammograms for Marker Placement Electronically Signed   By: Valentino Saxon M.D.   On: 06/26/2022 13:39  MM DIAG BREAST TOMO UNI LEFT  Result Date:  06/06/2022 CLINICAL DATA:  66 year old female for screening recall for possible left breast distortion and a separate left breast asymmetry EXAM: DIGITAL DIAGNOSTIC UNILATERAL LEFT MAMMOGRAM WITH TOMOSYNTHESIS; ULTRASOUND LEFT BREAST LIMITED TECHNIQUE: Left digital diagnostic mammography and breast tomosynthesis was performed.; Targeted ultrasound examination of the left breast was performed. COMPARISON:  Previous exam(s). ACR Breast Density Category b: There are scattered areas of fibroglandular density. FINDINGS: Diagnostic views of the upper-outer left breast confirm the presence of architectural distortion associated with a subtle group of microcalcifications (spot CC 1 of 2 image 36; spot MLO image 33). Targeted left breast ultrasound from 1:00 to 2:00 does not demonstrate any suspicious solid or cystic mass or area of shadowing. The previously described asymmetry in the inner left breast does not persist with additional views, consistent with superimposed fibroglandular tissue. Incidentally visualized 3 mm round circumscribed mass in the lower central left breast middle depth (spot CC 2 of 2 image 31, MLO image 38). Targeted left breast ultrasound demonstrates an oval circumscribed anechoic mass in the 6 o'clock position 4 cm from the nipple. This corresponds with the mammographic finding and is consistent with benign simple cyst. Targeted left axillary ultrasound demonstrates morphologically normal axillary lymph node. IMPRESSION: 1. Left breast architectural distortion in the upper-outer quadrant posterior depth, without a sonographic correlate, is intermediate suspicion for malignancy. 2. Previously described asymmetry in the inner left breast does not persist with additional views, consistent with superimposed fibroglandular tissue. 3. Incidentally visualized 3 mm benign simple cyst in the left breast 6 o'clock position. RECOMMENDATION: Left breast stereotactic guided biopsy of the architectural distortion  in the upper-outer quadrant (1 site). I have discussed the findings and recommendations with the patient. The biopsy procedure was discussed with the patient and questions were answered. Patient expressed their understanding of the biopsy recommendation. Patient will be scheduled for biopsy at her earliest convenience by the schedulers. Ordering provider will be notified. If applicable, a reminder letter will be sent to the patient regarding the next appointment. BI-RADS CATEGORY  4: Suspicious. Electronically Signed   By: Beryle Flock M.D.   On: 06/06/2022 14:59  US BREAST LTD UNI LEFT INC AXILLA  Result Date: 06/06/2022 CLINICAL DATA:  66 year old female for screening recall for possible left breast distortion and a separate left breast asymmetry EXAM: DIGITAL DIAGNOSTIC UNILATERAL LEFT MAMMOGRAM WITH TOMOSYNTHESIS; ULTRASOUND LEFT BREAST LIMITED TECHNIQUE: Left digital diagnostic mammography and breast tomosynthesis was performed.; Targeted ultrasound examination of the left breast was performed. COMPARISON:  Previous exam(s). ACR Breast Density Category b: There are scattered areas of fibroglandular density. FINDINGS: Diagnostic views of the upper-outer left breast confirm the presence of architectural distortion associated with a subtle group of microcalcifications (spot CC 1 of 2 image 36; spot MLO image 33). Targeted left breast ultrasound from 1:00 to 2:00 does not demonstrate any suspicious solid or cystic mass or area of shadowing. The previously described asymmetry in the inner left breast does not persist with additional views, consistent with superimposed fibroglandular tissue. Incidentally visualized 3 mm round circumscribed mass in the lower central left breast middle depth (spot CC 2 of 2 image 31, MLO image 38). Targeted left breast ultrasound demonstrates  an oval circumscribed anechoic mass in the 6 o'clock position 4 cm from the nipple. This corresponds with the mammographic finding and is  consistent with benign simple cyst. Targeted left axillary ultrasound demonstrates morphologically normal axillary lymph node. IMPRESSION: 1. Left breast architectural distortion in the upper-outer quadrant posterior depth, without a sonographic correlate, is intermediate suspicion for malignancy. 2. Previously described asymmetry in the inner left breast does not persist with additional views, consistent with superimposed fibroglandular tissue. 3. Incidentally visualized 3 mm benign simple cyst in the left breast 6 o'clock position. RECOMMENDATION: Left breast stereotactic guided biopsy of the architectural distortion in the upper-outer quadrant (1 site). I have discussed the findings and recommendations with the patient. The biopsy procedure was discussed with the patient and questions were answered. Patient expressed their understanding of the biopsy recommendation. Patient will be scheduled for biopsy at her earliest convenience by the schedulers. Ordering provider will be notified. If applicable, a reminder letter will be sent to the patient regarding the next appointment. BI-RADS CATEGORY  4: Suspicious. Electronically Signed   By: Beryle Flock M.D.   On: 06/06/2022 14:59

## 2022-07-01 NOTE — H&P (View-Only) (Signed)
07/01/2022  Reason for Visit:  Left breast cancer  Requesting Provider:  Tally Joe, FNP  History of Present Illness: Alexandria [name] is a 66 y.o. female presenting for evaluation of newly diagnosed left breast cancer.  The patient had a mammogram on 05/15/22 which showed a distortion and calcifications in the left breast.  A diagnostic mammogram and ultrasound on 06/06/22 showed this distortion in upper outer quadrant and biopsy was performed on 06/26/22.  This showed invasive left breast cancer, and ER status is positive.  The patient denies any palpable masses, nipple changes or drainage, and reports some soreness/bruising after biopsy.  There is no family history of breast cancer.  Menstrual cycle started at age 51, she has had three children, no breast feeding.  She does smoke, about 1 pack per day.  Past Medical History: Past Medical History:  Diagnosis Date   Anxiety    Carbuncle and furuncle of trunk    HTN (hypertension)    Hyperlipidemia    Intrinsic asthma, unspecified    Methicillin resistant Staphylococcus aureus in conditions classified elsewhere and of unspecified site    Other psychological or physical stress, not elsewhere classified(V62.89)    Pre-diabetes    Tobacco abuse      Past Surgical History: Past Surgical History:  Procedure Laterality Date   BREAST BIOPSY Left 06/26/2022   stereo biopsy/ coil clip/ path pending   COLONOSCOPY WITH PROPOFOL N/A 04/10/2015   Procedure: COLONOSCOPY WITH PROPOFOL;  Surgeon: Lucilla Lame, MD;  Location: [place];  Service: Endoscopy;  Laterality: N/A;   COLONOSCOPY WITH PROPOFOL N/A 06/25/2021   Procedure: COLONOSCOPY WITH PROPOFOL;  Surgeon: Lucilla Lame, MD;  Location: [place];  Service: Endoscopy;  Laterality: N/A;  leave at 8 arrival   ESOPHAGOGASTRODUODENOSCOPY (EGD) WITH PROPOFOL N/A 12/06/2021   Procedure: ESOPHAGOGASTRODUODENOSCOPY (EGD) WITH PROPOFOL;  Surgeon: Lucilla Lame, MD;  Location: [place];  Service: Endoscopy;  Laterality: N/A;   POLYPECTOMY  04/10/2015   Procedure: POLYPECTOMY;  Surgeon: Lucilla Lame, MD;  Location: [place];  Service: Endoscopy;;   POLYPECTOMY N/A 06/25/2021   Procedure: POLYPECTOMY;  Surgeon: Lucilla Lame, MD;  Location: [place];  Service: Endoscopy;  Laterality: N/A;   STATUS POST ENDOMETRIAL ABLATION     TONSILLECTOMY     TUBAL LIGATION     bilateral, sterile ablation   VARICOSE VEINS      Home Medications: Prior to Admission medications   Medication Sig Start Date End Date Taking? Authorizing Provider  Albuterol Sulfate (PROAIR RESPICLICK) 465 (90 Base) MCG/ACT AEPB Inhale 2 puffs into the lungs 4 (four) times daily as needed. 02/19/22  Yes Gwyneth Sprout, FNP  amLODipine (NORVASC) 10 MG tablet Take 1 tablet (10 mg total) by mouth daily. 01/04/22  Yes Gwyneth Sprout, FNP  cetirizine (ZYRTEC) 10 MG tablet Take 1 tablet (10 mg total) by mouth daily. 01/13/20  Yes Burnette, Clearnce Sorrel, PA-C  clonazePAM (KLONOPIN) 0.5 MG tablet TAKE ONE-HALF TABLET BY MOUTH 3  TIMES DAILY AS NEEDED FOR  ANXIETY 05/30/22  Yes Gwyneth Sprout, FNP  famotidine (PEPCID) 20 MG tablet Take 20 mg by mouth 2 (two) times daily.   Yes [provider]  hydrochlorothiazide (HYDRODIURIL) 25 MG tablet TAKE 1 TABLET BY MOUTH DAILY 05/17/22  Yes Tally Joe T, FNP  loperamide (IMODIUM) 2 MG capsule Take 1 capsule (2 mg total) by mouth 4 (four) times daily as needed for diarrhea or loose stools. 08/09/21  Yes Rollene Rotunda,  Jaci Standard, FNP  metFORMIN (GLUCOPHAGE-XR) 750 MG 24 hr tablet Take 750 mg by mouth daily with supper.   Yes [provider]  MULTIPLE VITAMIN PO Take 1 tablet by mouth daily. Reported on 03/13/2016   Yes [provider]  rosuvastatin (CRESTOR) 40 MG tablet Take 1 tablet (40 mg total) by mouth daily. 04/12/22  Yes Tally Joe T, FNP  TRELEGY ELLIPTA 100-62.5-25 MCG/ACT AEPB USE 1 INHALATION BY MOUTH DAILY 06/20/22  Yes Gwyneth Sprout, FNP  fluticasone [facility]) 50 MCG/ACT nasal spray Place 2 sprays into both nostrils daily. 12/09/19 01/06/20  Mar Daring, PA-C    Allergies: Allergies  Allergen Reactions   Pravastatin Sodium     Myalgia    Social History:  reports that she has been smoking cigarettes. She has a 15.00 pack-year smoking history. She has never used smokeless tobacco. She reports that she does not drink alcohol and does not use drugs.   Family History: Family History  Problem Relation Age of Onset   Coronary artery disease Mother    Hypertension Mother    Rheum arthritis Mother    Heart attack Mother    Lung cancer Father    Emphysema Father    Gout Father    Diabetes Sister    Hypertension Daughter    Hypertension Daughter    Obesity Daughter    Seizures Daughter    Breast cancer Neg Hx    Bladder Cancer Neg Hx    Kidney cancer Neg Hx     Review of Systems: Review of Systems  Constitutional:  Negative for chills and fever.  HENT:  Negative for hearing loss.   Respiratory:  Negative for shortness of breath.   Cardiovascular:  Negative for chest pain.  Gastrointestinal:  Negative for abdominal pain, nausea and vomiting.  Genitourinary:  Negative for dysuria.  Musculoskeletal:  Negative for myalgias.  Skin:  Negative for rash.  Neurological:  Negative for dizziness.  Psychiatric/Behavioral:  Negative for depression.     Physical Exam BP (!) 145/71   Pulse 81   Temp 98.1 F (36.7 C) (Oral)   Ht _0  (1.702 m)   Wt 169 lb 12.8 oz (77 kg)   SpO2 92%   BMI 26.59 kg/m  CONSTITUTIONAL: No acute distress, well nourished. HEENT:  Normocephalic, atraumatic, extraocular motion intact. NECK: Trachea is midline, and there is no jugular venous distension.  RESPIRATORY:  Lungs are clear, and breath sounds are equal bilaterally. Normal respiratory effort without pathologic use of accessory muscles. CARDIOVASCULAR: Heart is regular without murmurs, gallops, or rubs. BREAST:  Right breast without any palpable masses, skin changes, nipple changes.  No right axillary lymphadenopathy.  Left breast status post biopsy with mild ecchymosis and Steri-Strips still in place.  No palpable masses, nipple changes, or drainage.  No left axillary lymphadenopathy. MUSCULOSKELETAL:  Normal muscle strength and tone in all four extremities.  No peripheral edema or cyanosis. SKIN: Skin turgor is normal. There are no pathologic skin lesions.  NEUROLOGIC:  Motor and sensation is grossly normal.  Cranial nerves are grossly intact. PSYCH:  Alert and oriented to person, place and time. Affect is normal.  Laboratory Analysis: Labs on 04/11/2022: Na 140, K 4.1, Cl 97, CO2 27, BUN 6, Cr 0.69.  Total bili 0.5, AST 17, ALT 21, Alk Phos 135, Albumin 4.6.  WBC 9.1, Hgb 15.7, Hct 46.7, Plt 318.  Left breast Biopsy 06/26/22: DIAGNOSIS:  A. BREAST, LEFT UPPER OUTER QUADRANT, POSTERIOR DEPTH; STEREOTACTIC CORE  NEEDLE BIOPSY:  - INVASIVE MAMMARY CARCINOMA, NO SPECIAL TYPE.  - CALCIFICATIONS ASSOCIATED WITH BENIGN AND NEOPLASTIC MAMMARY ELEMENTS.  Size of invasive carcinoma: 5 mm in this sample  Histologic grade of invasive carcinoma: Grade 2                       Glandular/tubular differentiation score: 3                       Nuclear pleomorphism score: 2                       Mitotic rate score: 1                       Total score: 6  Ductal carcinoma in situ: Present, grade 2-3, with focal comedonecrosis  Lymphovascular invasion: Not identified   Imaging: Mammogram/US 06/06/22: FINDINGS: Diagnostic views of the upper-outer left breast confirm the presence of architectural distortion associated with a subtle group of microcalcifications (spot CC 1 of 2 image 36; spot MLO image 33). Targeted left breast ultrasound from 1:00 to 2:00 does not demonstrate any suspicious solid or cystic mass or area of shadowing.   The previously described asymmetry in the inner left breast does not persist with  additional views, consistent with superimposed fibroglandular tissue.   Incidentally visualized 3 mm round circumscribed mass in the lower central left breast middle depth (spot CC 2 of 2 image 31, MLO image 38). Targeted left breast ultrasound demonstrates an oval circumscribed anechoic mass in the 6 o'clock position 4 cm from the nipple. This corresponds with the mammographic finding and is consistent with benign simple cyst.   Targeted left axillary ultrasound demonstrates morphologically normal axillary lymph node.   IMPRESSION: 1. Left breast architectural distortion in the upper-outer quadrant posterior depth, without a sonographic correlate, is intermediate suspicion for malignancy. 2. Previously described asymmetry in the inner left breast does not persist with additional views, consistent with superimposed fibroglandular tissue. 3. Incidentally visualized 3 mm benign simple cyst in the left breast 6 o'clock position.   RECOMMENDATION: Left breast stereotactic guided biopsy of the architectural distortion in the upper-outer quadrant (1 site).  Assessment and Plan: This is a 66 y.o. female with newly diagnosed left breast cancer  --Discussed with the patient the findings of her mammogram and biopsy results.  Discussed with her the potential surgical options of lumpectomy and sentinel lymph node biopsy vs mastectomy and sentinel lymph node biopsy.  She has opted for lumpectomy.  She understands that with lumpectomy, we would also recommend radiation therapy.  Discussed with her the steps leading to surgery and the day of surgery itself.  She understands that she would need an RF tag to be placed so the cancer area can be better localized for surgery.  This would be as outpatient through [place].  She also understands day of surgery would involve going to nuclear medicine for injection of radiotracer and then the surgery itself, where we would do the sentinel lymph node biopsy  first, followed by the lumpectomy. --Reviewed the surgery at length with her including the incisions, risks of bleeding, infection, injury to surrounding structures, lymphedema, post-op pain control, that this would be an outpatient procedure, use of a breast binder afterwards, and she's willing to proceed. --Will schedule her for surgery on 07/17/22.  All of her questions have been answered.  Will also send for medical clearance.  I spent 55 minutes dedicated to the care of this patient on the date of this encounter to include pre-visit review of records, face-to-face time with the patient discussing diagnosis and management, and any post-visit coordination of care.   Melvyn Neth, [facility]

## 2022-07-01 NOTE — Progress Notes (Signed)
07/01/2022  Reason for Visit:  Left breast cancer  Requesting Provider:  Tally Joe, FNP  History of Present Illness: Alexandria [name] is a 66 y.o. female presenting for evaluation of newly diagnosed left breast cancer.  The patient had a mammogram on 05/15/22 which showed a distortion and calcifications in the left breast.  A diagnostic mammogram and ultrasound on 06/06/22 showed this distortion in upper outer quadrant and biopsy was performed on 06/26/22.  This showed invasive left breast cancer, and ER status is positive.  The patient denies any palpable masses, nipple changes or drainage, and reports some soreness/bruising after biopsy.  There is no family history of breast cancer.  Menstrual cycle started at age 40, she has had three children, no breast feeding.  She does smoke, about 1 pack per day.  Past Medical History: Past Medical History:  Diagnosis Date   Anxiety    Carbuncle and furuncle of trunk    HTN (hypertension)    Hyperlipidemia    Intrinsic asthma, unspecified    Methicillin resistant Staphylococcus aureus in conditions classified elsewhere and of unspecified site    Other psychological or physical stress, not elsewhere classified(V62.89)    Pre-diabetes    Tobacco abuse      Past Surgical History: Past Surgical History:  Procedure Laterality Date   BREAST BIOPSY Left 06/26/2022   stereo biopsy/ coil clip/ path pending   COLONOSCOPY WITH PROPOFOL N/A 04/10/2015   Procedure: COLONOSCOPY WITH PROPOFOL;  Surgeon: Lucilla Lame, MD;  Location: [place];  Service: Endoscopy;  Laterality: N/A;   COLONOSCOPY WITH PROPOFOL N/A 06/25/2021   Procedure: COLONOSCOPY WITH PROPOFOL;  Surgeon: Lucilla Lame, MD;  Location: [place];  Service: Endoscopy;  Laterality: N/A;  leave at 8 arrival   ESOPHAGOGASTRODUODENOSCOPY (EGD) WITH PROPOFOL N/A 12/06/2021   Procedure: ESOPHAGOGASTRODUODENOSCOPY (EGD) WITH PROPOFOL;  Surgeon: Lucilla Lame, MD;  Location: [place];  Service: Endoscopy;  Laterality: N/A;   POLYPECTOMY  04/10/2015   Procedure: POLYPECTOMY;  Surgeon: Lucilla Lame, MD;  Location: [place];  Service: Endoscopy;;   POLYPECTOMY N/A 06/25/2021   Procedure: POLYPECTOMY;  Surgeon: Lucilla Lame, MD;  Location: [place];  Service: Endoscopy;  Laterality: N/A;   STATUS POST ENDOMETRIAL ABLATION     TONSILLECTOMY     TUBAL LIGATION     bilateral, sterile ablation   VARICOSE VEINS      Home Medications: Prior to Admission medications   Medication Sig Start Date End Date Taking? Authorizing Provider  Albuterol Sulfate (PROAIR RESPICLICK) 654 (90 Base) MCG/ACT AEPB Inhale 2 puffs into the lungs 4 (four) times daily as needed. 02/19/22  Yes Gwyneth Sprout, FNP  amLODipine (NORVASC) 10 MG tablet Take 1 tablet (10 mg total) by mouth daily. 01/04/22  Yes Gwyneth Sprout, FNP  cetirizine (ZYRTEC) 10 MG tablet Take 1 tablet (10 mg total) by mouth daily. 01/13/20  Yes Burnette, Clearnce Sorrel, PA-C  clonazePAM (KLONOPIN) 0.5 MG tablet TAKE ONE-HALF TABLET BY MOUTH 3  TIMES DAILY AS NEEDED FOR  ANXIETY 05/30/22  Yes Gwyneth Sprout, FNP  famotidine (PEPCID) 20 MG tablet Take 20 mg by mouth 2 (two) times daily.   Yes [provider]  hydrochlorothiazide (HYDRODIURIL) 25 MG tablet TAKE 1 TABLET BY MOUTH DAILY 05/17/22  Yes Tally Joe T, FNP  loperamide (IMODIUM) 2 MG capsule Take 1 capsule (2 mg total) by mouth 4 (four) times daily as needed for diarrhea or loose stools. 08/09/21  Yes Rollene Rotunda,  Jaci Standard, FNP  metFORMIN (GLUCOPHAGE-XR) 750 MG 24 hr tablet Take 750 mg by mouth daily with supper.   Yes [provider]  MULTIPLE VITAMIN PO Take 1 tablet by mouth daily. Reported on 03/13/2016   Yes [provider]  rosuvastatin (CRESTOR) 40 MG tablet Take 1 tablet (40 mg total) by mouth daily. 04/12/22  Yes Tally Joe T, FNP  TRELEGY ELLIPTA 100-62.5-25 MCG/ACT AEPB USE 1 INHALATION BY MOUTH DAILY 06/20/22  Yes Gwyneth Sprout, FNP  fluticasone [facility]) 50 MCG/ACT nasal spray Place 2 sprays into both nostrils daily. 12/09/19 01/06/20  Mar Daring, PA-C    Allergies: Allergies  Allergen Reactions   Pravastatin Sodium     Myalgia    Social History:  reports that she has been smoking cigarettes. She has a 15.00 pack-year smoking history. She has never used smokeless tobacco. She reports that she does not drink alcohol and does not use drugs.   Family History: Family History  Problem Relation Age of Onset   Coronary artery disease Mother    Hypertension Mother    Rheum arthritis Mother    Heart attack Mother    Lung cancer Father    Emphysema Father    Gout Father    Diabetes Sister    Hypertension Daughter    Hypertension Daughter    Obesity Daughter    Seizures Daughter    Breast cancer Neg Hx    Bladder Cancer Neg Hx    Kidney cancer Neg Hx     Review of Systems: Review of Systems  Constitutional:  Negative for chills and fever.  HENT:  Negative for hearing loss.   Respiratory:  Negative for shortness of breath.   Cardiovascular:  Negative for chest pain.  Gastrointestinal:  Negative for abdominal pain, nausea and vomiting.  Genitourinary:  Negative for dysuria.  Musculoskeletal:  Negative for myalgias.  Skin:  Negative for rash.  Neurological:  Negative for dizziness.  Psychiatric/Behavioral:  Negative for depression.     Physical Exam BP (!) 145/71   Pulse 81   Temp 98.1 F (36.7 C) (Oral)   Ht _0  (1.702 m)   Wt 169 lb 12.8 oz (77 kg)   SpO2 92%   BMI 26.59 kg/m  CONSTITUTIONAL: No acute distress, well nourished. HEENT:  Normocephalic, atraumatic, extraocular motion intact. NECK: Trachea is midline, and there is no jugular venous distension.  RESPIRATORY:  Lungs are clear, and breath sounds are equal bilaterally. Normal respiratory effort without pathologic use of accessory muscles. CARDIOVASCULAR: Heart is regular without murmurs, gallops, or rubs. BREAST:  Right breast without any palpable masses, skin changes, nipple changes.  No right axillary lymphadenopathy.  Left breast status post biopsy with mild ecchymosis and Steri-Strips still in place.  No palpable masses, nipple changes, or drainage.  No left axillary lymphadenopathy. MUSCULOSKELETAL:  Normal muscle strength and tone in all four extremities.  No peripheral edema or cyanosis. SKIN: Skin turgor is normal. There are no pathologic skin lesions.  NEUROLOGIC:  Motor and sensation is grossly normal.  Cranial nerves are grossly intact. PSYCH:  Alert and oriented to person, place and time. Affect is normal.  Laboratory Analysis: Labs on 04/11/2022: Na 140, K 4.1, Cl 97, CO2 27, BUN 6, Cr 0.69.  Total bili 0.5, AST 17, ALT 21, Alk Phos 135, Albumin 4.6.  WBC 9.1, Hgb 15.7, Hct 46.7, Plt 318.  Left breast Biopsy 06/26/22: DIAGNOSIS:  A. BREAST, LEFT UPPER OUTER QUADRANT, POSTERIOR DEPTH; STEREOTACTIC CORE  NEEDLE BIOPSY:  - INVASIVE MAMMARY CARCINOMA, NO SPECIAL TYPE.  - CALCIFICATIONS ASSOCIATED WITH BENIGN AND NEOPLASTIC MAMMARY ELEMENTS.  Size of invasive carcinoma: 5 mm in this sample  Histologic grade of invasive carcinoma: Grade 2                       Glandular/tubular differentiation score: 3                       Nuclear pleomorphism score: 2                       Mitotic rate score: 1                       Total score: 6  Ductal carcinoma in situ: Present, grade 2-3, with focal comedonecrosis  Lymphovascular invasion: Not identified   Imaging: Mammogram/US 06/06/22: FINDINGS: Diagnostic views of the upper-outer left breast confirm the presence of architectural distortion associated with a subtle group of microcalcifications (spot CC 1 of 2 image 36; spot MLO image 33). Targeted left breast ultrasound from 1:00 to 2:00 does not demonstrate any suspicious solid or cystic mass or area of shadowing.   The previously described asymmetry in the inner left breast does not persist with  additional views, consistent with superimposed fibroglandular tissue.   Incidentally visualized 3 mm round circumscribed mass in the lower central left breast middle depth (spot CC 2 of 2 image 31, MLO image 38). Targeted left breast ultrasound demonstrates an oval circumscribed anechoic mass in the 6 o'clock position 4 cm from the nipple. This corresponds with the mammographic finding and is consistent with benign simple cyst.   Targeted left axillary ultrasound demonstrates morphologically normal axillary lymph node.   IMPRESSION: 1. Left breast architectural distortion in the upper-outer quadrant posterior depth, without a sonographic correlate, is intermediate suspicion for malignancy. 2. Previously described asymmetry in the inner left breast does not persist with additional views, consistent with superimposed fibroglandular tissue. 3. Incidentally visualized 3 mm benign simple cyst in the left breast 6 o'clock position.   RECOMMENDATION: Left breast stereotactic guided biopsy of the architectural distortion in the upper-outer quadrant (1 site).  Assessment and Plan: This is a 66 y.o. female with newly diagnosed left breast cancer  --Discussed with the patient the findings of her mammogram and biopsy results.  Discussed with her the potential surgical options of lumpectomy and sentinel lymph node biopsy vs mastectomy and sentinel lymph node biopsy.  She has opted for lumpectomy.  She understands that with lumpectomy, we would also recommend radiation therapy.  Discussed with her the steps leading to surgery and the day of surgery itself.  She understands that she would need an RF tag to be placed so the cancer area can be better localized for surgery.  This would be as outpatient through [place].  She also understands day of surgery would involve going to nuclear medicine for injection of radiotracer and then the surgery itself, where we would do the sentinel lymph node biopsy  first, followed by the lumpectomy. --Reviewed the surgery at length with her including the incisions, risks of bleeding, infection, injury to surrounding structures, lymphedema, post-op pain control, that this would be an outpatient procedure, use of a breast binder afterwards, and she's willing to proceed. --Will schedule her for surgery on 07/17/22.  All of her questions have been answered.  Will also send for medical clearance.  I spent 55 minutes dedicated to the care of this patient on the date of this encounter to include pre-visit review of records, face-to-face time with the patient discussing diagnosis and management, and any post-visit coordination of care.   Melvyn Neth, [facility]

## 2022-07-01 NOTE — Patient Instructions (Signed)
Our surgery scheduler Barbara will call you within 24-48 hours to get you scheduled. If you have not heard from her after 48 hours, please call our office. Have the blue sheet available when she calls to write down important information.   If you have any concerns or questions, please feel free to call our office.  Lumpectomy  A lumpectomy, sometimes called a partial mastectomy, is surgery to remove a cancerous tumor or mass (the lump) from a breast. It is a form of breast-conserving or breast-preservation surgery. This means that the cancerous tissue is removed but the breast remains intact. During a lumpectomy, the portion of the breast that contains the tumor is removed. Lymph nodes under your arm may also be removed. Lymph nodes are part of the body's disease-fighting system (immune system) and are usually the first place where breast cancer spreads. Tell a health care provider about: Any allergies you have. All medicines you are taking, including vitamins, herbs, eye drops, creams, and over-the-counter medicines. Any problems you or family members have had with anesthetic medicines. Any bleeding problems you have. Any surgeries you have had. Any medical conditions you have. Whether you are pregnant or may be pregnant. What are the risks? Generally, this is a safe procedure. However, problems may occur, including: Bleeding. Infection. Allergic reaction to medicines. Pain, swelling, weakness, or numbness in the arm on the side of your surgery. Temporary swelling. Change in the shape of the breast, especially if a large portion is removed. Scar tissue that forms at the surgical site and feels hard to the touch. Blood clots. What happens before the procedure? When to stop eating and drinking Follow instructions from your health care provider about what you may eat and drink. These may include: 8 hours before your procedure Stop eating most foods. Do not eat meat, fried foods, or fatty  foods. Eat only light foods, such as toast or crackers. All liquids are okay except energy drinks and alcohol. 6 hours before your procedure Stop eating. Drink only clear liquids, such as water, clear fruit juice, black coffee, plain tea, and sports drinks. Do not drink energy drinks or alcohol. 2 hours before your procedure Stop drinking all liquids. You may be allowed to take medicines with small sips of water. If you do not follow your health care provider's instructions, your procedure may be delayed or canceled. Medicines Ask your health care provider about: Changing or stopping your regular medicines. These include any diabetes medicines or blood thinners you take. Taking medicines such as aspirin and ibuprofen. These medicines can thin your blood. Do not take them unless your health care provider tells you to. Taking over-the-counter medicines, vitamins, herbs, and supplements. Surgery safety Ask your health care provider how your surgery site will be marked. A procedure may be done to locate and mark the tumor area in the breast (localization). This will guide the surgeon to where the incision will be made. This may be done with: Imaging, such as a mammogram, ultrasound, or MRI. Insertion of a small wire, clip, or seed, or an implant that will reflect a radar signal. Also, ask what steps will be taken to help prevent infection. These may include: Washing skin with a germ-killing soap. Taking antibiotic medicine. General instructions You may have screening tests or exams to get normal measurements of your arm, also called baseline measurements. These can be compared to measurements done after surgery to monitor for swelling (lymphedema) that can develop after having lymph nodes removed. If you   will be going home right after the procedure, plan to have a responsible adult: Take you home from the hospital or clinic. You will not be allowed to drive. Care for you for the time you are  told. What happens during the procedure?  An IV will be inserted into one of your veins. You may be given: A sedative. This helps you relax. Anesthesia. This will: Numb certain areas of your body. Make you fall asleep for surgery. An electric scalpel will be used to reduce bleeding (electrocautery knife). A curved incision that follows the natural curve of your breast will be made. The tumor will be removed along with some of the tissue around it. This will be sent to the lab for testing. Your health care provider may also remove lymph nodes at this time if needed. If the tumor is close to the muscles over your chest, some muscle tissue may also be removed. A small drain tube may be inserted into your breast area or armpit to collect fluid that may build up after surgery. This tube will be connected to a suction bulb on the outside of your body to remove the fluid. The incision will be closed with stitches (sutures). A bandage (dressing) may be placed over the incision. The procedure may vary among health care providers and hospitals. What happens after the procedure? Your blood pressure, heart rate, breathing rate, and blood oxygen level will be monitored until you leave the hospital or clinic. You will be given medicine for pain as needed. You will be encouraged to get up and walk as soon as you can. This will improve blood flow and breathing. Ask for help if you feel weak or unsteady. You may have a drain tube in place for 2-3 days to prevent a collection of blood (hematoma) from developing in the breast. You may have a pressure bandage applied for 1-2 days to prevent bleeding or swelling. Ask your health care provider how to care for your bandage at home. You may be given a tight sleeve to wear over your arm on the side of your surgery. Wear the sleeve as told by your health care provider. Do not drive or operate machinery until your health care provider says that it is safe. Where to  find more information American Cancer Society: cancer.org National Cancer Institute: cancer.gov Summary A lumpectomy, sometimes called a partial mastectomy, is surgery to remove a cancerous tumor or mass (the lump) from a breast. During a lumpectomy, the portion of the breast that contains the tumor is removed. Plan to have someone take you home from the hospital or clinic. You may have a drain tube in place for 2-3 days to prevent a collection of blood (hematoma) from developing in the breast. This information is not intended to replace advice given to you by your health care provider. Make sure you discuss any questions you have with your health care provider. Document Revised: 11/12/2021 Document Reviewed: 10/28/2021 Elsevier Patient Education  2023 Elsevier Inc.  

## 2022-07-02 ENCOUNTER — Ambulatory Visit: Payer: Self-pay | Admitting: *Deleted

## 2022-07-02 ENCOUNTER — Telehealth: Payer: Self-pay | Admitting: Surgery

## 2022-07-02 ENCOUNTER — Other Ambulatory Visit: Payer: Self-pay | Admitting: Surgery

## 2022-07-02 DIAGNOSIS — R928 Other abnormal and inconclusive findings on diagnostic imaging of breast: Secondary | ICD-10-CM

## 2022-07-02 DIAGNOSIS — C50919 Malignant neoplasm of unspecified site of unspecified female breast: Secondary | ICD-10-CM

## 2022-07-02 NOTE — Patient Instructions (Signed)
Visit Information  Thank you for taking time to visit with me today. Please don't hesitate to contact me if I can be of assistance to you before our next scheduled telephone appointment.  Following are the goals we discussed today:  Follow up with PCP office regarding medical clearance for surgery.  Our next appointment is by telephone on 11/17 at 9am  Please call the care guide team at [phone] if you need to cancel or reschedule your appointment.   Please call the Suicide and Crisis Lifeline: 988 call the [place] National Suicide Prevention Lifeline: (620) 861-4374 or TTY: (805) 369-2048 TTY 360-772-3440) to talk to a trained counselor call 1-800-273-TALK (toll free, 24 hour hotline) call 911 if you are experiencing a Mental Health or [place] or need someone to talk to.  Patient verbalizes understanding of instructions and care plan provided today and agrees to view in [place]. Active MyChart status and patient understanding of how to access instructions and care plan via MyChart confirmed with patient.     The patient has been provided with contact information for the care management team and has been advised to call with any health related questions or concerns.   Valente David, RN, MSN, [place] Care Management Care Management Coordinator [phone]

## 2022-07-02 NOTE — Patient Outreach (Signed)
  Care Coordination   Follow Up Visit Note   07/02/2022 Name: Alexandria [name] MRN: [id number] DOB: Jan 21, 1956  Alexandria [name] is a 66 y.o. year old female who sees Gwyneth Sprout, FNP for primary care. I spoke with  Alexandria [name] by phone today.  What matters to the patients health and wellness today?  Diagnosed last week with breast cancer, stage 1.    Goals Addressed             This Visit's Progress    New diagnosis of breast cancer       Care Coordination Interventions: Assessed patient understanding of cancer diagnosis and recommended treatment plan Reviewed upcoming provider appointments and treatment appointments Assessed available transportation to appointments and treatments. Has consistent/reliable transportation: Yes Assessed support system. Has consistent/reliable family or other support: Yes Reviewed date for lumpectomy.  Educated on process of surgery, will also have lymph nodes removed and biopsy done.  Preadmission test on 11/7, surgery scheduled for 11/15 Discussed having medical clearance from PCP, daughter called office to have forms completed Discussed post op plan - radiation and endocrine therapy for the next 5 years Advised of contact person for oncology/nurse navigator       COMPLETED: RNCM: Effective Management of DM   On track    Care Coordination Interventions:  Lab Results  Component Value Date   HGBA1C 6.1 (H) 04/11/2022    Provided education to patient about basic DM disease process. 04-22-2022: The patient wants to maintain control of her DM. She does not wish to be on "a shot". She wants to learn what she can so she will not have to have insulin. Her husband who is now deceased had to take insulin. He passed away in 09-30-2020. She has lost weight since her physical last year and she is watching what she eats.  Reviewed medications with patient and discussed importance of medication adherence. 04-22-2022: The patient is adherent to her  medications Counseled on importance of regular laboratory monitoring as prescribed. 04-22-2022: Has regular labwork. Is compliant with A1C testing Discussed plans with patient for ongoing care management follow up and provided patient with direct contact information for care management team Provided patient with written educational materials related to hypo and hyperglycemia and importance of correct treatment. 04-22-2022: Review of the sx and sx of hypo and hyperglycemia Reviewed scheduled/upcoming provider appointments including: patient saw the pcp on 04-11-2022, knows to call for changes or new needs Review of patient status, including review of consultants reports, relevant laboratory and other test results, and medications completed Screening for signs and symptoms of depression related to chronic disease state  Assessed social determinant of health barriers AWV completed on 04-11-2022           SDOH assessments and interventions completed:  No     Care Coordination Interventions Activated:  Yes  Care Coordination Interventions:  Yes, provided   Follow up plan: Follow up call scheduled for 11/17    Encounter Outcome:  Pt. Visit Completed   Valente David, RN, MSN, [place] Care Management Care Management Coordinator [phone]

## 2022-07-02 NOTE — Telephone Encounter (Signed)
Called daughter, [name], they have been informed of Pre-Admission date/time, and Surgery date at [facility].  Surgery Date: 07/17/22, patient is to arrive at 11:30 am at [facility] as SLN bx injection will be done first before the surgery.    Preadmission Testing Date: 07/09/22 (phone 8a-1p)  They are also reminded of RF tag placement to be done at [place] [facility] on 07/11/22.

## 2022-07-04 NOTE — Progress Notes (Unsigned)
Medical Clearance received from Elise Payne, FNP. The patient is cleared at Medium risk for surgery.  ?

## 2022-07-08 ENCOUNTER — Encounter: Payer: Self-pay | Admitting: *Deleted

## 2022-07-08 ENCOUNTER — Telehealth: Payer: Self-pay | Admitting: Family Medicine

## 2022-07-08 ENCOUNTER — Other Ambulatory Visit: Payer: Self-pay

## 2022-07-08 DIAGNOSIS — R197 Diarrhea, unspecified: Secondary | ICD-10-CM

## 2022-07-08 MED ORDER — LOPERAMIDE HCL 2 MG PO CAPS: 2.0000 mg | ORAL_CAPSULE | Freq: Four times a day (QID) | ORAL | 1 refills | Status: DC | PRN

## 2022-07-08 NOTE — Telephone Encounter (Signed)
Medication Refill - Medication: loperamide (IMODIUM) 2 MG capsule   Has the patient contacted their pharmacy? Yes.   (Agent: If no, request that the patient contact the pharmacy for the refill. If patient does not wish to contact the pharmacy document the reason why and proceed with request.) (Agent: If yes, when and what did the pharmacy advise?)  Preferred Pharmacy (with phone number or street name):   [facility] [place], [place] [facility]  [address] [place] [address]  Phone: [phone] Fax: [phone]   Has the patient been seen for an appointment in the last year OR does the patient have an upcoming appointment? Yes.    Agent: Please be advised that RX refills may take up to 3 business days

## 2022-07-08 NOTE — Progress Notes (Signed)
Daughter called with several questions about oncotype testing, possible chemo, radiation.   All questions answered to her satisfaction.

## 2022-07-09 ENCOUNTER — Other Ambulatory Visit: Payer: Self-pay

## 2022-07-09 ENCOUNTER — Encounter
Admission: RE | Admit: 2022-07-09 | Discharge: 2022-07-09 | Disposition: A | Discharge status: Home / Self Care | Payer: Medicare Other | Source: Ambulatory Visit | Attending: Surgery | Admitting: Surgery

## 2022-07-09 DIAGNOSIS — Z01812 Encounter for preprocedural laboratory examination: Secondary | ICD-10-CM | POA: Insufficient documentation

## 2022-07-09 DIAGNOSIS — C50412 Malignant neoplasm of upper-outer quadrant of left female breast: Secondary | ICD-10-CM | POA: Insufficient documentation

## 2022-07-09 DIAGNOSIS — Z170 Estrogen receptor positive status [ER+]: Secondary | ICD-10-CM | POA: Insufficient documentation

## 2022-07-09 HISTORY — DX: Depression, unspecified: F32.A

## 2022-07-09 HISTORY — DX: Chronic obstructive pulmonary disease, unspecified: J44.9

## 2022-07-09 HISTORY — DX: Type 2 diabetes mellitus without complications: E11.9

## 2022-07-09 HISTORY — DX: Gastro-esophageal reflux disease without esophagitis: K21.9

## 2022-07-09 HISTORY — DX: Pneumonia, unspecified organism: J18.9

## 2022-07-09 HISTORY — DX: Dyspnea, unspecified: R06.00

## 2022-07-09 HISTORY — DX: Malignant (primary) neoplasm, unspecified: C80.1

## 2022-07-09 NOTE — Patient Instructions (Addendum)
Your procedure is scheduled on: 07/17/22 - Wednesday Report to the Registration Desk on the 1st floor of the [facility]. To find out your arrival time, please call [phone] between 1PM - 3PM on: 07/18/22 - Tuesday If your arrival time is 6:00 am, do not arrive prior to that time as the [facility] entrance doors do not open until 6:00 am.  REMEMBER: Instructions that are not followed completely may result in serious medical risk, up to and including death; or upon the discretion of your surgeon and anesthesiologist your surgery may need to be rescheduled.  Do not eat food after midnight the night before surgery.  No gum chewing, lozengers or hard candies.  You may however, drink CLEAR liquids up to 2 hours before you are scheduled to arrive for your surgery. Do not drink anything within 2 hours of your scheduled arrival time.  Type 1 and Type 2 diabetics should only drink water.  TAKE THESE MEDICATIONS THE MORNING OF SURGERY WITH A SIP OF WATER:  - Albuterol Sulfate (PROAIR RESPICLICK)  - amLODipine (NORVASC)  - famotidine (PEPCID)  - TRELEGY ELLIPTA    HOLD metFORMIN (GLUCOPHAGE-XR) prior to your surgery beginning 07/15/22.  One week prior to surgery: Stop Anti-inflammatories (NSAIDS) such as Advil, Aleve, Ibuprofen, Motrin, Naproxen, Naprosyn and Aspirin based products such as Excedrin, Goodys Powder, BC Powder.  Stop ANY OVER THE COUNTER supplements until after surgery.  You may take Tylenol if needed for pain up until the day of surgery.  No Alcohol for 24 hours before or after surgery.  No Smoking including e-cigarettes for 24 hours prior to surgery.  No chewable tobacco products for at least 6 hours prior to surgery.  No nicotine patches on the day of surgery.  Do not use any "recreational" drugs for at least a week prior to your surgery.  Please be advised that the combination of cocaine and anesthesia may have negative outcomes, up to and including death. If  you test positive for cocaine, your surgery will be cancelled.  On the morning of surgery brush your teeth with toothpaste and water, you may rinse your mouth with mouthwash if you wish. Do not swallow any toothpaste or mouthwash.  Use CHG Soap or wipes as directed on instruction sheet.  Do not wear jewelry, make-up, hairpins, clips or nail polish.  Do not wear lotions, powders, or perfumes.   Do not shave body from the neck down 48 hours prior to surgery just in case you cut yourself which could leave a site for infection.  Also, freshly shaved skin may become irritated if using the CHG soap.  Contact lenses, hearing aids and dentures may not be worn into surgery.  Do not bring valuables to the hospital. [facility] is not responsible for any missing/lost belongings or valuables.   Notify your doctor if there is any change in your medical condition (cold, fever, infection).  Wear comfortable clothing (specific to your surgery type) to the hospital.  After surgery, you can help prevent lung complications by doing breathing exercises.  Take deep breaths and cough every 1-2 hours. Your doctor may order a device called an Incentive Spirometer to help you take deep breaths. When coughing or sneezing, hold a pillow firmly against your incision with both hands. This is called "splinting." Doing this helps protect your incision. It also decreases belly discomfort.  If you are being admitted to the hospital overnight, leave your suitcase in the car. After surgery it may be brought  to your room.  If you are being discharged the day of surgery, you will not be allowed to drive home. You will need a responsible adult (18 years or older) to drive you home and stay with you that night.   If you are taking public transportation, you will need to have a responsible adult (18 years or older) with you. Please confirm with your physician that it is acceptable to use public transportation.   Please  call the [facility] Dept. at [phone] if you have any questions about these instructions.  Surgery Visitation Policy:  Patients undergoing a surgery or procedure may have two family members or support persons with them as long as the person is not COVID-19 positive or experiencing its symptoms.   Inpatient Visitation:    Visiting hours are 7 a.m. to 8 p.m. Up to four visitors are allowed at one time in a patient room, including children. The visitors may rotate out with other people during the day. One designated support person (adult) may remain overnight.

## 2022-07-11 ENCOUNTER — Ambulatory Visit
Admission: RE | Admit: 2022-07-11 | Discharge: 2022-07-11 | Disposition: A | Discharge status: Home / Self Care | Payer: Medicare Other | Source: Ambulatory Visit | Attending: Surgery | Admitting: Surgery

## 2022-07-11 ENCOUNTER — Encounter
Admission: RE | Admit: 2022-07-11 | Discharge: 2022-07-11 | Disposition: A | Discharge status: Home / Self Care | Payer: Medicare Other | Source: Ambulatory Visit | Attending: Surgery | Admitting: Surgery

## 2022-07-11 DIAGNOSIS — R928 Other abnormal and inconclusive findings on diagnostic imaging of breast: Secondary | ICD-10-CM | POA: Diagnosis present

## 2022-07-11 DIAGNOSIS — C50919 Malignant neoplasm of unspecified site of unspecified female breast: Secondary | ICD-10-CM | POA: Insufficient documentation

## 2022-07-11 DIAGNOSIS — Z01812 Encounter for preprocedural laboratory examination: Secondary | ICD-10-CM | POA: Insufficient documentation

## 2022-07-17 ENCOUNTER — Other Ambulatory Visit: Payer: Self-pay

## 2022-07-17 ENCOUNTER — Encounter
Admission: RE | Admit: 2022-07-17 | Discharge: 2022-07-17 | Disposition: A | Discharge status: Home / Self Care | Payer: Medicare Other | Source: Ambulatory Visit | Attending: Surgery | Admitting: Surgery

## 2022-07-17 ENCOUNTER — Ambulatory Visit: Payer: Medicare Other | Admitting: Certified Registered Nurse Anesthetist

## 2022-07-17 ENCOUNTER — Encounter
Admission: RE | Disposition: A | Discharge status: Home / Self Care | Payer: Self-pay | Source: Home / Self Care | Attending: Surgery

## 2022-07-17 ENCOUNTER — Ambulatory Visit
Admission: RE | Admit: 2022-07-17 | Discharge: 2022-07-17 | Disposition: A | Discharge status: Home / Self Care | Payer: Medicare Other | Attending: Surgery | Admitting: Surgery

## 2022-07-17 ENCOUNTER — Ambulatory Visit
Admission: RE | Admit: 2022-07-17 | Discharge: 2022-07-17 | Disposition: A | Discharge status: Home / Self Care | Payer: Medicare Other | Source: Ambulatory Visit | Attending: Surgery | Admitting: Surgery

## 2022-07-17 ENCOUNTER — Encounter: Payer: Self-pay | Admitting: Surgery

## 2022-07-17 DIAGNOSIS — F17210 Nicotine dependence, cigarettes, uncomplicated: Secondary | ICD-10-CM | POA: Diagnosis not present

## 2022-07-17 DIAGNOSIS — J449 Chronic obstructive pulmonary disease, unspecified: Secondary | ICD-10-CM | POA: Diagnosis not present

## 2022-07-17 DIAGNOSIS — Z01818 Encounter for other preprocedural examination: Secondary | ICD-10-CM | POA: Diagnosis present

## 2022-07-17 DIAGNOSIS — E119 Type 2 diabetes mellitus without complications: Secondary | ICD-10-CM | POA: Insufficient documentation

## 2022-07-17 DIAGNOSIS — I10 Essential (primary) hypertension: Secondary | ICD-10-CM | POA: Diagnosis not present

## 2022-07-17 DIAGNOSIS — E785 Hyperlipidemia, unspecified: Secondary | ICD-10-CM | POA: Diagnosis not present

## 2022-07-17 DIAGNOSIS — C50412 Malignant neoplasm of upper-outer quadrant of left female breast: Secondary | ICD-10-CM

## 2022-07-17 DIAGNOSIS — Z170 Estrogen receptor positive status [ER+]: Secondary | ICD-10-CM | POA: Diagnosis not present

## 2022-07-17 DIAGNOSIS — R928 Other abnormal and inconclusive findings on diagnostic imaging of breast: Secondary | ICD-10-CM

## 2022-07-17 DIAGNOSIS — Z7951 Long term (current) use of inhaled steroids: Secondary | ICD-10-CM | POA: Diagnosis not present

## 2022-07-17 DIAGNOSIS — C50912 Malignant neoplasm of unspecified site of left female breast: Secondary | ICD-10-CM | POA: Insufficient documentation

## 2022-07-17 DIAGNOSIS — K219 Gastro-esophageal reflux disease without esophagitis: Secondary | ICD-10-CM | POA: Diagnosis not present

## 2022-07-17 DIAGNOSIS — Z01812 Encounter for preprocedural laboratory examination: Secondary | ICD-10-CM

## 2022-07-17 DIAGNOSIS — C50919 Malignant neoplasm of unspecified site of unspecified female breast: Secondary | ICD-10-CM

## 2022-07-17 HISTORY — PX: BREAST LUMPECTOMY: SHX2

## 2022-07-17 HISTORY — PX: BREAST LUMPECTOMY,RADIO FREQ LOCALIZER,AXILLARY SENTINEL LYMPH NODE BIOPSY: SHX6900

## 2022-07-17 LAB — GLUCOSE, CAPILLARY
Glucose-Capillary: 119 mg/dL — ABNORMAL HIGH (ref 70–99)
Glucose-Capillary: 173 mg/dL — ABNORMAL HIGH (ref 70–99)

## 2022-07-17 SURGERY — BREAST LUMPECTOMY,RADIO FREQ LOCALIZER,AXILLARY SENTINEL LYMPH NODE BIOPSY
Anesthesia: General | Site: Breast | Laterality: Left

## 2022-07-17 MED ORDER — LACTATED RINGERS IV SOLN: INTRAVENOUS | Status: DC | PRN

## 2022-07-17 MED ORDER — BUPIVACAINE-EPINEPHRINE (PF) 0.5% -1:200000 IJ SOLN
INTRAMUSCULAR | Status: DC | PRN
  Administered 2022-07-17: 25 mL via INTRAMUSCULAR
  Administered 2022-07-17: 15 mL via INTRAMUSCULAR

## 2022-07-17 MED ORDER — BUPIVACAINE LIPOSOME 1.3 % IJ SUSP: 20.0000 mL | Freq: Once | INTRAMUSCULAR | Status: DC

## 2022-07-17 MED ORDER — CEFAZOLIN SODIUM-DEXTROSE 2-4 GM/100ML-% IV SOLN
INTRAVENOUS | Status: AC
  Filled 2022-07-17: qty 100

## 2022-07-17 MED ORDER — STERILE WATER FOR IRRIGATION IR SOLN
Status: DC | PRN
  Administered 2022-07-17: 500 mL

## 2022-07-17 MED ORDER — EPHEDRINE SULFATE (PRESSORS) 50 MG/ML IJ SOLN
INTRAMUSCULAR | Status: DC | PRN
  Administered 2022-07-17 (×3): 5 mg via INTRAVENOUS

## 2022-07-17 MED ORDER — TECHNETIUM TC 99M TILMANOCEPT KIT
1.0000 | PACK | Freq: Once | INTRAVENOUS | Status: AC | PRN
  Administered 2022-07-17: 1.095 via INTRADERMAL

## 2022-07-17 MED ORDER — ROCURONIUM BROMIDE 10 MG/ML (PF) SYRINGE
PREFILLED_SYRINGE | INTRAVENOUS | Status: AC
  Filled 2022-07-17: qty 10

## 2022-07-17 MED ORDER — CHLORHEXIDINE GLUCONATE 0.12 % MT SOLN: 15.0000 mL | Freq: Once | OROMUCOSAL | Status: AC

## 2022-07-17 MED ORDER — MIDAZOLAM HCL 2 MG/2ML IJ SOLN
INTRAMUSCULAR | Status: AC
  Filled 2022-07-17: qty 2

## 2022-07-17 MED ORDER — PROPOFOL 10 MG/ML IV BOLUS
INTRAVENOUS | Status: DC | PRN
  Administered 2022-07-17: 30 mg via INTRAVENOUS
  Administered 2022-07-17: 100 mg via INTRAVENOUS

## 2022-07-17 MED ORDER — ACETAMINOPHEN 500 MG PO TABS: 1000.0000 mg | ORAL_TABLET | ORAL | Status: AC

## 2022-07-17 MED ORDER — GLYCOPYRROLATE 0.2 MG/ML IJ SOLN
INTRAMUSCULAR | Status: DC | PRN
  Administered 2022-07-17: .1 mg via INTRAVENOUS

## 2022-07-17 MED ORDER — SUCCINYLCHOLINE CHLORIDE 200 MG/10ML IV SOSY
PREFILLED_SYRINGE | INTRAVENOUS | Status: DC | PRN
  Administered 2022-07-17: 80 mg via INTRAVENOUS

## 2022-07-17 MED ORDER — SODIUM CHLORIDE 0.9 % IV SOLN: INTRAVENOUS | Status: DC

## 2022-07-17 MED ORDER — PHENYLEPHRINE HCL (PRESSORS) 10 MG/ML IV SOLN
INTRAVENOUS | Status: AC
  Filled 2022-07-17: qty 1

## 2022-07-17 MED ORDER — ACETAMINOPHEN 500 MG PO TABS: 1000.0000 mg | ORAL_TABLET | Freq: Four times a day (QID) | ORAL | Status: DC | PRN

## 2022-07-17 MED ORDER — ACETAMINOPHEN 10 MG/ML IV SOLN: 1000.0000 mg | Freq: Once | INTRAVENOUS | Status: DC | PRN

## 2022-07-17 MED ORDER — ORAL CARE MOUTH RINSE: 15.0000 mL | Freq: Once | OROMUCOSAL | Status: AC

## 2022-07-17 MED ORDER — PROPOFOL 500 MG/50ML IV EMUL
INTRAVENOUS | Status: DC | PRN
  Administered 2022-07-17: 30 ug/kg/min via INTRAVENOUS

## 2022-07-17 MED ORDER — SODIUM CHLORIDE (PF) 0.9 % IJ SOLN
INTRAVENOUS | Status: DC | PRN
  Administered 2022-07-17: 5 mL via INTRAMUSCULAR

## 2022-07-17 MED ORDER — CHLORHEXIDINE GLUCONATE CLOTH 2 % EX PADS: 6.0000 | MEDICATED_PAD | Freq: Once | CUTANEOUS | Status: DC

## 2022-07-17 MED ORDER — OXYCODONE HCL 5 MG PO TABS
5.0000 mg | ORAL_TABLET | Freq: Once | ORAL | Status: AC | PRN
  Administered 2022-07-17: 5 mg via ORAL

## 2022-07-17 MED ORDER — GABAPENTIN 300 MG PO CAPS: 300.0000 mg | ORAL_CAPSULE | ORAL | Status: AC

## 2022-07-17 MED ORDER — FENTANYL CITRATE (PF) 100 MCG/2ML IJ SOLN
INTRAMUSCULAR | Status: AC
  Administered 2022-07-17: 25 ug via INTRAVENOUS
  Filled 2022-07-17: qty 2

## 2022-07-17 MED ORDER — PROPOFOL 10 MG/ML IV BOLUS
INTRAVENOUS | Status: AC
  Filled 2022-07-17: qty 20

## 2022-07-17 MED ORDER — DEXAMETHASONE SODIUM PHOSPHATE 10 MG/ML IJ SOLN
INTRAMUSCULAR | Status: DC | PRN
  Administered 2022-07-17: 10 mg via INTRAVENOUS

## 2022-07-17 MED ORDER — PHENYLEPHRINE HCL-NACL 20-0.9 MG/250ML-% IV SOLN
INTRAVENOUS | Status: AC
  Filled 2022-07-17: qty 250

## 2022-07-17 MED ORDER — ROCURONIUM BROMIDE 100 MG/10ML IV SOLN
INTRAVENOUS | Status: DC | PRN
  Administered 2022-07-17: 10 mg via INTRAVENOUS

## 2022-07-17 MED ORDER — ACETAMINOPHEN 500 MG PO TABS
ORAL_TABLET | ORAL | Status: AC
  Administered 2022-07-17: 1000 mg via ORAL
  Filled 2022-07-17: qty 2

## 2022-07-17 MED ORDER — BUPIVACAINE LIPOSOME 1.3 % IJ SUSP
INTRAMUSCULAR | Status: AC
  Filled 2022-07-17: qty 10

## 2022-07-17 MED ORDER — ONDANSETRON HCL 4 MG/2ML IJ SOLN: 4.0000 mg | Freq: Once | INTRAMUSCULAR | Status: DC | PRN

## 2022-07-17 MED ORDER — FENTANYL CITRATE (PF) 100 MCG/2ML IJ SOLN
25.0000 ug | INTRAMUSCULAR | Status: DC | PRN
  Administered 2022-07-17 (×3): 25 ug via INTRAVENOUS

## 2022-07-17 MED ORDER — FENTANYL CITRATE (PF) 100 MCG/2ML IJ SOLN
INTRAMUSCULAR | Status: DC | PRN
  Administered 2022-07-17 (×2): 50 ug via INTRAVENOUS

## 2022-07-17 MED ORDER — SUGAMMADEX SODIUM 500 MG/5ML IV SOLN
INTRAVENOUS | Status: DC | PRN
  Administered 2022-07-17: 100 mg via INTRAVENOUS

## 2022-07-17 MED ORDER — FENTANYL CITRATE (PF) 100 MCG/2ML IJ SOLN
INTRAMUSCULAR | Status: AC
  Filled 2022-07-17: qty 2

## 2022-07-17 MED ORDER — PHENYLEPHRINE 80 MCG/ML (10ML) SYRINGE FOR IV PUSH (FOR BLOOD PRESSURE SUPPORT)
PREFILLED_SYRINGE | INTRAVENOUS | Status: DC | PRN
  Administered 2022-07-17: 80 ug via INTRAVENOUS
  Administered 2022-07-17 (×2): 40 ug via INTRAVENOUS
  Administered 2022-07-17: 80 ug via INTRAVENOUS

## 2022-07-17 MED ORDER — KETOROLAC TROMETHAMINE 30 MG/ML IJ SOLN
INTRAMUSCULAR | Status: DC | PRN
  Administered 2022-07-17: 30 mg via INTRAVENOUS

## 2022-07-17 MED ORDER — CEFAZOLIN SODIUM-DEXTROSE 2-4 GM/100ML-% IV SOLN
2.0000 g | INTRAVENOUS | Status: AC
  Administered 2022-07-17: 2 g via INTRAVENOUS

## 2022-07-17 MED ORDER — MIDAZOLAM HCL 2 MG/2ML IJ SOLN
INTRAMUSCULAR | Status: DC | PRN
  Administered 2022-07-17: 2 mg via INTRAVENOUS

## 2022-07-17 MED ORDER — IBUPROFEN 600 MG PO TABS: 600.0000 mg | ORAL_TABLET | Freq: Three times a day (TID) | ORAL | 1 refills | Status: DC | PRN

## 2022-07-17 MED ORDER — OXYCODONE HCL 5 MG/5ML PO SOLN: 5.0000 mg | Freq: Once | ORAL | Status: AC | PRN

## 2022-07-17 MED ORDER — OXYCODONE HCL 5 MG PO TABS: 5.0000 mg | ORAL_TABLET | ORAL | 0 refills | Status: DC | PRN

## 2022-07-17 MED ORDER — PROPOFOL 10 MG/ML IV BOLUS
INTRAVENOUS | Status: AC
  Filled 2022-07-17: qty 40

## 2022-07-17 MED ORDER — BUPIVACAINE-EPINEPHRINE (PF) 0.5% -1:200000 IJ SOLN
INTRAMUSCULAR | Status: AC
  Filled 2022-07-17: qty 30

## 2022-07-17 MED ORDER — PHENYLEPHRINE HCL-NACL 20-0.9 MG/250ML-% IV SOLN
INTRAVENOUS | Status: DC | PRN
  Administered 2022-07-17: 20 ug/min via INTRAVENOUS

## 2022-07-17 MED ORDER — METHYLENE BLUE 1 % INJ SOLN
INTRAVENOUS | Status: AC
  Filled 2022-07-17: qty 10

## 2022-07-17 MED ORDER — ONDANSETRON HCL 4 MG/2ML IJ SOLN
INTRAMUSCULAR | Status: DC | PRN
  Administered 2022-07-17: 4 mg via INTRAVENOUS

## 2022-07-17 MED ORDER — LIDOCAINE IN D5W 4-5 MG/ML-% IV SOLN
INTRAVENOUS | Status: DC | PRN
  Administered 2022-07-17: 1.2 mg/min via INTRAVENOUS

## 2022-07-17 MED ORDER — CHLORHEXIDINE GLUCONATE 0.12 % MT SOLN
OROMUCOSAL | Status: AC
  Administered 2022-07-17: 15 mL via OROMUCOSAL
  Filled 2022-07-17: qty 15

## 2022-07-17 MED ORDER — OXYCODONE HCL 5 MG PO TABS
ORAL_TABLET | ORAL | Status: AC
  Filled 2022-07-17: qty 1

## 2022-07-17 MED ORDER — GABAPENTIN 300 MG PO CAPS
ORAL_CAPSULE | ORAL | Status: AC
  Administered 2022-07-17: 300 mg via ORAL
  Filled 2022-07-17: qty 1

## 2022-07-17 MED ORDER — LIDOCAINE HCL (CARDIAC) PF 100 MG/5ML IV SOSY
PREFILLED_SYRINGE | INTRAVENOUS | Status: DC | PRN
  Administered 2022-07-17: 70 mg via INTRAVENOUS

## 2022-07-17 SURGICAL SUPPLY — 60 items
ADH SKN CLS APL DERMABOND .7 (GAUZE/BANDAGES/DRESSINGS) ×1
APL PRP STRL LF DISP 70% ISPRP (MISCELLANEOUS) ×1
APPLIER CLIP 9.375 SM OPEN (CLIP)
APR CLP SM 9.3 20 MLT OPN (CLIP)
BINDER BREAST LRG (GAUZE/BANDAGES/DRESSINGS) IMPLANT
BINDER BREAST MEDIUM (GAUZE/BANDAGES/DRESSINGS) IMPLANT
BLADE PHOTON ILLUMINATED (MISCELLANEOUS) ×1 IMPLANT
BLADE SURG 15 STRL LF DISP TIS (BLADE) ×2 IMPLANT
BLADE SURG 15 STRL SS (BLADE) ×2
CHLORAPREP W/TINT 26 (MISCELLANEOUS) ×1 IMPLANT
CLIP APPLIE 9.375 SM OPEN (CLIP) IMPLANT
CNTNR SPEC 2.5X3XGRAD LEK (MISCELLANEOUS) ×1
CONT SPEC 4OZ STER OR WHT (MISCELLANEOUS) ×1
CONT SPEC 4OZ STRL OR WHT (MISCELLANEOUS) ×1
CONTAINER SPEC 2.5X3XGRAD LEK (MISCELLANEOUS) ×1 IMPLANT
COVER PROBE FLX POLY STRL (MISCELLANEOUS) ×1 IMPLANT
COVER PROBE GAMMA FINDER SLV (MISCELLANEOUS) IMPLANT
DERMABOND ADVANCED .7 DNX12 (GAUZE/BANDAGES/DRESSINGS) ×1 IMPLANT
DEVICE DUBIN SPECIMEN MAMMOGRA (MISCELLANEOUS) ×1 IMPLANT
DRAPE LAPAROTOMY 100X77 ABD (DRAPES) ×1 IMPLANT
DRSG GAUZE FLUFF 36X18 (GAUZE/BANDAGES/DRESSINGS) ×1 IMPLANT
ELECT CAUTERY BLADE TIP 2.5 (TIP) ×1
ELECT REM PT RETURN 9FT ADLT (ELECTROSURGICAL) ×1
ELECTRODE CAUTERY BLDE TIP 2.5 (TIP) ×1 IMPLANT
ELECTRODE REM PT RTRN 9FT ADLT (ELECTROSURGICAL) ×1 IMPLANT
GAUZE 4X4 16PLY ~~LOC~~+RFID DBL (SPONGE) ×1 IMPLANT
GLOVE SURG SYN 7.0 (GLOVE) ×1 IMPLANT
GLOVE SURG SYN 7.0 PF PI (GLOVE) ×1 IMPLANT
GLOVE SURG SYN 7.5  E (GLOVE) ×1
GLOVE SURG SYN 7.5 E (GLOVE) ×1 IMPLANT
GLOVE SURG SYN 7.5 PF PI (GLOVE) ×1 IMPLANT
GOWN STRL REUS W/ TWL LRG LVL3 (GOWN DISPOSABLE) ×2 IMPLANT
GOWN STRL REUS W/TWL LRG LVL3 (GOWN DISPOSABLE) ×2
KIT MARKER MARGIN INK (KITS) IMPLANT
KIT TURNOVER KIT A (KITS) ×1 IMPLANT
LABEL OR SOLS (LABEL) ×1 IMPLANT
MANIFOLD NEPTUNE II (INSTRUMENTS) ×1 IMPLANT
MARGIN MAP 10MM (MISCELLANEOUS) IMPLANT
MARKER MARGIN CORRECT CLIP (MARKER) IMPLANT
NDL HYPO 25X1 1.5 SAFETY (NEEDLE) ×1 IMPLANT
NEEDLE HYPO 22GX1.5 SAFETY (NEEDLE) ×1 IMPLANT
NEEDLE HYPO 25X1 1.5 SAFETY (NEEDLE) ×1 IMPLANT
PACK BASIN MINOR ARMC (MISCELLANEOUS) ×1 IMPLANT
SET LOCALIZER 20 PROBE US (MISCELLANEOUS) ×1 IMPLANT
SUT ETHILON 3-0 FS-10 30 BLK (SUTURE)
SUT MNCRL 4-0 (SUTURE) ×1
SUT MNCRL 4-0 27XMFL (SUTURE) ×1
SUT SILK 3 0 SH 30 (SUTURE) ×1 IMPLANT
SUT VIC AB 2-0 SH 27 (SUTURE) ×1
SUT VIC AB 2-0 SH 27XBRD (SUTURE) IMPLANT
SUT VIC AB 3-0 SH 27 (SUTURE) ×2
SUT VIC AB 3-0 SH 27X BRD (SUTURE) ×1 IMPLANT
SUTURE EHLN 3-0 FS-10 30 BLK (SUTURE) IMPLANT
SUTURE MNCRL 4-0 27XMF (SUTURE) ×1 IMPLANT
SYR 10ML LL (SYRINGE) ×1 IMPLANT
SYR BULB IRRIG 60ML STRL (SYRINGE) ×1 IMPLANT
TAPE TRANSPORE STRL 2 31045 (GAUZE/BANDAGES/DRESSINGS) IMPLANT
TRAP FLUID SMOKE EVACUATOR (MISCELLANEOUS) ×1 IMPLANT
TRAP NEPTUNE SPECIMEN COLLECT (MISCELLANEOUS) ×1 IMPLANT
WATER STERILE IRR 500ML POUR (IV SOLUTION) ×1 IMPLANT

## 2022-07-17 NOTE — Anesthesia Preprocedure Evaluation (Addendum)
Anesthesia Evaluation  Patient identified by MRN, date of birth, ID band Patient awake    Reviewed: Allergy & Precautions, H&P , NPO status , Patient's Chart, lab work & pertinent test results, reviewed documented beta blocker date and time   History of Anesthesia Complications Negative for: history of anesthetic complications  Airway Mallampati: II  TM Distance: >3 FB Neck ROM: full    Dental  (+) Edentulous Upper, Edentulous Lower   Pulmonary asthma , neg sleep apnea, COPD,  COPD inhaler, Current Smoker and Patient abstained from smoking. Well controlled copd, never hospitalized, took her trelegy today   Pulmonary exam normal breath sounds clear to auscultation       Cardiovascular Exercise Tolerance: Good METShypertension, Pt. on medications (-) CAD and (-) Past MI (-) dysrhythmias  Rhythm:regular Rate:Normal  HLD   Neuro/Psych  PSYCHIATRIC DISORDERS Anxiety Depression    negative neurological ROS     GI/Hepatic Neg liver ROS,GERD  Controlled and Medicated,,  Endo/Other  diabetes    Renal/GU negative Renal ROS  negative genitourinary   Musculoskeletal   Abdominal   Peds  Hematology negative hematology ROS (+)   Anesthesia Other Findings Past Medical History: No date: Anxiety No date: Cancer ([facility] [place]) No date: Carbuncle and furuncle of trunk No date: COPD (chronic obstructive pulmonary disease) (HCC) No date: Depression No date: Diabetes mellitus without complication (HCC) No date: Dyspnea No date: GERD (gastroesophageal reflux disease) No date: HTN (hypertension) No date: Hyperlipidemia No date: Intrinsic asthma, unspecified No date: Methicillin resistant Staphylococcus aureus in conditions  classified elsewhere and of unspecified site No date: Other psychological or physical stress, not elsewhere  classified(V62.89) No date: Pneumonia No date: Pre-diabetes No date: Tobacco abuse   Reproductive/Obstetrics negative OB ROS                             Anesthesia Physical Anesthesia Plan  ASA: 2  Anesthesia Plan: General   Post-op Pain Management: Gabapentin PO (pre-op)* and Tylenol PO (pre-op)*   Induction: Intravenous  PONV Risk Score and Plan: 2 and 3 and Treatment may vary due to age or medical condition, Midazolam, Dexamethasone and Ondansetron  Airway Management Planned: Oral ETT  Additional Equipment: None  Intra-op Plan:   Post-operative Plan: Extubation in OR  Informed Consent: I have reviewed the patients History and Physical, chart, labs and discussed the procedure including the risks, benefits and alternatives for the proposed anesthesia with the patient or authorized representative who has indicated his/her understanding and acceptance.     Dental Advisory Given  Plan Discussed with: CRNA  Anesthesia Plan Comments: (Discussed risks of anesthesia with patient, including PONV, sore throat, lip/dental/eye damage. Rare risks discussed as well, such as cardiorespiratory and neurological sequelae, and allergic reactions. Discussed the role of CRNA in patient's perioperative care. Patient understands. Patient counseled on benefits of smoking cessation, and increased perioperative risks associated with continued smoking. )        Anesthesia Quick Evaluation

## 2022-07-17 NOTE — Anesthesia Procedure Notes (Signed)
Procedure Name: Intubation Date/Time: 07/17/2022 1:34 PM  Performed by: Adalberto Ill, CRNAPre-anesthesia Checklist: Patient identified, Emergency Drugs available, Suction available, Patient being monitored and Timeout performed Patient Re-evaluated:Patient Re-evaluated prior to induction Oxygen Delivery Method: Circle system utilized Preoxygenation: Pre-oxygenation with 100% oxygen Induction Type: IV induction Ventilation: Mask ventilation without difficulty Laryngoscope Size: Miller and 2 Grade View: Grade I Tube type: Oral Tube size: 6.5 mm Number of attempts: 1 (brief atrumatic) Airway Equipment and Method: Stylet Placement Confirmation: ETT inserted through vocal cords under direct vision, positive ETCO2 and breath sounds checked- equal and bilateral Secured at: 20 cm Tube secured with: Tape Dental Injury: Teeth and Oropharynx as per pre-operative assessment

## 2022-07-17 NOTE — Interval H&P Note (Signed)
History and Physical Interval Note:  07/17/2022 1:13 PM  [name]  has presented today for surgery, with the diagnosis of left breast cancer.  The various methods of treatment have been discussed with the patient and family. After consideration of risks, benefits and other options for treatment, the patient has consented to  Procedure(s): [name] (Left) as a surgical intervention.  The patient's history has been reviewed, patient examined, no change in status, stable for surgery.  I have reviewed the patient's chart and labs.  Questions were answered to the patient's satisfaction.     Alexandria [name]

## 2022-07-17 NOTE — Anesthesia Postprocedure Evaluation (Signed)
Anesthesia Post Note  Patient: Alexandria [name]  Procedure(s) Performed: BREAST [place] SENTINEL LYMPH NODE BIOPSY (Left: Breast)  Patient location during evaluation: PACU Anesthesia Type: General Level of consciousness: awake and alert, oriented and patient cooperative Pain management: pain level controlled Vital Signs Assessment: post-procedure vital signs reviewed and stable Respiratory status: spontaneous breathing, nonlabored ventilation and respiratory function stable Cardiovascular status: blood pressure returned to baseline and stable Postop Assessment: adequate PO intake Anesthetic complications: no   No notable events documented.   Last Vitals:  Vitals:   07/17/22 1700 07/17/22 1715  BP: (!) 116/56 124/72  Pulse: 73 77  Resp: (!) 25 12  Temp:    SpO2: 100% 94%    Last Pain:  Vitals:   07/17/22 1715  TempSrc:   PainSc: [place]

## 2022-07-17 NOTE — Op Note (Signed)
Procedure Date:  07/17/2022  Pre-operative Diagnosis:  Left breast cancer  Post-operative Diagnosis: Left breast cancer  Procedure:  Left breast RF tag-localized lumpectomy and sentinel lymph node biopsy  Surgeon:  Melvyn Neth, MD  Anesthesia:  General endotracheal  Estimated Blood Loss:  20 ml  Specimens:   Sentinel lymph node #1, hot/blue, count 2922 Sentinel lymph node #2, hot/blue, count 1398 Sentinel lymph node #3, count 26 Left breast mass Left breast new lateral margin  Complications:  None  Operation performed with curative intent:Yes  Tracer(s) used to identify sentinel nodes in the upfront surgery (non-neoadjuvant) setting (select all that apply):Dye and Radioactive Tracer  All nodes (colored or non-colored) present at the end of a dye-filled lymphatic channel were removed:Yes   All significantly radioactive nodes were removed:Yes  All palpable suspicious nodes were removed:Yes  Indications for Procedure:  This is a 66 y.o. female who presents with new diagnosis of left breast cancer.  The risks of bleeding, infection, injury to surrounding structures, hematoma, seroma, open wound, cosmetic deformity, and the need for further surgery were all discussed with the patient and was willing to proceed.  Prior to this procedure, the patient had undergone RF tag localization and sentinel lymphoscintigraphy.  Description of Procedure: The patient was correctly identified in the preoperative area and brought into the operating room.  The patient was placed supine with VTE prophylaxis in place.  Appropriate time-outs were performed.  Anesthesia was induced and the patient was intubated.  Appropriate antibiotics were infused.  A visual dye was injected in the left periareolar region under aseptic conditions. The left chest and axilla were prepped and draped in usual sterile fashion.  Then using the hand-held probe an area of high counts was identified in the axilla, and a  4 cm incision was made.  Cautery was used to dissect down the subcutaneous tissue and the hand-held probe was used to guide dissection. A hot and blue lymph node was identified and resected.  This had a count of 2922.  Additional lymph nodes were identified and resected with counts of 1398 and 26.  The cavity was irrigated and hemostasis was assured with electrocautery.  Local anesthetic was infiltrated into the skin and subcutaneous tissue of the cavity.  The wound was then closed in three layers with 3-0 Vicryl and 4-0 Monocryl and sealed with DermaBond.  Attention was turned to the RF tag localization site in the upper outer quadrant which was determined using the Hologic probe.  A 5 cm incision was made overlying the tag and clip.  Hologic probe was used to guide our dissection using electrocautery, and a partial mastectomy was performed.  Upon palpation of the specimen, the lateral margin had an area that felt firm and it was decided to take an additional specimen to clear the lateral margin.  MarginMarker was used to ink each of the sides of both specimens.  The first specimen was then imaged to confirm that the area of concern, biopsy clip, and RF tag were included in the excision.  Both were then sent to pathology.  The cavity was irrigated and hemostasis was assured with electrocautery.  Local anesthetic was infiltrated into the skin and subcutaneous tissue of the cavity.  The wound was then closed in three layers with 2-0 Vicryl, 3-0 Vicryl and 4-0 Monocryl and sealed with DermaBond.  Breast binder was placed.  The patient was emerged from anesthesia and extubated and brought to the recovery room for further management.  The  patient tolerated the procedure well and all counts were correct at the end of the case.   Melvyn Neth, MD

## 2022-07-17 NOTE — Transfer of Care (Signed)
Immediate Anesthesia Transfer of Care Note  Patient: Alexandria [name]  Procedure(s) Performed: BREAST [place] SENTINEL LYMPH NODE BIOPSY (Left: Breast)  Patient Location: PACU  Anesthesia Type:General  Level of Consciousness: drowsy  Airway & Oxygen Therapy: Patient Spontanous Breathing and Patient connected to face mask oxygen  Post-op Assessment: Report given to RN and Post -op Vital signs reviewed and stable  Post vital signs: Reviewed and stable  Last Vitals:  Vitals Value Taken Time  BP 126/53 07/17/22 1615  Temp 36.1 C 07/17/22 1615  Pulse 69 07/17/22 1619  Resp 17 07/17/22 1619  SpO2 100 % 07/17/22 1619  Vitals shown include unvalidated device data.  Last Pain:  Vitals:   07/17/22 1615  TempSrc:   PainSc: Asleep         Complications: No notable events documented.report given to PACU RN all questions answered oral airway in patient vital signs stable tolerating well.

## 2022-07-17 NOTE — Discharge Instructions (Signed)

## 2022-07-18 ENCOUNTER — Encounter: Payer: Self-pay | Admitting: Surgery

## 2022-07-19 ENCOUNTER — Other Ambulatory Visit: Payer: Self-pay | Admitting: Anatomic Pathology & Clinical Pathology

## 2022-07-19 ENCOUNTER — Ambulatory Visit: Payer: Self-pay | Admitting: *Deleted

## 2022-07-19 LAB — SURGICAL PATHOLOGY

## 2022-07-19 NOTE — Patient Instructions (Addendum)
Visit Information  Thank you for taking time to visit with me today. Please don't hesitate to contact me if I can be of assistance to you before our next scheduled telephone appointment.  Following are the goals we discussed today:  Call cancer center to see if genetic counseling is in person or telephonic.  Increase activity as tolerated, using pain medication when needed.  Our next appointment is by telephone on 12/8 at 3pm  Please call the care guide team at [phone] if you need to cancel or reschedule your appointment.   Please call the Suicide and Crisis Lifeline: 988 call the [place] National Suicide Prevention Lifeline: (570)190-8133 or TTY: (351)614-7730 TTY 272-229-7724) to talk to a trained counselor call 1-800-273-TALK (toll free, 24 hour hotline) call 911 if you are experiencing a Mental Health or [place] or need someone to talk to.  Patient verbalizes understanding of instructions and care plan provided today and agrees to view in [place]. Active MyChart status and patient understanding of how to access instructions and care plan via MyChart confirmed with patient.     The patient has been provided with contact information for the care management team and has been advised to call with any health related questions or concerns.   Valente David, RN, MSN, [facility] Care Management Care Management Coordinator [phone]

## 2022-07-19 NOTE — Patient Outreach (Signed)
  Care Coordination   Follow Up Visit Note   07/19/2022 Name: Alexandria [name] MRN: [id number] DOB: 1955/12/02  Alexandria [name] is a 66 y.o. year old female who sees Gwyneth Sprout, FNP for primary care. I spoke with  Alexandria [name] by phone today.  What matters to the patients health and wellness today?  Breast lumpectomy complete, waiting on pathology results for lymph nodes.  Denies any urgent concerns, encouraged to contact this care manager with questions.      Goals Addressed             This Visit's Progress    New diagnosis of breast cancer   On track    Care Coordination Interventions: Assessed patient understanding of cancer diagnosis and recommended treatment plan Reviewed upcoming provider appointments and treatment appointments Assessed available transportation to appointments and treatments. Has consistent/reliable transportation: Yes Assessed support system. Has consistent/reliable family or other support: Yes Reviewed appointment dates: genetic counseling on 11/28, surgical follow up on 11/29, oncology follow up on 12/6 Discussed pain control post op. Has Oxycodone and Ibuprofen.  Advised of side effects of narcotics. Discussed post op plan - will have better idea of plan after follow up appointments.  Currently, planning radiation therapy Advised of contact person for oncology/nurse navigator          SDOH assessments and interventions completed:  No     Care Coordination Interventions Activated:  Yes  Care Coordination Interventions:  Yes, provided   Follow up plan: Follow up call scheduled for 12/8    Encounter Outcome:  Pt. Visit Completed   Valente David, RN, MSN, [place] Care Management Care Management Coordinator [phone]

## 2022-07-22 ENCOUNTER — Ambulatory Visit: Payer: Self-pay

## 2022-07-22 ENCOUNTER — Telehealth: Payer: Medicare Other | Admitting: Physician Assistant

## 2022-07-22 DIAGNOSIS — J208 Acute bronchitis due to other specified organisms: Secondary | ICD-10-CM | POA: Diagnosis not present

## 2022-07-22 DIAGNOSIS — B9689 Other specified bacterial agents as the cause of diseases classified elsewhere: Secondary | ICD-10-CM | POA: Diagnosis not present

## 2022-07-22 MED ORDER — ONDANSETRON 4 MG PO TBDP: 4.0000 mg | ORAL_TABLET | Freq: Three times a day (TID) | ORAL | 0 refills | Status: DC | PRN

## 2022-07-22 MED ORDER — DOXYCYCLINE HYCLATE 100 MG PO TABS: 100.0000 mg | ORAL_TABLET | Freq: Two times a day (BID) | ORAL | 0 refills | Status: DC

## 2022-07-22 MED ORDER — PREDNISONE 20 MG PO TABS: 40.0000 mg | ORAL_TABLET | Freq: Every day | ORAL | 0 refills | Status: DC

## 2022-07-22 MED ORDER — BENZONATATE 100 MG PO CAPS: 100.0000 mg | ORAL_CAPSULE | Freq: Three times a day (TID) | ORAL | 0 refills | Status: DC | PRN

## 2022-07-22 NOTE — Telephone Encounter (Signed)
Summary: congestion, cough  post surgery   Pt had a lumpectomy last week and now has congestion, cough , fever .  She was trying to do a virtual visit today but there is nothing until Wednesday  CB@  [phone]           Chief Complaint: cough productive green thick sputum. S/p lumpectomy Symptoms: SOB with exertion. Productive cough. Temp 98.9 tested negative for covid today .  Frequency: 07/12/22 Pertinent Negatives: Patient denies chest pain no difficulty breathing at rest. No fever Disposition: '[]'$ ED /'[]'$ Urgent Care (no appt availability in office) / '[]'$ Appointment(In office/virtual)/ '[x]'$  [facility]/ '[]'$ Home Care/ '[]'$ Refused Recommended Disposition /'[]'$ [facility]/ '[]'$  Follow-up with PCP Additional Notes:   [facility] appt today        Reason for Disposition  [1] MILD difficulty breathing (e.g., minimal/no SOB at rest, SOB with walking, pulse <100) AND [2] still present when not coughing  Answer Assessment - Initial Assessment Questions 1. ONSET: "When did the cough begin?"      07/12/22 2. SEVERITY: "How bad is the cough today?"      Worsening  3. SPUTUM: "Describe the color of your sputum" (none, dry cough; clear, white, yellow, green)     Thick green  4. HEMOPTYSIS: "Are you coughing up any blood?" If so ask: "How much?" (flecks, streaks, tablespoons, etc.)     no 5. DIFFICULTY BREATHING: "Are you having difficulty breathing?" If Yes, ask: "How bad is it?" (e.g., mild, moderate, severe)    - MILD: No SOB at rest, mild SOB with walking, speaks normally in sentences, can lie down, no retractions, pulse < 100.    - MODERATE: SOB at rest, SOB with minimal exertion and prefers to sit, cannot lie down flat, speaks in phrases, mild retractions, audible wheezing, pulse 100-120.    - SEVERE: Very SOB at rest, speaks in single words, struggling to breathe, sitting hunched forward, retractions, pulse > 120      Moderate  6. FEVER: "Do you have a fever?" If Yes, ask:  "What is your temperature, how was it measured, and when did it start?"     98.9 7. CARDIAC HISTORY: "Do you have any history of heart disease?" (e.g., heart attack, congestive heart failure)      See hx  8. LUNG HISTORY: "Do you have any history of lung disease?"  (e.g., pulmonary embolus, asthma, emphysema)     na 9. PE RISK FACTORS: "Do you have a history of blood clots?" (or: recent major surgery, recent prolonged travel, bedridden)     S/p lumpectomy  10. OTHER SYMPTOMS: "Do you have any other symptoms?" (e.g., runny nose, wheezing, chest pain)       Sob with exertion. Cough mucus green  11. PREGNANCY: "Is there any chance you are pregnant?" "When was your last menstrual period?"       na 12. TRAVEL: "Have you traveled out of the country in the last month?" (e.g., travel history, exposures)       na  Protocols used: Cough - Acute Productive-A-AH

## 2022-07-22 NOTE — Patient Instructions (Signed)
Quenten Raven Raneri, thank you for joining Mar Daring, PA-C for today's virtual visit.  While this provider is not your primary care provider (PCP), if your PCP is located in our provider database this encounter information will be shared with them immediately following your visit.   [facility] account gives you access to today's visit and all your visits, tests, and labs performed at [facility] " click here if you don't have a [facility] account or go to [email or web address]  Consent: (Patient) Alexandria [name] provided verbal consent for this virtual visit at the beginning of the encounter.  Current Medications:  Current Outpatient Medications:    benzonatate (TESSALON) 100 MG capsule, Take 1 capsule (100 mg total) by mouth 3 (three) times daily as needed., Disp: 30 capsule, Rfl: 0   doxycycline (VIBRA-TABS) 100 MG tablet, Take 1 tablet (100 mg total) by mouth 2 (two) times daily., Disp: 20 tablet, Rfl: 0   ondansetron (ZOFRAN-ODT) 4 MG disintegrating tablet, Take 1 tablet (4 mg total) by mouth every 8 (eight) hours as needed for nausea or vomiting., Disp: 20 tablet, Rfl: 0   predniSONE (DELTASONE) 20 MG tablet, Take 2 tablets (40 mg total) by mouth daily with breakfast., Disp: 10 tablet, Rfl: 0   acetaminophen (TYLENOL) 500 MG tablet, Take 1,000 mg by mouth every 6 (six) hours as needed for mild pain., Disp: , Rfl:    acetaminophen (TYLENOL) 500 MG tablet, Take 2 tablets (1,000 mg total) by mouth every 6 (six) hours as needed for mild pain., Disp: , Rfl:    Albuterol Sulfate (PROAIR RESPICLICK) 989 (90 Base) MCG/ACT AEPB, Inhale 2 puffs into the lungs 4 (four) times daily as needed., Disp: 3 each, Rfl: 4   amLODipine (NORVASC) 10 MG tablet, Take 1 tablet (10 mg total) by mouth daily., Disp: 90 tablet, Rfl: 3   cetirizine (ZYRTEC) 10 MG tablet, Take 1 tablet (10 mg total) by mouth daily., Disp: 30 tablet, Rfl: 11   clonazePAM (KLONOPIN) 0.5 MG tablet, TAKE  ONE-HALF TABLET BY MOUTH 3  TIMES DAILY AS NEEDED FOR  ANXIETY, Disp: 90 tablet, Rfl: 0   famotidine (PEPCID) 20 MG tablet, Take 20 mg by mouth as needed for heartburn., Disp: , Rfl:    hydrochlorothiazide (HYDRODIURIL) 25 MG tablet, TAKE 1 TABLET BY MOUTH DAILY, Disp: 60 tablet, Rfl: 5   ibuprofen (ADVIL) 600 MG tablet, Take 1 tablet (600 mg total) by mouth every 8 (eight) hours as needed for moderate pain., Disp: 60 tablet, Rfl: 1   loperamide (IMODIUM) 2 MG capsule, Take 1 capsule (2 mg total) by mouth 4 (four) times daily as needed for diarrhea or loose stools., Disp: 30 capsule, Rfl: 1   metFORMIN (GLUCOPHAGE-XR) 750 MG 24 hr tablet, Take 750 mg by mouth daily with supper., Disp: , Rfl:    MULTIPLE VITAMIN PO, Take 1 tablet by mouth daily. Reported on 03/13/2016, Disp: , Rfl:    oxyCODONE (OXY IR/ROXICODONE) 5 MG immediate release tablet, Take 1 tablet (5 mg total) by mouth every 4 (four) hours as needed for severe pain., Disp: 30 tablet, Rfl: 0   rosuvastatin (CRESTOR) 40 MG tablet, Take 1 tablet (40 mg total) by mouth daily., Disp: 90 tablet, Rfl: 3   TRELEGY ELLIPTA 100-62.5-25 MCG/ACT AEPB, USE 1 INHALATION BY MOUTH DAILY, Disp: 120 each, Rfl: 5   Medications ordered in this encounter:  Meds ordered this encounter  Medications   ondansetron (ZOFRAN-ODT) 4 MG disintegrating tablet  Sig: Take 1 tablet (4 mg total) by mouth every 8 (eight) hours as needed for nausea or vomiting.    Dispense:  20 tablet    Refill:  0    Order Specific Question:   Supervising Provider    Answer:   Chase Picket [[id number]]   benzonatate (TESSALON) 100 MG capsule    Sig: Take 1 capsule (100 mg total) by mouth 3 (three) times daily as needed.    Dispense:  30 capsule    Refill:  0    Order Specific Question:   Supervising Provider    Answer:   Chase Picket [[id number]]   predniSONE (DELTASONE) 20 MG tablet    Sig: Take 2 tablets (40 mg total) by mouth daily with breakfast.    Dispense:  10 tablet     Refill:  0    Order Specific Question:   Supervising Provider    Answer:   Chase Picket [[id number]]   doxycycline (VIBRA-TABS) 100 MG tablet    Sig: Take 1 tablet (100 mg total) by mouth 2 (two) times daily.    Dispense:  20 tablet    Refill:  0    Order Specific Question:   Supervising Provider    Answer:   Chase Picket [id number]     *If you need refills on other medications prior to your next appointment, please contact your pharmacy*  Follow-Up: Call back or seek an in-person evaluation if the symptoms worsen or if the condition fails to improve as anticipated.  [place] [phone]  Other Instructions  Community-Acquired Pneumonia, Adult Pneumonia is a lung infection that causes inflammation and the buildup of mucus and fluids in the lungs. This may cause coughing and difficulty breathing. Community-acquired pneumonia is pneumonia that develops in people who are not, and have not recently been, in a hospital or other health care facility. Usually, pneumonia develops as a result of an illness that is caused by a virus, such as the common cold and the flu (influenza). It can also be caused by bacteria or fungi. While the common cold and influenza can pass from person to person (are contagious), pneumonia itself is not considered contagious. What are the causes? This condition may be caused by: Viruses. Bacteria. Fungi. What increases the risk? The following factors may make you more likely to develop this condition: Being over age 10 or having certain medical conditions, such as: A long-term (chronic) disease, such as: chronic obstructive pulmonary disease (COPD), asthma, heart failure, diabetes, or kidney disease. A condition that increases the risk of breathing in (aspirating) mucus and other fluids from your mouth and nose. A weakened body defense system (immune system). Having had your spleen removed (splenectomy). The spleen is the organ that  helps fight germs and infections. Not cleaning your teeth and gums well (poor dental hygiene). Using tobacco products. Traveling to places where germs that cause pneumonia are present or being near certain animals or animal habitats that could have germs that cause pneumonia. What are the signs or symptoms? Symptoms of this condition include: A dry cough or a wet (productive) cough. A fever, sweating, or chills. Chest pain, especially when breathing deeply or coughing. Fast breathing, difficulty breathing, or shortness of breath. Tiredness (fatigue) and muscle aches. How is this diagnosed? This condition may be diagnosed based on your medical history or a physical exam. You may also have tests, including: Imaging, such as a chest X-ray or lung ultrasound. Tests of:  The level of oxygen and other gases in your blood. Mucus from your lungs (sputum). Fluid around your lungs (pleural fluid). Your urine. How is this treated? Treatment for this condition depends on many factors, such as the cause of your pneumonia, your medicines, and other medical conditions that you have. For most adults, pneumonia may be treated at home. In some cases, treatment must happen in a hospital and may include: Medicines that are given by mouth (orally) or through an IV, including: Antibiotic medicines, if bacteria caused the pneumonia. Medicines that kill viruses (antiviral medicines), if a virus caused the pneumonia. Oxygen therapy. Severe pneumonia, although rare, may require the following treatments: Mechanical ventilation.This procedure uses a machine to help you breathe if you cannot breathe well on your own or maintain a safe level of blood oxygen. Thoracentesis. This procedure removes any buildup of pleural fluid to help with breathing. Follow these instructions at home:  Medicines Take over-the-counter and prescription medicines only as told by your health care provider. Take cough medicine only if you  have trouble sleeping. Cough medicine can prevent your body from removing mucus from your lungs. If you were prescribed antibiotics, take them as told by your health care provider. Do not stop taking the antibiotic even if you start to feel better. Lifestyle     Do not drink alcohol. Do not use any products that contain nicotine or tobacco. These products include cigarettes, chewing tobacco, and vaping devices, such as e-cigarettes. If you need help quitting, ask your health care provider. Eat a healthy diet. This includes plenty of vegetables, fruits, whole grains, low-fat dairy products, and lean protein. General instructions Rest a lot and get at least 8 hours of sleep each night. Sleep in a partly upright position at night. Place a few pillows under your head or sleep in a reclining chair. Return to your normal activities as told by your health care provider. Ask your health care provider what activities are safe for you. Drink enough fluid to keep your urine pale yellow. This helps to thin the mucus in your lungs. If your throat is sore, gargle with a mixture of salt and water 3-4 times a day or as needed. To make salt water, completely dissolve -1 tsp (3-6 g) of salt in 1 cup (237 mL) of warm water. Keep all follow-up visits. How is this prevented? You can lower your risk of developing community-acquired pneumonia by: Getting the pneumonia vaccine. There are different types and schedules of pneumonia vaccines. Ask your health care provider which option is best for you. Consider getting the pneumonia vaccine if: You are older than 67 years of age. You are 65-46 years of age and are receiving cancer treatment, have chronic lung disease, or have other medical conditions that affect your immune system. Ask your health care provider if this applies to you. Getting your influenza vaccine every year. Ask your health care provider which type of vaccine is best for you. Getting regular dental  checkups. Washing your hands often with soap and water for at least 20 seconds. If soap and water are not available, use hand sanitizer. Contact a health care provider if: You have a fever. You have trouble sleeping because you cannot control your cough with cough medicine. Get help right away if: Your shortness of breath becomes worse. Your chest pain increases. Your sickness becomes worse, especially if you are an older adult or have a weak immune system. You cough up blood. These symptoms may be an  emergency. Get help right away. Call 911. Do not wait to see if the symptoms will go away. Do not drive yourself to the hospital. Summary Pneumonia is an infection of the lungs. Community-acquired pneumonia develops in people who have not been in the hospital. It can be caused by bacteria, viruses, or fungi. This condition may be treated with antibiotics or antiviral medicines. Severe pneumonia may require a hospital stay and treatment to help with breathing. This information is not intended to replace advice given to you by your health care provider. Make sure you discuss any questions you have with your health care provider. Document Revised: 10/17/2021 Document Reviewed: 10/17/2021 Elsevier Patient Education  [employer].    If you have been instructed to have an in-person evaluation today at a local Urgent Care facility, please use the link below. It will take you to a list of all of our available [facility], including address, phone number and hours of operation. Please do not delay care.  [facility]  If you or a family member do not have a primary care provider, use the link below to schedule a visit and establish care. When you choose a [facility] primary care physician or advanced practice provider, you gain a long-term partner in health. Find a Primary Care Provider  Learn more about [facility]'s in-office and virtual care options: [facility]

## 2022-07-22 NOTE — Progress Notes (Signed)
07/22/22: Spoke with patient's daughter about pathology results.  All lymph nodes negative for cancer and breast margins are clear.  She reports that her mother has been having coughing and congestion, mild fever at home.  She has an appointment with her PCP today.  Otherwise denies any issues with the incisions themselves.  Follow up with me is scheduled for 11/29 and with Dr. Darrall Dears on 12/6.  Olean Ree, MD

## 2022-07-22 NOTE — Telephone Encounter (Signed)
Called pt's daughter and pt, LVMTCB to discuss sx with NT.   Summary: congestion, cough  post surgery   Pt had a lumpectomy last week and now has congestion, cough , fever .  She was trying to do a virtual visit today but there is nothing until Wednesday  CB@  [phone]

## 2022-07-22 NOTE — Progress Notes (Signed)
Virtual Visit Consent   [name], you are scheduled for a virtual visit with a [facility] provider today. Just as with appointments in the office, your consent must be obtained to participate. Your consent will be active for this visit and any virtual visit you may have with one of our providers in the next 365 days. If you have a MyChart account, a copy of this consent can be sent to you electronically.  As this is a virtual visit, video technology does not allow for your provider to perform a traditional examination. This may limit your provider's ability to fully assess your condition. If your provider identifies any concerns that need to be evaluated in person or the need to arrange testing (such as labs, EKG, etc.), we will make arrangements to do so. Although advances in technology are sophisticated, we cannot ensure that it will always work on either your end or our end. If the connection with a video visit is poor, the visit may have to be switched to a telephone visit. With either a video or telephone visit, we are not always able to ensure that we have a secure connection.  By engaging in this virtual visit, you consent to the provision of healthcare and authorize for your insurance to be billed (if applicable) for the services provided during this visit. Depending on your insurance coverage, you may receive a charge related to this service.  I need to obtain your verbal consent now. Are you willing to proceed with your visit today? Alexandria [name] has provided verbal consent on 07/22/2022 for a virtual visit (video or telephone). Mar Daring, PA-C  Date: 07/22/2022 12:42 PM  Virtual Visit via Video Note   I, Mar Daring, connected with  Alexandria [name]  ([id number], [birth year]) on 07/22/22 at 12:30 PM EST by a video-enabled telemedicine application and verified that I am speaking with the correct person using two identifiers.  Location: Patient: Virtual Visit Location Patient:  Home Provider: Virtual Visit Location Provider: Home Office   I discussed the limitations of evaluation and management by telemedicine and the availability of in person appointments. The patient expressed understanding and agreed to proceed.    History of Present Illness: Alexandria [name] is a 66 y.o. who identifies as a female who was assigned female at birth, and is being seen today for cough.  HPI: Cough This is a new problem. The current episode started in the past 7 days (Started over the weekend; was hopsitalized on 07/17/22 for a lumpectomy for breast cancer). The problem has been gradually worsening. The problem occurs every few minutes. The cough is Productive of purulent sputum and productive of sputum. Associated symptoms include chest pain, chills, a fever (100.5), headaches, myalgias, nasal congestion, postnasal drip, rhinorrhea, a sore throat (mild, used vapocool sore throat that helped and resolved), shortness of breath, sweats and wheezing. Pertinent negatives include no weight loss. Associated symptoms comments: Diarrhea, nausea, vomiting. The symptoms are aggravated by lying down and cold air. Treatments tried: vapocool drops, ibuprofen, tylenol, inhalers, robitussin. The treatment provided no relief. Her past medical history is significant for bronchitis, COPD and pneumonia.   At home covid testing is negative per patient  Problems:  Patient Active Problem List   Diagnosis Date Noted   Breast cancer, left ([place]) 07/01/2022   Annual physical exam 04/11/2022   Hypertension associated with diabetes ([place]) 04/11/2022   Hyperlipidemia associated with type 2 diabetes mellitus ([place]) 04/11/2022   Need for  pneumococcal 20-valent conjugate vaccination 04/11/2022   Need for influenza vaccination 07/17/2021   Encounter for smoking cessation counseling 07/17/2021   Personal history of colonic polyps    Chronic obstructive pulmonary disease ([place]) 05/19/2018   Hx of colonic polyps      Allergies:  Allergies  Allergen Reactions   Pravastatin Sodium     Myalgia   Medications:  Current Outpatient Medications:    benzonatate (TESSALON) 100 MG capsule, Take 1 capsule (100 mg total) by mouth 3 (three) times daily as needed., Disp: 30 capsule, Rfl: 0   doxycycline (VIBRA-TABS) 100 MG tablet, Take 1 tablet (100 mg total) by mouth 2 (two) times daily., Disp: 20 tablet, Rfl: 0   ondansetron (ZOFRAN-ODT) 4 MG disintegrating tablet, Take 1 tablet (4 mg total) by mouth every 8 (eight) hours as needed for nausea or vomiting., Disp: 20 tablet, Rfl: 0   predniSONE (DELTASONE) 20 MG tablet, Take 2 tablets (40 mg total) by mouth daily with breakfast., Disp: 10 tablet, Rfl: 0   acetaminophen (TYLENOL) 500 MG tablet, Take 1,000 mg by mouth every 6 (six) hours as needed for mild pain., Disp: , Rfl:    acetaminophen (TYLENOL) 500 MG tablet, Take 2 tablets (1,000 mg total) by mouth every 6 (six) hours as needed for mild pain., Disp: , Rfl:    Albuterol Sulfate (PROAIR RESPICLICK) 631 (90 Base) MCG/ACT AEPB, Inhale 2 puffs into the lungs 4 (four) times daily as needed., Disp: 3 each, Rfl: 4   amLODipine (NORVASC) 10 MG tablet, Take 1 tablet (10 mg total) by mouth daily., Disp: 90 tablet, Rfl: 3   cetirizine (ZYRTEC) 10 MG tablet, Take 1 tablet (10 mg total) by mouth daily., Disp: 30 tablet, Rfl: 11   clonazePAM (KLONOPIN) 0.5 MG tablet, TAKE ONE-HALF TABLET BY MOUTH 3  TIMES DAILY AS NEEDED FOR  ANXIETY, Disp: 90 tablet, Rfl: 0   famotidine (PEPCID) 20 MG tablet, Take 20 mg by mouth as needed for heartburn., Disp: , Rfl:    hydrochlorothiazide (HYDRODIURIL) 25 MG tablet, TAKE 1 TABLET BY MOUTH DAILY, Disp: 60 tablet, Rfl: 5   ibuprofen (ADVIL) 600 MG tablet, Take 1 tablet (600 mg total) by mouth every 8 (eight) hours as needed for moderate pain., Disp: 60 tablet, Rfl: 1   loperamide (IMODIUM) 2 MG capsule, Take 1 capsule (2 mg total) by mouth 4 (four) times daily as needed for diarrhea or loose  stools., Disp: 30 capsule, Rfl: 1   metFORMIN (GLUCOPHAGE-XR) 750 MG 24 hr tablet, Take 750 mg by mouth daily with supper., Disp: , Rfl:    MULTIPLE VITAMIN PO, Take 1 tablet by mouth daily. Reported on 03/13/2016, Disp: , Rfl:    oxyCODONE (OXY IR/ROXICODONE) 5 MG immediate release tablet, Take 1 tablet (5 mg total) by mouth every 4 (four) hours as needed for severe pain., Disp: 30 tablet, Rfl: 0   rosuvastatin (CRESTOR) 40 MG tablet, Take 1 tablet (40 mg total) by mouth daily., Disp: 90 tablet, Rfl: 3   TRELEGY ELLIPTA 100-62.5-25 MCG/ACT AEPB, USE 1 INHALATION BY MOUTH DAILY, Disp: 120 each, Rfl: 5  Observations/Objective: Patient is well-developed, well-nourished in no acute distress.  Resting comfortably at home.  Head is normocephalic, atraumatic.  Mildly labored breathing noted, but not affecting speech.  Speech is clear and coherent with logical content.  Patient is alert and oriented at baseline.   Assessment and Plan: 1. Acute bacterial bronchitis - ondansetron (ZOFRAN-ODT) 4 MG disintegrating tablet; Take 1 tablet (4 mg total)  by mouth every 8 (eight) hours as needed for nausea or vomiting.  Dispense: 20 tablet; Refill: 0 - benzonatate (TESSALON) 100 MG capsule; Take 1 capsule (100 mg total) by mouth 3 (three) times daily as needed.  Dispense: 30 capsule; Refill: 0 - predniSONE (DELTASONE) 20 MG tablet; Take 2 tablets (40 mg total) by mouth daily with breakfast.  Dispense: 10 tablet; Refill: 0 - doxycycline (VIBRA-TABS) 100 MG tablet; Take 1 tablet (100 mg total) by mouth 2 (two) times daily.  Dispense: 20 tablet; Refill: 0  - Worsening symptoms that have not responded to OTC medications following recent hospitalization and surgical procedure (lumpectomy of left breast for breast cancer) - Will give Doxycycline - Zofran for nausea - Tessalon perles and prednisone for cough - Can add Mucinex - Steam and humidifier can help - Stay well hydrated and get plenty of rest.  - Seek in  person evaluation if no symptom improvement or if symptoms worsen   Follow Up Instructions: I discussed the assessment and treatment plan with the patient. The patient was provided an opportunity to ask questions and all were answered. The patient agreed with the plan and demonstrated an understanding of the instructions.  A copy of instructions were sent to the patient via MyChart unless otherwise noted below.    The patient was advised to call back or seek an in-person evaluation if the symptoms worsen or if the condition fails to improve as anticipated.  Time:  I spent 16 minutes with the patient via telehealth technology discussing the above problems/concerns.    Mar Daring, PA-C

## 2022-07-23 NOTE — Telephone Encounter (Signed)
Spoke with patient. She had a virtual visit with another office yesterday.

## 2022-07-24 ENCOUNTER — Ambulatory Visit (INDEPENDENT_AMBULATORY_CARE_PROVIDER_SITE_OTHER): Payer: Medicare Other

## 2022-07-24 ENCOUNTER — Other Ambulatory Visit: Payer: Self-pay | Admitting: Family Medicine

## 2022-07-24 ENCOUNTER — Ambulatory Visit
Admission: EM | Admit: 2022-07-24 | Discharge: 2022-07-24 | Disposition: A | Discharge status: Home / Self Care | Payer: Medicare Other | Attending: Emergency Medicine | Admitting: Emergency Medicine

## 2022-07-24 DIAGNOSIS — F419 Anxiety disorder, unspecified: Secondary | ICD-10-CM

## 2022-07-24 DIAGNOSIS — R059 Cough, unspecified: Secondary | ICD-10-CM

## 2022-07-24 DIAGNOSIS — Z20822 Contact with and (suspected) exposure to covid-19: Secondary | ICD-10-CM

## 2022-07-24 DIAGNOSIS — J441 Chronic obstructive pulmonary disease with (acute) exacerbation: Secondary | ICD-10-CM

## 2022-07-24 DIAGNOSIS — J22 Unspecified acute lower respiratory infection: Secondary | ICD-10-CM | POA: Diagnosis present

## 2022-07-24 DIAGNOSIS — R0602 Shortness of breath: Secondary | ICD-10-CM | POA: Diagnosis not present

## 2022-07-24 DIAGNOSIS — R051 Acute cough: Secondary | ICD-10-CM | POA: Insufficient documentation

## 2022-07-24 DIAGNOSIS — R197 Diarrhea, unspecified: Secondary | ICD-10-CM

## 2022-07-24 LAB — RESP PANEL BY RT-PCR (RSV, FLU A&B, COVID)  RVPGX2
Influenza A by PCR: NEGATIVE
Influenza B by PCR: NEGATIVE
Resp Syncytial Virus by PCR: NEGATIVE
SARS Coronavirus 2 by RT PCR: NEGATIVE

## 2022-07-24 MED ORDER — PREDNISONE 20 MG PO TABS
20.0000 mg | ORAL_TABLET | Freq: Every day | ORAL | Status: DC
  Administered 2022-07-24: 20 mg via ORAL

## 2022-07-24 MED ORDER — AEROCHAMBER MV MISC: 1 refills | Status: AC

## 2022-07-24 MED ORDER — ALBUTEROL SULFATE (2.5 MG/3ML) 0.083% IN NEBU
2.5000 mg | INHALATION_SOLUTION | Freq: Once | RESPIRATORY_TRACT | Status: AC
  Administered 2022-07-24: 2.5 mg via RESPIRATORY_TRACT

## 2022-07-24 MED ORDER — ALBUTEROL SULFATE HFA 108 (90 BASE) MCG/ACT IN AERS: 1.0000 | INHALATION_SPRAY | RESPIRATORY_TRACT | 0 refills | Status: DC | PRN

## 2022-07-24 MED ORDER — IPRATROPIUM-ALBUTEROL 0.5-2.5 (3) MG/3ML IN SOLN
3.0000 mL | Freq: Once | RESPIRATORY_TRACT | Status: AC
  Administered 2022-07-24: 3 mL via RESPIRATORY_TRACT

## 2022-07-24 MED ORDER — AMOXICILLIN-POT CLAVULANATE 875-125 MG PO TABS: 1.0000 | ORAL_TABLET | Freq: Two times a day (BID) | ORAL | 0 refills | Status: DC

## 2022-07-24 NOTE — ED Triage Notes (Signed)
Patient presents to UC for cough and SOB since Saturday. States she has attempted to use her inhaler had difficulty inhaling the medication . Last known fever Monday. She had a video vist with PCP and they prescribed her antibiotics for possible pneumonia. Hx of asthma and COPD. States baseline 02 she believes is 98%, Does not use oxygen at home.   Chest pain.

## 2022-07-24 NOTE — ED Triage Notes (Signed)
Pt present cough with congestion and SOB. Symptoms started four days ago. Pt tried OTC medication with no relief.

## 2022-07-24 NOTE — Discharge Instructions (Signed)
2 puffs from your albuterol inhaler using your spacer every 4 hours for 2 days, then every 6 hours for 2 days, then as needed.  You can back off on the albuterol if you start to improve sooner.  Finish the prednisone.  Start the Augmentin with the doxycycline and finish them both.  I want to cover a pneumonia.  Get up and move around on a regular basis to prevent a blood clot in your leg or lungs.  Go to the ER if your oxygen saturation is consistently below 90%, if you still feel short of breath despite the albuterol and medications, or for other concerns

## 2022-07-24 NOTE — ED Provider Notes (Signed)
HPI  SUBJECTIVE:  Alexandria [name] is a 66 y.o. female who presents with 5 days of sore throat, fevers Tmax 100.5, headache, nasal congestion, rhinorrhea, sinus pain and pressure, productive cough that has now become dry, shortness of breath, dyspnea on exertion, wheezing.  She reports coughing with deep inspiration.  No chest pain or pleuritic pain.  No calf pain, swelling, hemoptysis, exogenous estrogen.  No known COVID or flu exposure.  She did not get the COVID-vaccine.  She got the flu and RSV vaccine.  She had an e-visit 2 days ago, was prescribed Tessalon, prednisone 40 mg for 5 days, doxycycline for 10 days and Zofran.  She states that the doxycycline and prednisone are helping.  She has been using her albuterol inhaler twice a day with some improvement in her symptoms.  Symptoms worse with exertion.  She has a past medical history of COPD/asthma.  Has never been admitted to the hospital for COPD/asthma exacerbation.  She continues to smoke.  She has a history of prediabetes, hypertension, pneumonia, MRSA, and currently has breast cancer, status post lumpectomy with general anesthesia 1 week ago.  PCP: [facility].   Past Medical History:  Diagnosis Date   Anxiety    Cancer ([facility])    Carbuncle and furuncle of trunk    COPD (chronic obstructive pulmonary disease) ([facility])    Depression    Diabetes mellitus without complication ([facility])    Dyspnea    GERD (gastroesophageal reflux disease)    HTN (hypertension)    Hyperlipidemia    Intrinsic asthma, unspecified    Methicillin resistant Staphylococcus aureus in conditions classified elsewhere and of unspecified site    Other psychological or physical stress, not elsewhere classified(V62.89)    Pneumonia    Pre-diabetes    Tobacco abuse     Past Surgical History:  Procedure Laterality Date   BREAST BIOPSY Left 06/26/2022   stereo biopsy/ coil clip/ path pending   BREAST [place] Left 07/17/2022   Procedure: BREAST LUMPECTOMY,RADIO FREQ LOCALIZER,AXILLARY SENTINEL LYMPH NODE BIOPSY;  Surgeon: Olean Ree, MD;  Location: [facility];  Service: General;  Laterality: Left;   COLONOSCOPY WITH PROPOFOL N/A 04/10/2015   Procedure: COLONOSCOPY WITH PROPOFOL;  Surgeon: Lucilla Lame, MD;  Location: [place];  Service: Endoscopy;  Laterality: N/A;   COLONOSCOPY WITH PROPOFOL N/A 06/25/2021   Procedure: COLONOSCOPY WITH PROPOFOL;  Surgeon: Lucilla Lame, MD;  Location: [place];  Service: Endoscopy;  Laterality: N/A;  leave at 8 arrival   ESOPHAGOGASTRODUODENOSCOPY (EGD) WITH PROPOFOL N/A 12/06/2021   Procedure: ESOPHAGOGASTRODUODENOSCOPY (EGD) WITH PROPOFOL;  Surgeon: Lucilla Lame, MD;  Location: [place];  Service: Endoscopy;  Laterality: N/A;   POLYPECTOMY  04/10/2015   Procedure: POLYPECTOMY;  Surgeon: Lucilla Lame, MD;  Location: [place];  Service: Endoscopy;;   POLYPECTOMY N/A 06/25/2021   Procedure: POLYPECTOMY;  Surgeon: Lucilla Lame, MD;  Location: [place];  Service: Endoscopy;  Laterality: N/A;   STATUS POST ENDOMETRIAL ABLATION     TONSILLECTOMY     TUBAL LIGATION     bilateral, sterile ablation   VARICOSE VEINS      Family History  Problem Relation Age of Onset   Coronary artery disease Mother    Hypertension Mother    Rheum arthritis Mother    Heart attack Mother    Lung cancer Father    Emphysema Father    Gout Father    Diabetes Sister  Hypertension Daughter    Hypertension Daughter    Obesity Daughter    Seizures Daughter    Breast cancer Neg Hx    Bladder Cancer Neg Hx    Kidney cancer Neg Hx     Social History   Tobacco Use   Smoking status: Every Day    Packs/day: 0.50    Years: 30.00    Total pack years: 15.00    Types: Cigarettes    Last attempt to quit: 01/31/2017    Years since quitting: 5.4   Smokeless tobacco: Never  Vaping Use   Vaping Use: Never used  Substance Use  Topics   Alcohol use: No    Alcohol/week: 0.0 standard drinks of alcohol   Drug use: No     Current Facility-Administered Medications:    predniSONE (DELTASONE) tablet 20 mg, 20 mg, Oral, Q breakfast, Melynda Ripple, MD, 20 mg at 07/24/22 0626  Current Outpatient Medications:    albuterol (VENTOLIN HFA) 108 (90 Base) MCG/ACT inhaler, Inhale 1-2 puffs into the lungs every 4 (four) hours as needed for wheezing or shortness of breath., Disp: 1 each, Rfl: 0   amoxicillin-clavulanate (AUGMENTIN) 875-125 MG tablet, Take 1 tablet by mouth every 12 (twelve) hours., Disp: 14 tablet, Rfl: 0   Spacer/Aero-Holding Chambers (AEROCHAMBER MV) inhaler, Use as instructed, Disp: 1 each, Rfl: 1   acetaminophen (TYLENOL) 500 MG tablet, Take 1,000 mg by mouth every 6 (six) hours as needed for mild pain., Disp: , Rfl:    acetaminophen (TYLENOL) 500 MG tablet, Take 2 tablets (1,000 mg total) by mouth every 6 (six) hours as needed for mild pain., Disp: , Rfl:    Albuterol Sulfate (PROAIR RESPICLICK) 948 (90 Base) MCG/ACT AEPB, Inhale 2 puffs into the lungs 4 (four) times daily as needed., Disp: 3 each, Rfl: 4   amLODipine (NORVASC) 10 MG tablet, Take 1 tablet (10 mg total) by mouth daily., Disp: 90 tablet, Rfl: 3   benzonatate (TESSALON) 100 MG capsule, Take 1 capsule (100 mg total) by mouth 3 (three) times daily as needed., Disp: 30 capsule, Rfl: 0   cetirizine (ZYRTEC) 10 MG tablet, Take 1 tablet (10 mg total) by mouth daily., Disp: 30 tablet, Rfl: 11   clonazePAM (KLONOPIN) 0.5 MG tablet, TAKE ONE-HALF TABLET BY MOUTH 3  TIMES DAILY AS NEEDED FOR  ANXIETY, Disp: 90 tablet, Rfl: 0   doxycycline (VIBRA-TABS) 100 MG tablet, Take 1 tablet (100 mg total) by mouth 2 (two) times daily., Disp: 20 tablet, Rfl: 0   famotidine (PEPCID) 20 MG tablet, Take 20 mg by mouth as needed for heartburn., Disp: , Rfl:    hydrochlorothiazide (HYDRODIURIL) 25 MG tablet, TAKE 1 TABLET BY MOUTH DAILY, Disp: 60 tablet, Rfl: 5    ibuprofen (ADVIL) 600 MG tablet, Take 1 tablet (600 mg total) by mouth every 8 (eight) hours as needed for moderate pain., Disp: 60 tablet, Rfl: 1   loperamide (IMODIUM) 2 MG capsule, Take 1 capsule (2 mg total) by mouth 4 (four) times daily as needed for diarrhea or loose stools., Disp: 30 capsule, Rfl: 1   metFORMIN (GLUCOPHAGE-XR) 750 MG 24 hr tablet, Take 750 mg by mouth daily with supper., Disp: , Rfl:    MULTIPLE VITAMIN PO, Take 1 tablet by mouth daily. Reported on 03/13/2016, Disp: , Rfl:    ondansetron (ZOFRAN-ODT) 4 MG disintegrating tablet, Take 1 tablet (4 mg total) by mouth every 8 (eight) hours as needed for nausea or vomiting., Disp: 20 tablet, Rfl: 0  oxyCODONE (OXY IR/ROXICODONE) 5 MG immediate release tablet, Take 1 tablet (5 mg total) by mouth every 4 (four) hours as needed for severe pain., Disp: 30 tablet, Rfl: 0   predniSONE (DELTASONE) 20 MG tablet, Take 2 tablets (40 mg total) by mouth daily with breakfast., Disp: 10 tablet, Rfl: 0   rosuvastatin (CRESTOR) 40 MG tablet, Take 1 tablet (40 mg total) by mouth daily., Disp: 90 tablet, Rfl: 3   TRELEGY ELLIPTA 100-62.5-25 MCG/ACT AEPB, USE 1 INHALATION BY MOUTH DAILY, Disp: 120 each, Rfl: 5  Allergies  Allergen Reactions   Pravastatin Sodium     Myalgia     ROS  As noted in HPI.   Physical Exam  BP (!) 140/71 (BP Location: Left Arm)   Pulse 90   Temp 98.2 F (36.8 C) (Oral)   Resp 20   SpO2 (S) 91%   Constitutional: Well developed, well nourished, speaking in short sentences coughing Eyes:  EOMI, conjunctiva normal bilaterally HENT: Normocephalic, atraumatic,mucus membranes moist.  Positive nasal congestion.  No maxillary, frontal sinus tenderness Respiratory: Normal inspiratory effort, diffuse expiratory wheezing, poor air movement.  No anterior, lateral chest wall tenderness Cardiovascular: Normal rate, regular rhythm, no murmurs, rubs, gallops GI: nondistended skin: No rash, skin intact Musculoskeletal:  Calves symmetric, nontender, no edema Neurologic: Alert & oriented x 3, no focal neuro deficits Psychiatric: Speech and behavior appropriate   ED Course   Medications  predniSONE (DELTASONE) tablet 20 mg (20 mg Oral Given 07/24/22 0858)  albuterol (PROVENTIL) (2.5 MG/3ML) 0.083% nebulizer solution 2.5 mg (2.5 mg Nebulization Given 07/24/22 0858)  ipratropium-albuterol (DUONEB) 0.5-2.5 (3) MG/3ML nebulizer solution 3 mL (3 mLs Nebulization Given 07/24/22 0858)    Orders Placed This Encounter  Procedures   Resp panel by RT-PCR (RSV, Flu A&B, Covid) Anterior Nasal Swab    Standing Status:   Standing    Number of Occurrences:   1    Order Specific Question:   Patient immune status    Answer:   Normal   DG Chest 2 View    Standing Status:   Standing    Number of Occurrences:   1    Order Specific Question:   Reason for Exam (SYMPTOM  OR DIAGNOSIS REQUIRED)    Answer:   cough SOB hypoxia 81-89% r/o PNA   Recheck vitals    15-30 min after prednisone and duoneb    Standing Status:   Standing    Number of Occurrences:   1    Results for orders placed or performed during the hospital encounter of 07/24/22 (from the past 24 hour(s))  Resp panel by RT-PCR (RSV, Flu A&B, Covid) Anterior Nasal Swab     Status: None   Collection Time: 07/24/22  9:06 AM   Specimen: Anterior Nasal Swab  Result Value Ref Range   SARS Coronavirus 2 by RT PCR NEGATIVE NEGATIVE   Influenza A by PCR NEGATIVE NEGATIVE   Influenza B by PCR NEGATIVE NEGATIVE   Resp Syncytial Virus by PCR NEGATIVE NEGATIVE   DG Chest 2 View  Result Date: 07/24/2022 CLINICAL DATA:  Cough, shortness of breath, and hypoxia. Status post left breast lumpectomy and resection of lymph node within the left upper outer breast adjacent axillary region 1 week ago for breast cancer. EXAM: CHEST - 2 VIEW COMPARISON:  Chest two views 12/11/2019 FINDINGS: Cardiac silhouette and mediastinal contours are within normal limits. Mild calcification  within aortic arch. Flattening of the diaphragms and mild-to-moderate hyperinflation, unchanged. Increased lucencies within  the bilateral lungs suggesting emphysematous changes. No acute airspace opacity. No pleural effusion or pneumothorax. Mild-to-moderate multilevel degenerative disc changes of the thoracic spine. IMPRESSION: 1. No acute cardiopulmonary process. 2. Mild-to-moderate hyperinflation and findings compatible with emphysematous changes, unchanged from prior. Electronically Signed   By: Yvonne Kendall M.D.   On: 07/24/2022 09:49    ED Clinical Impression  1. Acute cough   2. COPD exacerbation ([place])   3. Encounter for laboratory testing for COVID-19 virus   4. Lower respiratory tract infection      ED Assessment/Plan     Patient was seen immediately as soon as I was notified of hypoxia.  Initial O2 sat 81%.  It came up to 89 to 90% without intervention on room air.  Patient is speaking in short sentences, but is in no respiratory distress.  She does not appear to be in impending respiratory failure.  Will give albuterol 5 mg, Atrovent 0.5 mg, 20 mg of prednisone since she took 40 mg earlier this morning, and reevaluate.  Checking COVID, flu, RSV and chest x-ray.  PE in the differential given the recent surgery and hypoxia, however, she is not tachycardic, has no pleuritic pain, and has other respiratory symptoms with sore throat, nasal congestion, rhinorrhea, headache.  She has diffuse wheezing throughout all lung fields.  She has no evidence of a DVT.  I am most concerned about a pneumonia.  Reviewed imaging independently.  No acute airspace opacity, pleural effusion, pneumothorax.  Mild to moderate hyperinflation unchanged from previous chest x-rays.  See radiology report for full details.  COVID, flu, RSV negative.  X-ray negative for pneumonia.  On reevaluation, patient states that she feels slightly better.  She has improved air movement, continued faint expiratory wheezing.   O2 sat on room air 93%.  I believe that she is stable to be treated as an outpatient. Presentation most concerning for a COPD exacerbation, and she may have an underlying pneumonia that did not show up on x-ray.  I am going to add Augmentin to the doxycycline, she is to continue the steroids, regular scheduled albuterol inhaler with a spacer for 4 days, then as needed. Advised patient to get up and move around on a regular basis to prevent PE.  Strict ER return precautions given.  Discussed labs, imaging, MDM, treatment plan, and plan for follow-up with patient and family member.  Discussed sn/sx that should prompt return to the ED. they agree with plan.   Meds ordered this encounter  Medications   albuterol (PROVENTIL) (2.5 MG/3ML) 0.083% nebulizer solution 2.5 mg   ipratropium-albuterol (DUONEB) 0.5-2.5 (3) MG/3ML nebulizer solution 3 mL   predniSONE (DELTASONE) tablet 20 mg   amoxicillin-clavulanate (AUGMENTIN) 875-125 MG tablet    Sig: Take 1 tablet by mouth every 12 (twelve) hours.    Dispense:  14 tablet    Refill:  0   albuterol (VENTOLIN HFA) 108 (90 Base) MCG/ACT inhaler    Sig: Inhale 1-2 puffs into the lungs every 4 (four) hours as needed for wheezing or shortness of breath.    Dispense:  1 each    Refill:  0   Spacer/Aero-Holding Chambers (AEROCHAMBER MV) inhaler    Sig: Use as instructed    Dispense:  1 each    Refill:  1      *This clinic note was created using Dragon dictation software. Therefore, there may be occasional mistakes despite careful proofreading.  ?    Melynda Ripple, MD 07/27/22 1630

## 2022-07-24 NOTE — Telephone Encounter (Signed)
Requested medication (s) are due for refill today: yes  Requested medication (s) are on the active medication list: yes  Last refill:  imodium- 07/08/22 #30 1 refill, klonopin- 05/30/22 #90 0 refills  Future visit scheduled: no  Notes to clinic:  how many refills for imodium? Klonopin - not delegated per protocol. Do you want to refill?     Requested Prescriptions  Pending Prescriptions Disp Refills   loperamide (IMODIUM) 2 MG capsule [Pharmacy Med Name: LOPERAMIDE  '2MG'$   CAP] 60 capsule     Sig: TAKE 1 CAPSULE BY MOUTH 4 TIMES  DAILY AS NEEDED FOR DIARRHEA OR  LOOSE STOOLS     Over the Counter:  OTC Passed - 07/24/2022  4:08 PM      Passed - Valid encounter within last 12 months    Recent Outpatient Visits           3 months ago Annual physical exam   [facility] Tally Joe T, FNP   11 months ago Change in stool   [facility] Gwyneth Sprout, FNP   1 year ago [place] Gwyneth Sprout, FNP   1 year ago [facility] Gwyneth Sprout, FNP   1 year ago Annual physical exam   [place], Vickki Muff, PA-C               clonazePAM (KLONOPIN) 0.5 MG tablet [Pharmacy Med Name: clonazePAM 0.5 MG Oral Tablet] 90 tablet     Sig: [place] 3  TIMES DAILY AS NEEDED FOR  ANXIETY     Not Delegated - Psychiatry: Anxiolytics/Hypnotics 2 Failed - 07/24/2022  4:08 PM      Failed - This refill cannot be delegated      Failed - Urine Drug Screen completed in last 360 days      Passed - Patient is not pregnant      Passed - Valid encounter within last 6 months    Recent Outpatient Visits           3 months ago Annual physical exam   [facility] Tally Joe T, FNP   11 months ago Change in stool   [facility] Gwyneth Sprout, FNP   1 year ago [place] Gwyneth Sprout, FNP   1 year ago [facility] Gwyneth Sprout, FNP   1 year ago Annual physical exam   [facility] [place], Vickki Muff, [place]

## 2022-07-28 ENCOUNTER — Emergency Department: Payer: Medicare Other

## 2022-07-28 ENCOUNTER — Other Ambulatory Visit: Payer: Self-pay

## 2022-07-28 ENCOUNTER — Inpatient Hospital Stay
Admission: EM | Admit: 2022-07-28 | Discharge: 2022-08-01 | DRG: 190 | Disposition: A | Discharge status: Home / Self Care | Payer: Medicare Other | Attending: Internal Medicine | Admitting: Internal Medicine

## 2022-07-28 DIAGNOSIS — E1165 Type 2 diabetes mellitus with hyperglycemia: Secondary | ICD-10-CM | POA: Diagnosis present

## 2022-07-28 DIAGNOSIS — C50912 Malignant neoplasm of unspecified site of left female breast: Secondary | ICD-10-CM | POA: Diagnosis present

## 2022-07-28 DIAGNOSIS — Z8249 Family history of ischemic heart disease and other diseases of the circulatory system: Secondary | ICD-10-CM | POA: Diagnosis not present

## 2022-07-28 DIAGNOSIS — Z7951 Long term (current) use of inhaled steroids: Secondary | ICD-10-CM | POA: Diagnosis not present

## 2022-07-28 DIAGNOSIS — Z79899 Other long term (current) drug therapy: Secondary | ICD-10-CM

## 2022-07-28 DIAGNOSIS — T502X5A Adverse effect of carbonic-anhydrase inhibitors, benzothiadiazides and other diuretics, initial encounter: Secondary | ICD-10-CM | POA: Diagnosis present

## 2022-07-28 DIAGNOSIS — F32A Depression, unspecified: Secondary | ICD-10-CM | POA: Diagnosis present

## 2022-07-28 DIAGNOSIS — J9601 Acute respiratory failure with hypoxia: Secondary | ICD-10-CM | POA: Diagnosis present

## 2022-07-28 DIAGNOSIS — Z833 Family history of diabetes mellitus: Secondary | ICD-10-CM | POA: Diagnosis not present

## 2022-07-28 DIAGNOSIS — F17210 Nicotine dependence, cigarettes, uncomplicated: Secondary | ICD-10-CM | POA: Diagnosis present

## 2022-07-28 DIAGNOSIS — J441 Chronic obstructive pulmonary disease with (acute) exacerbation: Principal | ICD-10-CM | POA: Diagnosis present

## 2022-07-28 DIAGNOSIS — Z825 Family history of asthma and other chronic lower respiratory diseases: Secondary | ICD-10-CM | POA: Diagnosis not present

## 2022-07-28 DIAGNOSIS — Z1152 Encounter for screening for COVID-19: Secondary | ICD-10-CM | POA: Diagnosis not present

## 2022-07-28 DIAGNOSIS — F419 Anxiety disorder, unspecified: Secondary | ICD-10-CM | POA: Diagnosis present

## 2022-07-28 DIAGNOSIS — E876 Hypokalemia: Secondary | ICD-10-CM | POA: Diagnosis present

## 2022-07-28 DIAGNOSIS — I10 Essential (primary) hypertension: Secondary | ICD-10-CM | POA: Diagnosis present

## 2022-07-28 DIAGNOSIS — Z8659 Personal history of other mental and behavioral disorders: Secondary | ICD-10-CM

## 2022-07-28 DIAGNOSIS — E785 Hyperlipidemia, unspecified: Secondary | ICD-10-CM | POA: Diagnosis present

## 2022-07-28 DIAGNOSIS — E1169 Type 2 diabetes mellitus with other specified complication: Secondary | ICD-10-CM

## 2022-07-28 DIAGNOSIS — Z720 Tobacco use: Secondary | ICD-10-CM | POA: Diagnosis present

## 2022-07-28 DIAGNOSIS — E119 Type 2 diabetes mellitus without complications: Secondary | ICD-10-CM

## 2022-07-28 DIAGNOSIS — K219 Gastro-esophageal reflux disease without esophagitis: Secondary | ICD-10-CM | POA: Diagnosis present

## 2022-07-28 DIAGNOSIS — Z7984 Long term (current) use of oral hypoglycemic drugs: Secondary | ICD-10-CM | POA: Diagnosis not present

## 2022-07-28 DIAGNOSIS — Z888 Allergy status to other drugs, medicaments and biological substances status: Secondary | ICD-10-CM

## 2022-07-28 DIAGNOSIS — Z716 Tobacco abuse counseling: Secondary | ICD-10-CM | POA: Diagnosis not present

## 2022-07-28 LAB — COMPREHENSIVE METABOLIC PANEL
ALT: 21 U/L (ref 0–44)
AST: 21 U/L (ref 15–41)
Albumin: 3.9 g/dL (ref 3.5–5.0)
Alkaline Phosphatase: 96 U/L (ref 38–126)
Anion gap: 11 (ref 5–15)
BUN: 14 mg/dL (ref 8–23)
CO2: 28 mmol/L (ref 22–32)
Calcium: 10 mg/dL (ref 8.9–10.3)
Chloride: 98 mmol/L (ref 98–111)
Creatinine, Ser: 0.71 mg/dL (ref 0.44–1.00)
GFR, Estimated: >60 mL/min (ref >60)
Glucose, Bld: 111 mg/dL — ABNORMAL HIGH (ref 70–99)
Potassium: 3.1 mmol/L — ABNORMAL LOW (ref 3.5–5.1)
Sodium: 137 mmol/L (ref 135–145)
Total Bilirubin: 0.9 mg/dL (ref 0.3–1.2)
Total Protein: 7.3 g/dL (ref 6.5–8.1)

## 2022-07-28 LAB — CBC WITH DIFFERENTIAL/PLATELET
Abs Immature Granulocytes: 0.05 10*3/uL (ref 0.00–0.07)
Basophils Absolute: 0.1 10*3/uL (ref 0.0–0.1)
Basophils Relative: 0 %
Eosinophils Absolute: 0 10*3/uL (ref 0.0–0.5)
Eosinophils Relative: 0 %
HCT: 49.5 % — ABNORMAL HIGH (ref 36.0–46.0)
Hemoglobin: 16.7 g/dL — ABNORMAL HIGH (ref 12.0–15.0)
Immature Granulocytes: 0 %
Lymphocytes Relative: 32 %
Lymphs Abs: 4.7 10*3/uL — ABNORMAL HIGH (ref 0.7–4.0)
MCH: 28.8 pg (ref 26.0–34.0)
MCHC: 33.7 g/dL (ref 30.0–36.0)
MCV: 85.3 fL (ref 80.0–100.0)
Monocytes Absolute: 0.6 10*3/uL (ref 0.1–1.0)
Monocytes Relative: 4 %
Neutro Abs: 9.3 10*3/uL — ABNORMAL HIGH (ref 1.7–7.7)
Neutrophils Relative %: 64 %
Platelets: 285 10*3/uL (ref 150–400)
RBC: 5.8 MIL/uL — ABNORMAL HIGH (ref 3.87–5.11)
RDW: 13.4 % (ref 11.5–15.5)
WBC: 14.7 10*3/uL — ABNORMAL HIGH (ref 4.0–10.5)
nRBC: 0 % (ref 0.0–0.2)

## 2022-07-28 LAB — MAGNESIUM: Magnesium: 2.1 mg/dL (ref 1.7–2.4)

## 2022-07-28 LAB — BLOOD GAS, ARTERIAL
Acid-Base Excess: 7.9 mmol/L — ABNORMAL HIGH (ref 0.0–2.0)
Bicarbonate: 30.8 mmol/L — ABNORMAL HIGH (ref 20.0–28.0)
O2 Content: 2 L/min
O2 Saturation: 92.3 %
Patient temperature: 37
pCO2 arterial: 36 mmHg (ref 32–48)
pH, Arterial: 7.54 — ABNORMAL HIGH (ref 7.35–7.45)
pO2, Arterial: 58 mmHg — ABNORMAL LOW (ref 83–108)

## 2022-07-28 LAB — RESP PANEL BY RT-PCR (FLU A&B, COVID) ARPGX2
Influenza A by PCR: NEGATIVE
Influenza B by PCR: NEGATIVE
SARS Coronavirus 2 by RT PCR: NEGATIVE

## 2022-07-28 LAB — BRAIN NATRIURETIC PEPTIDE: B Natriuretic Peptide: 22.1 pg/mL (ref 0.0–100.0)

## 2022-07-28 LAB — GLUCOSE, CAPILLARY: Glucose-Capillary: 207 mg/dL — ABNORMAL HIGH (ref 70–99)

## 2022-07-28 LAB — CBG MONITORING, ED: Glucose-Capillary: 181 mg/dL — ABNORMAL HIGH (ref 70–99)

## 2022-07-28 LAB — D-DIMER, QUANTITATIVE: D-Dimer, Quant: <0.27 ug/mL-FEU (ref 0.00–0.50)

## 2022-07-28 MED ORDER — HYDROCHLOROTHIAZIDE 25 MG PO TABS
25.0000 mg | ORAL_TABLET | Freq: Every day | ORAL | Status: DC
  Administered 2022-07-29 – 2022-08-01 (×4): 25 mg via ORAL
  Filled 2022-07-28 (×4): qty 1

## 2022-07-28 MED ORDER — ONDANSETRON HCL 4 MG/2ML IJ SOLN: 4.0000 mg | Freq: Four times a day (QID) | INTRAMUSCULAR | Status: DC | PRN

## 2022-07-28 MED ORDER — ENOXAPARIN SODIUM 40 MG/0.4ML IJ SOSY
40.0000 mg | PREFILLED_SYRINGE | INTRAMUSCULAR | Status: DC
  Administered 2022-07-28 – 2022-07-31 (×4): 40 mg via SUBCUTANEOUS
  Filled 2022-07-28 (×4): qty 0.4

## 2022-07-28 MED ORDER — IPRATROPIUM-ALBUTEROL 0.5-2.5 (3) MG/3ML IN SOLN
3.0000 mL | Freq: Once | RESPIRATORY_TRACT | Status: AC
  Administered 2022-07-28: 3 mL via RESPIRATORY_TRACT
  Filled 2022-07-28: qty 3

## 2022-07-28 MED ORDER — PREDNISONE 20 MG PO TABS
40.0000 mg | ORAL_TABLET | Freq: Every day | ORAL | Status: DC
  Administered 2022-07-30 – 2022-08-01 (×3): 40 mg via ORAL
  Filled 2022-07-28 (×3): qty 2

## 2022-07-28 MED ORDER — UMECLIDINIUM BROMIDE 62.5 MCG/ACT IN AEPB
1.0000 | INHALATION_SPRAY | Freq: Every day | RESPIRATORY_TRACT | Status: DC
  Administered 2022-07-29 – 2022-08-01 (×4): 1 via RESPIRATORY_TRACT
  Filled 2022-07-28: qty 7

## 2022-07-28 MED ORDER — AMLODIPINE BESYLATE 10 MG PO TABS
10.0000 mg | ORAL_TABLET | Freq: Every day | ORAL | Status: DC
  Administered 2022-07-29 – 2022-08-01 (×4): 10 mg via ORAL
  Filled 2022-07-28 (×4): qty 1

## 2022-07-28 MED ORDER — IOHEXOL 350 MG/ML SOLN
75.0000 mL | Freq: Once | INTRAVENOUS | Status: AC | PRN
  Administered 2022-07-28: 75 mL via INTRAVENOUS

## 2022-07-28 MED ORDER — SODIUM CHLORIDE 0.9 % IV SOLN: 1.0000 g | INTRAVENOUS | Status: DC

## 2022-07-28 MED ORDER — BENZONATATE 100 MG PO CAPS
100.0000 mg | ORAL_CAPSULE | Freq: Three times a day (TID) | ORAL | Status: DC
  Administered 2022-07-28 – 2022-08-01 (×12): 100 mg via ORAL
  Filled 2022-07-28 (×12): qty 1

## 2022-07-28 MED ORDER — FAMOTIDINE 20 MG PO TABS
20.0000 mg | ORAL_TABLET | Freq: Every day | ORAL | Status: DC
  Administered 2022-07-28 – 2022-08-01 (×5): 20 mg via ORAL
  Filled 2022-07-28 (×5): qty 1

## 2022-07-28 MED ORDER — ALBUTEROL SULFATE (2.5 MG/3ML) 0.083% IN NEBU: 2.5000 mg | INHALATION_SOLUTION | RESPIRATORY_TRACT | Status: DC | PRN

## 2022-07-28 MED ORDER — NICOTINE 21 MG/24HR TD PT24
21.0000 mg | MEDICATED_PATCH | Freq: Every day | TRANSDERMAL | Status: DC
  Filled 2022-07-28 (×4): qty 1

## 2022-07-28 MED ORDER — ROSUVASTATIN CALCIUM 10 MG PO TABS
40.0000 mg | ORAL_TABLET | Freq: Every day | ORAL | Status: DC
  Administered 2022-07-28 – 2022-07-31 (×4): 40 mg via ORAL
  Filled 2022-07-28 (×2): qty 4
  Filled 2022-07-28: qty 2
  Filled 2022-07-28 (×2): qty 4

## 2022-07-28 MED ORDER — ADULT MULTIVITAMIN W/MINERALS CH
1.0000 | ORAL_TABLET | Freq: Every day | ORAL | Status: DC
  Administered 2022-07-29 – 2022-08-01 (×4): 1 via ORAL
  Filled 2022-07-28 (×4): qty 1

## 2022-07-28 MED ORDER — POTASSIUM CHLORIDE CRYS ER 20 MEQ PO TBCR
40.0000 meq | EXTENDED_RELEASE_TABLET | Freq: Once | ORAL | Status: AC
  Administered 2022-07-28: 40 meq via ORAL
  Filled 2022-07-28: qty 2

## 2022-07-28 MED ORDER — SODIUM CHLORIDE 0.9 % IV SOLN
500.0000 mg | Freq: Once | INTRAVENOUS | Status: AC
  Administered 2022-07-28: 500 mg via INTRAVENOUS
  Filled 2022-07-28: qty 5

## 2022-07-28 MED ORDER — FLUTICASONE FUROATE-VILANTEROL 100-25 MCG/ACT IN AEPB
1.0000 | INHALATION_SPRAY | Freq: Every day | RESPIRATORY_TRACT | Status: DC
  Administered 2022-07-29 – 2022-08-01 (×4): 1 via RESPIRATORY_TRACT
  Filled 2022-07-28: qty 28

## 2022-07-28 MED ORDER — SODIUM CHLORIDE 0.9 % IV SOLN: INTRAVENOUS | Status: DC

## 2022-07-28 MED ORDER — SODIUM CHLORIDE 0.9 % IV SOLN
1.0000 g | INTRAVENOUS | Status: DC
  Filled 2022-07-28: qty 10

## 2022-07-28 MED ORDER — CLONAZEPAM 0.5 MG PO TABS
0.5000 mg | ORAL_TABLET | Freq: Three times a day (TID) | ORAL | Status: DC | PRN
  Administered 2022-07-30 – 2022-08-01 (×3): 0.5 mg via ORAL
  Filled 2022-07-28 (×4): qty 1

## 2022-07-28 MED ORDER — METHYLPREDNISOLONE SODIUM SUCC 40 MG IJ SOLR
40.0000 mg | Freq: Two times a day (BID) | INTRAMUSCULAR | Status: AC
  Administered 2022-07-28 – 2022-07-29 (×2): 40 mg via INTRAVENOUS
  Filled 2022-07-28 (×2): qty 1

## 2022-07-28 MED ORDER — METHYLPREDNISOLONE SODIUM SUCC 125 MG IJ SOLR
125.0000 mg | Freq: Once | INTRAMUSCULAR | Status: AC
  Administered 2022-07-28: 125 mg via INTRAVENOUS
  Filled 2022-07-28: qty 2

## 2022-07-28 MED ORDER — ACETAMINOPHEN 500 MG PO TABS
1000.0000 mg | ORAL_TABLET | Freq: Four times a day (QID) | ORAL | Status: DC | PRN
  Administered 2022-07-29 – 2022-07-30 (×2): 1000 mg via ORAL
  Filled 2022-07-28 (×2): qty 2

## 2022-07-28 MED ORDER — OXYCODONE HCL 5 MG PO TABS: 5.0000 mg | ORAL_TABLET | ORAL | Status: DC | PRN

## 2022-07-28 MED ORDER — ONDANSETRON HCL 4 MG PO TABS: 4.0000 mg | ORAL_TABLET | Freq: Four times a day (QID) | ORAL | Status: DC | PRN

## 2022-07-28 MED ORDER — SODIUM CHLORIDE 0.9 % IV SOLN
1.0000 g | Freq: Once | INTRAVENOUS | Status: AC
  Administered 2022-07-28: 1 g via INTRAVENOUS
  Filled 2022-07-28: qty 10

## 2022-07-28 MED ORDER — INSULIN ASPART 100 UNIT/ML IJ SOLN
0.0000 [IU] | Freq: Three times a day (TID) | INTRAMUSCULAR | Status: DC
  Administered 2022-07-28: 3 [IU] via SUBCUTANEOUS
  Administered 2022-07-29 (×3): 2 [IU] via SUBCUTANEOUS
  Administered 2022-07-30: 3 [IU] via SUBCUTANEOUS
  Administered 2022-07-31 (×2): 2 [IU] via SUBCUTANEOUS
  Administered 2022-08-01: 3 [IU] via SUBCUTANEOUS
  Filled 2022-07-28 (×9): qty 1

## 2022-07-28 NOTE — ED Triage Notes (Signed)
Pt states on the 15th she has 3 lymph nodes removed- for the past week she has felt increasingly SHOB- pt was seen at [facility] for it originally but they was sent home- today she could not get any relief with her inhaler- pt on 4L [place] acute

## 2022-07-28 NOTE — ED Notes (Signed)
Patient placed on 2 Liters of oxygen per [employer].

## 2022-07-28 NOTE — Assessment & Plan Note (Signed)
Continue clonazepam as needed. ?

## 2022-07-28 NOTE — Assessment & Plan Note (Addendum)
Smoking cessation has been discussed with patient in detail nicotine transdermal patch 21 mg daily

## 2022-07-28 NOTE — ED Notes (Signed)
Called cpod RN, tech will come for pt.

## 2022-07-28 NOTE — Assessment & Plan Note (Signed)
Continue amlodipine and HCTZ

## 2022-07-28 NOTE — Assessment & Plan Note (Addendum)
She failed outpatient treatment --started on IV solumedrol, scheduled nebs, azithromycin and ceftriaxone, ceftriaxone since d/c'ed. Plan: --cont steroid --cont scheduled nebs --cont azithromycin

## 2022-07-28 NOTE — ED Provider Notes (Signed)
[facility] Provider Note    Event Date/Time   First MD Initiated Contact with Patient 07/28/22 1321     (approximate)   History   Shortness of Breath   HPI  Alexandria [name] is a 66 y.o. female with recent diagnosis of breast cancer not currently on chemotherapy or radiation yet presents to the ER for evaluation of worsening shortness of breath.  She has been diagnosed with COPD was recently given a course of steroids albuterol treatment as well as Doxy and then a course of Augmentin over the past 10 days with progressive worsening shortness of breath having fatigue poor appetite.  Patient presented to the ER today due to lack of improvement was found to be hypoxic in the low 80s requiring supplemental oxygen.     Physical Exam   Triage Vital Signs: ED Triage Vitals  Enc Vitals Group     BP 07/28/22 1133 117/75     Pulse Rate 07/28/22 1133 93     Resp 07/28/22 1133 19     Temp 07/28/22 1133 97.6 F (36.4 C)     Temp Source 07/28/22 1133 Oral     SpO2 07/28/22 1133 (!) 86 %     Weight 07/28/22 1137 155 lb (70.3 kg)     Height 07/28/22 1137 '5\' 7"'$  (1.702 m)     Head Circumference --      Peak Flow --      Pain Score 07/28/22 1137 0     Pain Loc --      Pain Edu? --      Excl. in [place]? --     Most recent vital signs: Vitals:   07/28/22 1400 07/28/22 1430  BP: 120/69 137/64  Pulse: 78 80  Resp: 15 (!) 24  Temp:    SpO2: 100% 93%     Constitutional: Alert  Eyes: Conjunctivae are normal.  Head: Atraumatic. Nose: No congestion/rhinnorhea. Mouth/Throat: Mucous membranes are moist.   Neck: Painless ROM.  Cardiovascular:   Good peripheral circulation. Respiratory: Mild tachypnea with acute respiratory failure with hypoxia requiring supplemental oxygen.  Does have diffuse coarse expiratory wheeze in all lung fields. Gastrointestinal: Soft and nontender.  Musculoskeletal:  no deformity Neurologic:  MAE spontaneously. No gross focal neurologic  deficits are appreciated.  Skin:  Skin is warm, dry and intact. No rash noted. Psychiatric: Mood and affect are normal. Speech and behavior are normal.    ED Results / Procedures / Treatments   Labs (all labs ordered are listed, but only abnormal results are displayed) Labs Reviewed  CBC WITH DIFFERENTIAL/PLATELET - Abnormal; Notable for the following components:      Result Value   WBC 14.7 (*)    RBC 5.80 (*)    Hemoglobin 16.7 (*)    HCT 49.5 (*)    Neutro Abs 9.3 (*)    Lymphs Abs 4.7 (*)    All other components within normal limits  COMPREHENSIVE METABOLIC PANEL - Abnormal; Notable for the following components:   Potassium 3.1 (*)    Glucose, Bld 111 (*)    All other components within normal limits  RESP PANEL BY RT-PCR (FLU A&B, COVID) ARPGX2  CULTURE, BLOOD (ROUTINE X 2)  CULTURE, BLOOD (ROUTINE X 2)  D-DIMER, QUANTITATIVE  BRAIN NATRIURETIC PEPTIDE     EKG  ED ECG REPORT I, Merlyn Lot, the attending physician, personally viewed and interpreted this ECG.   Date: 07/28/2022  EKG Time: 11:39  Rate: 90  Rhythm:  sinus  Axis: normal  Intervals:normal intervals  ST&T Change: no stemi, no depressions    RADIOLOGY Please see ED Course for my review and interpretation.  I personally reviewed all radiographic images ordered to evaluate for the above acute complaints and reviewed radiology reports and findings.  These findings were personally discussed with the patient.  Please see medical record for radiology report.    PROCEDURES:  Critical Care performed: Yes, see critical care procedure note(s)  .Critical Care  Performed by: Merlyn Lot, MD Authorized by: Merlyn Lot, MD   Critical care provider statement:    Critical care time (minutes):  40   Critical care was necessary to treat or prevent imminent or life-threatening deterioration of the following conditions:  Respiratory failure   Critical care was time spent personally by me on  the following activities:  Ordering and performing treatments and interventions, ordering and review of laboratory studies, ordering and review of radiographic studies, pulse oximetry, re-evaluation of patient's condition, review of old charts, obtaining history from patient or surrogate, examination of patient, evaluation of patient's response to treatment, discussions with primary provider, discussions with consultants and development of treatment plan with patient or surrogate    MEDICATIONS ORDERED IN ED: Medications  cefTRIAXone (ROCEPHIN) 1 g in sodium chloride 0.9 % 100 mL IVPB (has no administration in time range)  azithromycin (ZITHROMAX) 500 mg in sodium chloride 0.9 % 250 mL IVPB (has no administration in time range)  ipratropium-albuterol (DUONEB) 0.5-2.5 (3) MG/3ML nebulizer solution 3 mL (3 mLs Nebulization Given 07/28/22 1353)  ipratropium-albuterol (DUONEB) 0.5-2.5 (3) MG/3ML nebulizer solution 3 mL (3 mLs Nebulization Given 07/28/22 1352)  methylPREDNISolone sodium succinate (SOLU-MEDROL) 125 mg/2 mL injection 125 mg (125 mg Intravenous Given 07/28/22 1353)  iohexol (OMNIPAQUE) 350 MG/ML injection 75 mL (75 mLs Intravenous Contrast Given 07/28/22 1451)     IMPRESSION / MDM / [place] / ED COURSE  I reviewed the triage vital signs and the nursing notes.                              Differential diagnosis includes, but is not limited to, Asthma, copd, CHF, pna, ptx, malignancy, Pe, anemia  Patient presenting to the ER for evaluation of symptoms as described above.  Based on symptoms, risk factors and considered above differential, this presenting complaint could reflect a potentially life-threatening illness therefore the patient will be placed on continuous pulse oximetry and telemetry for monitoring.  Laboratory evaluation will be sent to evaluate for the above complaints.   CXR on my review and interpretation    Clinical Course as of 07/28/22 1522  Sun Jul 28, 2022  1508 CTA on my review and interpretation does not show any evidence of large pulmonary embolism.  Will consult hospitalist for admission for COPD exacerbation with acute respiratory failure with hypoxia. [PR]    Clinical Course User Index [PR] Merlyn Lot, MD    FINAL CLINICAL IMPRESSION(S) / ED DIAGNOSES   Final diagnoses:  Acute respiratory failure with hypoxia ([place])     Rx / DC Orders   ED Discharge Orders     None        Note:  This document was prepared using Dragon voice recognition software and may include unintentional dictation errors.    Merlyn Lot, MD 07/28/22 [phone]

## 2022-07-28 NOTE — Assessment & Plan Note (Signed)
Recently diagnosed and status post left breast lumpectomy with lymph node dissection Follow-up with oncology as an outpatient

## 2022-07-28 NOTE — ED Triage Notes (Signed)
Pt presents via EMS from home c/o SOB x1 week. Seen at Urgent Care x1 week ago with COPD exacerbation. EMS reports diffuse wheezing. 94% 02 saturation on room air per EMS. No relief with rescue inhaler per EMS. Reports SOB increased with exertion. + productive cough.   20G right forearm. Albuterol given by EMS.

## 2022-07-28 NOTE — H&P (Signed)
History and Physical    Patient: Alexandria [name] PJK:[id number] DOB: 08-23-1956 DOA: 07/28/2022 DOS: the patient was seen and examined on 07/28/2022 PCP: Gwyneth Sprout, FNP  Patient coming from: Home  Chief Complaint:  Chief Complaint  Patient presents with   Shortness of Breath   HPI: ABISAI COBLE is a 66 y.o. female with medical history significant for COPD, anxiety, depression, diabetes mellitus, GERD, hypertension, recently diagnosed left breast cancer status post lumpectomy with lymph node dissection who presents to the ER for evaluation of worsening shortness of breath over the last 1 week. Patient states that she has had progressively worsening dyspnea with exertion associated with a cough productive of greenish phlegm.  She denies having any fever or chills.  She had a virtual visit with her primary care provider and was prescribed doxycycline and then 2 days later she went to the urgent care center where she was prescribed.  She has not had any relief from her symptoms despite taking the medications as prescribed.  She has had nausea, vomiting and diarrhea related to the antibiotic use.  She states that at the urgent care she had pulse oximetry of 88% on room air and they recommended that she use her bronchodilator with a spacer she has used as recommended. She denies having any chest pain, no abdominal pain, no urinary symptoms, no headache, no dizziness, no lightheadedness, no leg swelling, no urinary symptoms, no changes in bowel habits, no blurred vision or focal deficit. She was noted to be hypoxic upon arrival to the ER with room air pulse oximetry in the low 80s and initially required 4 L of oxygen by nasal cannula but has been weaned down to 2 L She will be admitted to the hospital for acute COPD exacerbation.    Review of Systems: As mentioned in the history of present illness. All other systems reviewed and are negative. Past Medical History:  Diagnosis Date   Anxiety    Cancer  ([place])    Carbuncle and furuncle of trunk    COPD (chronic obstructive pulmonary disease) (HCC)    Depression    Diabetes mellitus without complication (HCC)    Dyspnea    GERD (gastroesophageal reflux disease)    HTN (hypertension)    Hyperlipidemia    Intrinsic asthma, unspecified    Methicillin resistant Staphylococcus aureus in conditions classified elsewhere and of unspecified site    Other psychological or physical stress, not elsewhere classified(V62.89)    Pneumonia    Pre-diabetes    Tobacco abuse    Past Surgical History:  Procedure Laterality Date   BREAST BIOPSY Left 06/26/2022   stereo biopsy/ coil clip/ path pending   BREAST [place] Left 07/17/2022   Procedure: BREAST LUMPECTOMY,RADIO FREQ LOCALIZER,AXILLARY SENTINEL LYMPH NODE BIOPSY;  Surgeon: Olean Ree, MD;  Location: [facility];  Service: General;  Laterality: Left;   COLONOSCOPY WITH PROPOFOL N/A 04/10/2015   Procedure: COLONOSCOPY WITH PROPOFOL;  Surgeon: Lucilla Lame, MD;  Location: [place];  Service: Endoscopy;  Laterality: N/A;   COLONOSCOPY WITH PROPOFOL N/A 06/25/2021   Procedure: COLONOSCOPY WITH PROPOFOL;  Surgeon: Lucilla Lame, MD;  Location: [place];  Service: Endoscopy;  Laterality: N/A;  leave at 8 arrival   ESOPHAGOGASTRODUODENOSCOPY (EGD) WITH PROPOFOL N/A 12/06/2021   Procedure: ESOPHAGOGASTRODUODENOSCOPY (EGD) WITH PROPOFOL;  Surgeon: Lucilla Lame, MD;  Location: [place];  Service: Endoscopy;  Laterality: N/A;   POLYPECTOMY  04/10/2015   Procedure: POLYPECTOMY;  Surgeon: Lucilla Lame, MD;  Location: [place];  Service: Endoscopy;;   POLYPECTOMY N/A 06/25/2021   Procedure: POLYPECTOMY;  Surgeon: Lucilla Lame, MD;  Location: [place];  Service: Endoscopy;  Laterality: N/A;   STATUS POST ENDOMETRIAL ABLATION     TONSILLECTOMY     TUBAL LIGATION     bilateral, sterile ablation    VARICOSE VEINS     Social History:  reports that she has been smoking cigarettes. She has a 15.00 pack-year smoking history. She has never used smokeless tobacco. She reports that she does not drink alcohol and does not use drugs.  Allergies  Allergen Reactions   Pravastatin Sodium     Myalgia    Family History  Problem Relation Age of Onset   Coronary artery disease Mother    Hypertension Mother    Rheum arthritis Mother    Heart attack Mother    Lung cancer Father    Emphysema Father    Gout Father    Diabetes Sister    Hypertension Daughter    Hypertension Daughter    Obesity Daughter    Seizures Daughter    Breast cancer Neg Hx    Bladder Cancer Neg Hx    Kidney cancer Neg Hx     Prior to Admission medications   Medication Sig Start Date End Date Taking? Authorizing Provider  acetaminophen (TYLENOL) 500 MG tablet Take 1,000 mg by mouth every 6 (six) hours as needed for mild pain.    [provider]  acetaminophen (TYLENOL) 500 MG tablet Take 2 tablets (1,000 mg total) by mouth every 6 (six) hours as needed for mild pain. 07/17/22   Piscoya, Jacqulyn Bath, MD  albuterol (VENTOLIN HFA) 108 (90 Base) MCG/ACT inhaler Inhale 1-2 puffs into the lungs every 4 (four) hours as needed for wheezing or shortness of breath. 07/24/22   Melynda Ripple, MD  Albuterol Sulfate (PROAIR RESPICLICK) 161 (90 Base) MCG/ACT AEPB Inhale 2 puffs into the lungs 4 (four) times daily as needed. 02/19/22   Gwyneth Sprout, FNP  amLODipine (NORVASC) 10 MG tablet Take 1 tablet (10 mg total) by mouth daily. 01/04/22   Gwyneth Sprout, FNP  amoxicillin-clavulanate (AUGMENTIN) 875-125 MG tablet Take 1 tablet by mouth every 12 (twelve) hours. 07/24/22   Melynda Ripple, MD  benzonatate (TESSALON) 100 MG capsule Take 1 capsule (100 mg total) by mouth 3 (three) times daily as needed. 07/22/22   Mar Daring, PA-C  cetirizine (ZYRTEC) 10 MG tablet Take 1 tablet (10 mg total) by mouth daily. 01/13/20    Mar Daring, PA-C  clonazePAM (KLONOPIN) 0.5 MG tablet TAKE ONE-HALF TABLET BY MOUTH 3  TIMES DAILY AS NEEDED FOR  ANXIETY 05/30/22   Gwyneth Sprout, FNP  doxycycline (VIBRA-TABS) 100 MG tablet Take 1 tablet (100 mg total) by mouth 2 (two) times daily. 07/22/22   Mar Daring, PA-C  famotidine (PEPCID) 20 MG tablet Take 20 mg by mouth as needed for heartburn.    [provider]  hydrochlorothiazide (HYDRODIURIL) 25 MG tablet TAKE 1 TABLET BY MOUTH DAILY 05/17/22   Tally Joe T, FNP  ibuprofen (ADVIL) 600 MG tablet Take 1 tablet (600 mg total) by mouth every 8 (eight) hours as needed for moderate pain. 07/17/22   Olean Ree, MD  loperamide (IMODIUM) 2 MG capsule Take 1 capsule (2 mg total) by mouth 4 (four) times daily as needed for diarrhea or loose stools. 07/08/22   Gwyneth Sprout, FNP  metFORMIN (  GLUCOPHAGE-XR) 750 MG 24 hr tablet Take 750 mg by mouth daily with supper.    [provider]  MULTIPLE VITAMIN PO Take 1 tablet by mouth daily. Reported on 03/13/2016    [provider]  ondansetron (ZOFRAN-ODT) 4 MG disintegrating tablet Take 1 tablet (4 mg total) by mouth every 8 (eight) hours as needed for nausea or vomiting. 07/22/22   Mar Daring, PA-C  oxyCODONE (OXY IR/ROXICODONE) 5 MG immediate release tablet Take 1 tablet (5 mg total) by mouth every 4 (four) hours as needed for severe pain. 07/17/22   Olean Ree, MD  predniSONE (DELTASONE) 20 MG tablet Take 2 tablets (40 mg total) by mouth daily with breakfast. 07/22/22   Mar Daring, PA-C  rosuvastatin (CRESTOR) 40 MG tablet Take 1 tablet (40 mg total) by mouth daily. 04/12/22   Gwyneth Sprout, FNP  Spacer/Aero-Holding Chambers (AEROCHAMBER MV) inhaler Use as instructed 07/24/22   Melynda Ripple, MD  TRELEGY ELLIPTA 100-62.5-25 MCG/ACT AEPB USE 1 INHALATION BY MOUTH DAILY 06/20/22   Gwyneth Sprout, FNP  fluticasone Asencion Islam) 50 MCG/ACT nasal spray Place 2 sprays into both  nostrils daily. 12/09/19 01/06/20  Mar Daring, PA-C    Physical Exam: Vitals:   07/28/22 1137 07/28/22 1330 07/28/22 1400 07/28/22 1430  BP:  (!) 130/101 120/69 137/64  Pulse:  83 78 80  Resp:  (!) 21 15 (!) 24  Temp:      TempSrc:      SpO2: 92% 94% 100% 93%  Weight: 70.3 kg     Height: '5\' 7"'$  (1.702 m)      Physical Exam Vitals and nursing note reviewed.  Constitutional:      Appearance: She is well-developed.  HENT:     Head: Normocephalic and atraumatic.     Mouth/Throat:     Mouth: Mucous membranes are moist.  Eyes:     Pupils: Pupils are equal, round, and reactive to light.  Cardiovascular:     Rate and Rhythm: Normal rate and regular rhythm.  Pulmonary:     Effort: Tachypnea present.     Breath sounds: Examination of the right-upper field reveals wheezing. Examination of the left-upper field reveals wheezing. Examination of the right-middle field reveals wheezing. Examination of the left-middle field reveals wheezing. Examination of the right-lower field reveals wheezing. Examination of the left-lower field reveals wheezing. Wheezing present.  Abdominal:     General: Bowel sounds are normal.     Palpations: Abdomen is soft.  Musculoskeletal:        General: Normal range of motion.     Cervical back: Normal range of motion and neck supple.  Skin:    General: Skin is warm and dry.  Neurological:     General: No focal deficit present.     Mental Status: She is alert.  Psychiatric:        Mood and Affect: Mood normal.        Behavior: Behavior normal.     Data Reviewed: Relevant notes from primary care and specialist visits, past discharge summaries as available in EHR, including Care Everywhere. Prior diagnostic testing as pertinent to current admission diagnoses Updated medications and problem lists for reconciliation ED course, including vitals, labs, imaging, treatment and response to treatment Triage notes, nursing and pharmacy notes and ED provider's  notes Notable results as noted in HPI Labs reviewed.  BNP 22.1, sodium 137, potassium 3.1, chloride 98, bicarb 28, glucose 111, BUN 14, creatinine 0.71, calcium 10.0, total protein  7.3, albumin 3.9, AST 21, ALT 21, alkaline phosphatase 96, D-dimer less than 0.27, white count 14.7, hemoglobin 16.7, platelet count 285 Respiratory viral panel is negative Chest x-ray reviewed by me shows clear lungs CTA showed no evidence for acute pulmonary embolus. Circumferential bronchial wall thickening with basilar predominant micro nodularity, right greater than left. Imaging features are compatible with an infectious/inflammatory etiology. Atypical infection should be considered. Given the history of breast cancer, metastatic disease cannot be completely excluded but is considered less likely. Gas in the soft tissues of the left breast including a 2.8 x 1.9 cm collection of gas and fluid in the medial left breast. Soft tissue gas is not typically expected more than 7-10 days after surgery so this finding is at the outer limits of expected normal postoperative appearance. 3 mm right middle lobe pulmonary nodule. Likely related to the above described process. Follow-up recommended to ensure resolution. Twelve-lead EKG reviewed by me shows normal sinus rhythm with nonspecific ST changes There are no new results to review at this time.  Assessment and Plan: * COPD with acute exacerbation ([place]) Patient with a known history of COPD who presents to the ER for evaluation of worsening shortness of breath with exertion associated with a cough productive of yellow phlegm and hypoxia with room air pulse oximetry in the low 80s She failed outpatient treatment Continue oxygen supplementation to maintain pulse oximetry greater than 92%.  She is currently on 2 L of oxygen Continue scheduled and as needed bronchodilator therapy Continue systemic and inhaled steroids Place patient on IV Rocephin 1 g daily to complete a 5-day  course of therapy She will need to be assessed for home oxygen need prior to discharge  Hypokalemia Secondary to diuretic use Supplement potassium Check magnesium levels  Diabetes mellitus (HCC) On metformin which will be placed on hold during this hospitalization Expect hyperglycemia since she is on systemic steroids Glycemic control with sliding scale insulin Maintain consistent carbohydrate diet  Breast cancer, left (HCC) Recently diagnosed and status post left breast lumpectomy with lymph node dissection Follow-up with oncology as an outpatient  History of anxiety Continue clonazepam as needed  HTN (hypertension) Continue amlodipine and HCTZ  Tobacco abuse Smoking cessation has been discussed with patient in detail We will place patient on a nicotine transdermal patch 21 mg daily      Advance Care Planning:   Code Status: Full Code   Consults: None  Family Communication: Greater than 50% of time was spent discussing patient's condition and plan of care with her at the bedside.  All questions and concerns have been addressed.  She verbalizes understanding and agrees with the plan.  Severity of Illness: The appropriate patient status for this patient is INPATIENT. Inpatient status is judged to be reasonable and necessary in order to provide the required intensity of service to ensure the patient's safety. The patient's presenting symptoms, physical exam findings, and initial radiographic and laboratory data in the context of their chronic comorbidities is felt to place them at high risk for further clinical deterioration. Furthermore, it is not anticipated that the patient will be medically stable for discharge from the hospital within 2 midnights of admission.   * I certify that at the point of admission it is my clinical judgment that the patient will require inpatient hospital care spanning beyond 2 midnights from the point of admission due to high intensity of service,  high risk for further deterioration and high frequency of surveillance required.*  Author: Royce Macadamia  Megumi Treaster, MD 07/28/2022 4:41 PM  For on call review [email or web address].

## 2022-07-28 NOTE — ED Provider Triage Note (Signed)
  Emergency Medicine Provider Triage Evaluation Note  Alexandria [name] , a 66 y.o.female,  was evaluated in triage.  Pt complains of shortness of breath.  She states that on the 15th, she had 3 lymph nodes removed.  Over the past week she has felt increasingly short of breath.  She was seen at urgent care originally, however since being home she states that she feels like she cannot catch her breath.  She has been unable to get relief with her inhaler.  Patient states that she has also had a productive cough and shortness of breath with exertion.   Review of Systems  Positive: Shortness of breath, productive cough. Negative: Denies fever, chest pain, vomiting  Physical Exam   Vitals:   07/28/22 1133 07/28/22 1137  BP: 117/75   Pulse: 93   Resp: 19   Temp: 97.6 F (36.4 C)   SpO2: (!) 86% 92%   Gen:   Awake, no distress   Resp:  Normal effort  MSK:   Moves extremities without difficulty  Other:  Currently on 4 L nasal cannula.  Medical Decision Making  Given the patient's initial medical screening exam, the following diagnostic evaluation has been ordered. The patient will be placed in the appropriate treatment space, once one is available, to complete the evaluation and treatment. I have discussed the plan of care with the patient and I have advised the patient that an ED physician or mid-level practitioner will reevaluate their condition after the test results have been received, as the results may give them additional insight into the type of treatment they may need.    Diagnostics: Labs, CXR, EKG  Treatments: none immediately   Teodoro Spray, [place] 07/28/22 1206

## 2022-07-28 NOTE — Assessment & Plan Note (Addendum)
With hyperglycemia On metformin which will be placed on hold during this hospitalization Expect hyperglycemia since she is on systemic steroids Glycemic control with sliding scale insulin Maintain consistent carbohydrate diet

## 2022-07-28 NOTE — Assessment & Plan Note (Signed)
Secondary to diuretic use Supplement potassium Check magnesium levels 

## 2022-07-28 NOTE — ED Notes (Signed)
Dr Agbata at bedside. 

## 2022-07-29 ENCOUNTER — Encounter: Payer: Self-pay | Admitting: *Deleted

## 2022-07-29 DIAGNOSIS — J441 Chronic obstructive pulmonary disease with (acute) exacerbation: Secondary | ICD-10-CM | POA: Diagnosis not present

## 2022-07-29 DIAGNOSIS — J9601 Acute respiratory failure with hypoxia: Secondary | ICD-10-CM | POA: Insufficient documentation

## 2022-07-29 LAB — CBC
HCT: 41.9 % (ref 36.0–46.0)
Hemoglobin: 14.5 g/dL (ref 12.0–15.0)
MCH: 29.1 pg (ref 26.0–34.0)
MCHC: 34.6 g/dL (ref 30.0–36.0)
MCV: 84 fL (ref 80.0–100.0)
Platelets: 265 10*3/uL (ref 150–400)
RBC: 4.99 MIL/uL (ref 3.87–5.11)
RDW: 13.2 % (ref 11.5–15.5)
WBC: 9.2 10*3/uL (ref 4.0–10.5)
nRBC: 0 % (ref 0.0–0.2)

## 2022-07-29 LAB — BASIC METABOLIC PANEL
Anion gap: 9 (ref 5–15)
BUN: 13 mg/dL (ref 8–23)
CO2: 25 mmol/L (ref 22–32)
Calcium: 9.7 mg/dL (ref 8.9–10.3)
Chloride: 105 mmol/L (ref 98–111)
Creatinine, Ser: 0.54 mg/dL (ref 0.44–1.00)
GFR, Estimated: >60 mL/min (ref >60)
Glucose, Bld: 165 mg/dL — ABNORMAL HIGH (ref 70–99)
Potassium: 3.3 mmol/L — ABNORMAL LOW (ref 3.5–5.1)
Sodium: 139 mmol/L (ref 135–145)

## 2022-07-29 LAB — GLUCOSE, CAPILLARY
Glucose-Capillary: 123 mg/dL — ABNORMAL HIGH (ref 70–99)
Glucose-Capillary: 148 mg/dL — ABNORMAL HIGH (ref 70–99)
Glucose-Capillary: 195 mg/dL — ABNORMAL HIGH (ref 70–99)
Glucose-Capillary: 131 mg/dL — ABNORMAL HIGH (ref 70–99)

## 2022-07-29 LAB — HIV ANTIBODY (ROUTINE TESTING W REFLEX): HIV Screen 4th Generation wRfx: NONREACTIVE

## 2022-07-29 MED ORDER — AZITHROMYCIN 250 MG PO TABS
500.0000 mg | ORAL_TABLET | Freq: Every day | ORAL | Status: DC
  Administered 2022-07-29 – 2022-07-31 (×3): 500 mg via ORAL
  Filled 2022-07-29 (×3): qty 2

## 2022-07-29 MED ORDER — POTASSIUM CHLORIDE CRYS ER 20 MEQ PO TBCR
40.0000 meq | EXTENDED_RELEASE_TABLET | Freq: Once | ORAL | Status: AC
  Administered 2022-07-29: 40 meq via ORAL
  Filled 2022-07-29: qty 2

## 2022-07-29 NOTE — Progress Notes (Signed)
  PROGRESS NOTE    Alexandria [name]  [id number] DOB: September 15, 1955 DOA: 07/28/2022 PCP: Gwyneth Sprout, FNP  211A/211A-AA  LOS: 1 day   Brief hospital course:   Assessment & Plan: Alexandria [name] is a 66 y.o. female with medical history significant for COPD, anxiety, depression, diabetes mellitus, GERD, hypertension, recently diagnosed left breast cancer status post lumpectomy with lymph node dissection who presents to the ER for evaluation of worsening shortness of breath over the last 1 week. Patient states that she has had progressively worsening dyspnea with exertion associated with a cough productive of greenish phlegm.  Pt was an active smoker PTA.   * COPD with acute exacerbation ([place]) She failed outpatient treatment --started on IV solumedrol, scheduled nebs, azithromycin and ceftriaxone Plan: --cont steroid --cont scheduled nebs --cont azithromycin --d/c ceftriaxone  Acute hypoxemic respiratory failure ([place]) --She was noted to be hypoxic upon arrival to the ER with room air pulse oximetry in the low 80s and initially required 4 L of oxygen  --treat COPD exacerbation --Continue supplemental O2 to keep sats >=90%, wean as tolerated  Hypokalemia Secondary to diuretic use Supplement potassium Check magnesium levels  Diabetes mellitus ([place]) With hyperglycemia On metformin which will be placed on hold during this hospitalization Expect hyperglycemia since she is on systemic steroids Glycemic control with sliding scale insulin Maintain consistent carbohydrate diet  Breast cancer, left (HCC) Recently diagnosed and status post left breast lumpectomy with lymph node dissection Follow-up with oncology as an outpatient  History of anxiety Continue clonazepam as needed  HTN (hypertension) Continue amlodipine and HCTZ  Tobacco abuse Smoking cessation has been discussed with patient in detail nicotine transdermal patch 21 mg daily   DVT prophylaxis: Lovenox SQ Code Status: Full  code  Family Communication:  Level of care: Telemetry Medical Dispo:   The patient is from: home Anticipated d/c is to: home Anticipated d/c date is: 1-2 days   Subjective and Interval History:  Pt reported breathing improved.   Objective: Vitals:   07/29/22 0558 07/29/22 0829 07/29/22 1525 07/29/22 1542  BP: (!) 150/80 (!) 145/73  (!) 149/70  Pulse: 79 76  80  Resp: '16 18  20  '$ Temp: 98.7 F (37.1 C) 98.1 F (36.7 C)  97.7 F (36.5 C)  TempSrc:  Oral    SpO2: 95% 94% 95% 95%  Weight:      Height:        Intake/Output Summary (Last 24 hours) at 07/29/2022 1859 Last data filed at 07/29/2022 1838 Gross per 24 hour  Intake 970 ml  Output --  Net 970 ml   Filed Weights   07/28/22 1137  Weight: 70.3 kg    Examination:   Constitutional: NAD, AAOx3 HEENT: conjunctivae and lids normal, EOMI CV: No cyanosis.   RESP: normal respiratory effort, no wheezes, on 1L Extremities: No effusions, edema in BLE SKIN: warm, dry Neuro: II - XII grossly intact.   Psych: Normal mood and affect.  Appropriate judgement and reason   Data Reviewed: I have personally reviewed labs and imaging studies  Time spent: 50 minutes  Enzo Bi, MD [facility] If 7PM-7AM, please contact night-coverage 07/29/2022, 6:59 PM

## 2022-07-29 NOTE — Assessment & Plan Note (Addendum)
--  She was noted to be hypoxic upon arrival to the ER with room air pulse oximetry in the low 80s and initially required 4 L of oxygen.  Down to 1L today. --treat COPD exacerbation --Continue supplemental O2 to keep sats >=90%, wean as tolerated

## 2022-07-29 NOTE — Progress Notes (Signed)
Mobility Specialist - Progress Note  Pre-mobility: SpO2 95% During mobility: SpO2 93% Post-mobility: SPO2 92%   07/29/22 1525  Mobility  Activity Ambulated independently in hallway  Level of Assistance Independent  Assistive Device None  Distance Ambulated (ft) 80 ft  Activity Response Tolerated well  Mobility Referral Yes  $Mobility charge 1 Mobility   Pt sitting up in bed upon entry, utilizing 1L [place]. Pt voiced having SOB after most activities, however willing and motivated to participate. Pt completed bed mob, STS and amb indep. Pt ambulated 80 ft in the hallway without AD, tolerated well. Pt O2 remained  >90% throughout the session. Pt returned to room, left sitting up in bed with needs within reach.  Candie Mile Mobility Specialist 07/29/22 3:31 PM

## 2022-07-29 NOTE — Progress Notes (Signed)
Received a call from [name], Ms. Speltz is currently admitted for COPD exacerbation/respiratory infection.   She needs the genetics appt. Rescheduled tomorrow.   Scheduling message sent to get that rescheduled.   I told daughter to keep appt. With Dr. Darrall Dears and Dr. Baruch Gouty on 12/6 for now, but if she is not feeling better or still in the hospital to let me know and we will get it rescheduled.

## 2022-07-30 ENCOUNTER — Inpatient Hospital Stay: Payer: Medicare Other | Admitting: Licensed Clinical Social Worker

## 2022-07-30 ENCOUNTER — Inpatient Hospital Stay: Payer: Medicare Other

## 2022-07-30 LAB — CBC
HCT: 44.2 % (ref 36.0–46.0)
Hemoglobin: 14.8 g/dL (ref 12.0–15.0)
MCH: 28.5 pg (ref 26.0–34.0)
MCHC: 33.5 g/dL (ref 30.0–36.0)
MCV: 85.2 fL (ref 80.0–100.0)
Platelets: 309 10*3/uL (ref 150–400)
RBC: 5.19 MIL/uL — ABNORMAL HIGH (ref 3.87–5.11)
RDW: 13.2 % (ref 11.5–15.5)
WBC: 17.6 10*3/uL — ABNORMAL HIGH (ref 4.0–10.5)
nRBC: 0 % (ref 0.0–0.2)

## 2022-07-30 LAB — BASIC METABOLIC PANEL
Anion gap: 7 (ref 5–15)
BUN: 12 mg/dL (ref 8–23)
CO2: 27 mmol/L (ref 22–32)
Calcium: 10 mg/dL (ref 8.9–10.3)
Chloride: 105 mmol/L (ref 98–111)
Creatinine, Ser: 0.67 mg/dL (ref 0.44–1.00)
GFR, Estimated: >60 mL/min (ref >60)
Glucose, Bld: 113 mg/dL — ABNORMAL HIGH (ref 70–99)
Potassium: 4 mmol/L (ref 3.5–5.1)
Sodium: 139 mmol/L (ref 135–145)

## 2022-07-30 LAB — GLUCOSE, CAPILLARY
Glucose-Capillary: 101 mg/dL — ABNORMAL HIGH (ref 70–99)
Glucose-Capillary: 104 mg/dL — ABNORMAL HIGH (ref 70–99)
Glucose-Capillary: 176 mg/dL — ABNORMAL HIGH (ref 70–99)
Glucose-Capillary: 131 mg/dL — ABNORMAL HIGH (ref 70–99)

## 2022-07-30 LAB — MAGNESIUM: Magnesium: 2.4 mg/dL (ref 1.7–2.4)

## 2022-07-30 NOTE — Progress Notes (Signed)
Mobility Specialist - Progress Note  Pre-mobility: SpO2 97% During mobility: SpO2 91% Post-mobility: SPO2 94%   07/30/22 1054  Mobility  Activity Ambulated independently in hallway  Level of Assistance Independent  Assistive Device None  Distance Ambulated (ft) 80 ft  Activity Response Tolerated well  Mobility Referral Yes  $Mobility charge 1 Mobility   Pt supine upon entry, utilizing 1L [place]. Pt completed bed mob, STS and amb indep. Pt ambulated 80 ft in the hallway, tolerated well. Approximately 40 ft into ambulation Pt vocied felling "a little" SOB, able and willing to ambulate back to room. Pt returned to room, left sitting EOB with needs within reach.   Candie Mile Mobility Specialist 07/30/22 10:59 AM\

## 2022-07-30 NOTE — Progress Notes (Signed)
  PROGRESS NOTE    CIJI BOSTON  DSK:[id number] DOB: 12/10/1955 DOA: 07/28/2022 PCP: Gwyneth Sprout, FNP  211A/211A-AA  LOS: 2 days   Brief hospital course:   Assessment & Plan: Alexandria [name] is a 66 y.o. female with medical history significant for COPD, anxiety, depression, diabetes mellitus, hypertension, recently diagnosed left breast cancer status post lumpectomy with lymph node dissection who presented to the ER for evaluation of worsening shortness of breath over the last 1 week. Patient states that she has had progressively worsening dyspnea with exertion associated with a cough productive of greenish phlegm.  Pt was an active smoker PTA.   * COPD with acute exacerbation ([place]) She failed outpatient treatment --started on IV solumedrol, scheduled nebs, azithromycin and ceftriaxone, ceftriaxone since d/c'ed. Plan: --cont steroid --cont scheduled nebs --cont azithromycin  Acute hypoxemic respiratory failure ([place]) --She was noted to be hypoxic upon arrival to the ER with room air pulse oximetry in the low 80s and initially required 4 L of oxygen.  Down to 1L today. --treat COPD exacerbation --Continue supplemental O2 to keep sats >=90%, wean as tolerated  Hypokalemia Secondary to diuretic use Supplement potassium Check magnesium levels  Diabetes mellitus ([place]) With hyperglycemia On metformin which will be placed on hold during this hospitalization Expect hyperglycemia since she is on systemic steroids Glycemic control with sliding scale insulin Maintain consistent carbohydrate diet  Breast cancer, left (HCC) Recently diagnosed and status post left breast lumpectomy with lymph node dissection Follow-up with oncology as an outpatient  History of anxiety Continue clonazepam as needed  HTN (hypertension) Continue amlodipine and HCTZ  Tobacco abuse Smoking cessation has been discussed with patient in detail nicotine transdermal patch 21 mg daily   DVT prophylaxis:  Lovenox SQ Code Status: Full code  Family Communication:  Level of care: Telemetry Medical Dispo:   The patient is from: home Anticipated d/c is to: home Anticipated d/c date is: tomorrow if weaned down to room air.   Subjective and Interval History:  Pt reported breathing about the same.  Pt felt she should be ready to go home tomorrow.     Objective: Vitals:   07/29/22 2111 07/30/22 0603 07/30/22 0754 07/30/22 1611  BP: (!) 146/75 (!) 145/73 (!) 149/77 (!) 157/74  Pulse: 68 66 72 81  Resp: '20 20 18 18  '$ Temp: 98.3 F (36.8 C) 97.6 F (36.4 C) 97.6 F (36.4 C) 98.2 F (36.8 C)  TempSrc: Oral Oral Oral Oral  SpO2: 94% 97% 93% 93%  Weight:      Height:        Intake/Output Summary (Last 24 hours) at 07/30/2022 2100 Last data filed at 07/30/2022 0206 Gross per 24 hour  Intake 0 ml  Output --  Net 0 ml   Filed Weights   07/28/22 1137  Weight: 70.3 kg    Examination:   Constitutional: NAD, AAOx3 HEENT: conjunctivae and lids normal, EOMI CV: No cyanosis.   RESP: normal respiratory effort, on 1L Extremities: No effusions, edema in BLE SKIN: warm, dry Neuro: II - XII grossly intact.   Psych: Normal mood and affect.  Appropriate judgement and reason   Data Reviewed: I have personally reviewed labs and imaging studies  Time spent: 35 minutes  Enzo Bi, MD [facility] If 7PM-7AM, please contact night-coverage 07/30/2022, 9:00 PM

## 2022-07-31 ENCOUNTER — Encounter: Payer: Medicare Other | Admitting: Surgery

## 2022-07-31 DIAGNOSIS — I10 Essential (primary) hypertension: Secondary | ICD-10-CM

## 2022-07-31 DIAGNOSIS — E876 Hypokalemia: Secondary | ICD-10-CM

## 2022-07-31 LAB — BASIC METABOLIC PANEL
Anion gap: 7 (ref 5–15)
BUN: 16 mg/dL (ref 8–23)
CO2: 26 mmol/L (ref 22–32)
Calcium: 9.5 mg/dL (ref 8.9–10.3)
Chloride: 104 mmol/L (ref 98–111)
Creatinine, Ser: 0.54 mg/dL (ref 0.44–1.00)
GFR, Estimated: >60 mL/min (ref >60)
Glucose, Bld: 92 mg/dL (ref 70–99)
Potassium: 3.3 mmol/L — ABNORMAL LOW (ref 3.5–5.1)
Sodium: 137 mmol/L (ref 135–145)

## 2022-07-31 LAB — CBC
HCT: 43 % (ref 36.0–46.0)
Hemoglobin: 14.3 g/dL (ref 12.0–15.0)
MCH: 28 pg (ref 26.0–34.0)
MCHC: 33.3 g/dL (ref 30.0–36.0)
MCV: 84.1 fL (ref 80.0–100.0)
Platelets: 301 10*3/uL (ref 150–400)
RBC: 5.11 MIL/uL (ref 3.87–5.11)
RDW: 13.4 % (ref 11.5–15.5)
WBC: 14.6 10*3/uL — ABNORMAL HIGH (ref 4.0–10.5)
nRBC: 0 % (ref 0.0–0.2)

## 2022-07-31 LAB — GLUCOSE, CAPILLARY
Glucose-Capillary: 110 mg/dL — ABNORMAL HIGH (ref 70–99)
Glucose-Capillary: 134 mg/dL — ABNORMAL HIGH (ref 70–99)
Glucose-Capillary: 138 mg/dL — ABNORMAL HIGH (ref 70–99)
Glucose-Capillary: 113 mg/dL — ABNORMAL HIGH (ref 70–99)

## 2022-07-31 LAB — MAGNESIUM: Magnesium: 2.4 mg/dL (ref 1.7–2.4)

## 2022-07-31 MED ORDER — POTASSIUM CHLORIDE CRYS ER 20 MEQ PO TBCR
40.0000 meq | EXTENDED_RELEASE_TABLET | Freq: Once | ORAL | Status: AC
  Administered 2022-07-31: 40 meq via ORAL
  Filled 2022-07-31: qty 2

## 2022-07-31 NOTE — Progress Notes (Signed)
Mobility Specialist - Progress Note  Pre-mobility: HR 88 During mobility: HR 102 Post-mobility: HR 94   07/31/22 1114  Mobility  Activity Ambulated independently in hallway  Level of Assistance Independent  Assistive Device None  Distance Ambulated (ft) 180 ft  Activity Response Tolerated well  Mobility Referral Yes  $Mobility charge 1 Mobility   SaO2 on room air at rest = 95% SaO2 on room air while ambulating = 94-95% SaO2 on ?L while ambulating = N/A  Pt sitting in recliner upon entry, utilizing 1L [place]. MS removed pt from [place] for O2 sat test. While sitting on RA, Pt O2 >90% with HR of 88. Pt ambulated one lap around NS, O2 remaining >90% with HR of 102. During ambulation Pt experienced SOB, stated that "it isn't much" and is able to continue with ambulation. Pt returned to room, left sitting in the recliner utilizing 1L [place].   Candie Mile Mobility Specialist 07/31/22 11:31 AM

## 2022-07-31 NOTE — Progress Notes (Signed)
PROGRESS NOTE    Alexandria [name]  DVV:[id number] DOB: Jan 04, 1956 DOA: 07/28/2022 PCP: Gwyneth Sprout, FNP   Assessment & Plan:   Principal Problem:   COPD with acute exacerbation ([place]) Active Problems:   Tobacco abuse   HTN (hypertension)   History of anxiety   Breast cancer, left ([place])   Diabetes mellitus ([place])   Hypokalemia   Acute hypoxemic respiratory failure ([place])  Assessment and Plan: COPD exacerbation: failed outpatient treatment. Continue on azithromycin, steroids, bronchodilators & encourage incentive spirometry. Still w/ significant shortness of breath w/ exertion    Acute hypoxic respiratory failure: was noted to be hypoxic upon arrival to the ER with room air pulse oximetry in the low 80s and initially required 4 L of oxygen. Weaned off of supplemental oxygen    Hypokalemia: potassium given   DM2: likely well controlled. Holding metformin. Continue on SSI w/ accuchecks   Left breast cancer: recently diagnosed and s/p left breast lumpectomy with lymph node dissection. Management per onco outpatient    Hx of anxiety: severity unknown. Continue on clonazepam prn    HTN: continue on amlodipine, HCTZ   Tobacco abuse: received smoking cessation counseling. Nicotine patch to prevent w/drawl       DVT prophylaxis: lovenox  Code Status: full  Family Communication:  Disposition Plan: likely d/c back home   Level of care: Telemetry Medical  Status is: Inpatient Remains inpatient appropriate because: still very short of breath w/ exertion     Consultants:    Procedures:   Antimicrobials: azithromycin   Subjective: Pt c/o shortness of breath w/ walking   Objective: Vitals:   07/30/22 0754 07/30/22 1611 07/30/22 2112 07/31/22 0508  BP: (!) 149/77 (!) 157/74 132/68 (!) 147/80  Pulse: 72 81 67 75  Resp: '18 18 20 18  '$ Temp: 97.6 F (36.4 C) 98.2 F (36.8 C) 97.9 F (36.6 C) 98.2 F (36.8 C)  TempSrc: Oral Oral Oral Oral  SpO2: 93% 93% 97% 96%  Weight:       Height:        Intake/Output Summary (Last 24 hours) at 07/31/2022 0825 Last data filed at 07/30/2022 2151 Gross per 24 hour  Intake 240 ml  Output --  Net 240 ml   Filed Weights   07/28/22 1137  Weight: 70.3 kg    Examination:  General exam: Appears calm and comfortable  Respiratory system: decreased breath sounds b/l  Cardiovascular system: S1 & S2+. No rubs, gallops or clicks. Gastrointestinal system: Abdomen is nondistended, soft and nontender. Normal bowel sounds heard. Central nervous system: Alert and oriented. Moves all extremities  Psychiatry: Judgement and insight appear normal. Mood & affect appropriate.     Data Reviewed: I have personally reviewed following labs and imaging studies  CBC: Recent Labs  Lab 07/28/22 1140 07/29/22 0435 07/30/22 0449 07/31/22 0549  WBC 14.7* 9.2 17.6* 14.6*  NEUTROABS 9.3*  --   --   --   HGB 16.7* 14.5 14.8 14.3  HCT 49.5* 41.9 44.2 43.0  MCV 85.3 84.0 85.2 84.1  PLT 285 265 309 626   Basic Metabolic Panel: Recent Labs  Lab 07/28/22 1140 07/29/22 0435 07/30/22 0449 07/31/22 0549  NA 137 139 139 137  K 3.1* 3.3* 4.0 3.3*  CL 98 105 105 104  CO2 '28 25 27 26  '$ GLUCOSE 111* 165* 113* 92  BUN '14 13 12 16  '$ CREATININE 0.71 0.54 0.67 0.54  CALCIUM 10.0 9.7 10.0 9.5  MG 2.1  --  2.4 2.4   GFR: Estimated Creatinine Clearance: 66.4 mL/min (by C-G formula based on SCr of 0.54 mg/dL). Liver Function Tests: Recent Labs  Lab 07/28/22 1140  AST 21  ALT 21  ALKPHOS 96  BILITOT 0.9  PROT 7.3  ALBUMIN 3.9   No results for input(s): "LIPASE", "AMYLASE" in the last 168 hours. No results for input(s): "AMMONIA" in the last 168 hours. Coagulation Profile: No results for input(s): "INR", "PROTIME" in the last 168 hours. Cardiac Enzymes: No results for input(s): "CKTOTAL", "CKMB", "CKMBINDEX", "TROPONINI" in the last 168 hours. BNP (last 3 results) No results for input(s): "PROBNP" in the last 8760 hours. HbA1C: No  results for input(s): "HGBA1C" in the last 72 hours. CBG: Recent Labs  Lab 07/29/22 2046 07/30/22 0755 07/30/22 1140 07/30/22 1613 07/30/22 2135  GLUCAP 131* 101* 104* 176* 131*   Lipid Profile: No results for input(s): "CHOL", "HDL", "LDLCALC", "TRIG", "CHOLHDL", "LDLDIRECT" in the last 72 hours. Thyroid Function Tests: No results for input(s): "TSH", "T4TOTAL", "FREET4", "T3FREE", "THYROIDAB" in the last 72 hours. Anemia Panel: No results for input(s): "VITAMINB12", "FOLATE", "FERRITIN", "TIBC", "IRON", "RETICCTPCT" in the last 72 hours. Sepsis Labs: No results for input(s): "PROCALCITON", "LATICACIDVEN" in the last 168 hours.  Recent Results (from the past 240 hour(s))  Resp panel by RT-PCR (RSV, Flu A&B, Covid) Anterior Nasal Swab     Status: None   Collection Time: 07/24/22  9:06 AM   Specimen: Anterior Nasal Swab  Result Value Ref Range Status   SARS Coronavirus 2 by RT PCR NEGATIVE NEGATIVE Final    Comment: (NOTE) SARS-CoV-2 target nucleic acids are NOT DETECTED.  The SARS-CoV-2 RNA is generally detectable in upper respiratory specimens during the acute phase of infection. The lowest concentration of SARS-CoV-2 viral copies this assay can detect is 138 copies/mL. A negative result does not preclude SARS-Cov-2 infection and should not be used as the sole basis for treatment or other patient management decisions. A negative result may occur with  improper specimen collection/handling, submission of specimen other than nasopharyngeal swab, presence of viral mutation(s) within the areas targeted by this assay, and inadequate number of viral copies(<138 copies/mL). A negative result must be combined with clinical observations, patient history, and epidemiological information. The expected result is Negative.  Fact Sheet for Patients:  EntrepreneurPulse.com.au  Fact Sheet for Healthcare Providers:  IncredibleEmployment.be  This test  is no t yet approved or cleared by the [place] FDA and  has been authorized for detection and/or diagnosis of SARS-CoV-2 by FDA under an Emergency Use Authorization (EUA). This EUA will remain  in effect (meaning this test can be used) for the duration of the COVID-19 declaration under Section 564(b)(1) of the Act, 21 U.S.C.section 360bbb-3(b)(1), unless the authorization is terminated  or revoked sooner.       Influenza A by PCR NEGATIVE NEGATIVE Final   Influenza B by PCR NEGATIVE NEGATIVE Final    Comment: (NOTE) The Xpert Xpress SARS-CoV-2/FLU/RSV plus assay is intended as an aid in the diagnosis of influenza from Nasopharyngeal swab specimens and should not be used as a sole basis for treatment. Nasal washings and aspirates are unacceptable for Xpert Xpress SARS-CoV-2/FLU/RSV testing.  Fact Sheet for Patients: EntrepreneurPulse.com.au  Fact Sheet for Healthcare Providers: IncredibleEmployment.be  This test is not yet approved or cleared by the [place] FDA and has been authorized for detection and/or diagnosis of SARS-CoV-2 by FDA under an Emergency Use Authorization (EUA). This EUA will remain in effect (meaning this test  can be used) for the duration of the COVID-19 declaration under Section 564(b)(1) of the Act, 21 U.S.C. section 360bbb-3(b)(1), unless the authorization is terminated or revoked.     Resp Syncytial Virus by PCR NEGATIVE NEGATIVE Final    Comment: (NOTE) Fact Sheet for Patients: EntrepreneurPulse.com.au  Fact Sheet for Healthcare Providers: IncredibleEmployment.be  This test is not yet approved or cleared by the [place] FDA and has been authorized for detection and/or diagnosis of SARS-CoV-2 by FDA under an Emergency Use Authorization (EUA). This EUA will remain in effect (meaning this test can be used) for the duration of the COVID-19 declaration under Section  564(b)(1) of the Act, 21 U.S.C. section 360bbb-3(b)(1), unless the authorization is terminated or revoked.  Performed at [facility], [address] [place], [place] [address]   Resp Panel by RT-PCR (Flu A&B, Covid) Anterior Nasal Swab     Status: None   Collection Time: 07/28/22 11:40 AM   Specimen: Anterior Nasal Swab  Result Value Ref Range Status   SARS Coronavirus 2 by RT PCR NEGATIVE NEGATIVE Final    Comment: (NOTE) SARS-CoV-2 target nucleic acids are NOT DETECTED.  The SARS-CoV-2 RNA is generally detectable in upper respiratory specimens during the acute phase of infection. The lowest concentration of SARS-CoV-2 viral copies this assay can detect is 138 copies/mL. A negative result does not preclude SARS-Cov-2 infection and should not be used as the sole basis for treatment or other patient management decisions. A negative result may occur with  improper specimen collection/handling, submission of specimen other than nasopharyngeal swab, presence of viral mutation(s) within the areas targeted by this assay, and inadequate number of viral copies(<138 copies/mL). A negative result must be combined with clinical observations, patient history, and epidemiological information. The expected result is Negative.  Fact Sheet for Patients:  EntrepreneurPulse.com.au  Fact Sheet for Healthcare Providers:  IncredibleEmployment.be  This test is no t yet approved or cleared by the [place] FDA and  has been authorized for detection and/or diagnosis of SARS-CoV-2 by FDA under an Emergency Use Authorization (EUA). This EUA will remain  in effect (meaning this test can be used) for the duration of the COVID-19 declaration under Section 564(b)(1) of the Act, 21 U.S.C.section 360bbb-3(b)(1), unless the authorization is terminated  or revoked sooner.       Influenza A by PCR NEGATIVE NEGATIVE Final   Influenza B by PCR NEGATIVE  NEGATIVE Final    Comment: (NOTE) The Xpert Xpress SARS-CoV-2/FLU/RSV plus assay is intended as an aid in the diagnosis of influenza from Nasopharyngeal swab specimens and should not be used as a sole basis for treatment. Nasal washings and aspirates are unacceptable for Xpert Xpress SARS-CoV-2/FLU/RSV testing.  Fact Sheet for Patients: EntrepreneurPulse.com.au  Fact Sheet for Healthcare Providers: IncredibleEmployment.be  This test is not yet approved or cleared by the [place] FDA and has been authorized for detection and/or diagnosis of SARS-CoV-2 by FDA under an Emergency Use Authorization (EUA). This EUA will remain in effect (meaning this test can be used) for the duration of the COVID-19 declaration under Section 564(b)(1) of the Act, 21 U.S.C. section 360bbb-3(b)(1), unless the authorization is terminated or revoked.  Performed at [facility], [address] [place], [place] [address]   Blood culture (routine x 2)     Status: None (Preliminary result)   Collection Time: 07/28/22  4:08 PM   Specimen: BLOOD RIGHT ARM  Result Value Ref Range Status   Specimen Description BLOOD RIGHT  ARM  Final   Special Requests   Final    BOTTLES DRAWN AEROBIC AND ANAEROBIC Blood Culture adequate volume   Culture   Final    NO GROWTH 3 DAYS Performed at [facility], [address] [place], [place] [address]    Report Status PENDING  Incomplete  Blood culture (routine x 2)     Status: None (Preliminary result)   Collection Time: 07/28/22  4:08 PM   Specimen: BLOOD LEFT ARM  Result Value Ref Range Status   Specimen Description BLOOD LEFT ARM  Final   Special Requests   Final    BOTTLES DRAWN AEROBIC AND ANAEROBIC Blood Culture results may not be optimal due to an inadequate volume of blood received in culture bottles   Culture   Final    NO GROWTH 3 DAYS Performed at [facility], [address]  [place], [place] [address]    Report Status PENDING  Incomplete         Radiology Studies: No results found.      Scheduled Meds:  amLODipine  10 mg Oral Daily   azithromycin  500 mg Oral q1800   benzonatate  100 mg Oral TID   enoxaparin (LOVENOX) injection  40 mg Subcutaneous Q24H   famotidine  20 mg Oral Daily   fluticasone furoate-vilanterol  1 puff Inhalation Daily   And   umeclidinium bromide  1 puff Inhalation Daily   hydrochlorothiazide  25 mg Oral Daily   insulin aspart  0-15 Units Subcutaneous TID WC   multivitamin with minerals  1 tablet Oral Daily   nicotine  21 mg Transdermal Daily   predniSONE  40 mg Oral Q breakfast   rosuvastatin  40 mg Oral QHS   Continuous Infusions:   LOS: 3 days    Time spent: 30 mins     Wyvonnia Dusky, MD [facility] Pager [phone]  If 7PM-7AM, please contact night-coverage [email or web address] 07/31/2022, 8:25 AM

## 2022-07-31 NOTE — Care Management Important Message (Signed)
Important Message  Patient Details  Name: Alexandria [name] MRN: [id number] Date of Birth: 1956/03/23   Medicare Important Message Given:  Yes     Dannette Barbara 07/31/2022, 12:21 PM

## 2022-07-31 NOTE — Progress Notes (Addendum)
SATURATION QUALIFICATIONS:  Patient Saturations on Room Air at Rest = 95%  Patient Saturations on Room Air while Ambulating = 94%  Respirations with ambulation were 30. Returned to 22 after resting in recliner for several minutes.

## 2022-07-31 NOTE — Plan of Care (Signed)
  Problem: Education: Goal: Knowledge of the prescribed therapeutic regimen will improve Outcome: Progressing   

## 2022-07-31 NOTE — TOC Progression Note (Signed)
Transition of Care [facility]) - Progression Note    Patient Details  Name: Alexandria [name] MRN: [id number] Date of Birth: 21-Mar-1956  [facility] CM/SW Contact  Beverly Sessions, RN Phone Number: 07/31/2022, 11:29 AM  Clinical Narrative:    Per RN note patient has been weaned to RA with ambulation         Expected Discharge Plan and Services                                                 Social Determinants of Health (SDOH) Interventions    Readmission Risk Interventions     No data to display

## 2022-08-01 DIAGNOSIS — F419 Anxiety disorder, unspecified: Secondary | ICD-10-CM

## 2022-08-01 LAB — BASIC METABOLIC PANEL
Anion gap: 7 (ref 5–15)
BUN: 16 mg/dL (ref 8–23)
CO2: 24 mmol/L (ref 22–32)
Calcium: 9.7 mg/dL (ref 8.9–10.3)
Chloride: 108 mmol/L (ref 98–111)
Creatinine, Ser: 0.55 mg/dL (ref 0.44–1.00)
GFR, Estimated: >60 mL/min (ref >60)
Glucose, Bld: 98 mg/dL (ref 70–99)
Potassium: 3.7 mmol/L (ref 3.5–5.1)
Sodium: 139 mmol/L (ref 135–145)

## 2022-08-01 LAB — CBC
HCT: 43.3 % (ref 36.0–46.0)
Hemoglobin: 14.6 g/dL (ref 12.0–15.0)
MCH: 28.6 pg (ref 26.0–34.0)
MCHC: 33.7 g/dL (ref 30.0–36.0)
MCV: 84.9 fL (ref 80.0–100.0)
Platelets: 293 10*3/uL (ref 150–400)
RBC: 5.1 MIL/uL (ref 3.87–5.11)
RDW: 13.3 % (ref 11.5–15.5)
WBC: 13.2 10*3/uL — ABNORMAL HIGH (ref 4.0–10.5)
nRBC: 0 % (ref 0.0–0.2)

## 2022-08-01 LAB — MAGNESIUM: Magnesium: 2.4 mg/dL (ref 1.7–2.4)

## 2022-08-01 LAB — GLUCOSE, CAPILLARY
Glucose-Capillary: 92 mg/dL (ref 70–99)
Glucose-Capillary: 196 mg/dL — ABNORMAL HIGH (ref 70–99)

## 2022-08-01 MED ORDER — AZITHROMYCIN 250 MG PO TABS: 250.0000 mg | ORAL_TABLET | Freq: Every day | ORAL | 0 refills | Status: AC

## 2022-08-01 MED ORDER — PREDNISONE 20 MG PO TABS: 40.0000 mg | ORAL_TABLET | Freq: Every day | ORAL | 0 refills | Status: AC

## 2022-08-01 NOTE — Progress Notes (Signed)
Pt d/c home. IV removed no complications noted. Dc instructions provided. All questions and concerns addressed at this time.

## 2022-08-01 NOTE — Discharge Summary (Signed)
Physician Discharge Summary  HESTER JOSLIN MLJ:[id number] DOB: 10-Mar-1956 DOA: 07/28/2022  PCP: Gwyneth Sprout, FNP  Admit date: 07/28/2022 Discharge date: 08/01/2022  Admitted From: home Disposition:  home   Recommendations for Outpatient Follow-up:  Follow up with PCP in 1-2 weeks   Home Health: no  Equipment/Devices:  Discharge Condition: stable CODE STATUS: full  Diet recommendation: Heart Healthy / Carb Modified   Brief/Interim Summary: HPI was taken from Dr. Francine Graven: Quenten Raven Gomer is a 66 y.o. female with medical history significant for COPD, anxiety, depression, diabetes mellitus, GERD, hypertension, recently diagnosed left breast cancer status post lumpectomy with lymph node dissection who presents to the ER for evaluation of worsening shortness of breath over the last 1 week. Patient states that she has had progressively worsening dyspnea with exertion associated with a cough productive of greenish phlegm.  She denies having any fever or chills.  She had a virtual visit with her primary care provider and was prescribed doxycycline and then 2 days later she went to the urgent care center where she was prescribed.  She has not had any relief from her symptoms despite taking the medications as prescribed.  She has had nausea, vomiting and diarrhea related to the antibiotic use.  She states that at the urgent care she had pulse oximetry of 88% on room air and they recommended that she use her bronchodilator with a spacer she has used as recommended. She denies having any chest pain, no abdominal pain, no urinary symptoms, no headache, no dizziness, no lightheadedness, no leg swelling, no urinary symptoms, no changes in bowel habits, no blurred vision or focal deficit. She was noted to be hypoxic upon arrival to the ER with room air pulse oximetry in the low 80s and initially required 4 L of oxygen by nasal cannula but has been weaned down to 2 L She will be admitted to the hospital for acute  COPD exacerbation.   As per Dr. Billie Ruddy: Quenten Raven Sailors is a 66 y.o. female with medical history significant for COPD, anxiety, depression, diabetes mellitus, hypertension, recently diagnosed left breast cancer status post lumpectomy with lymph node dissection who presented to the ER for evaluation of worsening shortness of breath over the last 1 week. Patient states that she has had progressively worsening dyspnea with exertion associated with a cough productive of greenish phlegm.  Pt was an active smoker PTA.  As per Dr. Jimmye Norman 11/29-11/30/23: Pt had significant shortness of breath w/ ambulating on 11/29 so d/c was held that day. However, pt's SaO2 level was in the 90s on RA throughout ambulation. The following day, pt ambulated w/ the pt's nurse after receiving her morning meds and there was minimal shortness of breath. For more information, please see previous progress notes.   Discharge Diagnoses:  Principal Problem:   COPD with acute exacerbation ([place]) Active Problems:   Tobacco abuse   HTN (hypertension)   History of anxiety   Breast cancer, left (HCC)   Diabetes mellitus ([place])   Hypokalemia   Acute hypoxemic respiratory failure (HCC)  COPD exacerbation: failed outpatient treatment. Continue on azithromycin, steroids, bronchodilators & encourage incentive spirometry. Minimal shortness of breath w/ ambulation today    Acute hypoxic respiratory failure: was noted to be hypoxic upon arrival to the ER with room air pulse oximetry in the low 80s and initially required 4 L of oxygen. Weaned off of supplemental oxygen. Resolved    Hypokalemia: WNL today    DM2: likely well controlled. Restart  home dose of metformin at d/c  Left breast cancer: recently diagnosed and s/p left breast lumpectomy with lymph node dissection. Management per onco outpatient    Hx of anxiety: severity unknown. Continue on home dose clonazepam prn    HTN: continue on amlodipine, HCTZ   Tobacco abuse: received  smoking cessation counseling. Nicotine patch to prevent w/drawl   Discharge Instructions  Discharge Instructions     Diet - low sodium heart healthy   Complete by: As directed    Diet Carb Modified   Complete by: As directed    Discharge instructions   Complete by: As directed    F/u w/ PCP in 1-2 weeks   Increase activity slowly   Complete by: As directed    No wound care   Complete by: As directed       Allergies as of 08/01/2022       Reactions   Pravastatin Sodium    Myalgia        Medication List     STOP taking these medications    amoxicillin-clavulanate 875-125 MG tablet Commonly known as: AUGMENTIN   doxycycline 100 MG tablet Commonly known as: VIBRA-TABS       TAKE these medications    acetaminophen 500 MG tablet Commonly known as: TYLENOL Take 1,000 mg by mouth every 6 (six) hours as needed for mild pain. What changed: Another medication with the same name was removed. Continue taking this medication, and follow the directions you see here.   AeroChamber MV inhaler Use as instructed   albuterol 108 (90 Base) MCG/ACT inhaler Commonly known as: VENTOLIN HFA Inhale 1-2 puffs into the lungs every 4 (four) hours as needed for wheezing or shortness of breath. What changed: Another medication with the same name was removed. Continue taking this medication, and follow the directions you see here.   amLODipine 10 MG tablet Commonly known as: NORVASC Take 1 tablet (10 mg total) by mouth daily.   azithromycin 250 MG tablet Commonly known as: ZITHROMAX Take 1 tablet (250 mg total) by mouth daily for 1 day.   benzonatate 100 MG capsule Commonly known as: TESSALON Take 1 capsule (100 mg total) by mouth 3 (three) times daily as needed.   cetirizine 10 MG tablet Commonly known as: ZYRTEC Take 1 tablet (10 mg total) by mouth daily.   clonazePAM 0.5 MG tablet Commonly known as: KLONOPIN TAKE ONE-HALF TABLET BY MOUTH 3  TIMES DAILY AS NEEDED FOR   ANXIETY What changed: See the new instructions.   famotidine 20 MG tablet Commonly known as: PEPCID Take 20 mg by mouth as needed for heartburn.   hydrochlorothiazide 25 MG tablet Commonly known as: HYDRODIURIL TAKE 1 TABLET BY MOUTH DAILY   ibuprofen 600 MG tablet Commonly known as: ADVIL Take 1 tablet (600 mg total) by mouth every 8 (eight) hours as needed for moderate pain.   loperamide 2 MG capsule Commonly known as: IMODIUM TAKE 1 CAPSULE BY MOUTH 4 TIMES  DAILY AS NEEDED FOR DIARRHEA OR  LOOSE STOOLS What changed: See the new instructions.   metFORMIN 750 MG 24 hr tablet Commonly known as: GLUCOPHAGE-XR Take 750 mg by mouth daily with supper.   MULTIPLE VITAMIN PO Take 1 tablet by mouth daily. Reported on 03/13/2016   ondansetron 4 MG disintegrating tablet Commonly known as: ZOFRAN-ODT Take 1 tablet (4 mg total) by mouth every 8 (eight) hours as needed for nausea or vomiting.   oxyCODONE 5 MG immediate release tablet Commonly known  as: Oxy IR/ROXICODONE Take 1 tablet (5 mg total) by mouth every 4 (four) hours as needed for severe pain.   predniSONE 20 MG tablet Commonly known as: DELTASONE Take 2 tablets (40 mg total) by mouth daily with breakfast for 2 days. Start taking on: August 02, 2022   rosuvastatin 40 MG tablet Commonly known as: CRESTOR Take 1 tablet (40 mg total) by mouth daily.   Trelegy Ellipta 100-62.5-25 MCG/ACT Aepb Generic drug: Fluticasone-Umeclidin-Vilant USE 1 INHALATION BY MOUTH DAILY        Allergies  Allergen Reactions   Pravastatin Sodium     Myalgia    Consultations:    Procedures/Studies: CT Angio Chest PE W and/or Wo Contrast  Result Date: 07/28/2022 CLINICAL DATA:  Shortness of breath for 1 week. Clinical concern for pulmonary embolus. Status post left breast lumpectomy on 07/17/2022 EXAM: CT ANGIOGRAPHY CHEST WITH CONTRAST TECHNIQUE: Multidetector CT imaging of the chest was performed using the standard protocol  during bolus administration of intravenous contrast. Multiplanar CT image reconstructions and MIPs were obtained to evaluate the vascular anatomy. RADIATION DOSE REDUCTION: This exam was performed according to the departmental dose-optimization program which includes automated exposure control, adjustment of the mA and/or kV according to patient size and/or use of iterative reconstruction technique. CONTRAST:  73m OMNIPAQUE IOHEXOL 350 MG/ML SOLN COMPARISON:  None Available. FINDINGS: Cardiovascular: The heart size is normal. No substantial pericardial effusion. Coronary artery calcification is evident. Mild atherosclerotic calcification is noted in the wall of the thoracic aorta. There is no filling defect within the opacified pulmonary arteries to suggest the presence of an acute pulmonary embolus. Mediastinum/Nodes: No mediastinal lymphadenopathy. 10 mm short axis right hilar lymph node is upper normal for size. No left hilar lymphadenopathy. The esophagus has normal imaging features. There is no axillary lymphadenopathy. Lungs/Pleura: Centrilobular and paraseptal emphysema evident. Circumferential bronchial wall thickening noted with basilar predominant micro nodularity, right greater than left. 3 mm right middle lobe nodule on 84/5 is 1 of the more dominant nodules in either lung base. No focal airspace consolidation. No pleural effusion. Upper Abdomen: High attenuation material noted in the gallbladder lumen, incompletely visualized. No history of recent contrast infused study to suggest vicarious excretion of contrast. This may represent dense gallbladder sludge. Musculoskeletal: No worrisome lytic or sclerotic osseous abnormality. Gas is identified in the soft tissues of the left breast including a 2.8 x 1.9 cm collection of gas and fluid in the medial left breast on image 65/4. Review of the MIP images confirms the above findings. IMPRESSION: 1. No CT evidence for acute pulmonary embolus. 2.  Circumferential bronchial wall thickening with basilar predominant micro nodularity, right greater than left. Imaging features are compatible with an infectious/inflammatory etiology. Atypical infection should be considered. Given the history of breast cancer, metastatic disease cannot be completely excluded but is considered less likely. 3. Gas in the soft tissues of the left breast including a 2.8 x 1.9 cm collection of gas and fluid in the medial left breast. Soft tissue gas is not typically expected more than 7-10 days after surgery so this finding is at the outer limits of expected normal postoperative appearance. 4. 3 mm right middle lobe pulmonary nodule. Likely related to the above described process. Follow-up recommended to ensure resolution. 5. High attenuation material in the gallbladder lumen, incompletely visualized. No history of recent contrast infused study to suggest vicarious excretion of contrast. This may represent dense gallbladder sludge although attenuation is higher than typically seen in the setting of  sludge. Right upper quadrant ultrasound could be used to further evaluate as clinically warranted. 6. Aortic Atherosclerosis (ICD10-I70.0) and Emphysema (ICD10-J43.9). Electronically Signed   By: Misty Stanley M.D.   On: 07/28/2022 15:14   DG Chest 2 View  Result Date: 07/28/2022 CLINICAL DATA:  Shortness of breath. EXAM: CHEST - 2 VIEW COMPARISON:  July 24, 2022. FINDINGS: The heart size and mediastinal contours are within normal limits. Both lungs are clear. Hyperexpansion of the lungs is again noted. The visualized skeletal structures are unremarkable. IMPRESSION: No active cardiopulmonary disease. Electronically Signed   By: Marijo Conception M.D.   On: 07/28/2022 12:00   DG Chest 2 View  Result Date: 07/24/2022 CLINICAL DATA:  Cough, shortness of breath, and hypoxia. Status post left breast lumpectomy and resection of lymph node within the left upper outer breast adjacent  axillary region 1 week ago for breast cancer. EXAM: CHEST - 2 VIEW COMPARISON:  Chest two views 12/11/2019 FINDINGS: Cardiac silhouette and mediastinal contours are within normal limits. Mild calcification within aortic arch. Flattening of the diaphragms and mild-to-moderate hyperinflation, unchanged. Increased lucencies within the bilateral lungs suggesting emphysematous changes. No acute airspace opacity. No pleural effusion or pneumothorax. Mild-to-moderate multilevel degenerative disc changes of the thoracic spine. IMPRESSION: 1. No acute cardiopulmonary process. 2. Mild-to-moderate hyperinflation and findings compatible with emphysematous changes, unchanged from prior. Electronically Signed   By: Yvonne Kendall M.D.   On: 07/24/2022 09:49   MM Breast Surgical Specimen  Result Date: 07/17/2022 CLINICAL DATA:  Status post RF ID tag LEFT breast lumpectomy biopsy-proven invasive mammary carcinoma at the COIL clip. EXAM: SPECIMEN RADIOGRAPH OF THE LEFT BREAST COMPARISON:  Previous exam(s). FINDINGS: Status post excision of the LEFT breast. The RF ID tag and COIL shaped clip are present within the specimen. IMPRESSION: Specimen radiograph of the LEFT breast. Electronically Signed   By: Valentino Saxon M.D.   On: 07/17/2022 15:31  NM Sentinel Node Inj-No Rpt (Breast)  Result Date: 07/17/2022 Sulfur Colloid was injected by the Nuclear Medicine Technologist for sentinel lymph node localization.   MM LT RADIO FREQUENCY TAG LOC MAMMO GUIDE  Result Date: 07/11/2022 CLINICAL DATA:  Status post stereotactic guided biopsy demonstrating invasive mammary carcinoma. (COIL clip) EXAM: MAMMOGRAPHIC GUIDED RADIOFREQUENCY DEVICE LOCALIZATION OF THE LEFT BREAST COMPARISON:  Previous exam(s). FINDINGS: Patient presents for radiofrequency device localization prior to lumpectomy. I met with the patient and we discussed the procedure of radiofrequency device localization including benefits and alternatives. We discussed  the high likelihood of a successful procedure. We discussed the risks of the procedure including infection, bleeding, tissue injury and further surgery. Informed, written consent was given. The usual time-out protocol was performed immediately prior to the procedure. Using mammographic guidance, sterile technique, 1% lidocaine as local anesthesia, a radiofrequency ID tag ([id number]) was used to localize the COIL clip using a lateral approach. The follow-up mammogram images confirm that the RF device is in the expected location and are marked for Dr. Hampton Abbot. RF ID tag is located approximately 4 mm posterior to the COIL clip along the posterior margin of the biopsy changes The patient tolerated the procedure well and was released from the [facility]. IMPRESSION: Radiofrequency device localization of the LEFT breast. No apparent complications. Electronically Signed   By: Valentino Saxon M.D.   On: 07/11/2022 16:13  (Echo, Carotid, EGD, Colonoscopy, ERCP)    Subjective: Pt denies any complaints    Discharge Exam: Vitals:   08/01/22 0323 08/01/22 0712  BP: 134/77 126/71  Pulse: 82 80  Resp: 20 18  Temp: 99.1 F (37.3 C) 98.4 F (36.9 C)  SpO2: 95% 92%   Vitals:   07/31/22 1526 07/31/22 2112 08/01/22 0323 08/01/22 0712  BP: (!) 142/70 134/70 134/77 126/71  Pulse: 84 73 82 80  Resp: _0 Temp: 98.6 F (37 C) 97.8 F (36.6 C) 99.1 F (37.3 C) 98.4 F (36.9 C)  TempSrc: Oral Oral Oral Oral  SpO2: 93% 93% 95% 92%  Weight:      Height:        General: Pt is alert, awake, not in acute distress Cardiovascular: S1/S2 +, no rubs, no gallops Respiratory: decreased breath sounds b/l Abdominal: Soft, NT, ND, bowel sounds + Extremities: no edema, no cyanosis    The results of significant diagnostics from this hospitalization (including imaging, microbiology, ancillary and laboratory) are listed below for reference.     Microbiology: Recent Results (from the past 240 hour(s))   Resp panel by RT-PCR (RSV, Flu A&B, Covid) Anterior Nasal Swab     Status: None   Collection Time: 07/24/22  9:06 AM   Specimen: Anterior Nasal Swab  Result Value Ref Range Status   SARS Coronavirus 2 by RT PCR NEGATIVE NEGATIVE Final    Comment: (NOTE) SARS-CoV-2 target nucleic acids are NOT DETECTED.  The SARS-CoV-2 RNA is generally detectable in upper respiratory specimens during the acute phase of infection. The lowest concentration of SARS-CoV-2 viral copies this assay can detect is 138 copies/mL. A negative result does not preclude SARS-Cov-2 infection and should not be used as the sole basis for treatment or other patient management decisions. A negative result may occur with  improper specimen collection/handling, submission of specimen other than nasopharyngeal swab, presence of viral mutation(s) within the areas targeted by this assay, and inadequate number of viral copies(<138 copies/mL). A negative result must be combined with clinical observations, patient history, and epidemiological information. The expected result is Negative.  Fact Sheet for Patients:  EntrepreneurPulse.com.au  Fact Sheet for Healthcare Providers:  IncredibleEmployment.be  This test is no t yet approved or cleared by the [place] FDA and  has been authorized for detection and/or diagnosis of SARS-CoV-2 by FDA under an Emergency Use Authorization (EUA). This EUA will remain  in effect (meaning this test can be used) for the duration of the COVID-19 declaration under Section 564(b)(1) of the Act, 21 U.S.C.section 360bbb-3(b)(1), unless the authorization is terminated  or revoked sooner.       Influenza A by PCR NEGATIVE NEGATIVE Final   Influenza B by PCR NEGATIVE NEGATIVE Final    Comment: (NOTE) The Xpert Xpress SARS-CoV-2/FLU/RSV plus assay is intended as an aid in the diagnosis of influenza from Nasopharyngeal swab specimens and should not be used  as a sole basis for treatment. Nasal washings and aspirates are unacceptable for Xpert Xpress SARS-CoV-2/FLU/RSV testing.  Fact Sheet for Patients: EntrepreneurPulse.com.au  Fact Sheet for Healthcare Providers: IncredibleEmployment.be  This test is not yet approved or cleared by the [place] FDA and has been authorized for detection and/or diagnosis of SARS-CoV-2 by FDA under an Emergency Use Authorization (EUA). This EUA will remain in effect (meaning this test can be used) for the duration of the COVID-19 declaration under Section 564(b)(1) of the Act, 21 U.S.C. section 360bbb-3(b)(1), unless the authorization is terminated or revoked.     Resp Syncytial Virus by PCR NEGATIVE NEGATIVE Final    Comment: (NOTE) Fact Sheet for Patients: EntrepreneurPulse.com.au  Fact Sheet for  Healthcare Providers: IncredibleEmployment.be  This test is not yet approved or cleared by the [place] and has been authorized for detection and/or diagnosis of SARS-CoV-2 by FDA under an Emergency Use Authorization (EUA). This EUA will remain in effect (meaning this test can be used) for the duration of the COVID-19 declaration under Section 564(b)(1) of the Act, 21 U.S.C. section 360bbb-3(b)(1), unless the authorization is terminated or revoked.  Performed at [facility], [address], [place], [place] [address]   Resp Panel by RT-PCR (Flu A&B, Covid) Anterior Nasal Swab     Status: None   Collection Time: 07/28/22 11:40 AM   Specimen: Anterior Nasal Swab  Result Value Ref Range Status   SARS Coronavirus 2 by RT PCR NEGATIVE NEGATIVE Final    Comment: (NOTE) SARS-CoV-2 target nucleic acids are NOT DETECTED.  The SARS-CoV-2 RNA is generally detectable in upper respiratory specimens during the acute phase of infection. The lowest concentration of SARS-CoV-2 viral copies this assay can detect  is 138 copies/mL. A negative result does not preclude SARS-Cov-2 infection and should not be used as the sole basis for treatment or other patient management decisions. A negative result may occur with  improper specimen collection/handling, submission of specimen other than nasopharyngeal swab, presence of viral mutation(s) within the areas targeted by this assay, and inadequate number of viral copies(<138 copies/mL). A negative result must be combined with clinical observations, patient history, and epidemiological information. The expected result is Negative.  Fact Sheet for Patients:  EntrepreneurPulse.com.au  Fact Sheet for Healthcare Providers:  IncredibleEmployment.be  This test is no t yet approved or cleared by the [place] FDA and  has been authorized for detection and/or diagnosis of SARS-CoV-2 by FDA under an Emergency Use Authorization (EUA). This EUA will remain  in effect (meaning this test can be used) for the duration of the COVID-19 declaration under Section 564(b)(1) of the Act, 21 U.S.C.section 360bbb-3(b)(1), unless the authorization is terminated  or revoked sooner.       Influenza A by PCR NEGATIVE NEGATIVE Final   Influenza B by PCR NEGATIVE NEGATIVE Final    Comment: (NOTE) The Xpert Xpress SARS-CoV-2/FLU/RSV plus assay is intended as an aid in the diagnosis of influenza from Nasopharyngeal swab specimens and should not be used as a sole basis for treatment. Nasal washings and aspirates are unacceptable for Xpert Xpress SARS-CoV-2/FLU/RSV testing.  Fact Sheet for Patients: EntrepreneurPulse.com.au  Fact Sheet for Healthcare Providers: IncredibleEmployment.be  This test is not yet approved or cleared by the [place] FDA and has been authorized for detection and/or diagnosis of SARS-CoV-2 by FDA under an Emergency Use Authorization (EUA). This EUA will remain in effect  (meaning this test can be used) for the duration of the COVID-19 declaration under Section 564(b)(1) of the Act, 21 U.S.C. section 360bbb-3(b)(1), unless the authorization is terminated or revoked.  Performed at [facility], [address] [place], [place] [address]   Blood culture (routine x 2)     Status: None (Preliminary result)   Collection Time: 07/28/22  4:08 PM   Specimen: BLOOD RIGHT ARM  Result Value Ref Range Status   Specimen Description BLOOD RIGHT ARM  Final   Special Requests   Final    BOTTLES DRAWN AEROBIC AND ANAEROBIC Blood Culture adequate volume   Culture   Final    NO GROWTH 4 DAYS Performed at [facility], [address] [place], [place] [address]    Report Status PENDING  Incomplete  Blood culture (routine x 2)     Status: None (Preliminary result)   Collection Time: 07/28/22  4:08 PM   Specimen: BLOOD LEFT ARM  Result Value Ref Range Status   Specimen Description BLOOD LEFT ARM  Final   Special Requests   Final    BOTTLES DRAWN AEROBIC AND ANAEROBIC Blood Culture results may not be optimal due to an inadequate volume of blood received in culture bottles   Culture   Final    NO GROWTH 4 DAYS Performed at [facility], [address] [place], [place] [address]    Report Status PENDING  Incomplete     Labs: BNP (last 3 results) Recent Labs    07/28/22 1140  BNP 87.5   Basic Metabolic Panel: Recent Labs  Lab 07/28/22 1140 07/29/22 0435 07/30/22 0449 07/31/22 0549 08/01/22 0538  NA 137 139 139 137 139  K 3.1* 3.3* 4.0 3.3* 3.7  CL 98 105 105 104 108  CO2 _0 GLUCOSE 111* 165* 113* 92 98  BUN _1 CREATININE 0.71 0.54 0.67 0.54 0.55  CALCIUM 10.0 9.7 10.0 9.5 9.7  MG 2.1  --  2.4 2.4 2.4   Liver Function Tests: Recent Labs  Lab 07/28/22 1140  AST 21  ALT 21  ALKPHOS 96  BILITOT 0.9  PROT 7.3  ALBUMIN 3.9   No results for input(s): "LIPASE", "AMYLASE" in the last 168  hours. No results for input(s): "AMMONIA" in the last 168 hours. CBC: Recent Labs  Lab 07/28/22 1140 07/29/22 0435 07/30/22 0449 07/31/22 0549 08/01/22 0538  WBC 14.7* 9.2 17.6* 14.6* 13.2*  NEUTROABS 9.3*  --   --   --   --   HGB 16.7* 14.5 14.8 14.3 14.6  HCT 49.5* 41.9 44.2 43.0 43.3  MCV 85.3 84.0 85.2 84.1 84.9  PLT 285 265 309 301 293   Cardiac Enzymes: No results for input(s): "CKTOTAL", "CKMB", "CKMBINDEX", "TROPONINI" in the last 168 hours. BNP: Invalid input(s): "POCBNP" CBG: Recent Labs  Lab 07/31/22 0841 07/31/22 1143 07/31/22 1704 07/31/22 2129 08/01/22 0830  GLUCAP 110* 134* 138* 113* 92   D-Dimer No results for input(s): "DDIMER" in the last 72 hours. Hgb A1c No results for input(s): "HGBA1C" in the last 72 hours. Lipid Profile No results for input(s): "CHOL", "HDL", "LDLCALC", "TRIG", "CHOLHDL", "LDLDIRECT" in the last 72 hours. Thyroid function studies No results for input(s): "TSH", "T4TOTAL", "T3FREE", "THYROIDAB" in the last 72 hours.  Invalid input(s): "FREET3" Anemia work up No results for input(s): "VITAMINB12", "FOLATE", "FERRITIN", "TIBC", "IRON", "RETICCTPCT" in the last 72 hours. Urinalysis    Component Value Date/Time   COLORURINE YELLOW 08/05/2020 1033   APPEARANCEUR CLEAR 08/05/2020 1033   APPEARANCEUR Clear 12/19/2014 2255   LABSPEC 1.020 08/05/2020 1033   LABSPEC 1.009 12/19/2014 2255   PHURINE 6.5 08/05/2020 1033   GLUCOSEU 100 (A) 08/05/2020 1033   GLUCOSEU Negative 12/19/2014 2255   HGBUR NEGATIVE 08/05/2020 1033   BILIRUBINUR NEGATIVE 08/05/2020 1033   BILIRUBINUR snakk 03/24/2018 1036   BILIRUBINUR Negative 12/19/2014 2255   KETONESUR NEGATIVE 08/05/2020 1033   PROTEINUR NEGATIVE 08/05/2020 1033   UROBILINOGEN 0.2 03/24/2018 1036   NITRITE NEGATIVE 08/05/2020 1033   LEUKOCYTESUR NEGATIVE 08/05/2020 1033   LEUKOCYTESUR Negative 12/19/2014 2255   Sepsis Labs Recent Labs  Lab 07/29/22 0435 07/30/22 0449  07/31/22 0549 08/01/22 0538  WBC 9.2 17.6* 14.6* 13.2*   Microbiology Recent Results (from the  past 240 hour(s))  Resp panel by RT-PCR (RSV, Flu A&B, Covid) Anterior Nasal Swab     Status: None   Collection Time: 07/24/22  9:06 AM   Specimen: Anterior Nasal Swab  Result Value Ref Range Status   SARS Coronavirus 2 by RT PCR NEGATIVE NEGATIVE Final    Comment: (NOTE) SARS-CoV-2 target nucleic acids are NOT DETECTED.  The SARS-CoV-2 RNA is generally detectable in upper respiratory specimens during the acute phase of infection. The lowest concentration of SARS-CoV-2 viral copies this assay can detect is 138 copies/mL. A negative result does not preclude SARS-Cov-2 infection and should not be used as the sole basis for treatment or other patient management decisions. A negative result may occur with  improper specimen collection/handling, submission of specimen other than nasopharyngeal swab, presence of viral mutation(s) within the areas targeted by this assay, and inadequate number of viral copies(<138 copies/mL). A negative result must be combined with clinical observations, patient history, and epidemiological information. The expected result is Negative.  Fact Sheet for Patients:  EntrepreneurPulse.com.au  Fact Sheet for Healthcare Providers:  IncredibleEmployment.be  This test is no t yet approved or cleared by the [place] FDA and  has been authorized for detection and/or diagnosis of SARS-CoV-2 by FDA under an Emergency Use Authorization (EUA). This EUA will remain  in effect (meaning this test can be used) for the duration of the COVID-19 declaration under Section 564(b)(1) of the Act, 21 U.S.C.section 360bbb-3(b)(1), unless the authorization is terminated  or revoked sooner.       Influenza A by PCR NEGATIVE NEGATIVE Final   Influenza B by PCR NEGATIVE NEGATIVE Final    Comment: (NOTE) The Xpert Xpress SARS-CoV-2/FLU/RSV plus  assay is intended as an aid in the diagnosis of influenza from Nasopharyngeal swab specimens and should not be used as a sole basis for treatment. Nasal washings and aspirates are unacceptable for Xpert Xpress SARS-CoV-2/FLU/RSV testing.  Fact Sheet for Patients: EntrepreneurPulse.com.au  Fact Sheet for Healthcare Providers: IncredibleEmployment.be  This test is not yet approved or cleared by the [place] FDA and has been authorized for detection and/or diagnosis of SARS-CoV-2 by FDA under an Emergency Use Authorization (EUA). This EUA will remain in effect (meaning this test can be used) for the duration of the COVID-19 declaration under Section 564(b)(1) of the Act, 21 U.S.C. section 360bbb-3(b)(1), unless the authorization is terminated or revoked.     Resp Syncytial Virus by PCR NEGATIVE NEGATIVE Final    Comment: (NOTE) Fact Sheet for Patients: EntrepreneurPulse.com.au  Fact Sheet for Healthcare Providers: IncredibleEmployment.be  This test is not yet approved or cleared by the [place] FDA and has been authorized for detection and/or diagnosis of SARS-CoV-2 by FDA under an Emergency Use Authorization (EUA). This EUA will remain in effect (meaning this test can be used) for the duration of the COVID-19 declaration under Section 564(b)(1) of the Act, 21 U.S.C. section 360bbb-3(b)(1), unless the authorization is terminated or revoked.  Performed at [facility], [address], [place], [place] [address]   Resp Panel by RT-PCR (Flu A&B, Covid) Anterior Nasal Swab     Status: None   Collection Time: 07/28/22 11:40 AM   Specimen: Anterior Nasal Swab  Result Value Ref Range Status   SARS Coronavirus 2 by RT PCR NEGATIVE NEGATIVE Final    Comment: (NOTE) SARS-CoV-2 target nucleic acids are NOT DETECTED.  The SARS-CoV-2 RNA is generally detectable in upper respiratory specimens  during the acute phase of infection.  The lowest concentration of SARS-CoV-2 viral copies this assay can detect is 138 copies/mL. A negative result does not preclude SARS-Cov-2 infection and should not be used as the sole basis for treatment or other patient management decisions. A negative result may occur with  improper specimen collection/handling, submission of specimen other than nasopharyngeal swab, presence of viral mutation(s) within the areas targeted by this assay, and inadequate number of viral copies(<138 copies/mL). A negative result must be combined with clinical observations, patient history, and epidemiological information. The expected result is Negative.  Fact Sheet for Patients:  EntrepreneurPulse.com.au  Fact Sheet for Healthcare Providers:  IncredibleEmployment.be  This test is no t yet approved or cleared by the [place] FDA and  has been authorized for detection and/or diagnosis of SARS-CoV-2 by FDA under an Emergency Use Authorization (EUA). This EUA will remain  in effect (meaning this test can be used) for the duration of the COVID-19 declaration under Section 564(b)(1) of the Act, 21 U.S.C.section 360bbb-3(b)(1), unless the authorization is terminated  or revoked sooner.       Influenza A by PCR NEGATIVE NEGATIVE Final   Influenza B by PCR NEGATIVE NEGATIVE Final    Comment: (NOTE) The Xpert Xpress SARS-CoV-2/FLU/RSV plus assay is intended as an aid in the diagnosis of influenza from Nasopharyngeal swab specimens and should not be used as a sole basis for treatment. Nasal washings and aspirates are unacceptable for Xpert Xpress SARS-CoV-2/FLU/RSV testing.  Fact Sheet for Patients: EntrepreneurPulse.com.au  Fact Sheet for Healthcare Providers: IncredibleEmployment.be  This test is not yet approved or cleared by the [place] FDA and has been authorized for detection  and/or diagnosis of SARS-CoV-2 by FDA under an Emergency Use Authorization (EUA). This EUA will remain in effect (meaning this test can be used) for the duration of the COVID-19 declaration under Section 564(b)(1) of the Act, 21 U.S.C. section 360bbb-3(b)(1), unless the authorization is terminated or revoked.  Performed at [facility], [address] [place], [place] [address]   Blood culture (routine x 2)     Status: None (Preliminary result)   Collection Time: 07/28/22  4:08 PM   Specimen: BLOOD RIGHT ARM  Result Value Ref Range Status   Specimen Description BLOOD RIGHT ARM  Final   Special Requests   Final    BOTTLES DRAWN AEROBIC AND ANAEROBIC Blood Culture adequate volume   Culture   Final    NO GROWTH 4 DAYS Performed at [facility], [address], [place], [place] [address]    Report Status PENDING  Incomplete  Blood culture (routine x 2)     Status: None (Preliminary result)   Collection Time: 07/28/22  4:08 PM   Specimen: BLOOD LEFT ARM  Result Value Ref Range Status   Specimen Description BLOOD LEFT ARM  Final   Special Requests   Final    BOTTLES DRAWN AEROBIC AND ANAEROBIC Blood Culture results may not be optimal due to an inadequate volume of blood received in culture bottles   Culture   Final    NO GROWTH 4 DAYS Performed at [facility], [address] [place], [place] [address]    Report Status PENDING  Incomplete     Time coordinating discharge: Over 30 minutes  SIGNED:   Wyvonnia Dusky, MD  [facility] 08/01/2022, 12:14 PM Pager   If 7PM-7AM, please contact night-coverage [email or web address]

## 2022-08-01 NOTE — Plan of Care (Signed)

## 2022-08-02 ENCOUNTER — Telehealth: Payer: Self-pay | Admitting: *Deleted

## 2022-08-02 ENCOUNTER — Encounter: Payer: Self-pay | Admitting: *Deleted

## 2022-08-02 LAB — CULTURE, BLOOD (ROUTINE X 2)
Culture: NO GROWTH
Culture: NO GROWTH
Special Requests: ADEQUATE

## 2022-08-02 NOTE — Patient Outreach (Signed)
Care Coordination [facility] Note Transition Care Management Follow-up Telephone Call Date of discharge and from where: Thursday, 08/01/22 [facility]: shortness of breath/ cough/ COPD exacerbation with hypoxia How have you been since you were released from the hospital? "I am doing okay, I guess. I am just very worried about my daughter who lives with me, she is missing.... I have a filed a police report and they are looking for her, but I am just so upset about it.... her son, my grandson is still here with me, he is 49 years old and I have a lot of family members and friends/ neighbors who are looking in on me and helping with anything I need.  I am able to take care of all my daily care but I am just very worried about my daughter" Any questions or concerns? Yes- patient reports her daughter has been missing for several days and she is very upset about that; Care Coordination outreach call placed to established [facility] to update  Items Reviewed: Did the pt receive and understand the discharge instructions provided? Yes  Medications obtained and verified? Yes - verified obtained and is taking post-hospital discharge antibiotic and steroids; verbalizes good understanding of medication changes, reports self-manages medications and denies questions Other? No  Any new allergies since your discharge? No  Dietary orders reviewed? Yes Do you have support at home? Yes  reports extended family that live close by assisting with care needs as indicated: patient reports she is essentially independent in all aspects of self-care  Home Care and Equipment/Supplies: Were home health services ordered? no If so, what is the name of the agency? N/A  Has the agency set up a time to come to the patient's home? not applicable Were any new equipment or medical supplies ordered?  No What is the name of the medical supply agency? N/A Were you able to get the supplies/equipment? not applicable Do you have  any questions related to the use of the equipment or supplies? No N/A  Functional Questionnaire: (I = Independent and D = Dependent) ADLs: I  extended family that live close by assisting with care needs as indicated  Bathing/Dressing- I  Meal Prep- I  extended family that live close by assisting with care needs as indicated  Eating- I  Maintaining continence- I  Transferring/Ambulation- I  Managing Meds- I  Follow up appointments reviewed:  PCP Hospital f/u appt confirmed? Yes  Scheduled to see PCP Tally Joe NP on Monday 08/12/22 @ 10:40 am [facility] f/u appt confirmed? No  Scheduled to see - on - @ - confirmed scheduled follow up with oncology provider on 08/05/22, 08/07/22, and 08/08/22 Are transportation arrangements needed? No  If their condition worsens, is the pt aware to call PCP or go to the Emergency Dept.? Yes Was the patient provided with contact information for the PCP's office or ED? No- patient declined- reports already has contact information for providers Was to pt encouraged to call back with questions or concerns? Yes  SDOH assessments and interventions completed:   Yes SDOH Interventions Today    Flowsheet Row Most Recent Value  SDOH Interventions   Food Insecurity Interventions Intervention Not Indicated  Transportation Interventions Intervention Not Indicated  [Family providing transportation post-recent hospitalization]      Care Coordination Interventions:  Extensive emotional support provided around concerns with her daughter's well-being; care coordination outreach to established RN CM Care Coordinator  as FYI to update on patient's concerns around her daughter  Encounter Outcome:  Pt. Visit Completed    Oneta Rack, RN, BSN, CCRN Alumnus RN CM Care Coordination/ Transition of [place] Management [phone]: direct office

## 2022-08-05 ENCOUNTER — Encounter: Payer: Self-pay | Admitting: Surgery

## 2022-08-05 ENCOUNTER — Ambulatory Visit (INDEPENDENT_AMBULATORY_CARE_PROVIDER_SITE_OTHER): Payer: Medicare Other | Admitting: Surgery

## 2022-08-05 VITALS — BP 126/73 | HR 93 | Temp 98.0°F | Wt 159.6 lb

## 2022-08-05 DIAGNOSIS — C50412 Malignant neoplasm of upper-outer quadrant of left female breast: Secondary | ICD-10-CM

## 2022-08-05 DIAGNOSIS — Z170 Estrogen receptor positive status [ER+]: Secondary | ICD-10-CM

## 2022-08-05 DIAGNOSIS — Z08 Encounter for follow-up examination after completed treatment for malignant neoplasm: Secondary | ICD-10-CM

## 2022-08-05 NOTE — Patient Instructions (Signed)
If you have any concerns or questions, please feel free to call our office.   Breast Self-Awareness Breast self-awareness is knowing how your breasts look and feel. You need to: Check your breasts on a regular basis. Tell your doctor about any changes. Become familiar with the look and feel of your breasts. This can help you catch a breast problem while it is still small and can be treated. You should do breast self-exams even if you have breast implants. What you need: A mirror. A well-lit room. A pillow or other soft object. How to do a breast self-exam Follow these steps to do a breast self-exam: Look for changes  Take off all the clothes above your waist. Stand in front of a mirror in a room with good lighting. Put your hands down at your sides. Compare your breasts in the mirror. Look for any difference between them, such as: A difference in shape. A difference in size. Wrinkles, dips, and bumps in one breast and not the other. Look at each breast for changes in the skin, such as: Redness. Scaly areas. Skin that has gotten thicker. Dimpling. Open sores (ulcers). Look for changes in your nipples, such as: Fluid coming out of a nipple. Fluid around a nipple. Bleeding. Dimpling. Redness. A nipple that looks pushed in (retracted), or that has changed position. Feel for changes Lie on your back. Feel each breast. To do this: Pick a breast to feel. Place a pillow under the shoulder closest to that breast. Put the arm closest to that breast behind your head. Feel the nipple area of that breast using the hand of your other arm. Feel the area with the pads of your three middle fingers by making small circles with your fingers. Use light, medium, and firm pressure. Continue the overlapping circles, moving downward over the breast. Keep making circles with your fingers. Stop when you feel your ribs. Start making circles with your fingers again, this time going upward until you  reach your collarbone. Then, make circles outward across your breast and into your armpit area. Squeeze your nipple. Check for discharge and lumps. Repeat these steps to check your other breast. Sit or stand in the tub or shower. With soapy water on your skin, feel each breast the same way you did when you were lying down. Write down what you find Writing down what you find can help you remember what to tell your doctor. Write down: What is normal for each breast. Any changes you find in each breast. These include: The kind of changes you find. A tender or painful breast. Any lump you find. Write down its size and where it is. When you last had your monthly period (menstrual cycle). General tips If you are breastfeeding, the best time to check your breasts is after you feed your baby or after you use a breast pump. If you get monthly bleeding, the best time to check your breasts is 5-7 days after your monthly cycle ends. With time, you will become comfortable with the self-exam. You will also start to know if there are changes in your breasts. Contact a doctor if: You see a change in the shape or size of your breasts or nipples. You see a change in the skin of your breast or nipples, such as red or scaly skin. You have fluid coming from your nipples that is not normal. You find a new lump or thick area. You have breast pain. You have any concerns about your   breast health. Summary Breast self-awareness includes looking for changes in your breasts and feeling for changes within your breasts. You should do breast self-awareness in front of a mirror in a well-lit room. If you get monthly periods (menstrual cycles), the best time to check your breasts is 5-7 days after your period ends. Tell your doctor about any changes you see in your breasts. Changes include changes in size, changes on the skin, painful or tender breasts, or fluid from your nipples that is not normal. This information is  not intended to replace advice given to you by your health care provider. Make sure you discuss any questions you have with your health care provider. Document Revised: 01/24/2022 Document Reviewed: 06/21/2021 Elsevier Patient Education  2023 Elsevier Inc.  

## 2022-08-05 NOTE — Progress Notes (Signed)
08/05/2022  HPI: Alexandria [name] is a 66 y.o. female s/p left breast lumpectomy and sentinel lymph node biopsy on 07/17/22.  She presents for follow up.  She was admitted to the hospital on 11/26 with COPD exacerbation.  She now is feeling better and denies any breathing issues.  From the lumpectomy standpoint, she denies any breast pain or axillary pain.  Denies any swelling.  She wore the breast binder as instructed and stopped after 2 weeks.  Vital signs: BP 126/73   Pulse 93   Temp 98 F (36.7 C) (Oral)   Wt 159 lb 9.6 oz (72.4 kg)   SpO2 91%   BMI 25.00 kg/m    Physical Exam: Constitutional: No acute distress Breast: Left breast status post lumpectomy in the upper outer quadrant with incision healing well with Dermabond peeling off.  No evidence of infection and no significant swelling.  Left axilla incision is also healing well with Dermabond peeled off.  No swelling or evidence of infection.  Assessment/Plan: This is a 66 y.o. female s/p left breast lumpectomy and sentinel lymph node biopsy.  - Patient is healing well from the surgical standpoint without any postoperative complications.  She did have a COPD exacerbation postoperatively but is now feeling much better. - Reviewed pathology results with her showing that all lymph nodes sampled were negative and overall margins from the lumpectomy were clear. - Patient has follow-up with Dr. Baruch Gouty and Dr. Darrall Dears this week. - Follow-up with me in 6 months   Melvyn Neth, [facility]

## 2022-08-07 ENCOUNTER — Encounter: Payer: Self-pay | Admitting: *Deleted

## 2022-08-07 ENCOUNTER — Encounter: Payer: Self-pay | Admitting: Radiation Oncology

## 2022-08-07 ENCOUNTER — Ambulatory Visit
Admission: RE | Admit: 2022-08-07 | Discharge: 2022-08-07 | Disposition: A | Discharge status: Home / Self Care | Payer: Medicare Other | Source: Ambulatory Visit | Attending: Radiation Oncology | Admitting: Radiation Oncology

## 2022-08-07 ENCOUNTER — Ambulatory Visit: Payer: Medicare Other | Admitting: Internal Medicine

## 2022-08-07 VITALS — BP 134/78 | HR 92 | Temp 98.5°F | Resp 20 | Ht 67.0 in | Wt 160.9 lb

## 2022-08-07 DIAGNOSIS — C50412 Malignant neoplasm of upper-outer quadrant of left female breast: Secondary | ICD-10-CM | POA: Insufficient documentation

## 2022-08-07 DIAGNOSIS — Z510 Encounter for antineoplastic radiation therapy: Secondary | ICD-10-CM | POA: Insufficient documentation

## 2022-08-07 DIAGNOSIS — Z170 Estrogen receptor positive status [ER+]: Secondary | ICD-10-CM | POA: Diagnosis not present

## 2022-08-07 NOTE — Consult Note (Signed)
NEW PATIENT EVALUATION  Name: Alexandria [name]  MRN: [id number]  Date:   08/07/2022     DOB: 01-Jun-1956   This 66 y.o. 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   PREVIOUS INVESTIGATIONS:  Pathology reports reviewed Clinical notes reviewed Mammograms ultrasound reviewed  HPI: Patient is a 66 year old female who presents with an abnormal mammogram of her left breast showing architectural distortion in the upper outer quadrant with out sonographic correlate.  She underwent ultrasound-guided biopsy which was positive for invasive mammary carcinoma 5 mm.  Left axillary ultrasound was within normal limits.  She went on to have a wide local excision showing only residual ductal carcinoma in situ intermediate grade.  Her margins were clear at 5 mm.  3 sentinel lymph nodes were examined all negative for metastatic disease.  Tumor was ER positive PR negative and HER2/neu not overexpressed.  She was hospitalized after her lumpectomy with COPD with acute exacerbation.  She is seen today for opinion and is at this point doing well.  Specifically denies breast tenderness cough or bone pain.  PLANNED TREATMENT REGIMEN: Left hypofractionated whole breast radiation with breath-hold  PAST MEDICAL HISTORY:  has a past medical history of Anxiety, Cancer ([place]), Carbuncle and furuncle of trunk, COPD (chronic obstructive pulmonary disease) ([place]), Depression, Diabetes mellitus without complication ([place]), Dyspnea, GERD (gastroesophageal reflux disease), HTN (hypertension), Hyperlipidemia, Intrinsic asthma, unspecified, Methicillin  resistant Staphylococcus aureus in conditions classified elsewhere and of unspecified site, Other psychological or physical stress, not elsewhere classified(V62.89), Pneumonia, Pre-diabetes, and Tobacco abuse.    PAST SURGICAL HISTORY:  Past Surgical History:  Procedure Laterality Date   BREAST BIOPSY Left 06/26/2022   stereo biopsy/ coil clip/ path pending   BREAST LUMPECTOMY,RADIO FREQ [place] BIOPSY Left 07/17/2022   Procedure: BREAST LUMPECTOMY,RADIO FREQ LOCALIZER,AXILLARY SENTINEL LYMPH NODE BIOPSY;  Surgeon: Olean Ree, MD;  Location: [facility];  Service: General;  Laterality: Left;   COLONOSCOPY WITH PROPOFOL N/A 04/10/2015   Procedure: COLONOSCOPY WITH PROPOFOL;  Surgeon: Lucilla Lame, MD;  Location: [place];  Service: Endoscopy;  Laterality: N/A;   COLONOSCOPY WITH PROPOFOL N/A 06/25/2021   Procedure: COLONOSCOPY WITH PROPOFOL;  Surgeon: Lucilla Lame, MD;  Location: [place];  Service: Endoscopy;  Laterality: N/A;  leave at 8 arrival   ESOPHAGOGASTRODUODENOSCOPY (EGD) WITH PROPOFOL N/A 12/06/2021   Procedure: ESOPHAGOGASTRODUODENOSCOPY (EGD) WITH PROPOFOL;  Surgeon: Lucilla Lame, MD;  Location: [place];  Service: Endoscopy;  Laterality: N/A;   POLYPECTOMY  04/10/2015   Procedure: POLYPECTOMY;  Surgeon: Lucilla Lame, MD;  Location: [place];  Service: Endoscopy;;   POLYPECTOMY N/A 06/25/2021   Procedure: POLYPECTOMY;  Surgeon: Lucilla Lame, MD;  Location: [place];  Service: Endoscopy;  Laterality: N/A;   STATUS POST ENDOMETRIAL ABLATION     TONSILLECTOMY     TUBAL LIGATION     bilateral, sterile ablation   VARICOSE VEINS      FAMILY HISTORY: family history includes Coronary artery disease in her mother; Diabetes in her sister; Emphysema in her father; Gout in her father; Heart attack in her mother; Hypertension in her daughter, daughter, and mother; Lung  cancer in her father; Obesity in her  daughter; Rheum arthritis in her mother; Seizures in her daughter.  SOCIAL HISTORY:  reports that she has been smoking cigarettes. She has a 15.00 pack-year smoking history. She has never used smokeless tobacco. She reports that she does not drink alcohol and does not use drugs.  ALLERGIES: Pravastatin sodium  MEDICATIONS:  Current Outpatient Medications  Medication Sig Dispense Refill   acetaminophen (TYLENOL) 500 MG tablet Take 1,000 mg by mouth every 6 (six) hours as needed for mild pain.     albuterol (VENTOLIN HFA) 108 (90 Base) MCG/ACT inhaler Inhale 1-2 puffs into the lungs every 4 (four) hours as needed for wheezing or shortness of breath. 1 each 0   amLODipine (NORVASC) 10 MG tablet Take 1 tablet (10 mg total) by mouth daily. 90 tablet 3   benzonatate (TESSALON) 100 MG capsule Take 1 capsule (100 mg total) by mouth 3 (three) times daily as needed. 30 capsule 0   cetirizine (ZYRTEC) 10 MG tablet Take 1 tablet (10 mg total) by mouth daily. 30 tablet 11   clonazePAM (KLONOPIN) 0.5 MG tablet TAKE ONE-HALF TABLET BY MOUTH 3  TIMES DAILY AS NEEDED FOR  ANXIETY 90 tablet 0   famotidine (PEPCID) 20 MG tablet Take 20 mg by mouth as needed for heartburn.     hydrochlorothiazide (HYDRODIURIL) 25 MG tablet TAKE 1 TABLET BY MOUTH DAILY 60 tablet 5   ibuprofen (ADVIL) 600 MG tablet Take 1 tablet (600 mg total) by mouth every 8 (eight) hours as needed for moderate pain. 60 tablet 1   loperamide (IMODIUM) 2 MG capsule TAKE 1 CAPSULE BY MOUTH 4 TIMES  DAILY AS NEEDED FOR DIARRHEA OR  LOOSE STOOLS 60 capsule 0   metFORMIN (GLUCOPHAGE-XR) 750 MG 24 hr tablet Take 750 mg by mouth daily with supper.     MULTIPLE VITAMIN PO Take 1 tablet by mouth daily. Reported on 03/13/2016     ondansetron (ZOFRAN-ODT) 4 MG disintegrating tablet Take 1 tablet (4 mg total) by mouth every 8 (eight) hours as needed for nausea or vomiting. 20 tablet 0   oxyCODONE (OXY IR/ROXICODONE) 5 MG immediate release tablet Take 1 tablet  (5 mg total) by mouth every 4 (four) hours as needed for severe pain. 30 tablet 0   rosuvastatin (CRESTOR) 40 MG tablet Take 1 tablet (40 mg total) by mouth daily. 90 tablet 3   Spacer/Aero-Holding Chambers (AEROCHAMBER MV) inhaler Use as instructed 1 each 1   TRELEGY ELLIPTA 100-62.5-25 MCG/ACT AEPB USE 1 INHALATION BY MOUTH DAILY 120 each 5   No current facility-administered medications for this encounter.    ECOG PERFORMANCE STATUS:  0 - Asymptomatic  REVIEW OF SYSTEMS: Patient has a history of COPD with acute exacerbation.  Also history of hypokalemia acute respiratory failure history anxiety and hypertension Patient denies any weight loss, fatigue, weakness, fever, chills or night sweats. Patient denies any loss of vision, blurred vision. Patient denies any ringing  of the ears or hearing loss. No irregular heartbeat. Patient denies heart murmur or history of fainting. Patient denies any chest pain or pain radiating to her upper extremities. Patient denies any shortness of breath, difficulty breathing at night, cough or hemoptysis. Patient denies any swelling in the lower legs. Patient denies any nausea vomiting, vomiting of blood, or coffee ground material in the vomitus. Patient denies any stomach pain. Patient states has had normal bowel movements no significant constipation or diarrhea. Patient denies any dysuria, hematuria or significant nocturia.  Patient denies any problems walking, swelling in the joints or loss of balance. Patient denies any skin changes, loss of hair or loss of weight. Patient denies any excessive worrying or anxiety or significant depression. Patient denies any problems with insomnia. Patient denies excessive thirst, polyuria, polydipsia. Patient denies any swollen glands, patient denies easy bruising or easy bleeding. Patient denies any recent infections, allergies or URI. Patient "s visual fields have not changed significantly in recent time.   PHYSICAL EXAM: BP 134/78  (BP Location: Right Arm, Patient Position: Sitting, Cuff Size: Normal)   Pulse 92   Temp 98.5 F (36.9 C) (Tympanic)   Resp 20   Ht _0  (1.702 m)   Wt 160 lb 14.4 oz (73 kg)   BMI 25.20 kg/m  She status post wide local excision of left breast.  Incisions healed well.  No dominant masses noted in either breast.  No axillary or supraclavicular adenopathy is appreciated.  Well-developed well-nourished patient in NAD. HEENT reveals PERLA, EOMI, discs not visualized.  Oral cavity is clear. No oral mucosal lesions are identified. Neck is clear without evidence of cervical or supraclavicular adenopathy. Lungs are clear to A&P. Cardiac examination is essentially unremarkable with regular rate and rhythm without murmur rub or thrill. Abdomen is benign with no organomegaly or masses noted. Motor sensory and DTR levels are equal and symmetric in the upper and lower extremities. Cranial nerves II through XII are grossly intact. Proprioception is intact. No peripheral adenopathy or edema is identified. No motor or sensory levels are noted. Crude visual fields are within normal range.  LABORATORY DATA: Pathology reports reviewed    RADIOLOGY RESULTS: Mammogram and ultrasound reviewed compatible with above-stated findings   IMPRESSION: 66 year old female with stage Ia invasive mammary carcinoma of the left breast status post wide local excision and sentinel node biopsy ER positive who is postoperative course was complicated by acute respiratory failure and exacerbation of COPD  PLAN: This time I have recommended left whole breast radiation and hypofractionated course over 3 weeks.  Will also boost her scar another 1000 cGy using either electron or photon beam.  We use breath-hold technique to spare her heart.  Risks and benefits of treatment including skin reaction fatigue alteration of blood counts possible inclusion of superficial lung all were described in detail to the patient.  She seems to comprehend my  treatment plan well.  I personally set up and ordered CT simulation for next week to allow some further healing.  Patient comprehends my recommendations well.  I would like to take this opportunity to thank you for allowing me to participate in the care of your patient.Noreene Filbert, MD

## 2022-08-07 NOTE — Progress Notes (Signed)
Met with Alexandria [name] during radiation consultation.

## 2022-08-08 ENCOUNTER — Inpatient Hospital Stay: Payer: Medicare Other | Attending: Internal Medicine | Admitting: Internal Medicine

## 2022-08-08 ENCOUNTER — Encounter: Payer: Self-pay | Admitting: *Deleted

## 2022-08-08 ENCOUNTER — Encounter: Payer: Self-pay | Admitting: Internal Medicine

## 2022-08-08 VITALS — BP 114/70 | HR 76 | Temp 96.8°F | Resp 18 | Wt 161.6 lb

## 2022-08-08 DIAGNOSIS — Z79899 Other long term (current) drug therapy: Secondary | ICD-10-CM | POA: Diagnosis not present

## 2022-08-08 DIAGNOSIS — I10 Essential (primary) hypertension: Secondary | ICD-10-CM | POA: Diagnosis not present

## 2022-08-08 DIAGNOSIS — Z7984 Long term (current) use of oral hypoglycemic drugs: Secondary | ICD-10-CM | POA: Insufficient documentation

## 2022-08-08 DIAGNOSIS — Z79811 Long term (current) use of aromatase inhibitors: Secondary | ICD-10-CM | POA: Diagnosis not present

## 2022-08-08 DIAGNOSIS — C50412 Malignant neoplasm of upper-outer quadrant of left female breast: Secondary | ICD-10-CM | POA: Diagnosis not present

## 2022-08-08 DIAGNOSIS — Z170 Estrogen receptor positive status [ER+]: Secondary | ICD-10-CM

## 2022-08-08 DIAGNOSIS — F17210 Nicotine dependence, cigarettes, uncomplicated: Secondary | ICD-10-CM | POA: Diagnosis not present

## 2022-08-08 DIAGNOSIS — E119 Type 2 diabetes mellitus without complications: Secondary | ICD-10-CM | POA: Diagnosis not present

## 2022-08-08 DIAGNOSIS — Z5181 Encounter for therapeutic drug level monitoring: Secondary | ICD-10-CM

## 2022-08-08 MED ORDER — LETROZOLE 2.5 MG PO TABS: 2.5000 mg | ORAL_TABLET | Freq: Every day | ORAL | 2 refills | Status: DC

## 2022-08-08 NOTE — Progress Notes (Signed)
[name]: Gwyneth Sprout, FNP as PCP - General (Family Medicine) Daiva Huge, RN as Oncology Nurse Navigator Valente David, RN as [place] Management  REFERRING PROVIDER: Tally Joe, FNP  REASON FOR REFFERAL: left breast cancer  CANCER STAGING   Cancer Staging  Breast cancer, left [facility]) Staging form: Breast, AJCC 8th Edition - Clinical stage from 06/26/2022: Stage IA (cT1b, cN0, cM0, G2, ER+, PR-, HER2-) - Signed by [name], MD on 07/01/2022 Stage prefix: Initial diagnosis Method of lymph node assessment: Clinical Nuclear grade: G2 Mitotic count score: Score 1 Histologic grading system: 3 grade system   ASSESSMENT & PLAN:  Alexandria [name] 66 y.o. 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  #Left breast IDC, ER+ PR- HER2-, Stage 1A -Detected on screening mammogram done on 05/15/2022. Biopsy specimen showed 61m invasive mammary cancer with grade 2.   - s/p lumpectomy with SLNB by Dr. PHampton Abboton 07/17/2022. Surgical pathology showed single focus of invasive carcinoma, size 5 mm, overall grade 2, DCIS present intermediate grade, margins negative, 0/3 lymph nodes negative. Oncotype DX pending. But I am not anticipating chemotherapy.  She was seen by Dr. CBaruch Goutyand will proceed with simulation as planned next week. I discussed about aromatase inhibitors which include letrozole, anastrozole and exemestane.  Duration of treatment will be 5 years.  Side effects were discussed including but not limited to myalgias, arthralgias, mood changes, hot flashes, thinning of bone.  Advised to take calcium and vitamin D supplements.  Encouraged weightbearing exercises to help with bone health. Discussed Aromatase inhibitors versus tamoxifen in early breast  cancer: patient-level meta-analysis of the randomised trials. Aromatase inhibitors reduce recurrence rates by about 30% (proportionately) compared with tamoxifen while treatments differ, but not thereafter. 5 years of an aromatase inhibitor reduces 10-year breast cancer mortality rates by about 15% compared with 5 years of tamoxifen, hence by about 40% (proportionately) compared with no endocrine treatment.    - DEXA scan   #Genetics - scheduled on 12/20.   Orders Placed This Encounter  Procedures   DG Bone Density    Standing Status:   Future    Standing Expiration Date:   08/08/2023    Scheduling Instructions:     In 4-6 weeks    Order Specific Question:   Reason for Exam (SYMPTOM  OR DIAGNOSIS REQUIRED)    Answer:   prior to starting AI    Order Specific Question:   Preferred imaging location?    Answer:   [facility]   RTC in 10 weeks for md visit for letrozole toxicity check   The total time spent in the appointment was 30 minutes encounter with patients including review of chart and various tests results, discussions about plan of care and coordination of care plan   All questions were answered. The patient knows to call the clinic with any problems, questions or concerns. No barriers to learning was detected.  KJane Canary MD 12/7/202311:07 AM   HISTORY OF PRESENTING ILLNESS:  Alexandria [name] 66y.o. 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  Patient was seen today postsurgery.  She was admitted a week  ago for COPD exacerbation.  She completed her antibiotics and steroid course.  She is feeling better.  Shortness of breath is improved but feels it on exertion.  Healing well from surgery.  I have reviewed her chart and materials related to her cancer extensively and collaborated history with the  patient. Summary of oncologic history is as follows: Oncology History  Breast cancer, left ([place])  05/15/2022 Mammogram   Screening mammogram-  FINDINGS: In the left breast, a possible asymmetry and distortion with calcifications warrant further evaluation. In the right breast, no findings suspicious for malignancy.   IMPRESSION: Further evaluation is suggested for possible asymmetry and distortion with calcifications in the left breast.     06/06/2022 Mammogram   Diagnostic mammogram and US IMPRESSION: 1. Left breast architectural distortion in the upper-outer quadrant posterior depth, without a sonographic correlate, is intermediate suspicion for malignancy. 2. Previously described asymmetry in the inner left breast does not persist with additional views, consistent with superimposed fibroglandular tissue. 3. Incidentally visualized 3 mm benign simple cyst in the left breast 6 o'clock position.   RECOMMENDATION: Left breast stereotactic guided biopsy of the architectural distortion in the upper-outer quadrant (1 site).  [place] left axilla normal    06/26/2022 Pathology Results   DIAGNOSIS:  A. BREAST, LEFT UPPER OUTER QUADRANT, POSTERIOR DEPTH; STEREOTACTIC CORE  NEEDLE BIOPSY:  - INVASIVE MAMMARY CARCINOMA, NO SPECIAL TYPE.  - CALCIFICATIONS ASSOCIATED WITH BENIGN AND NEOPLASTIC MAMMARY ELEMENTS.   Size of invasive carcinoma: 5 mm in this sample  Histologic grade of invasive carcinoma: Grade 2                       Glandular/tubular differentiation score: 3                       Nuclear pleomorphism score: 2                       Mitotic rate score: 1                       Total score: 6  Ductal carcinoma in situ: Present, grade 2-3, with focal comedonecrosis  Lymphovascular invasion: Not identified   ADDENDUM:  CASE SUMMARY: BREAST BIOMARKER TESTS  Estrogen Receptor (ER) Status: POSITIVE          Percentage of cells with nuclear positivity: Greater than 90%           Average intensity of staining: Strong   Progesterone Receptor (PgR) Status: NEGATIVE (LESS THAN 1%)          Internal control cells present and stain as expected   HER2 (by immunohistochemistry): NEGATIVE (Score 1+)  Ki-67: Not performed     07/17/2022 Definitive Surgery   Lumpectomy with SLNB with Dr. Hampton Abbot   TUMOR Histologic Type: Invasive carcinoma of no special type Histologic Grade ([place] Histologic Score)      Glandular (Acinar)/Tubular Differentiation: 3      Nuclear Pleomorphism: 2      Mitotic Rate: 1      Overall Grade: Grade 2 Tumor Size: 5 mm Tumor Focality: Single focus of invasive carcinoma Ductal Carcinoma In Situ (DCIS): Present, intermediate grade Tumor Extent: Not applicable Lymphatic and/or Vascular Invasion: Not identified Treatment Effect in the Breast: No known presurgical therapy  MARGINS Margin Status for Invasive Carcinoma: All margins negative for invasive carcinoma      Distance from closest margin: At  least 5 mm      Specify closest margin: All surgical margins  Margin Status for DCIS: All margins negative for DCIS      Distance from DCIS to closest margin: At least 5 mm      Specify closest margin: All surgical margins  REGIONAL LYMPH NODES Regional Lymph Node Status: All regional lymph nodes negative for tumor      Total Number of Lymph Nodes Examined (sentinel and non-sentinel): 3       Number of Sentinel Nodes Examined: 3  DISTANT METASTASIS Distant Site(s) Involved, if applicable: Not applicable  PATHOLOGIC STAGE CLASSIFICATION (pTNM, AJCC 8th Edition): Modified Classification: Not applicable pT Category: pT1a T Suffix: Not applicable pN Category: pN0 N Suffix: sn pM Category: Not applicable  SPECIAL STUDIES Breast Biomarker Testing Performed on Previous Biopsy: [id number] Estrogen Receptor (ER) Status: POSITIVE         Percentage of cells with nuclear positivity: Greater than 90%         Average intensity of staining:  Strong  Progesterone Receptor (PgR) Status: NEGATIVE (LESS THAN 1%)         Internal control cells present and stain as expected  HER2 (by immunohistochemistry): NEGATIVE (Score 1+) Ki-67: Not performed     Menarche age 54 Children 3 Age at first birth 63 Birth control OCP less than 5 years Menopause age 28 HRT no History of breast biopsies no Family history not significant  MEDICAL HISTORY:  Past Medical History:  Diagnosis Date   Anxiety    Cancer ([place])    Carbuncle and furuncle of trunk    COPD (chronic obstructive pulmonary disease) (HCC)    Depression    Diabetes mellitus without complication (HCC)    Dyspnea    GERD (gastroesophageal reflux disease)    HTN (hypertension)    Hyperlipidemia    Intrinsic asthma, unspecified    Methicillin resistant Staphylococcus aureus in conditions classified elsewhere and of unspecified site    Other psychological or physical stress, not elsewhere classified(V62.89)    Pneumonia    Pre-diabetes    Tobacco abuse     SURGICAL HISTORY: Past Surgical History:  Procedure Laterality Date   BREAST BIOPSY Left 06/26/2022   stereo biopsy/ coil clip/ path pending   BREAST LUMPECTOMY,RADIO FREQ LOCALIZER,AXILLARY SENTINEL LYMPH NODE BIOPSY Left 07/17/2022   Procedure: BREAST LUMPECTOMY,RADIO FREQ LOCALIZER,AXILLARY SENTINEL LYMPH NODE BIOPSY;  Surgeon: Olean Ree, MD;  Location: [facility];  Service: General;  Laterality: Left;   COLONOSCOPY WITH PROPOFOL N/A 04/10/2015   Procedure: COLONOSCOPY WITH PROPOFOL;  Surgeon: Lucilla Lame, MD;  Location: [place];  Service: Endoscopy;  Laterality: N/A;   COLONOSCOPY WITH PROPOFOL N/A 06/25/2021   Procedure: COLONOSCOPY WITH PROPOFOL;  Surgeon: Lucilla Lame, MD;  Location: [place];  Service: Endoscopy;  Laterality: N/A;  leave at 8 arrival   ESOPHAGOGASTRODUODENOSCOPY (EGD) WITH PROPOFOL N/A 12/06/2021   Procedure: ESOPHAGOGASTRODUODENOSCOPY (EGD) WITH PROPOFOL;  Surgeon:  Lucilla Lame, MD;  Location: [place];  Service: Endoscopy;  Laterality: N/A;   POLYPECTOMY  04/10/2015   Procedure: POLYPECTOMY;  Surgeon: Lucilla Lame, MD;  Location: [place];  Service: Endoscopy;;   POLYPECTOMY N/A 06/25/2021   Procedure: POLYPECTOMY;  Surgeon: Lucilla Lame, MD;  Location: [place];  Service: Endoscopy;  Laterality: N/A;   STATUS POST ENDOMETRIAL ABLATION     TONSILLECTOMY     TUBAL LIGATION     bilateral, sterile ablation   VARICOSE VEINS  SOCIAL HISTORY: Social History   Socioeconomic History   Marital status: Widowed    Spouse name: Not on file   Number of children: 3   Years of education: 12   Highest education level: Not on file  Occupational History   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  Tobacco Use   Smoking status: Every Day    Packs/day: 0.50    Years: 30.00    Total pack years: 15.00    Types: Cigarettes    Last attempt to quit: 01/31/2017    Years since quitting: 5.5   Smokeless tobacco: Never  Vaping Use   Vaping Use: Never used  Substance and Sexual Activity   Alcohol use: No    Alcohol/week: 0.0 standard drinks of alcohol   Drug use: No   Sexual activity: Never  Other Topics Concern   Not on file  Social History Narrative   Lives alone - daughter stays with her most of the time   Social Determinants of Health   Financial Resource Strain: Low Risk  (04/08/2022)   Overall Financial Resource Strain (CARDIA)    Difficulty of Paying Living Expenses: Not hard at all  Food Insecurity: No Food Insecurity (08/02/2022)   Hunger Vital Sign    Worried About Running Out of Food in the Last Year: Never true    Ran Out of Food in the Last Year: Never true  Transportation Needs: No Transportation Needs (08/02/2022)   PRAPARE - Hydrologist (Medical): No    Lack of Transportation (Non-Medical): No  Physical Activity: Insufficiently Active (04/08/2022)   Exercise  Vital Sign    Days of Exercise per Week: 2 days    Minutes of Exercise per Session: 20 min  Stress: No Stress Concern Present (04/08/2022)   [place]    Feeling of Stress : Not at all  Social Connections: Socially Isolated (04/08/2022)   Social Connection and Isolation Panel [NHANES]    Frequency of Communication with Friends and Family: More than three times a week    Frequency of Social Gatherings with Friends and Family: Once a week    Attends Religious Services: Never    Marine scientist or Organizations: No    Attends Archivist Meetings: Never    Marital Status: Widowed  Intimate Partner Violence: Not At Risk (07/29/2022)   Humiliation, Afraid, Rape, and Kick questionnaire    Fear of Current or Ex-Partner: No    Emotionally Abused: No    Physically Abused: No    Sexually Abused: No    FAMILY HISTORY: Family History  Problem Relation Age of Onset   Coronary artery disease Mother    Hypertension Mother    Rheum arthritis Mother    Heart attack Mother    Lung cancer Father    Emphysema Father    Gout Father    Diabetes Sister    Hypertension Daughter    Hypertension Daughter    Obesity Daughter    Seizures Daughter    Breast cancer Neg Hx    Bladder Cancer Neg Hx    Kidney cancer Neg Hx     ALLERGIES:  is allergic to pravastatin sodium.  MEDICATIONS:  Current Outpatient Medications  Medication Sig Dispense Refill   acetaminophen (TYLENOL) 500 MG tablet Take 1,000 mg by mouth every 6 (six) hours as needed for mild pain.     albuterol (VENTOLIN HFA)  108 (90 Base) MCG/ACT inhaler Inhale 1-2 puffs into the lungs every 4 (four) hours as needed for wheezing or shortness of breath. 1 each 0   amLODipine (NORVASC) 10 MG tablet Take 1 tablet (10 mg total) by mouth daily. 90 tablet 3   benzonatate (TESSALON) 100 MG capsule Take 1 capsule (100 mg total) by mouth 3 (three) times daily as needed.  30 capsule 0   cetirizine (ZYRTEC) 10 MG tablet Take 1 tablet (10 mg total) by mouth daily. 30 tablet 11   clonazePAM (KLONOPIN) 0.5 MG tablet TAKE ONE-HALF TABLET BY MOUTH 3  TIMES DAILY AS NEEDED FOR  ANXIETY 90 tablet 0   famotidine (PEPCID) 20 MG tablet Take 20 mg by mouth as needed for heartburn.     hydrochlorothiazide (HYDRODIURIL) 25 MG tablet TAKE 1 TABLET BY MOUTH DAILY 60 tablet 5   ibuprofen (ADVIL) 600 MG tablet Take 1 tablet (600 mg total) by mouth every 8 (eight) hours as needed for moderate pain. 60 tablet 1   letrozole (FEMARA) 2.5 MG tablet Take 1 tablet (2.5 mg total) by mouth daily. Start one week after completion of radiation 30 tablet 2   loperamide (IMODIUM) 2 MG capsule TAKE 1 CAPSULE BY MOUTH 4 TIMES  DAILY AS NEEDED FOR DIARRHEA OR  LOOSE STOOLS 60 capsule 0   metFORMIN (GLUCOPHAGE-XR) 750 MG 24 hr tablet Take 750 mg by mouth daily with supper.     MULTIPLE VITAMIN PO Take 1 tablet by mouth daily. Reported on 03/13/2016     ondansetron (ZOFRAN-ODT) 4 MG disintegrating tablet Take 1 tablet (4 mg total) by mouth every 8 (eight) hours as needed for nausea or vomiting. 20 tablet 0   oxyCODONE (OXY IR/ROXICODONE) 5 MG immediate release tablet Take 1 tablet (5 mg total) by mouth every 4 (four) hours as needed for severe pain. 30 tablet 0   rosuvastatin (CRESTOR) 40 MG tablet Take 1 tablet (40 mg total) by mouth daily. 90 tablet 3   Spacer/Aero-Holding Chambers (AEROCHAMBER MV) inhaler Use as instructed 1 each 1   TRELEGY ELLIPTA 100-62.5-25 MCG/ACT AEPB USE 1 INHALATION BY MOUTH DAILY 120 each 5   No current facility-administered medications for this visit.    REVIEW OF SYSTEMS:   Pertinent information mentioned in HPI All other systems were reviewed with the patient and are negative.  PHYSICAL EXAMINATION: ECOG PERFORMANCE STATUS: 1 - Symptomatic but completely ambulatory  Vitals:   08/08/22 1004  BP: 114/70  Pulse: 76  Resp: 18  Temp: (!) 96.8 F (36 C)  SpO2:  95%    Filed Weights   08/08/22 1004  Weight: 161 lb 9.6 oz (73.3 kg)     GENERAL:alert, no distress and comfortable SKIN: skin color, texture, turgor are normal, no rashes or significant lesions EYES: normal, conjunctiva are pink and non-injected, sclera clear OROPHARYNX:no exudate, no erythema and lips, buccal mucosa, and tongue normal  NECK: supple, thyroid normal size, non-tender, without nodularity LYMPH:  no palpable lymphadenopathy in the cervical, axillary or inguinal LUNGS: clear to auscultation and percussion with normal breathing effort HEART: regular rate & rhythm and no murmurs and no lower extremity edema ABDOMEN:abdomen soft, non-tender and normal bowel sounds Musculoskeletal:no cyanosis of digits and no clubbing  PSYCH: alert & oriented x 3 with fluent speech NEURO: no focal motor/sensory deficits  LABORATORY DATA:  I have reviewed the data as listed Lab Results  Component Value Date   WBC 13.2 (H) 08/01/2022   HGB 14.6 08/01/2022  HCT 43.3 08/01/2022   MCV 84.9 08/01/2022   PLT 293 08/01/2022   Recent Labs    04/11/22 1048 07/28/22 1140 07/29/22 0435 07/30/22 0449 07/31/22 0549 08/01/22 0538  NA 140 137   < > 139 137 139  K 4.1 3.1*   < > 4.0 3.3* 3.7  CL 97 98   < > 105 104 108  CO2 27 28   < > _0 GLUCOSE 103* 111*   < > 113* 92 98  BUN 6* 14   < > _1 CREATININE 0.69 0.71   < > 0.67 0.54 0.55  CALCIUM 10.3 10.0   < > 10.0 9.5 9.7  GFRNONAA  --  >60   < > >60 >60 >60  PROT 6.8 7.3  --   --   --   --   ALBUMIN 4.6 3.9  --   --   --   --   AST 17 21  --   --   --   --   ALT 21 21  --   --   --   --   ALKPHOS 135* 96  --   --   --   --   BILITOT 0.5 0.9  --   --   --   --    < > = values in this interval not displayed.    RADIOGRAPHIC STUDIES: I have personally reviewed the radiological images as listed and agreed with the findings in the report. CT Angio Chest PE W and/or Wo Contrast  Result Date: 07/28/2022 CLINICAL DATA:   Shortness of breath for 1 week. Clinical concern for pulmonary embolus. Status post left breast lumpectomy on 07/17/2022 EXAM: CT ANGIOGRAPHY CHEST WITH CONTRAST TECHNIQUE: Multidetector CT imaging of the chest was performed using the standard protocol during bolus administration of intravenous contrast. Multiplanar CT image reconstructions and MIPs were obtained to evaluate the vascular anatomy. RADIATION DOSE REDUCTION: This exam was performed according to the departmental dose-optimization program which includes automated exposure control, adjustment of the mA and/or kV according to patient size and/or use of iterative reconstruction technique. CONTRAST:  71m OMNIPAQUE IOHEXOL 350 MG/ML SOLN COMPARISON:  None Available. FINDINGS: Cardiovascular: The heart size is normal. No substantial pericardial effusion. Coronary artery calcification is evident. Mild atherosclerotic calcification is noted in the wall of the thoracic aorta. There is no filling defect within the opacified pulmonary arteries to suggest the presence of an acute pulmonary embolus. Mediastinum/Nodes: No mediastinal lymphadenopathy. 10 mm short axis right hilar lymph node is upper normal for size. No left hilar lymphadenopathy. The esophagus has normal imaging features. There is no axillary lymphadenopathy. Lungs/Pleura: Centrilobular and paraseptal emphysema evident. Circumferential bronchial wall thickening noted with basilar predominant micro nodularity, right greater than left. 3 mm right middle lobe nodule on 84/5 is 1 of the more dominant nodules in either lung base. No focal airspace consolidation. No pleural effusion. Upper Abdomen: High attenuation material noted in the gallbladder lumen, incompletely visualized. No history of recent contrast infused study to suggest vicarious excretion of contrast. This may represent dense gallbladder sludge. Musculoskeletal: No worrisome lytic or sclerotic osseous abnormality. Gas is identified in the  soft tissues of the left breast including a 2.8 x 1.9 cm collection of gas and fluid in the medial left breast on image 65/4. Review of the MIP images confirms the above findings. IMPRESSION: 1. No CT evidence for acute pulmonary embolus. 2. Circumferential bronchial wall thickening with basilar  predominant micro nodularity, right greater than left. Imaging features are compatible with an infectious/inflammatory etiology. Atypical infection should be considered. Given the history of breast cancer, metastatic disease cannot be completely excluded but is considered less likely. 3. Gas in the soft tissues of the left breast including a 2.8 x 1.9 cm collection of gas and fluid in the medial left breast. Soft tissue gas is not typically expected more than 7-10 days after surgery so this finding is at the outer limits of expected normal postoperative appearance. 4. 3 mm right middle lobe pulmonary nodule. Likely related to the above described process. Follow-up recommended to ensure resolution. 5. High attenuation material in the gallbladder lumen, incompletely visualized. No history of recent contrast infused study to suggest vicarious excretion of contrast. This may represent dense gallbladder sludge although attenuation is higher than typically seen in the setting of sludge. Right upper quadrant ultrasound could be used to further evaluate as clinically warranted. 6. Aortic Atherosclerosis (ICD10-I70.0) and Emphysema (ICD10-J43.9). Electronically Signed   By: Misty Stanley M.D.   On: 07/28/2022 15:14   DG Chest 2 View  Result Date: 07/28/2022 CLINICAL DATA:  Shortness of breath. EXAM: CHEST - 2 VIEW COMPARISON:  July 24, 2022. FINDINGS: The heart size and mediastinal contours are within normal limits. Both lungs are clear. Hyperexpansion of the lungs is again noted. The visualized skeletal structures are unremarkable. IMPRESSION: No active cardiopulmonary disease. Electronically Signed   By: Marijo Conception  M.D.   On: 07/28/2022 12:00   DG Chest 2 View  Result Date: 07/24/2022 CLINICAL DATA:  Cough, shortness of breath, and hypoxia. Status post left breast lumpectomy and resection of lymph node within the left upper outer breast adjacent axillary region 1 week ago for breast cancer. EXAM: CHEST - 2 VIEW COMPARISON:  Chest two views 12/11/2019 FINDINGS: Cardiac silhouette and mediastinal contours are within normal limits. Mild calcification within aortic arch. Flattening of the diaphragms and mild-to-moderate hyperinflation, unchanged. Increased lucencies within the bilateral lungs suggesting emphysematous changes. No acute airspace opacity. No pleural effusion or pneumothorax. Mild-to-moderate multilevel degenerative disc changes of the thoracic spine. IMPRESSION: 1. No acute cardiopulmonary process. 2. Mild-to-moderate hyperinflation and findings compatible with emphysematous changes, unchanged from prior. Electronically Signed   By: Yvonne Kendall M.D.   On: 07/24/2022 09:49   MM Breast Surgical Specimen  Result Date: 07/17/2022 CLINICAL DATA:  Status post RF ID tag LEFT breast lumpectomy biopsy-proven invasive mammary carcinoma at the COIL clip. EXAM: SPECIMEN RADIOGRAPH OF THE LEFT BREAST COMPARISON:  Previous exam(s). FINDINGS: Status post excision of the LEFT breast. The RF ID tag and COIL shaped clip are present within the specimen. IMPRESSION: Specimen radiograph of the LEFT breast. Electronically Signed   By: Valentino Saxon M.D.   On: 07/17/2022 15:31  NM Sentinel Node Inj-No Rpt (Breast)  Result Date: 07/17/2022 Sulfur Colloid was injected by the Nuclear Medicine Technologist for sentinel lymph node localization.   MM LT RADIO FREQUENCY TAG LOC MAMMO GUIDE  Result Date: 07/11/2022 CLINICAL DATA:  Status post stereotactic guided biopsy demonstrating invasive mammary carcinoma. (COIL clip) EXAM: MAMMOGRAPHIC GUIDED RADIOFREQUENCY DEVICE LOCALIZATION OF THE LEFT BREAST COMPARISON:  Previous  exam(s). FINDINGS: Patient presents for radiofrequency device localization prior to lumpectomy. I met with the patient and we discussed the procedure of radiofrequency device localization including benefits and alternatives. We discussed the high likelihood of a successful procedure. We discussed the risks of the procedure including infection, bleeding, tissue injury and further surgery. Informed, written  consent was given. The usual time-out protocol was performed immediately prior to the procedure. Using mammographic guidance, sterile technique, 1% lidocaine as local anesthesia, a radiofrequency ID tag ([id number]) was used to localize the COIL clip using a lateral approach. The follow-up mammogram images confirm that the RF device is in the expected location and are marked for Dr. Hampton Abbot. RF ID tag is located approximately 4 mm posterior to the COIL clip along the posterior margin of the biopsy changes The patient tolerated the procedure well and was released from the [place]. IMPRESSION: Radiofrequency device localization of the LEFT breast. No apparent complications. Electronically Signed   By: Valentino Saxon M.D.   On: 07/11/2022 16:13

## 2022-08-08 NOTE — Progress Notes (Signed)
Oncotype Dx order [id number] submitted online on path [phone]

## 2022-08-09 ENCOUNTER — Other Ambulatory Visit: Payer: Self-pay | Admitting: Family Medicine

## 2022-08-09 ENCOUNTER — Ambulatory Visit: Payer: Self-pay | Admitting: *Deleted

## 2022-08-09 NOTE — Patient Outreach (Signed)
  Care Coordination   Follow Up Visit Note   08/09/2022 Name: Alexandria [name] MRN: [id number] DOB: June 23, 1956  Alexandria [name] is a 66 y.o. year old female who sees Gwyneth Sprout, FNP for primary care. I spoke with  Alexandria [name] by phone today.  What matters to the patients health and wellness today?  Hospitalized 11/26-11/30 for COPD.     Goals Addressed             This Visit's Progress    management of COPD       Care Coordination Interventions: Provided patient with basic written and verbal COPD education on self care/management/and exacerbation prevention Advised patient to track and manage COPD triggers Provided instruction about proper use of medications used for management of COPD including inhalers Advised patient to self assesses COPD action plan zone and make appointment with provider if in the yellow zone for 48 hours without improvement      New diagnosis of breast cancer   On track    Care Coordination Interventions: Assessed patient understanding of cancer diagnosis and recommended treatment plan Reviewed upcoming provider appointments and treatment appointments Assessed available transportation to appointments and treatments. Has consistent/reliable transportation: Yes Assessed support system. Has consistent/reliable family or other support: Yes Reviewed appointment dates: hospital follow up on 12/11, genetic counseling on 12/14, oncology for radiation mapping on 12/20 Discussed post op recovery, state she has done well with surgery, recovering now from COPD exacerbation.   Discussed post op plan - will have better idea of plan after follow up appointments.  Currently, planning radiation therapy Advised of contact person for oncology/nurse navigator          SDOH assessments and interventions completed:  No     Care Coordination Interventions:  Yes, provided   Follow up plan: Follow up call scheduled for 1/2    Encounter Outcome:  Pt. Visit Completed   Valente David, RN, MSN, [place] Care Management Care Management Coordinator [phone]

## 2022-08-09 NOTE — Progress Notes (Unsigned)
I,Mikya Don R Leita Lindbloom,acting as a Education administrator for Gwyneth Sprout, FNP.,have documented all relevant documentation on the behalf of Gwyneth Sprout, FNP,as directed by  Gwyneth Sprout, FNP while in the presence of Gwyneth Sprout, FNP.   Established patient visit   Patient: Alexandria [name]   DOB: Jul 05, 1956   66 y.o. Female  MRN: [id number] Visit Date: 08/12/2022  Today's healthcare provider: Gwyneth Sprout, FNP  Re Introduced to nurse practitioner role and practice setting.  All questions answered.  Discussed provider/patient relationship and expectations.  Subjective    HPI  Follow up Hospitalization  Patient was admitted to  [facility] on 07/28/22 and discharged on 08/01/22. She was treated for hypoxia, COPD. Treatment for this included supplemental oxygen and breathing treatments. Telephone follow up was done on 08/02/22 She reports excellent compliance with treatment. She reports this condition is resolved.  ----------------------------------------------------------------------------------------- -   Medications: Outpatient Medications Prior to Visit  Medication Sig   acetaminophen (TYLENOL) 500 MG tablet Take 1,000 mg by mouth every 6 (six) hours as needed for mild pain.   albuterol (VENTOLIN HFA) 108 (90 Base) MCG/ACT inhaler Inhale 1-2 puffs into the lungs every 4 (four) hours as needed for wheezing or shortness of breath.   amLODipine (NORVASC) 10 MG tablet Take 1 tablet (10 mg total) by mouth daily.   benzonatate (TESSALON) 100 MG capsule Take 1 capsule (100 mg total) by mouth 3 (three) times daily as needed.   cetirizine (ZYRTEC) 10 MG tablet Take 1 tablet (10 mg total) by mouth daily.   clonazePAM (KLONOPIN) 0.5 MG tablet TAKE ONE-HALF TABLET BY MOUTH 3  TIMES DAILY AS NEEDED FOR  ANXIETY   famotidine (PEPCID) 20 MG tablet Take 20 mg by mouth as needed for heartburn.   hydrochlorothiazide (HYDRODIURIL) 25 MG tablet TAKE 1 TABLET BY MOUTH DAILY   letrozole (FEMARA) 2.5 MG tablet Take 1  tablet (2.5 mg total) by mouth daily. Start one week after completion of radiation   loperamide (IMODIUM) 2 MG capsule TAKE 1 CAPSULE BY MOUTH 4 TIMES  DAILY AS NEEDED FOR DIARRHEA OR  LOOSE STOOLS   metFORMIN (GLUCOPHAGE-XR) 750 MG 24 hr tablet TAKE 1 TABLET BY MOUTH DAILY  WITH BREAKFAST   MULTIPLE VITAMIN PO Take 1 tablet by mouth daily. Reported on 03/13/2016   oxyCODONE (OXY IR/ROXICODONE) 5 MG immediate release tablet Take 1 tablet (5 mg total) by mouth every 4 (four) hours as needed for severe pain.   rosuvastatin (CRESTOR) 40 MG tablet Take 1 tablet (40 mg total) by mouth daily.   Spacer/Aero-Holding Chambers (AEROCHAMBER MV) inhaler Use as instructed   TRELEGY ELLIPTA 100-62.5-25 MCG/ACT AEPB USE 1 INHALATION BY MOUTH DAILY   ibuprofen (ADVIL) 600 MG tablet Take 1 tablet (600 mg total) by mouth every 8 (eight) hours as needed for moderate pain. (Patient not taking: Reported on 08/12/2022)   ondansetron (ZOFRAN-ODT) 4 MG disintegrating tablet Take 1 tablet (4 mg total) by mouth every 8 (eight) hours as needed for nausea or vomiting. (Patient not taking: Reported on 08/12/2022)   No facility-administered medications prior to visit.    Review of Systems    Objective    BP 125/66 (BP Location: Right Arm, Patient Position: Sitting, Cuff Size: Normal)   Pulse 81   Temp 98.6 F (37 C) (Oral)   Wt 161 lb 1.6 oz (73.1 kg)   SpO2 100%   BMI 25.23 kg/m   Physical Exam Vitals and nursing note reviewed.  Constitutional:      General: She is not in acute distress.    Appearance: Normal appearance. She is overweight. She is not ill-appearing, toxic-appearing or diaphoretic.  HENT:     Head: Normocephalic and atraumatic.  Cardiovascular:     Rate and Rhythm: Normal rate and regular rhythm.     Pulses: Normal pulses.     Heart sounds: Normal heart sounds. No murmur heard.    No friction rub. No gallop.  Pulmonary:     Effort: Pulmonary effort is normal. No respiratory distress.      Breath sounds: Normal breath sounds. No stridor. No wheezing, rhonchi or rales.  Chest:     Chest wall: No tenderness.  Abdominal:     General: Bowel sounds are normal.     Palpations: Abdomen is soft.  Musculoskeletal:        General: No swelling, tenderness, deformity or signs of injury. Normal range of motion.     Right lower leg: No edema.     Left lower leg: No edema.  Skin:    General: Skin is warm and dry.     Capillary Refill: Capillary refill takes less than 2 seconds.     Coloration: Skin is not jaundiced or pale.     Findings: No bruising, erythema, lesion or rash.  Neurological:     General: No focal deficit present.     Mental Status: She is alert and oriented to person, place, and time. Mental status is at baseline.     Cranial Nerves: No cranial nerve deficit.     Sensory: No sensory deficit.     Motor: No weakness.     Coordination: Coordination normal.  Psychiatric:        Mood and Affect: Mood is depressed. Affect is tearful.        Behavior: Behavior normal.        Thought Content: Thought content normal. Thought content does not include homicidal or suicidal ideation. Thought content does not include homicidal or suicidal plan.        Judgment: Judgment normal.     No results found for any visits on 08/12/22.  Assessment & Plan     Problem List Items Addressed This Visit       Cardiovascular and Mediastinum   Hypertension associated with diabetes ([place])    Chronic, stable At goal <130/<80 Continue Norvasc 10 mg, HCTZ 25 mg Will get urine micro today and foot exam completed 10/10 bilateral- no concerns/complaints      Relevant Orders   Urine Microalbumin w/creat. ratio     Respiratory   COPD with hypoxia ([place]) - Primary    S/p admission 11/26-11/30; no longer on oxygen. No advantageous breath sounds. Remains compliant with treatment despite continued tobacco use disorder- how pt is coping with her health concerns, her daughter's health concerns, and  no further family/friends to assist Continue PRN albuterol, tessalon PRN, trelegy 1/day      Relevant Orders   AMB Referral to Chronic Care Management Services     Other   Breast cancer, left ([place])    Chronic, dx in 06/2022- plans to complete mapping to start radiation Reports questions regarding next shingles vaccine- encouraged to reach out to oncology team Endorses depression given acute and chronic health complaints; notes little support at home from her daughter, who also has chronic health concerns Denies use of additional medication to assist; ex SSRI Continue prn klonopin Referral to CCM for further assistance  Relevant Orders   AMB Referral to Chronic Care Management Services   Return if symptoms worsen or fail to improve.     Vonna Kotyk, FNP, have reviewed all documentation for this visit. The documentation on 08/12/22 for the exam, diagnosis, procedures, and orders are all accurate and complete.  Gwyneth Sprout, [place] [phone] (phone) [phone] (fax)  [place]

## 2022-08-09 NOTE — Telephone Encounter (Signed)
Requested medications are due for refill today.  Unsure  Requested medications are on the active medications list.  yes  Last refill. 12/03/2021   Future visit scheduled.   yes  Notes to clinic.  Medication listed as historical.    Requested Prescriptions  Pending Prescriptions Disp Refills   metFORMIN (GLUCOPHAGE-XR) 750 MG 24 hr tablet [Pharmacy Med Name: metFORMIN HCl ER 750 MG Oral Tablet Extended Release 24 Hour] 100 tablet 2    Sig: TAKE 1 TABLET BY MOUTH DAILY  WITH BREAKFAST     Endocrinology:  Diabetes - Biguanides Failed - 08/09/2022  7:15 AM      Failed - B12 Level in normal range and within 720 days    No results found for: "VITAMINB12"       Failed - CBC within normal limits and completed in the last 12 months    WBC  Date Value Ref Range Status  08/01/2022 13.2 (H) 4.0 - 10.5 K/uL Final   RBC  Date Value Ref Range Status  08/01/2022 5.10 3.87 - 5.11 MIL/uL Final   Hemoglobin  Date Value Ref Range Status  08/01/2022 14.6 12.0 - 15.0 g/dL Final  04/11/2022 15.7 11.1 - 15.9 g/dL Final   HCT  Date Value Ref Range Status  08/01/2022 43.3 36.0 - 46.0 % Final   Hematocrit  Date Value Ref Range Status  04/11/2022 46.7 (H) 34.0 - 46.6 % Final   MCHC  Date Value Ref Range Status  08/01/2022 33.7 30.0 - 36.0 g/dL Final   [facility]  Date Value Ref Range Status  08/01/2022 28.6 26.0 - 34.0 pg Final   MCV  Date Value Ref Range Status  08/01/2022 84.9 80.0 - 100.0 fL Final  04/11/2022 88 79 - 97 fL Final  12/25/2014 88 80 - 100 fL Final   No results found for: "PLTCOUNTKUC", "LABPLAT", "POCPLA" RDW  Date Value Ref Range Status  08/01/2022 13.3 11.5 - 15.5 % Final  04/11/2022 13.3 11.7 - 15.4 % Final  12/25/2014 13.8 11.5 - 14.5 % Final         Passed - Cr in normal range and within 360 days    Creatinine  Date Value Ref Range Status  12/26/2014 0.53 mg/dL Final    Comment:    0.44-1.00 NOTE: New Reference Range  11/08/14    Creatinine, Ser  Date  Value Ref Range Status  08/01/2022 0.55 0.44 - 1.00 mg/dL Final         Passed - HBA1C is between 0 and 7.9 and within 180 days    Hgb A1c MFr Bld  Date Value Ref Range Status  04/11/2022 6.1 (H) 4.8 - 5.6 % Final    Comment:             Prediabetes: 5.7 - 6.4          Diabetes: >6.4          Glycemic control for adults with diabetes: <7.0          Passed - eGFR in normal range and within 360 days    EGFR (African American)  Date Value Ref Range Status  12/26/2014 >60  Final   GFR calc Af Amer  Date Value Ref Range Status  04/03/2020 97 >59 mL/min/1.73 Final    Comment:    **[facility] currently reports eGFR in compliance with the current**   recommendations of the [employer]. [facility] will   update reporting as new guidelines are published from the NKF-ASN  Task force.    EGFR (Non-African Amer.)  Date Value Ref Range Status  12/26/2014 >60  Final    Comment:    eGFR values <67m/min/1.73 m2 may be an indication of chronic kidney disease (CKD). Calculated eGFR is useful in patients with stable renal function. The eGFR calculation will not be reliable in acutely ill patients when serum creatinine is changing rapidly. It is not useful in patients on dialysis. The eGFR calculation may not be applicable to patients at the low and high extremes of body sizes, pregnant women, and vegetarians.    GFR, Estimated  Date Value Ref Range Status  08/01/2022 >60 >60 mL/min Final    Comment:    (NOTE) Calculated using the CKD-EPI Creatinine Equation (2021)    eGFR  Date Value Ref Range Status  04/11/2022 95 >59 mL/min/1.73 Final         Passed - Valid encounter within last 6 months    Recent Outpatient Visits           4 months [facility]   [facility]   1 year [facility]    [facility]   1 year ago Annual physical exam   [facility]       Future Appointments             In 3 days [place]

## 2022-08-12 ENCOUNTER — Ambulatory Visit (INDEPENDENT_AMBULATORY_CARE_PROVIDER_SITE_OTHER): Payer: Medicare Other | Admitting: Family Medicine

## 2022-08-12 ENCOUNTER — Encounter: Payer: Self-pay | Admitting: Family Medicine

## 2022-08-12 VITALS — BP 125/66 | HR 81 | Temp 98.6°F | Wt 161.1 lb

## 2022-08-12 DIAGNOSIS — C50912 Malignant neoplasm of unspecified site of left female breast: Secondary | ICD-10-CM | POA: Diagnosis not present

## 2022-08-12 DIAGNOSIS — I152 Hypertension secondary to endocrine disorders: Secondary | ICD-10-CM

## 2022-08-12 DIAGNOSIS — Z170 Estrogen receptor positive status [ER+]: Secondary | ICD-10-CM | POA: Diagnosis not present

## 2022-08-12 DIAGNOSIS — J449 Chronic obstructive pulmonary disease, unspecified: Secondary | ICD-10-CM | POA: Insufficient documentation

## 2022-08-12 DIAGNOSIS — E1159 Type 2 diabetes mellitus with other circulatory complications: Secondary | ICD-10-CM

## 2022-08-12 DIAGNOSIS — J439 Emphysema, unspecified: Secondary | ICD-10-CM | POA: Insufficient documentation

## 2022-08-12 NOTE — Assessment & Plan Note (Addendum)
Chronic, stable At goal <130/<80 Continue Norvasc 10 mg, HCTZ 25 mg Will get urine micro today and foot exam completed 10/10 bilateral- no concerns/complaints

## 2022-08-12 NOTE — Assessment & Plan Note (Signed)
S/p admission 11/26-11/30; no longer on oxygen. No advantageous breath sounds. Remains compliant with treatment despite continued tobacco use disorder- how pt is coping with her health concerns, her daughter's health concerns, and no further family/friends to assist Continue PRN albuterol, tessalon PRN, trelegy 1/day

## 2022-08-12 NOTE — Assessment & Plan Note (Signed)
Chronic, dx in 06/2022- plans to complete mapping to start radiation Reports questions regarding next shingles vaccine- encouraged to reach out to oncology team Endorses depression given acute and chronic health complaints; notes little support at home from her daughter, who also has chronic health concerns Denies use of additional medication to assist; ex SSRI Continue prn klonopin Referral to CCM for further assistance

## 2022-08-13 LAB — MICROALBUMIN / CREATININE URINE RATIO
Creatinine, Urine: 100.6 mg/dL
Microalb/Creat Ratio: 6 mg/g creat (ref 0–29)
Microalbumin, Urine: 6.5 ug/mL

## 2022-08-13 NOTE — Progress Notes (Signed)
Normal urine micro. Repeat annually

## 2022-08-15 ENCOUNTER — Encounter: Payer: Self-pay | Admitting: *Deleted

## 2022-08-15 ENCOUNTER — Encounter: Payer: Self-pay | Admitting: Internal Medicine

## 2022-08-15 ENCOUNTER — Ambulatory Visit
Admission: RE | Admit: 2022-08-15 | Discharge: 2022-08-15 | Disposition: A | Discharge status: Home / Self Care | Payer: Medicare Other | Source: Ambulatory Visit | Attending: Radiation Oncology | Admitting: Radiation Oncology

## 2022-08-15 DIAGNOSIS — Z510 Encounter for antineoplastic radiation therapy: Secondary | ICD-10-CM | POA: Diagnosis not present

## 2022-08-15 DIAGNOSIS — C50412 Malignant neoplasm of upper-outer quadrant of left female breast: Secondary | ICD-10-CM | POA: Diagnosis not present

## 2022-08-15 DIAGNOSIS — Z170 Estrogen receptor positive status [ER+]: Secondary | ICD-10-CM | POA: Diagnosis not present

## 2022-08-16 ENCOUNTER — Other Ambulatory Visit: Payer: Self-pay | Admitting: *Deleted

## 2022-08-16 DIAGNOSIS — C50412 Malignant neoplasm of upper-outer quadrant of left female breast: Secondary | ICD-10-CM

## 2022-08-17 ENCOUNTER — Other Ambulatory Visit: Payer: Self-pay | Admitting: Family Medicine

## 2022-08-17 DIAGNOSIS — F419 Anxiety disorder, unspecified: Secondary | ICD-10-CM

## 2022-08-19 NOTE — Telephone Encounter (Signed)
Requested medication (s) are due for refill today: routing for review  Requested medication (s) are on the active medication list: yes  Last refill:  07/29/22  Future visit scheduled: yes  Notes to clinic:  Unable to refill per protocol, cannot delegate.      Requested Prescriptions  Pending Prescriptions Disp Refills   clonazePAM (KLONOPIN) 0.5 MG tablet [Pharmacy Med Name: clonazePAM 0.5 MG Oral Tablet] 90 tablet     Sig: TAKE ONE-HALF TABLET BY MOUTH 3  TIMES DAILY AS NEEDED FOR  ANXIETY     Not Delegated - Psychiatry: Anxiolytics/Hypnotics 2 Failed - 08/17/2022  5:37 PM      Failed - This refill cannot be delegated      Failed - Urine Drug Screen completed in last 360 days      Passed - Patient is not pregnant      Passed - Valid encounter within last 6 months    Recent Outpatient Visits           1 week ago COPD with hypoxia [facility])   [facility] Gwyneth Sprout, FNP   4 months ago Annual physical exam   [facility] Gwyneth Sprout, FNP   1 year ago Change in stool   [facility] Gwyneth Sprout, FNP   1 year ago [place] Gwyneth Sprout, FNP   1 year ago [facility] Gwyneth Sprout, [place]

## 2022-08-21 ENCOUNTER — Other Ambulatory Visit: Payer: Medicare Other

## 2022-08-21 ENCOUNTER — Telehealth: Payer: Self-pay

## 2022-08-21 ENCOUNTER — Encounter: Payer: Medicare Other | Admitting: Licensed Clinical Social Worker

## 2022-08-21 DIAGNOSIS — Z510 Encounter for antineoplastic radiation therapy: Secondary | ICD-10-CM | POA: Diagnosis not present

## 2022-08-21 DIAGNOSIS — Z170 Estrogen receptor positive status [ER+]: Secondary | ICD-10-CM | POA: Diagnosis not present

## 2022-08-21 DIAGNOSIS — C50412 Malignant neoplasm of upper-outer quadrant of left female breast: Secondary | ICD-10-CM | POA: Diagnosis not present

## 2022-08-22 ENCOUNTER — Ambulatory Visit
Admission: RE | Admit: 2022-08-22 | Discharge: 2022-08-22 | Disposition: A | Discharge status: Home / Self Care | Payer: Medicare Other | Source: Ambulatory Visit | Attending: Radiation Oncology | Admitting: Radiation Oncology

## 2022-08-22 DIAGNOSIS — C50412 Malignant neoplasm of upper-outer quadrant of left female breast: Secondary | ICD-10-CM | POA: Diagnosis not present

## 2022-08-22 DIAGNOSIS — Z170 Estrogen receptor positive status [ER+]: Secondary | ICD-10-CM | POA: Diagnosis not present

## 2022-08-22 DIAGNOSIS — Z510 Encounter for antineoplastic radiation therapy: Secondary | ICD-10-CM | POA: Diagnosis not present

## 2022-08-22 IMAGING — CR DG SHOULDER 2+V*L*
3 series · 3 of 3 positions shown · non-contrast
Comparison: None.

CLINICAL DATA: Pain left shoulder

EXAM:
LEFT SHOULDER - 2+ VIEW

[shoulder grashey]
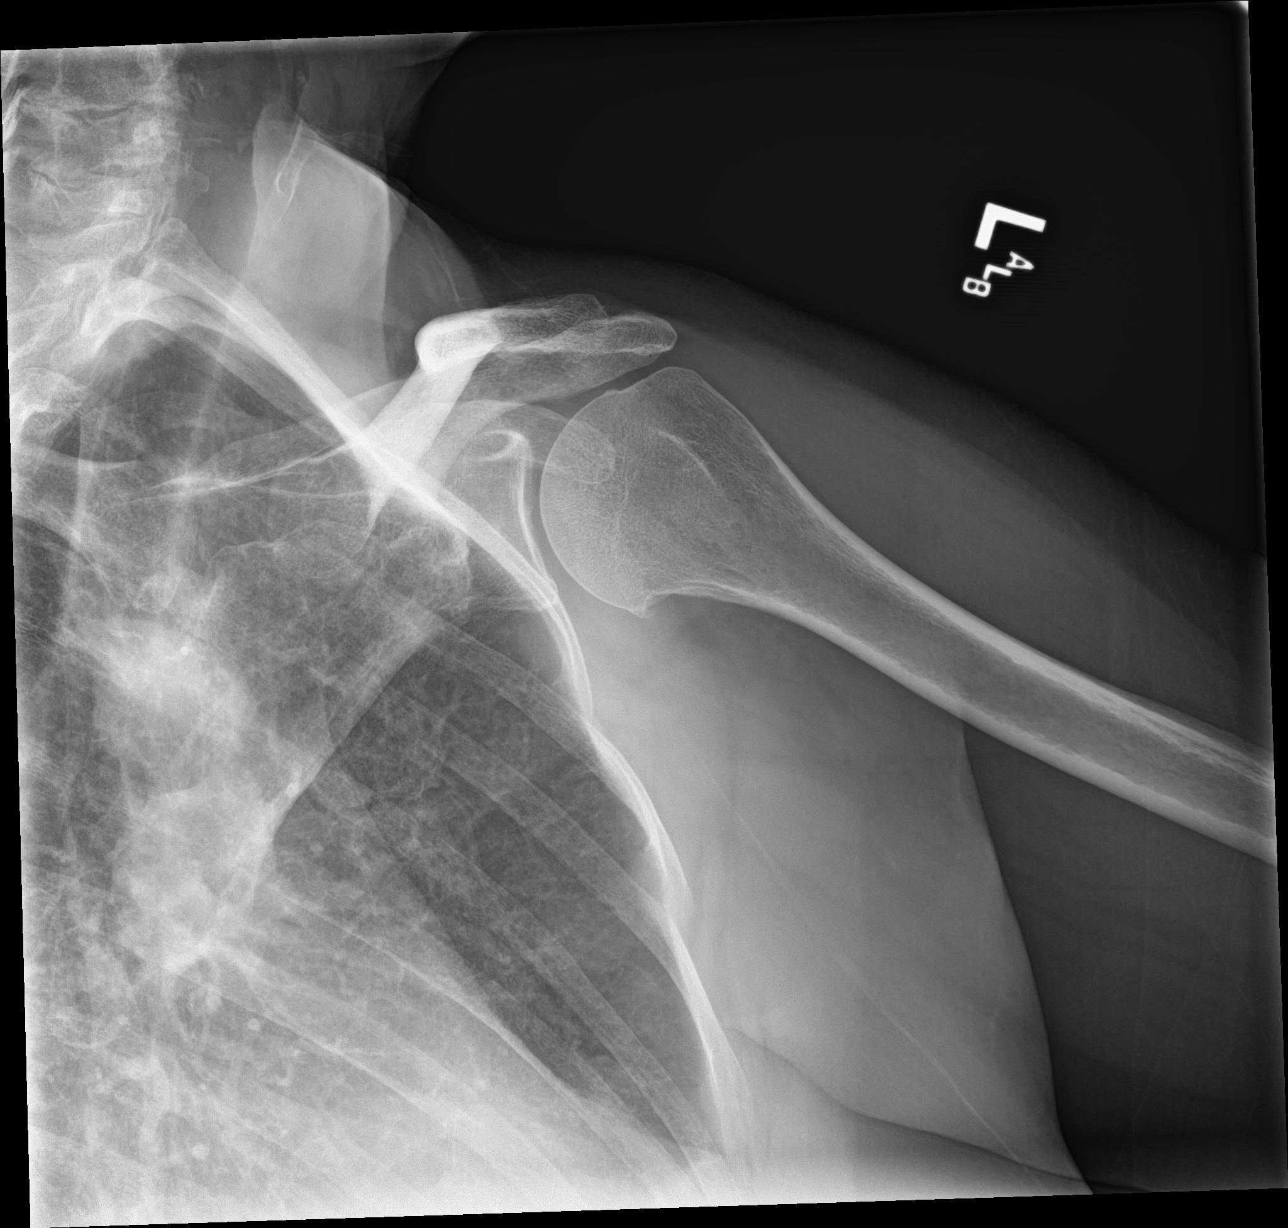

[shoulder y view]
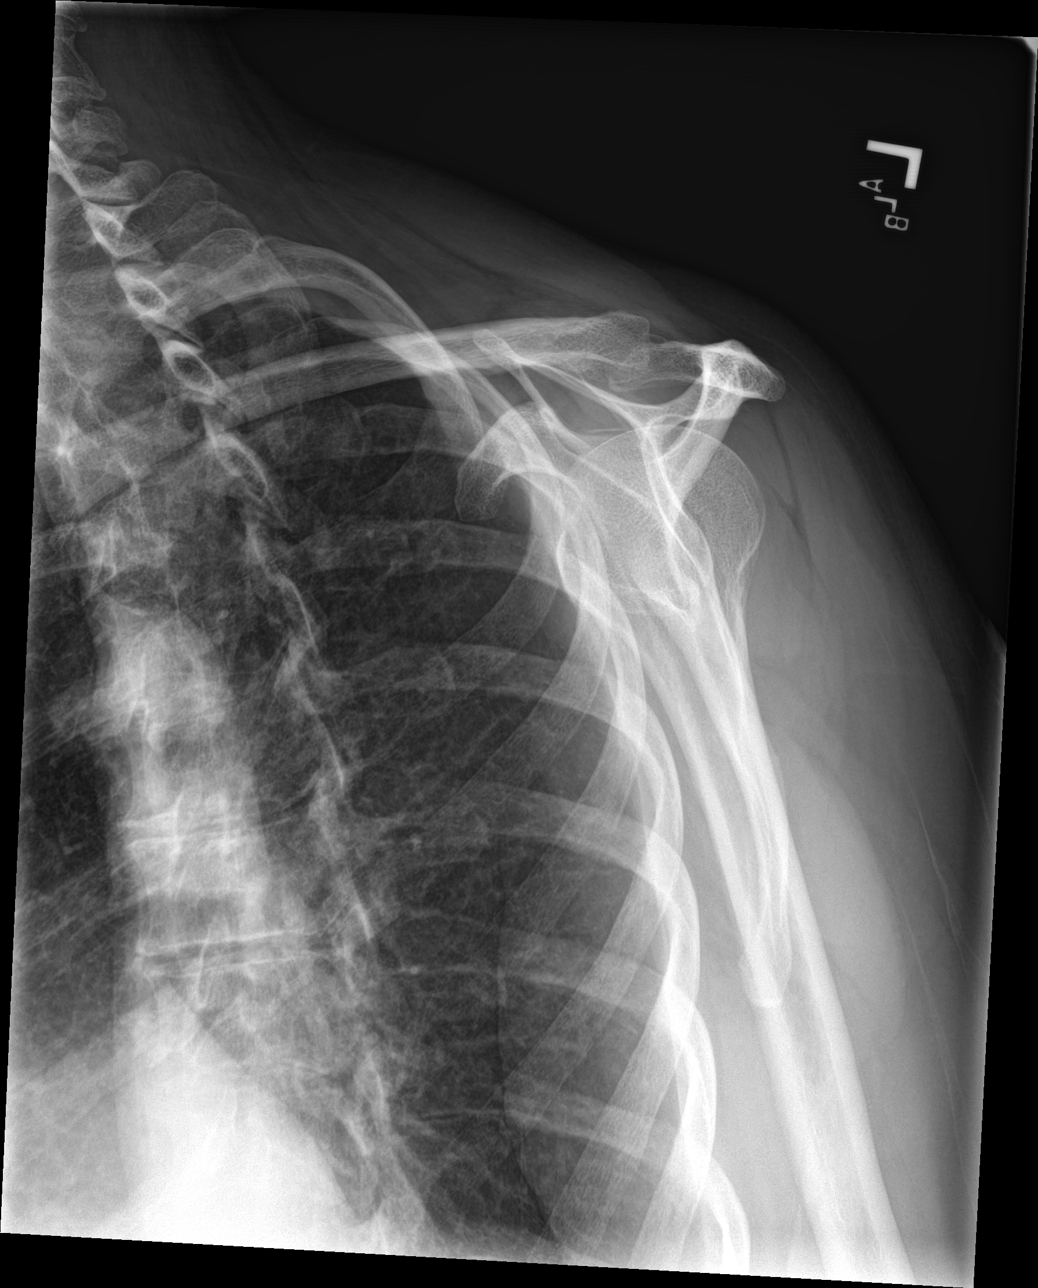

[shoulder axial]
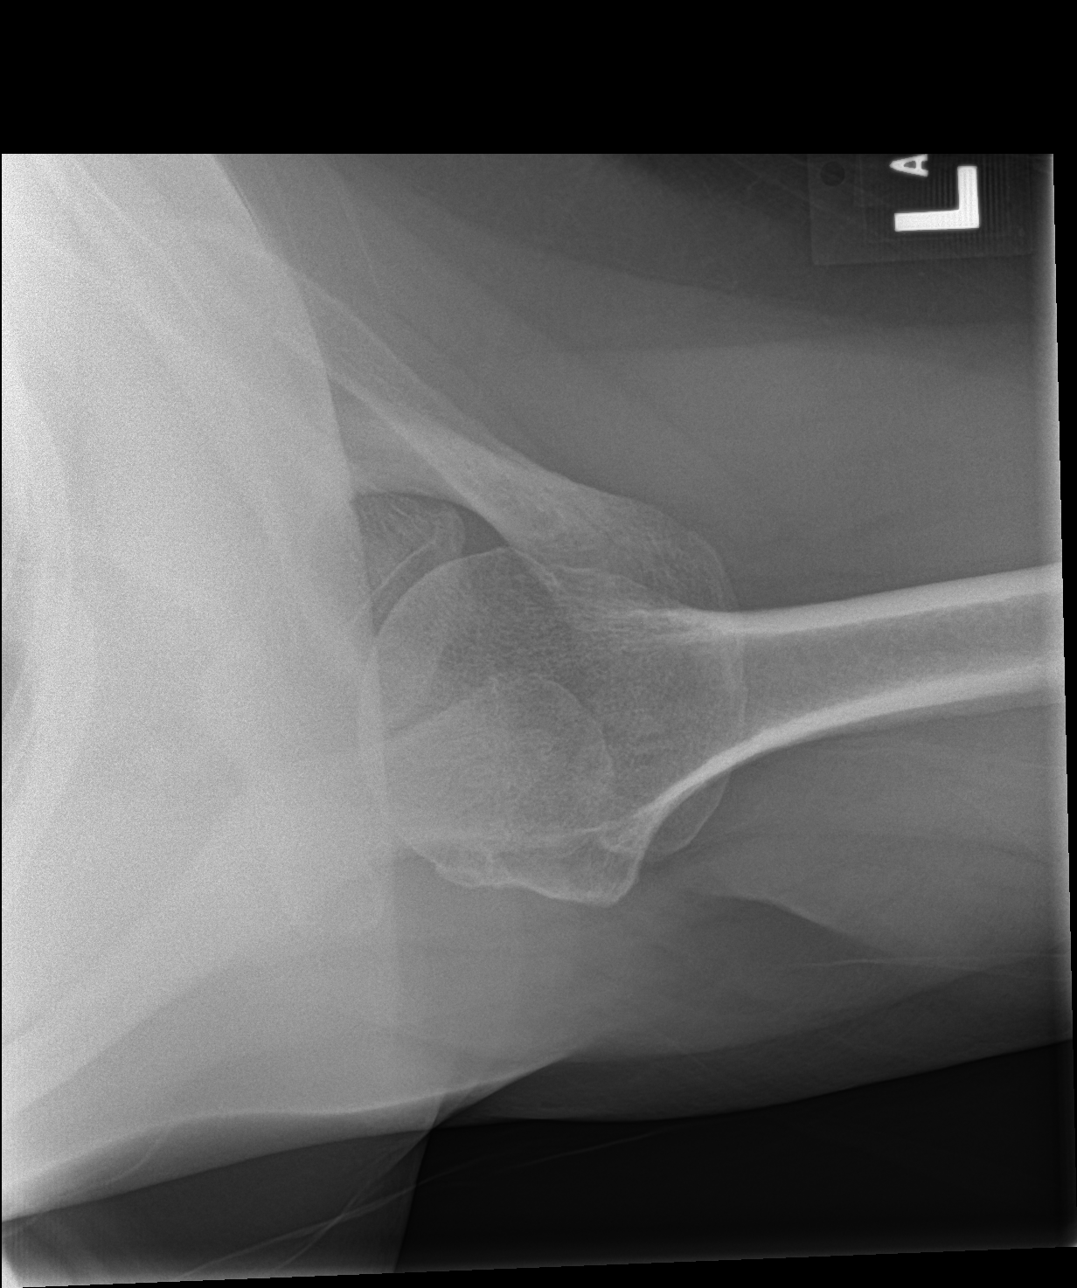

[3 of 3 positions shown; findings below may reference images not displayed]

FINDINGS: No fracture or dislocation is seen. There are no abnormal soft
tissue calcifications. Small bony spurs seen in the inferior aspect
of head of the humerus.
IMPRESSION: No fracture or dislocation is seen in the left shoulder.
Degenerative changes with small bony spurs are noted in the inferior
aspect of proximal humerus.

## 2022-08-27 ENCOUNTER — Telehealth: Payer: Self-pay

## 2022-08-27 ENCOUNTER — Other Ambulatory Visit: Payer: Self-pay

## 2022-08-27 ENCOUNTER — Ambulatory Visit
Admission: RE | Admit: 2022-08-27 | Discharge: 2022-08-27 | Disposition: A | Discharge status: Home / Self Care | Payer: Medicare Other | Source: Ambulatory Visit | Attending: Radiation Oncology | Admitting: Radiation Oncology

## 2022-08-27 DIAGNOSIS — Z510 Encounter for antineoplastic radiation therapy: Secondary | ICD-10-CM | POA: Diagnosis not present

## 2022-08-27 DIAGNOSIS — C50412 Malignant neoplasm of upper-outer quadrant of left female breast: Secondary | ICD-10-CM | POA: Diagnosis not present

## 2022-08-27 DIAGNOSIS — Z170 Estrogen receptor positive status [ER+]: Secondary | ICD-10-CM | POA: Diagnosis not present

## 2022-08-27 LAB — RAD ONC ARIA SESSION SUMMARY
Course Elapsed Days: 0
Plan Fractions Treated to Date: 1
Plan Prescribed Dose Per Fraction: 2.66 Gy
Plan Total Fractions Prescribed: 16
Plan Total Prescribed Dose: 42.56 Gy
Reference Point Dosage Given to Date: 2.66 Gy
Reference Point Session Dosage Given: 2.66 Gy
Session Number: 1

## 2022-08-28 ENCOUNTER — Ambulatory Visit
Admission: RE | Admit: 2022-08-28 | Discharge: 2022-08-28 | Disposition: A | Discharge status: Home / Self Care | Payer: Medicare Other | Source: Ambulatory Visit | Attending: Radiation Oncology | Admitting: Radiation Oncology

## 2022-08-28 ENCOUNTER — Other Ambulatory Visit: Payer: Self-pay

## 2022-08-28 DIAGNOSIS — Z170 Estrogen receptor positive status [ER+]: Secondary | ICD-10-CM | POA: Diagnosis not present

## 2022-08-28 DIAGNOSIS — Z510 Encounter for antineoplastic radiation therapy: Secondary | ICD-10-CM | POA: Diagnosis not present

## 2022-08-28 DIAGNOSIS — C50412 Malignant neoplasm of upper-outer quadrant of left female breast: Secondary | ICD-10-CM | POA: Diagnosis not present

## 2022-08-28 LAB — RAD ONC ARIA SESSION SUMMARY
Course Elapsed Days: 1
Plan Fractions Treated to Date: 2
Plan Prescribed Dose Per Fraction: 2.66 Gy
Plan Total Fractions Prescribed: 16
Plan Total Prescribed Dose: 42.56 Gy
Reference Point Dosage Given to Date: 5.32 Gy
Reference Point Session Dosage Given: 2.66 Gy
Session Number: 2

## 2022-08-29 ENCOUNTER — Telehealth: Payer: Medicare Other

## 2022-08-29 ENCOUNTER — Ambulatory Visit (INDEPENDENT_AMBULATORY_CARE_PROVIDER_SITE_OTHER): Payer: Medicare Other

## 2022-08-29 ENCOUNTER — Other Ambulatory Visit: Payer: Self-pay

## 2022-08-29 ENCOUNTER — Ambulatory Visit
Admission: RE | Admit: 2022-08-29 | Discharge: 2022-08-29 | Disposition: A | Discharge status: Home / Self Care | Payer: Medicare Other | Source: Ambulatory Visit | Attending: Radiation Oncology | Admitting: Radiation Oncology

## 2022-08-29 DIAGNOSIS — Z510 Encounter for antineoplastic radiation therapy: Secondary | ICD-10-CM | POA: Diagnosis not present

## 2022-08-29 DIAGNOSIS — C50912 Malignant neoplasm of unspecified site of left female breast: Secondary | ICD-10-CM

## 2022-08-29 DIAGNOSIS — E119 Type 2 diabetes mellitus without complications: Secondary | ICD-10-CM

## 2022-08-29 DIAGNOSIS — J449 Chronic obstructive pulmonary disease, unspecified: Secondary | ICD-10-CM

## 2022-08-29 DIAGNOSIS — C50412 Malignant neoplasm of upper-outer quadrant of left female breast: Secondary | ICD-10-CM | POA: Diagnosis not present

## 2022-08-29 DIAGNOSIS — Z170 Estrogen receptor positive status [ER+]: Secondary | ICD-10-CM | POA: Diagnosis not present

## 2022-08-29 LAB — RAD ONC ARIA SESSION SUMMARY
Course Elapsed Days: 2
Plan Fractions Treated to Date: 3
Plan Prescribed Dose Per Fraction: 2.66 Gy
Plan Total Fractions Prescribed: 16
Plan Total Prescribed Dose: 42.56 Gy
Reference Point Dosage Given to Date: 7.98 Gy
Reference Point Session Dosage Given: 2.66 Gy
Session Number: 3

## 2022-08-29 NOTE — Chronic Care Management (AMB) (Signed)
Chronic Care Management   CCM RN Visit Note  08/29/2022 Name: Alexandria [name] MRN: [id number] DOB: [birth year]  Subjective: Alexandria [name] is a 66 y.o. year old female who is a primary care patient of Gwyneth Sprout, FNP. The patient was referred to the Chronic Care Management team for assistance with care management needs subsequent to provider initiation of CCM services and plan of care.    Today's Visit:  Engaged with patient by telephone for initial visit.     Goals Addressed             This Visit's Progress    CCM Expected Outcome:  Monitor, Self-Manage and Reduce Symptoms of:       Current Barriers:  Chronic Disease Management support and education needs related to Diabetes  Planned Interventions: Reviewed provider's plan for Diabetes Management. Reviewed medications and discussed importance of medication adherence.  Reports currently taking medications as prescribed. Denies current concerns related to medication management or prescription cost. Agreed to update the care management team with changes. Reports currently not being required to monitor fasting blood glucose. A1C is currently within goal.  Reviewed s/sx of hypoglycemia and hyperglycemia along with recommended interventions. Reports episodes of fatigue that she attributes to radiation treatment. Denies hypoglycemic or hyperglycemic episodes. Discussed nutritional intake and importance of complying with a diabetic diet. Reports doing well with diet adherence. Notes decreased appetite since starting radiation within the last week. Thorough discussion regarding meal and snack options to increase intake of protein and maintain optimal glycemic levels.  Discussed importance of completing recommended DM preventive care. Advised to complete foot care and annual eye exams as recommended. Discussed importance of completing ordered labs as prescribed.  Assessed social determinant of health barriers.   Lab Results  Component Value Date    HGBA1C 6.1 (H) 04/11/2022    Symptom Management: Take medications as prescribed   Attend all scheduled provider appointments Call pharmacy for medication refills 3-7 days in advance of running out of medications Keep appointment with eye doctor Check feet daily for cuts, sores or redness Wash and dry feet carefully every day Wear comfortable, cotton socks Wear comfortable, well-fitting shoes Continue reading nutrition labels and adhering to recommended diabetic diet. Call provider office for new concerns or questions   Follow Up Plan:  Will follow up next month              Goal: CCM (Breast Cancer) Expected Outcome:  Monitor, Self-Manage And Reduce Symptoms of Breast Cancer Treatments       Current Barriers:  Chronic Disease Management support and education needs related to Breast Cancer  Planned Interventions: Reviewed plan for treatment of malignant neoplasm of the left breast. Currently plan is to complete daily radiation therapy with last treatment scheduled for 09/25/21. Confirms starting radiation therapy on 08/22/22. Reports increased fatigue but overall reports tolerating treatment well.  Discussed ability to complete ADLs and care coordination needs. Reports currently completing all ADL and IADLs independently. Reports her daughter has been available to assist in the home since start of radiation therapy. Declined current need for in home or care coordination or in home resources. Agreed to keep the care management team updated if this changes or if her functional status declines. Reviewed pending appointments. Will continue daily radiation unless experiencing complications. Pending a mammogram on 09/16/21. Pending follow up with the Oncology team on 09/20/21. Declined current need for transportation assistance. Agreed to update the care management team if this changes. Discussed symptoms. Denies  exacerbations since hospital admission on 07/28/22. Currently experiencing a  productive cough with clear sputum. Provided information regarding s/sx of respiratory infection and indication for notifying a provider. Reports symptoms have been controlled with prescribed inhalers. Advised to continue taking needed precautions to prevent exacerbation.  Reviewed worsening symptoms and indications for seeking medical attention   Symptom Management: Attend all provider appointments Attend daily radiation treatments as scheduled Follow activity restriction as advised by the Oncology treatment team Update the care management team to address care coordination concerns as needed Call provider office for new concerns or questions   Follow Up Plan:  Will follow up next month       Goal: CCM (COPD) Expected Outcome:  Monitor, Self-Manage And Reduce Symptoms of Chronic Obstructive Pulmonary Disease       Current Barriers:  Chronic Disease Management support and education needs related to COPD  Planned Interventions: Reviewed provider's plan for COPD management. Reviewed medications. Reports taking medications and using inhalers as prescribed. Reports receiving a notification from the insurance provider indicating change in coverage for albuterol. Agreed to provide a copy of the documentation to the office. PCP informed and will ensure a new order is submitted for the insurance providers preferred brand. Discussed symptoms. Denies exacerbations since hospital admission on 07/28/22. Currently experiencing a productive cough with clear sputum. Provided information regarding s/sx of respiratory infection and indication for notifying a provider. Reports symptoms have been controlled with prescribed inhalers. Advised to continue taking needed precautions to prevent exacerbation.  Discussed current tobacco use. Attempting to decrease use but reports currently smoking daily. Discussed various options and resources for smoking cessation. Declined offer for counseling. Agreed to update the care  management team if additional resources are needed. Reviewed current action plan. Discussed importance of daily self-assessment. Advised to make an appointment with provider if experiencing moderate symptoms for greater than 48 hours without improvement.  Provided information regarding infection prevention and increased risk r/t COPD. Advised to utilize prevention strategies to reduce risk of respiratory infection. Reviewed worsening symptoms that require immediate medical attention.    Symptom Management: Call pharmacy for medication refills 3-7 days in advance of running out of medications Identify and remove indoor air pollutants Do breathing exercises every day Eliminate symptom triggers at home Practice relaxation or meditation daily Consider plan for smoking cessation Follow rescue plan if symptoms flare-up Call provider office for new concerns or questions   Follow Up Plan:   Will follow up next month              Plan: Will follow up next month   [place] Manager/Chronic Care Management [phone]

## 2022-08-29 NOTE — Plan of Care (Signed)
Chronic Care Management Provider Comprehensive Care Plan    08/29/2022 Name: Alexandria [name] MRN: [id number] DOB: 09/01/1956  Referral to Chronic Care Management (CCM) services was placed by Provider:  Gwyneth Sprout, FNP on Date: 08/12/22.  Chronic Condition 1: DM Provider Assessment and Plan  Diabetes A1c improved; now 6.1%. Continue to recommend balanced, lower carb meals. Smaller meal size, adding snacks. Choosing water as drink of choice and increasing purposeful exercise.    Expected Outcome/Goals Addressed This Visit (Provider CCM goals/Provider Assessment and plan  Goal: CCM (Diabetes) Expected Outcome:  Monitor, Self-Manage And Reduce Symptoms of Diabetes  Symptom Management Condition 1: Take medications as prescribed   Attend all scheduled provider appointments Call pharmacy for medication refills 3-7 days in advance of running out of medications Keep appointment with eye doctor Check feet daily for cuts, sores or redness Wash and dry feet carefully every day Wear comfortable, cotton socks Wear comfortable, well-fitting shoes Continue reading nutrition labels and adhering to recommended diabetic diet. 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 No advantageous breath sounds. Remains compliant with treatment despite continued tobacco use disorder- how pt is coping with her health concerns, her daughter's health concerns, and no further family/friends to assist Continue PRN albuterol, tessalon PRN, trelegy 1/day        Relevant Orders    AMB Referral to Chronic Care Management Services    Expected Outcome/Goals Addressed This Visit (Provider CCM goals/Provider Assessment and plan  Goal: CCM (COPD) Expected Outcome:  Monitor, Self-Manage And Reduce Symptoms of Chronic Obstructive Pulmonary Disease  Symptom Management  Condition 2: Take all medications as prescribed Attend all scheduled provider appointments Call pharmacy for medication refills 3-7 days in advance of running out of medications Identify and remove indoor air pollutants Do breathing exercises every day Eliminate symptom triggers at home Practice relaxation or meditation daily Consider plan for smoking cessation Follow rescue plan if symptoms flare-up Call provider office for new concerns or questions     Chronic Condition 3: Breast Cancer Provider Assessment and Plan: Breast cancer, left ([place])       Chronic, dx in 06/2022- plans to complete mapping to start radiation Reports questions regarding next shingles vaccine- encouraged to reach out to oncology team Endorses depression given acute and chronic health complaints; notes little support at home from her daughter, who also has chronic health concerns Denies use of additional medication to assist; ex SSRI Continue prn klonopin Referral to CCM for further assistance        Relevant Orders    AMB Referral to Chronic Care Management Services   Expected Outcome/Goals Addressed This Visit (Provider CCM goals/Provider Assessment and plan  Goal: CCM (Breast Cancer) Expected Outcome:  Monitor, Self-Manage And Reduce Symptoms of Breast Cancer Treatments  Symptom Management Condition 3: Attend all provider appointments Attend daily radiation treatments as scheduled Follow activity restriction as advised by the Oncology treatment team Update the care management team to address care coordination concerns as needed Call provider office for new concerns or questions    Problem List Patient Active Problem List   Diagnosis Date Noted   COPD with hypoxia ([place]) 08/12/2022   Aromatase inhibitor use 08/08/2022   Acute hypoxemic respiratory failure ([place]) 07/29/2022   COPD with acute exacerbation ([place]) 07/28/2022   Diabetes mellitus (  [place]) 07/28/2022   Hypokalemia 07/28/2022   Breast  cancer, left ([place]) 07/01/2022   Annual physical exam 04/11/2022   Hypertension associated with diabetes ([place]) 04/11/2022   Hyperlipidemia associated with type 2 diabetes mellitus ([place]) 04/11/2022   Need for pneumococcal 20-valent conjugate vaccination 04/11/2022   Need for influenza vaccination 07/17/2021   Encounter for smoking cessation counseling 07/17/2021   Personal history of colonic polyps    Chronic obstructive pulmonary disease ([place]) 05/19/2018   Hx of colonic polyps    History of anxiety 03/10/2015   Tobacco abuse    HTN (hypertension)     Medication Management  Current Outpatient Medications:    acetaminophen (TYLENOL) 500 MG tablet, Take 1,000 mg by mouth every 6 (six) hours as needed for mild pain., Disp: , Rfl:    albuterol (VENTOLIN HFA) 108 (90 Base) MCG/ACT inhaler, Inhale 1-2 puffs into the lungs every 4 (four) hours as needed for wheezing or shortness of breath., Disp: 1 each, Rfl: 0   amLODipine (NORVASC) 10 MG tablet, Take 1 tablet (10 mg total) by mouth daily., Disp: 90 tablet, Rfl: 3   cetirizine (ZYRTEC) 10 MG tablet, Take 1 tablet (10 mg total) by mouth daily., Disp: 30 tablet, Rfl: 11   clonazePAM (KLONOPIN) 0.5 MG tablet, TAKE ONE-HALF TABLET BY MOUTH 3  TIMES DAILY AS NEEDED FOR  ANXIETY, Disp: 90 tablet, Rfl: 0   famotidine (PEPCID) 20 MG tablet, Take 20 mg by mouth as needed for heartburn., Disp: , Rfl:    hydrochlorothiazide (HYDRODIURIL) 25 MG tablet, TAKE 1 TABLET BY MOUTH DAILY, Disp: 60 tablet, Rfl: 5   loperamide (IMODIUM) 2 MG capsule, TAKE 1 CAPSULE BY MOUTH 4 TIMES  DAILY AS NEEDED FOR DIARRHEA OR  LOOSE STOOLS, Disp: 60 capsule, Rfl: 0   metFORMIN (GLUCOPHAGE-XR) 750 MG 24 hr tablet, TAKE 1 TABLET BY MOUTH DAILY  WITH BREAKFAST, Disp: 100 tablet, Rfl: 3   MULTIPLE VITAMIN PO, Take 1 tablet by mouth daily. Reported on 03/13/2016, Disp: , Rfl:    ondansetron (ZOFRAN-ODT) 4 MG disintegrating tablet, Take 1 tablet (4 mg total) by mouth every 8 (eight)  hours as needed for nausea or vomiting., Disp: 20 tablet, Rfl: 0   rosuvastatin (CRESTOR) 40 MG tablet, Take 1 tablet (40 mg total) by mouth daily., Disp: 90 tablet, Rfl: 3   TRELEGY ELLIPTA 100-62.5-25 MCG/ACT AEPB, USE 1 INHALATION BY MOUTH DAILY, Disp: 120 each, Rfl: 5   benzonatate (TESSALON) 100 MG capsule, Take 1 capsule (100 mg total) by mouth 3 (three) times daily as needed. (Patient not taking: Reported on 08/29/2022), Disp: 30 capsule, Rfl: 0   ibuprofen (ADVIL) 600 MG tablet, Take 1 tablet (600 mg total) by mouth every 8 (eight) hours as needed for moderate pain. (Patient not taking: Reported on 08/12/2022), Disp: 60 tablet, Rfl: 1   letrozole (FEMARA) 2.5 MG tablet, Take 1 tablet (2.5 mg total) by mouth daily. Start one week after completion of radiation, Disp: 30 tablet, Rfl: 2   oxyCODONE (OXY IR/ROXICODONE) 5 MG immediate release tablet, Take 1 tablet (5 mg total) by mouth every 4 (four) hours as needed for severe pain. (Patient not taking: Reported on 08/29/2022), Disp: 30 tablet, Rfl: 0   Spacer/Aero-Holding Chambers (AEROCHAMBER MV) inhaler, Use as instructed, Disp: 1 each, Rfl: 1  Cognitive Assessment Identity Confirmed: : Name; DOB Cognitive Status: Normal   Functional Assessment Hearing Difficulty or Deaf: no Wear Glasses or Blind: yes Vision Management: Reading glasses only Concentrating, Remembering or Making Decisions  Difficulty (CP): no Difficulty Communicating: no Difficulty Eating/Swallowing: no Walking or Climbing Stairs Difficulty: no Dressing/Bathing Difficulty: no Doing Errands Independently Difficulty (such as shopping) (CP): no Change in Functional Status Since Onset of Current Illness/Injury: no   Caregiver Assessment  No data recorded  Planned Interventions  Diabetes Reviewed provider's plan for Diabetes Management. Reviewed medications and discussed importance of medication adherence.  Reports currently taking medications as prescribed. Denies  current concerns related to medication management or prescription cost. Agreed to update the care management team with changes. Reports currently not being required to monitor fasting blood glucose. A1C is currently within goal.  Reviewed s/sx of hypoglycemia and hyperglycemia along with recommended interventions. Reports episodes of fatigue that she attributes to radiation treatment. Denies hypoglycemic or hyperglycemic episodes. Discussed nutritional intake and importance of complying with a diabetic diet. Reports doing well with diet adherence. Notes decreased appetite since starting radiation within the last week. Thorough discussion regarding meal and snack options to increase intake of protein and maintain optimal glycemic levels.  Discussed importance of completing recommended DM preventive care. Advised to complete foot care and annual eye exams as recommended. Discussed importance of completing ordered labs as prescrib  COPD Reviewed provider's plan for COPD management. Reviewed medications. Reports taking medications and using inhalers as prescribed. Reports receiving a notification from the insurance provider indicating change in coverage for albuterol. Agreed to provide a copy of the documentation to the office. PCP informed and will ensure a new order is submitted for the insurance providers preferred brand. Discussed symptoms. Denies exacerbations since hospital admission on 07/28/22. Currently experiencing a productive cough with clear sputum. Provided information regarding s/sx of respiratory infection and indication for notifying a provider. Reports symptoms have been controlled with prescribed inhalers. Advised to continue taking needed precautions to prevent exacerbation.  Discussed current tobacco use. Attempting to decrease use but reports currently smoking daily. Discussed various options and resources for smoking cessation. Declined offer for counseling. Agreed to update the care  management team if additional resources are needed. Reviewed current action plan. Discussed importance of daily self-assessment. Advised to make an appointment with provider if experiencing moderate symptoms for greater than 48 hours without improvement.  Provided information regarding infection prevention and increased risk r/t COPD. Advised to utilize prevention strategies to reduce risk of respiratory infection. Reviewed worsening symptoms that require immediate medical attention.    Breast Cancer Reviewed plan for treatment of malignant neoplasm of the left breast. Currently plan is to complete daily radiation therapy with last treatment scheduled for 09/25/21. Confirms starting radiation therapy on 08/22/22. Reports increased fatigue but overall reports tolerating treatment well.  Discussed ability to complete ADLs and care coordination needs. Reports currently completing all ADL and IADLs independently. Reports her daughter has been available to assist in the home since start of radiation therapy. Declined current need for in home or care coordination or in home resources. Agreed to keep the care management team updated if this changes or if her functional status declines. Reviewed pending appointments. Will continue daily radiation unless experiencing complications. Pending a mammogram on 09/16/21. Pending follow up with the Oncology team on 09/20/21. Declined current need for transportation assistance. Agreed to update the care management team if this changes. Discussed symptoms. Denies exacerbations since hospital admission on 07/28/22. Currently experiencing a productive cough with clear sputum. Provided information regarding s/sx of respiratory infection and indication for notifying a provider. Reports symptoms have been controlled with prescribed inhalers. Advised to continue taking needed precautions to  prevent exacerbation.  Reviewed worsening symptoms and indications for seeking medical  attention.   Interaction and coordination with outside resources, practitioners, and providers See CCM Referral  Care Plan: Available in MyChart

## 2022-08-29 NOTE — Patient Instructions (Addendum)
Thank you for allowing the Chronic Care Management team to participate in your care. It was great speaking with you!   Our next outreach is scheduled for 09/27/22 at 0900. Please do not hesitate to call if you require assistance prior to our next appointment. Please call the care guide team at [phone] if you need to cancel or reschedule your appointment.    Following is a copy of the CCM Program Consent:  CCM service includes personalized support from designated clinical staff supervised by the physician, including individualized plan of care and coordination with other care providers 24/7 contact phone numbers for assistance for urgent and routine care needs. Service will only be billed when office clinical staff spend 20 minutes or more in a month to coordinate care. Only one practitioner may furnish and bill the service in a calendar month. The patient may stop CCM services at amy time (effective at the end of the month) by phone call to the office staff. The patient will be responsible for cost sharing (co-pay) or up to 20% of the service fee (after annual deductible is met)   Following is a copy of your full provider care plan:   Goals Addressed             This Visit's Progress    CCM Expected Outcome:  Monitor, Self-Manage and Reduce Symptoms of:       Current Barriers:  Chronic Disease Management support and education needs related to Diabetes  Planned Interventions: Reviewed provider's plan for Diabetes Management. Reviewed medications and discussed importance of medication adherence.  Reports currently taking medications as prescribed. Denies current concerns related to medication management or prescription cost. Agreed to update the care management team with changes. Reports currently not being required to monitor fasting blood glucose. A1C is currently within goal.  Reviewed s/sx of hypoglycemia and hyperglycemia along with recommended interventions. Reports episodes  of fatigue that she attributes to radiation treatment. Denies hypoglycemic or hyperglycemic episodes. Discussed nutritional intake and importance of complying with a diabetic diet. Reports doing well with diet adherence. Notes decreased appetite since starting radiation within the last week. Thorough discussion regarding meal and snack options to increase intake of protein and maintain optimal glycemic levels.  Discussed importance of completing recommended DM preventive care. Advised to complete foot care and annual eye exams as recommended. Discussed importance of completing ordered labs as prescribed.  Assessed social determinant of health barriers.   Lab Results  Component Value Date   HGBA1C 6.1 (H) 04/11/2022    Symptom Management: Take medications as prescribed   Attend all scheduled provider appointments Call pharmacy for medication refills 3-7 days in advance of running out of medications Keep appointment with eye doctor Check feet daily for cuts, sores or redness Wash and dry feet carefully every day Wear comfortable, cotton socks Wear comfortable, well-fitting shoes Continue reading nutrition labels and adhering to recommended diabetic diet. Call provider office for new concerns or questions   Follow Up Plan:  Will follow up next month              Goal: CCM (Breast Cancer) Expected Outcome:  Monitor, Self-Manage And Reduce Symptoms of Breast Cancer Treatments       Current Barriers:  Chronic Disease Management support and education needs related to Breast Cancer  Planned Interventions: Reviewed plan for treatment of malignant neoplasm of the left breast. Currently plan is to complete daily radiation therapy with last treatment scheduled for 09/25/21. Confirms starting radiation therapy on  08/22/22. Reports increased fatigue but overall reports tolerating treatment well.  Discussed ability to complete ADLs and care coordination needs. Reports currently completing all  ADL and IADLs independently. Reports her daughter has been available to assist in the home since start of radiation therapy. Declined current need for in home or care coordination or in home resources. Agreed to keep the care management team updated if this changes or if her functional status declines. Reviewed pending appointments. Will continue daily radiation unless experiencing complications. Pending a mammogram on 09/16/21. Pending follow up with the Oncology team on 09/20/21. Declined current need for transportation assistance. Agreed to update the care management team if this changes. Discussed symptoms. Denies exacerbations since hospital admission on 07/28/22. Currently experiencing a productive cough with clear sputum. Provided information regarding s/sx of respiratory infection and indication for notifying a provider. Reports symptoms have been controlled with prescribed inhalers. Advised to continue taking needed precautions to prevent exacerbation.  Reviewed worsening symptoms and indications for seeking medical attention   Symptom Management: Attend all provider appointments Attend daily radiation treatments as scheduled Follow activity restriction as advised by the Oncology treatment team Update the care management team to address care coordination concerns as needed Call provider office for new concerns or questions   Follow Up Plan:  Will follow up next month       Goal: CCM (COPD) Expected Outcome:  Monitor, Self-Manage And Reduce Symptoms of Chronic Obstructive Pulmonary Disease       Current Barriers:  Chronic Disease Management support and education needs related to COPD  Planned Interventions: Reviewed provider's plan for COPD management. Reviewed medications. Reports taking medications and using inhalers as prescribed. Reports receiving a notification from the insurance provider indicating change in coverage for albuterol. Agreed to provide a copy of the documentation to  the office. PCP informed and will ensure a new order is submitted for the insurance providers preferred brand. Discussed symptoms. Denies exacerbations since hospital admission on 07/28/22. Currently experiencing a productive cough with clear sputum. Provided information regarding s/sx of respiratory infection and indication for notifying a provider. Reports symptoms have been controlled with prescribed inhalers. Advised to continue taking needed precautions to prevent exacerbation.  Discussed current tobacco use. Attempting to decrease use but reports currently smoking daily. Discussed various options and resources for smoking cessation. Declined offer for counseling. Agreed to update the care management team if additional resources are needed. Reviewed current action plan. Discussed importance of daily self-assessment. Advised to make an appointment with provider if experiencing moderate symptoms for greater than 48 hours without improvement.  Provided information regarding infection prevention and increased risk r/t COPD. Advised to utilize prevention strategies to reduce risk of respiratory infection. Reviewed worsening symptoms that require immediate medical attention.    Symptom Management: Call pharmacy for medication refills 3-7 days in advance of running out of medications Identify and remove indoor air pollutants Do breathing exercises every day Eliminate symptom triggers at home Practice relaxation or meditation daily Consider plan for smoking cessation Follow rescue plan if symptoms flare-up Call provider office for new concerns or questions   Follow Up Plan:   Will follow up next month           Alexandria [name] verbalized understanding of instructions and care plan provided today.Agreed to view in [place].   A member of the care management team will follow up next month     [place] Manager/Chronic Care Management [phone]

## 2022-08-30 ENCOUNTER — Other Ambulatory Visit: Payer: Self-pay

## 2022-08-30 ENCOUNTER — Ambulatory Visit
Admission: RE | Admit: 2022-08-30 | Discharge: 2022-08-30 | Disposition: A | Discharge status: Home / Self Care | Payer: Medicare Other | Source: Ambulatory Visit | Attending: Radiation Oncology | Admitting: Radiation Oncology

## 2022-08-30 DIAGNOSIS — C50412 Malignant neoplasm of upper-outer quadrant of left female breast: Secondary | ICD-10-CM | POA: Diagnosis not present

## 2022-08-30 DIAGNOSIS — Z170 Estrogen receptor positive status [ER+]: Secondary | ICD-10-CM | POA: Diagnosis not present

## 2022-08-30 DIAGNOSIS — Z510 Encounter for antineoplastic radiation therapy: Secondary | ICD-10-CM | POA: Diagnosis not present

## 2022-08-30 LAB — RAD ONC ARIA SESSION SUMMARY
Course Elapsed Days: 3
Plan Fractions Treated to Date: 4
Plan Prescribed Dose Per Fraction: 2.66 Gy
Plan Total Fractions Prescribed: 16
Plan Total Prescribed Dose: 42.56 Gy
Reference Point Dosage Given to Date: 10.64 Gy
Reference Point Session Dosage Given: 2.66 Gy
Session Number: 4

## 2022-08-30 NOTE — Telephone Encounter (Signed)
   CCM RN Visit Note   08/21/22 Name: PAYETON GERMANI MRN: [id number]      DOB: 11/28/1955  Subjective: Quenten Raven Summa is a 66 y.o. year old female who is a primary care patient of Gwyneth Sprout, FNP. The patient was referred to the Chronic Care Management team for assistance with care management needs subsequent to provider initiation of CCM services and plan of care.      Mrs. Gade was contacted to discuss plan for CCM outreach. Reports pending start of radiation therapy tomorrow. Anticipates being available for telephonic CCM outreach on 08/29/22.   PLAN: Appointment scheduled for 08/29/22.   Horris Latino RN Care Manager/Chronic Care Management [phone]

## 2022-08-30 NOTE — Telephone Encounter (Signed)
   CCM RN Visit Note   08/27/22 Name: Alexandria [name] MRN: [id number]      DOB: Apr 12, 1956  Subjective: Alexandria [name] is a 66 y.o. year old female who is a primary care patient of Gwyneth Sprout, FNP.   Call received from Alexandria [name] regarding pending CCM appointment. Requested to reschedule d/t conflict with radiation appointment.  PLAN: Appointment rescheduled to 08/29/22 at 0900 as requested.    Horris Latino RN Care Manager/Chronic Care Management [phone]

## 2022-09-01 DIAGNOSIS — E1159 Type 2 diabetes mellitus with other circulatory complications: Secondary | ICD-10-CM

## 2022-09-01 DIAGNOSIS — J449 Chronic obstructive pulmonary disease, unspecified: Secondary | ICD-10-CM

## 2022-09-01 DIAGNOSIS — Z170 Estrogen receptor positive status [ER+]: Secondary | ICD-10-CM

## 2022-09-01 DIAGNOSIS — C50912 Malignant neoplasm of unspecified site of left female breast: Secondary | ICD-10-CM

## 2022-09-01 DIAGNOSIS — F17210 Nicotine dependence, cigarettes, uncomplicated: Secondary | ICD-10-CM

## 2022-09-03 ENCOUNTER — Ambulatory Visit
Admission: RE | Admit: 2022-09-03 | Discharge: 2022-09-03 | Disposition: A | Discharge status: Home / Self Care | Payer: 59 | Source: Ambulatory Visit | Attending: Radiation Oncology | Admitting: Radiation Oncology

## 2022-09-03 ENCOUNTER — Other Ambulatory Visit: Payer: Self-pay

## 2022-09-03 ENCOUNTER — Encounter: Payer: Self-pay | Admitting: *Deleted

## 2022-09-03 DIAGNOSIS — Z510 Encounter for antineoplastic radiation therapy: Secondary | ICD-10-CM | POA: Insufficient documentation

## 2022-09-03 DIAGNOSIS — C50412 Malignant neoplasm of upper-outer quadrant of left female breast: Secondary | ICD-10-CM | POA: Diagnosis not present

## 2022-09-03 DIAGNOSIS — Z170 Estrogen receptor positive status [ER+]: Secondary | ICD-10-CM | POA: Diagnosis not present

## 2022-09-03 LAB — RAD ONC ARIA SESSION SUMMARY
Course Elapsed Days: 7
Plan Fractions Treated to Date: 5
Plan Prescribed Dose Per Fraction: 2.66 Gy
Plan Total Fractions Prescribed: 16
Plan Total Prescribed Dose: 42.56 Gy
Reference Point Dosage Given to Date: 13.3 Gy
Reference Point Session Dosage Given: 2.66 Gy
Session Number: 5

## 2022-09-04 ENCOUNTER — Ambulatory Visit
Admission: RE | Admit: 2022-09-04 | Discharge: 2022-09-04 | Disposition: A | Discharge status: Home / Self Care | Payer: 59 | Source: Ambulatory Visit | Attending: Radiation Oncology | Admitting: Radiation Oncology

## 2022-09-04 ENCOUNTER — Other Ambulatory Visit: Payer: Self-pay

## 2022-09-04 DIAGNOSIS — Z510 Encounter for antineoplastic radiation therapy: Secondary | ICD-10-CM | POA: Diagnosis not present

## 2022-09-04 DIAGNOSIS — C50412 Malignant neoplasm of upper-outer quadrant of left female breast: Secondary | ICD-10-CM | POA: Diagnosis not present

## 2022-09-04 DIAGNOSIS — Z170 Estrogen receptor positive status [ER+]: Secondary | ICD-10-CM | POA: Diagnosis not present

## 2022-09-04 LAB — RAD ONC ARIA SESSION SUMMARY
Course Elapsed Days: 8
Plan Fractions Treated to Date: 6
Plan Prescribed Dose Per Fraction: 2.66 Gy
Plan Total Fractions Prescribed: 16
Plan Total Prescribed Dose: 42.56 Gy
Reference Point Dosage Given to Date: 15.96 Gy
Reference Point Session Dosage Given: 2.66 Gy
Session Number: 6

## 2022-09-05 ENCOUNTER — Ambulatory Visit
Admission: RE | Admit: 2022-09-05 | Discharge: 2022-09-05 | Disposition: A | Discharge status: Home / Self Care | Payer: 59 | Source: Ambulatory Visit | Attending: Radiation Oncology | Admitting: Radiation Oncology

## 2022-09-05 ENCOUNTER — Other Ambulatory Visit: Payer: Self-pay

## 2022-09-05 DIAGNOSIS — C50412 Malignant neoplasm of upper-outer quadrant of left female breast: Secondary | ICD-10-CM | POA: Diagnosis not present

## 2022-09-05 DIAGNOSIS — Z510 Encounter for antineoplastic radiation therapy: Secondary | ICD-10-CM | POA: Diagnosis not present

## 2022-09-05 DIAGNOSIS — Z170 Estrogen receptor positive status [ER+]: Secondary | ICD-10-CM | POA: Diagnosis not present

## 2022-09-05 LAB — RAD ONC ARIA SESSION SUMMARY
Course Elapsed Days: 9
Plan Fractions Treated to Date: 7
Plan Prescribed Dose Per Fraction: 2.66 Gy
Plan Total Fractions Prescribed: 16
Plan Total Prescribed Dose: 42.56 Gy
Reference Point Dosage Given to Date: 18.62 Gy
Reference Point Session Dosage Given: 2.66 Gy
Session Number: 7

## 2022-09-06 ENCOUNTER — Telehealth: Payer: Self-pay | Admitting: Family Medicine

## 2022-09-06 ENCOUNTER — Other Ambulatory Visit: Payer: Self-pay

## 2022-09-06 ENCOUNTER — Inpatient Hospital Stay: Payer: 59

## 2022-09-06 ENCOUNTER — Ambulatory Visit
Admission: RE | Admit: 2022-09-06 | Discharge: 2022-09-06 | Disposition: A | Discharge status: Home / Self Care | Payer: 59 | Source: Ambulatory Visit | Attending: Radiation Oncology | Admitting: Radiation Oncology

## 2022-09-06 DIAGNOSIS — Z170 Estrogen receptor positive status [ER+]: Secondary | ICD-10-CM | POA: Insufficient documentation

## 2022-09-06 DIAGNOSIS — F17210 Nicotine dependence, cigarettes, uncomplicated: Secondary | ICD-10-CM | POA: Insufficient documentation

## 2022-09-06 DIAGNOSIS — Z510 Encounter for antineoplastic radiation therapy: Secondary | ICD-10-CM | POA: Diagnosis not present

## 2022-09-06 DIAGNOSIS — C50412 Malignant neoplasm of upper-outer quadrant of left female breast: Secondary | ICD-10-CM

## 2022-09-06 LAB — RAD ONC ARIA SESSION SUMMARY
Course Elapsed Days: 10
Plan Fractions Treated to Date: 8
Plan Prescribed Dose Per Fraction: 2.66 Gy
Plan Total Fractions Prescribed: 16
Plan Total Prescribed Dose: 42.56 Gy
Reference Point Dosage Given to Date: 21.28 Gy
Reference Point Session Dosage Given: 2.66 Gy
Session Number: 8

## 2022-09-06 LAB — CBC
HCT: 42.9 % (ref 36.0–46.0)
Hemoglobin: 14.3 g/dL (ref 12.0–15.0)
MCH: 28.4 pg (ref 26.0–34.0)
MCHC: 33.3 g/dL (ref 30.0–36.0)
MCV: 85.3 fL (ref 80.0–100.0)
Platelets: 351 10*3/uL (ref 150–400)
RBC: 5.03 MIL/uL (ref 3.87–5.11)
RDW: 13.3 % (ref 11.5–15.5)
WBC: 6.1 10*3/uL (ref 4.0–10.5)
nRBC: 0 % (ref 0.0–0.2)

## 2022-09-06 MED ORDER — ALBUTEROL SULFATE HFA 108 (90 BASE) MCG/ACT IN AERS: 1.0000 | INHALATION_SPRAY | RESPIRATORY_TRACT | 0 refills | Status: DC | PRN

## 2022-09-06 NOTE — Telephone Encounter (Signed)
Requested medication (s) are due for refill today: routing for review  Requested medication (s) are on the active medication list: yes  Last refill:  07/24/22  Future visit scheduled: yes  Notes to clinic:  Unable to refill per protocol, last refill by another provider.      Requested Prescriptions  Pending Prescriptions Disp Refills   albuterol (VENTOLIN HFA) 108 (90 Base) MCG/ACT inhaler 1 each 0    Sig: Inhale 1-2 puffs into the lungs every 4 (four) hours as needed for wheezing or shortness of breath.     Pulmonology:  Beta Agonists 2 Passed - 09/06/2022 12:55 PM      Passed - Last BP in normal range    BP Readings from Last 1 Encounters:  08/12/22 125/66         Passed - Last Heart Rate in normal range    Pulse Readings from Last 1 Encounters:  08/12/22 81         Passed - Valid encounter within last 12 months    Recent Outpatient Visits           3 weeks ago COPD with hypoxia [facility])   [facility] Gwyneth Sprout, FNP   4 months ago Annual physical exam   [facility] Gwyneth Sprout, FNP   1 year ago Change in stool   [facility] Gwyneth Sprout, FNP   1 year ago [place] Gwyneth Sprout, FNP   1 year ago [facility] Gwyneth Sprout, [place]

## 2022-09-06 NOTE — Telephone Encounter (Signed)
Medication Refill - Medication: albuterol (VENTOLIN HFA) 108 (90 Base) MCG/ACT inhaler   Has the patient contacted their pharmacy? Yes.   (Agent: If no, request that the patient contact the pharmacy for the refill. If patient does not wish to contact the pharmacy document the reason why and proceed with request.) (Agent: If yes, when and what did the pharmacy advise?)  Preferred Pharmacy (with phone number or street name):  [place], [place] Phone: [phone]  Fax: [phone]     Has the patient been seen for an appointment in the last year OR does the patient have an upcoming appointment? Yes.    Agent: Please be advised that RX refills may take up to 3 business days. We ask that you follow-up with your pharmacy.

## 2022-09-09 ENCOUNTER — Ambulatory Visit
Admission: RE | Admit: 2022-09-09 | Discharge: 2022-09-09 | Disposition: A | Discharge status: Home / Self Care | Payer: 59 | Source: Ambulatory Visit | Attending: Radiation Oncology | Admitting: Radiation Oncology

## 2022-09-09 ENCOUNTER — Other Ambulatory Visit: Payer: Self-pay | Admitting: Family Medicine

## 2022-09-09 ENCOUNTER — Other Ambulatory Visit: Payer: Self-pay

## 2022-09-09 DIAGNOSIS — Z170 Estrogen receptor positive status [ER+]: Secondary | ICD-10-CM | POA: Diagnosis not present

## 2022-09-09 DIAGNOSIS — C50412 Malignant neoplasm of upper-outer quadrant of left female breast: Secondary | ICD-10-CM | POA: Diagnosis not present

## 2022-09-09 DIAGNOSIS — Z510 Encounter for antineoplastic radiation therapy: Secondary | ICD-10-CM | POA: Diagnosis not present

## 2022-09-09 LAB — RAD ONC ARIA SESSION SUMMARY
Course Elapsed Days: 13
Plan Fractions Treated to Date: 9
Plan Prescribed Dose Per Fraction: 2.66 Gy
Plan Total Fractions Prescribed: 16
Plan Total Prescribed Dose: 42.56 Gy
Reference Point Dosage Given to Date: 23.94 Gy
Reference Point Session Dosage Given: 2.66 Gy
Session Number: 9

## 2022-09-09 NOTE — Telephone Encounter (Signed)
Patients [name] states that the pharmacy called stating that they need clarification for the formulation for the medication. Please advise.

## 2022-09-10 ENCOUNTER — Other Ambulatory Visit: Payer: Self-pay

## 2022-09-10 ENCOUNTER — Other Ambulatory Visit: Payer: Self-pay | Admitting: Family Medicine

## 2022-09-10 ENCOUNTER — Ambulatory Visit
Admission: RE | Admit: 2022-09-10 | Discharge: 2022-09-10 | Disposition: A | Discharge status: Home / Self Care | Payer: 59 | Source: Ambulatory Visit | Attending: Radiation Oncology | Admitting: Radiation Oncology

## 2022-09-10 DIAGNOSIS — C50412 Malignant neoplasm of upper-outer quadrant of left female breast: Secondary | ICD-10-CM | POA: Diagnosis not present

## 2022-09-10 DIAGNOSIS — Z510 Encounter for antineoplastic radiation therapy: Secondary | ICD-10-CM | POA: Diagnosis not present

## 2022-09-10 DIAGNOSIS — Z170 Estrogen receptor positive status [ER+]: Secondary | ICD-10-CM | POA: Diagnosis not present

## 2022-09-10 LAB — RAD ONC ARIA SESSION SUMMARY
Course Elapsed Days: 14
Plan Fractions Treated to Date: 10
Plan Prescribed Dose Per Fraction: 2.66 Gy
Plan Total Fractions Prescribed: 16
Plan Total Prescribed Dose: 42.56 Gy
Reference Point Dosage Given to Date: 26.6 Gy
Reference Point Session Dosage Given: 2.66 Gy
Session Number: 10

## 2022-09-10 MED ORDER — ALBUTEROL SULFATE HFA 108 (90 BASE) MCG/ACT IN AERS: 1.0000 | INHALATION_SPRAY | RESPIRATORY_TRACT | 11 refills | Status: DC | PRN

## 2022-09-11 ENCOUNTER — Other Ambulatory Visit: Payer: Self-pay | Admitting: Family Medicine

## 2022-09-11 ENCOUNTER — Ambulatory Visit
Admission: RE | Admit: 2022-09-11 | Discharge: 2022-09-11 | Disposition: A | Discharge status: Home / Self Care | Payer: 59 | Source: Ambulatory Visit | Attending: Radiation Oncology | Admitting: Radiation Oncology

## 2022-09-11 ENCOUNTER — Other Ambulatory Visit: Payer: Self-pay

## 2022-09-11 DIAGNOSIS — C50412 Malignant neoplasm of upper-outer quadrant of left female breast: Secondary | ICD-10-CM | POA: Diagnosis not present

## 2022-09-11 DIAGNOSIS — Z510 Encounter for antineoplastic radiation therapy: Secondary | ICD-10-CM | POA: Diagnosis not present

## 2022-09-11 DIAGNOSIS — Z170 Estrogen receptor positive status [ER+]: Secondary | ICD-10-CM | POA: Diagnosis not present

## 2022-09-11 LAB — RAD ONC ARIA SESSION SUMMARY
Course Elapsed Days: 15
Plan Fractions Treated to Date: 11
Plan Prescribed Dose Per Fraction: 2.66 Gy
Plan Total Fractions Prescribed: 16
Plan Total Prescribed Dose: 42.56 Gy
Reference Point Dosage Given to Date: 29.26 Gy
Reference Point Session Dosage Given: 2.66 Gy
Session Number: 11

## 2022-09-11 MED ORDER — ALBUTEROL SULFATE HFA 108 (90 BASE) MCG/ACT IN AERS: 2.0000 | INHALATION_SPRAY | RESPIRATORY_TRACT | 11 refills | Status: DC | PRN

## 2022-09-12 ENCOUNTER — Ambulatory Visit
Admission: RE | Admit: 2022-09-12 | Discharge: 2022-09-12 | Disposition: A | Discharge status: Home / Self Care | Payer: 59 | Source: Ambulatory Visit | Attending: Radiation Oncology | Admitting: Radiation Oncology

## 2022-09-12 ENCOUNTER — Other Ambulatory Visit: Payer: Self-pay

## 2022-09-12 DIAGNOSIS — Z510 Encounter for antineoplastic radiation therapy: Secondary | ICD-10-CM | POA: Diagnosis not present

## 2022-09-12 DIAGNOSIS — C50412 Malignant neoplasm of upper-outer quadrant of left female breast: Secondary | ICD-10-CM | POA: Diagnosis not present

## 2022-09-12 DIAGNOSIS — Z170 Estrogen receptor positive status [ER+]: Secondary | ICD-10-CM | POA: Diagnosis not present

## 2022-09-12 LAB — RAD ONC ARIA SESSION SUMMARY
Course Elapsed Days: 16
Plan Fractions Treated to Date: 12
Plan Prescribed Dose Per Fraction: 2.66 Gy
Plan Total Fractions Prescribed: 16
Plan Total Prescribed Dose: 42.56 Gy
Reference Point Dosage Given to Date: 31.92 Gy
Reference Point Session Dosage Given: 2.66 Gy
Session Number: 12

## 2022-09-13 ENCOUNTER — Ambulatory Visit: Payer: 59

## 2022-09-16 ENCOUNTER — Other Ambulatory Visit: Payer: Self-pay

## 2022-09-16 ENCOUNTER — Ambulatory Visit
Admission: RE | Admit: 2022-09-16 | Discharge: 2022-09-16 | Disposition: A | Discharge status: Home / Self Care | Payer: 59 | Source: Ambulatory Visit | Attending: Internal Medicine | Admitting: Internal Medicine

## 2022-09-16 ENCOUNTER — Ambulatory Visit
Admission: RE | Admit: 2022-09-16 | Discharge: 2022-09-16 | Disposition: A | Discharge status: Home / Self Care | Payer: 59 | Source: Ambulatory Visit | Attending: Radiation Oncology | Admitting: Radiation Oncology

## 2022-09-16 DIAGNOSIS — Z510 Encounter for antineoplastic radiation therapy: Secondary | ICD-10-CM | POA: Diagnosis not present

## 2022-09-16 DIAGNOSIS — Z780 Asymptomatic menopausal state: Secondary | ICD-10-CM | POA: Diagnosis not present

## 2022-09-16 DIAGNOSIS — Z79811 Long term (current) use of aromatase inhibitors: Secondary | ICD-10-CM | POA: Insufficient documentation

## 2022-09-16 DIAGNOSIS — Z5181 Encounter for therapeutic drug level monitoring: Secondary | ICD-10-CM | POA: Diagnosis not present

## 2022-09-16 DIAGNOSIS — C50412 Malignant neoplasm of upper-outer quadrant of left female breast: Secondary | ICD-10-CM | POA: Diagnosis not present

## 2022-09-16 DIAGNOSIS — Z170 Estrogen receptor positive status [ER+]: Secondary | ICD-10-CM | POA: Diagnosis not present

## 2022-09-16 LAB — RAD ONC ARIA SESSION SUMMARY
Course Elapsed Days: 20
Plan Fractions Treated to Date: 13
Plan Prescribed Dose Per Fraction: 2.66 Gy
Plan Total Fractions Prescribed: 16
Plan Total Prescribed Dose: 42.56 Gy
Reference Point Dosage Given to Date: 34.58 Gy
Reference Point Session Dosage Given: 2.66 Gy
Session Number: 13

## 2022-09-17 ENCOUNTER — Ambulatory Visit
Admission: RE | Admit: 2022-09-17 | Discharge: 2022-09-17 | Disposition: A | Discharge status: Home / Self Care | Payer: 59 | Source: Ambulatory Visit | Attending: Radiation Oncology | Admitting: Radiation Oncology

## 2022-09-17 ENCOUNTER — Other Ambulatory Visit: Payer: Self-pay

## 2022-09-17 DIAGNOSIS — Z510 Encounter for antineoplastic radiation therapy: Secondary | ICD-10-CM | POA: Diagnosis not present

## 2022-09-17 DIAGNOSIS — Z170 Estrogen receptor positive status [ER+]: Secondary | ICD-10-CM | POA: Diagnosis not present

## 2022-09-17 DIAGNOSIS — C50412 Malignant neoplasm of upper-outer quadrant of left female breast: Secondary | ICD-10-CM | POA: Diagnosis not present

## 2022-09-17 LAB — RAD ONC ARIA SESSION SUMMARY
Course Elapsed Days: 21
Plan Fractions Treated to Date: 14
Plan Prescribed Dose Per Fraction: 2.66 Gy
Plan Total Fractions Prescribed: 16
Plan Total Prescribed Dose: 42.56 Gy
Reference Point Dosage Given to Date: 37.24 Gy
Reference Point Session Dosage Given: 2.66 Gy
Session Number: 14

## 2022-09-17 MED ORDER — ALBUTEROL SULFATE HFA 108 (90 BASE) MCG/ACT IN AERS: 2.0000 | INHALATION_SPRAY | RESPIRATORY_TRACT | 11 refills | Status: DC | PRN

## 2022-09-18 ENCOUNTER — Ambulatory Visit
Admission: RE | Admit: 2022-09-18 | Discharge: 2022-09-18 | Disposition: A | Discharge status: Home / Self Care | Payer: 59 | Source: Ambulatory Visit | Attending: Radiation Oncology | Admitting: Radiation Oncology

## 2022-09-18 ENCOUNTER — Ambulatory Visit: Payer: 59

## 2022-09-18 ENCOUNTER — Other Ambulatory Visit: Payer: Self-pay

## 2022-09-18 DIAGNOSIS — Z170 Estrogen receptor positive status [ER+]: Secondary | ICD-10-CM | POA: Diagnosis not present

## 2022-09-18 DIAGNOSIS — C50412 Malignant neoplasm of upper-outer quadrant of left female breast: Secondary | ICD-10-CM | POA: Diagnosis not present

## 2022-09-18 DIAGNOSIS — Z510 Encounter for antineoplastic radiation therapy: Secondary | ICD-10-CM | POA: Diagnosis not present

## 2022-09-18 LAB — RAD ONC ARIA SESSION SUMMARY
Course Elapsed Days: 22
Plan Fractions Treated to Date: 15
Plan Prescribed Dose Per Fraction: 2.66 Gy
Plan Total Fractions Prescribed: 16
Plan Total Prescribed Dose: 42.56 Gy
Reference Point Dosage Given to Date: 39.9 Gy
Reference Point Session Dosage Given: 2.66 Gy
Session Number: 15

## 2022-09-19 ENCOUNTER — Ambulatory Visit: Admission: RE | Admit: 2022-09-19 | Payer: 59 | Source: Ambulatory Visit

## 2022-09-19 ENCOUNTER — Ambulatory Visit: Payer: 59

## 2022-09-19 ENCOUNTER — Other Ambulatory Visit: Payer: Self-pay

## 2022-09-19 ENCOUNTER — Ambulatory Visit
Admission: RE | Admit: 2022-09-19 | Discharge: 2022-09-19 | Disposition: A | Discharge status: Home / Self Care | Payer: 59 | Source: Ambulatory Visit | Attending: Radiation Oncology | Admitting: Radiation Oncology

## 2022-09-19 DIAGNOSIS — Z510 Encounter for antineoplastic radiation therapy: Secondary | ICD-10-CM | POA: Diagnosis not present

## 2022-09-19 DIAGNOSIS — C50412 Malignant neoplasm of upper-outer quadrant of left female breast: Secondary | ICD-10-CM | POA: Diagnosis not present

## 2022-09-19 DIAGNOSIS — Z170 Estrogen receptor positive status [ER+]: Secondary | ICD-10-CM | POA: Diagnosis not present

## 2022-09-19 LAB — RAD ONC ARIA SESSION SUMMARY
Course Elapsed Days: 23
Plan Fractions Treated to Date: 16
Plan Prescribed Dose Per Fraction: 2.66 Gy
Plan Total Fractions Prescribed: 16
Plan Total Prescribed Dose: 42.56 Gy
Reference Point Dosage Given to Date: 42.56 Gy
Reference Point Session Dosage Given: 2.66 Gy
Session Number: 16

## 2022-09-20 ENCOUNTER — Ambulatory Visit
Admission: RE | Admit: 2022-09-20 | Discharge: 2022-09-20 | Disposition: A | Discharge status: Home / Self Care | Payer: 59 | Source: Ambulatory Visit | Attending: Radiation Oncology | Admitting: Radiation Oncology

## 2022-09-20 ENCOUNTER — Ambulatory Visit: Payer: 59

## 2022-09-20 ENCOUNTER — Other Ambulatory Visit: Payer: Self-pay

## 2022-09-20 ENCOUNTER — Inpatient Hospital Stay: Payer: 59

## 2022-09-20 DIAGNOSIS — C50412 Malignant neoplasm of upper-outer quadrant of left female breast: Secondary | ICD-10-CM

## 2022-09-20 DIAGNOSIS — Z510 Encounter for antineoplastic radiation therapy: Secondary | ICD-10-CM | POA: Diagnosis not present

## 2022-09-20 DIAGNOSIS — Z170 Estrogen receptor positive status [ER+]: Secondary | ICD-10-CM | POA: Diagnosis not present

## 2022-09-20 LAB — RAD ONC ARIA SESSION SUMMARY
Course Elapsed Days: 24
Plan Fractions Treated to Date: 1
Plan Prescribed Dose Per Fraction: 2 Gy
Plan Total Fractions Prescribed: 5
Plan Total Prescribed Dose: 10 Gy
Reference Point Dosage Given to Date: 2 Gy
Reference Point Session Dosage Given: 2 Gy
Session Number: 17

## 2022-09-20 LAB — CBC
HCT: 44.1 % (ref 36.0–46.0)
Hemoglobin: 14.8 g/dL (ref 12.0–15.0)
MCH: 29.2 pg (ref 26.0–34.0)
MCHC: 33.6 g/dL (ref 30.0–36.0)
MCV: 87.2 fL (ref 80.0–100.0)
Platelets: 255 10*3/uL (ref 150–400)
RBC: 5.06 MIL/uL (ref 3.87–5.11)
RDW: 13.8 % (ref 11.5–15.5)
WBC: 6.2 10*3/uL (ref 4.0–10.5)
nRBC: 0 % (ref 0.0–0.2)

## 2022-09-23 ENCOUNTER — Other Ambulatory Visit: Payer: Self-pay

## 2022-09-23 ENCOUNTER — Ambulatory Visit
Admission: RE | Admit: 2022-09-23 | Discharge: 2022-09-23 | Disposition: A | Discharge status: Home / Self Care | Payer: 59 | Source: Ambulatory Visit | Attending: Radiation Oncology | Admitting: Radiation Oncology

## 2022-09-23 DIAGNOSIS — C50412 Malignant neoplasm of upper-outer quadrant of left female breast: Secondary | ICD-10-CM | POA: Diagnosis not present

## 2022-09-23 DIAGNOSIS — Z510 Encounter for antineoplastic radiation therapy: Secondary | ICD-10-CM | POA: Diagnosis not present

## 2022-09-23 DIAGNOSIS — Z170 Estrogen receptor positive status [ER+]: Secondary | ICD-10-CM | POA: Diagnosis not present

## 2022-09-23 LAB — RAD ONC ARIA SESSION SUMMARY
Course Elapsed Days: 27
Plan Fractions Treated to Date: 2
Plan Prescribed Dose Per Fraction: 2 Gy
Plan Total Fractions Prescribed: 5
Plan Total Prescribed Dose: 10 Gy
Reference Point Dosage Given to Date: 4 Gy
Reference Point Session Dosage Given: 2 Gy
Session Number: 18

## 2022-09-24 ENCOUNTER — Other Ambulatory Visit: Payer: Self-pay

## 2022-09-24 ENCOUNTER — Ambulatory Visit
Admission: RE | Admit: 2022-09-24 | Discharge: 2022-09-24 | Disposition: A | Discharge status: Home / Self Care | Payer: 59 | Source: Ambulatory Visit | Attending: Radiation Oncology | Admitting: Radiation Oncology

## 2022-09-24 DIAGNOSIS — Z510 Encounter for antineoplastic radiation therapy: Secondary | ICD-10-CM | POA: Diagnosis not present

## 2022-09-24 DIAGNOSIS — C50412 Malignant neoplasm of upper-outer quadrant of left female breast: Secondary | ICD-10-CM | POA: Diagnosis not present

## 2022-09-24 DIAGNOSIS — Z170 Estrogen receptor positive status [ER+]: Secondary | ICD-10-CM | POA: Diagnosis not present

## 2022-09-24 LAB — RAD ONC ARIA SESSION SUMMARY
Course Elapsed Days: 28
Plan Fractions Treated to Date: 3
Plan Prescribed Dose Per Fraction: 2 Gy
Plan Total Fractions Prescribed: 5
Plan Total Prescribed Dose: 10 Gy
Reference Point Dosage Given to Date: 6 Gy
Reference Point Session Dosage Given: 2 Gy
Session Number: 19

## 2022-09-25 ENCOUNTER — Ambulatory Visit
Admission: RE | Admit: 2022-09-25 | Discharge: 2022-09-25 | Disposition: A | Discharge status: Home / Self Care | Payer: 59 | Source: Ambulatory Visit | Attending: Radiation Oncology | Admitting: Radiation Oncology

## 2022-09-25 ENCOUNTER — Ambulatory Visit: Payer: 59

## 2022-09-25 ENCOUNTER — Encounter: Payer: Self-pay | Admitting: *Deleted

## 2022-09-25 ENCOUNTER — Other Ambulatory Visit: Payer: Self-pay

## 2022-09-25 DIAGNOSIS — Z170 Estrogen receptor positive status [ER+]: Secondary | ICD-10-CM | POA: Diagnosis not present

## 2022-09-25 DIAGNOSIS — Z510 Encounter for antineoplastic radiation therapy: Secondary | ICD-10-CM | POA: Diagnosis not present

## 2022-09-25 DIAGNOSIS — C50412 Malignant neoplasm of upper-outer quadrant of left female breast: Secondary | ICD-10-CM | POA: Diagnosis not present

## 2022-09-25 LAB — RAD ONC ARIA SESSION SUMMARY
Course Elapsed Days: 29
Plan Fractions Treated to Date: 4
Plan Prescribed Dose Per Fraction: 2 Gy
Plan Total Fractions Prescribed: 5
Plan Total Prescribed Dose: 10 Gy
Reference Point Dosage Given to Date: 8 Gy
Reference Point Session Dosage Given: 2 Gy
Session Number: 20

## 2022-09-25 NOTE — Progress Notes (Signed)
Met with patient in radiation, she is doing well, last tx tomorrow.

## 2022-09-26 ENCOUNTER — Encounter: Payer: Self-pay | Admitting: *Deleted

## 2022-09-26 ENCOUNTER — Ambulatory Visit
Admission: RE | Admit: 2022-09-26 | Discharge: 2022-09-26 | Disposition: A | Discharge status: Home / Self Care | Payer: 59 | Source: Ambulatory Visit | Attending: Radiation Oncology | Admitting: Radiation Oncology

## 2022-09-26 ENCOUNTER — Other Ambulatory Visit: Payer: Self-pay

## 2022-09-26 DIAGNOSIS — Z510 Encounter for antineoplastic radiation therapy: Secondary | ICD-10-CM | POA: Diagnosis not present

## 2022-09-26 DIAGNOSIS — C50412 Malignant neoplasm of upper-outer quadrant of left female breast: Secondary | ICD-10-CM | POA: Diagnosis not present

## 2022-09-26 DIAGNOSIS — Z170 Estrogen receptor positive status [ER+]: Secondary | ICD-10-CM | POA: Diagnosis not present

## 2022-09-26 LAB — RAD ONC ARIA SESSION SUMMARY
Course Elapsed Days: 30
Plan Fractions Treated to Date: 5
Plan Prescribed Dose Per Fraction: 2 Gy
Plan Total Fractions Prescribed: 5
Plan Total Prescribed Dose: 10 Gy
Reference Point Dosage Given to Date: 10 Gy
Reference Point Session Dosage Given: 2 Gy
Session Number: 21

## 2022-09-27 ENCOUNTER — Ambulatory Visit (INDEPENDENT_AMBULATORY_CARE_PROVIDER_SITE_OTHER): Payer: 59

## 2022-09-27 DIAGNOSIS — C50912 Malignant neoplasm of unspecified site of left female breast: Secondary | ICD-10-CM

## 2022-09-27 DIAGNOSIS — J449 Chronic obstructive pulmonary disease, unspecified: Secondary | ICD-10-CM

## 2022-09-30 ENCOUNTER — Other Ambulatory Visit: Payer: Self-pay | Admitting: Family Medicine

## 2022-09-30 MED ORDER — ALBUTEROL SULFATE HFA 108 (90 BASE) MCG/ACT IN AERS: 1.0000 | INHALATION_SPRAY | Freq: Four times a day (QID) | RESPIRATORY_TRACT | 11 refills | Status: DC | PRN

## 2022-10-01 NOTE — Chronic Care Management (AMB) (Unsigned)
Chronic Care Management   CCM RN Visit Note   Name: Alexandria [name] MRN: [id number] DOB: [birth year]  Subjective: Alexandria [name] is a 67 y.o. year old female who is a primary care patient of Gwyneth Sprout, FNP. The patient was referred to the Chronic Care Management team for assistance with care management needs subsequent to provider initiation of CCM services and plan of care.    Today's Visit:  Engaged with patient by telephone for follow up visit.    Goals Addressed             This Visit's Progress    Goal: CCM (Breast Cancer) Expected Outcome:  Monitor, Self-Manage And Reduce Symptoms of Breast Cancer Treatments       Current Barriers:  Chronic Disease Management support and education needs related to Breast Cancer  Planned Interventions: Reviewed plan for treatment of malignant neoplasm of the left breast. Reports completing radiation therapy as planned. Reports tolerating well. Completed last treatment on 09/27/22. Will start Femara on 10/03/22. Discussed ability to complete ADLs and care coordination needs. Remains independent with ADLs and IADLs. Denies changes or decline in functional status. Discussed symptoms since treatment. Reports some fatigue but overall doing well. Notes radiation rash to left breast and armpit. Reports using Aquaphor as advised by the Radiation Oncology team. Wearing loose shirts to prevent further irritation. Aware of need to follow up if rash worsens or drainage is noted. Reviewed pending appointments. Will follow up with the Radiation Oncology team on 10/24/22. Will follow up with Oncologist on 10/29/22.   Symptom Management: Attend all provider appointments Take medications as prescribed Update the care management team to address care coordination concerns as needed Call provider office for new concerns or questions   Follow Up Plan:  Will follow up next month       Goal: CCM (COPD) Expected Outcome:  Monitor, Self-Manage And Reduce Symptoms of Chronic  Obstructive Pulmonary Disease       Current Barriers:  Chronic Disease Management support and education needs related to COPD  Planned Interventions: Reviewed provider's plan for COPD management. Reports taking medications as prescribed. Confirmed order for new albuterol inhalers. Reviewed symptoms. Reports symptoms have been well controlled. Experiencing fatigue but otherwise doing well.  Attempting to decrease use but reports currently smoking daily. Discussed various options and resources for smoking cessation. Declined offer for counseling. Agreed to update the care management team if additional resources are needed. Reviewed importance of monitoring symptoms daily. Advised to contact a provider and make an appointment if experiencing moderate symptoms for greater than 48 hours without improvement.  Provided information regarding infection prevention and increased risk r/t COPD. Advised to utilize prevention strategies to reduce risk of respiratory infection. Reviewed worsening symptoms that require immediate medical attention.   Symptom Management: Call pharmacy for medication refills 3-7 days in advance of running out of medications Identify and remove indoor air pollutants Do breathing exercises every day Eliminate symptom triggers at home Practice relaxation or meditation daily Consider plan for smoking cessation Follow rescue plan if symptoms flare-up Call provider office for new concerns or questions   Follow Up Plan:   Will follow up next month       Goal: CCM (Diabetes) Expected Outcome:  Monitor, Self-Manage And Reduce Symptoms of Diabetes       Current Barriers:  Chronic Disease Management support and education needs related to Diabetes  Planned Interventions: Reviewed provider's plan for Diabetes Management. Reviewed medications and discussed importance of medication  adherence.  Reports currently taking medications as prescribed. Denies current concerns related to  medication management or prescription cost. Agreed to update the care management team with changes. Reports currently not being required to monitor fasting blood glucose. A1C is currently within goal.  Reviewed s/sx of hypoglycemia and hyperglycemia along with recommended interventions. Reports episodes of fatigue that she attributes to radiation treatment. Denies hypoglycemic or hyperglycemic episodes. Discussed nutritional intake and importance of complying with a diabetic diet. Reports doing well with diet adherence. Notes decreased appetite since starting radiation within the last week. Thorough discussion regarding meal and snack options to increase intake of protein and maintain optimal glycemic levels.  Discussed importance of completing recommended DM preventive care. Advised to complete foot care and annual eye exams as recommended. Discussed importance of completing ordered labs as prescribed.  Assessed social determinant of health barriers 09/27/22: No Changes   Lab Results  Component Value Date   HGBA1C 6.1 (H) 04/11/2022    Symptom Management: Take medications as prescribed   Attend all scheduled provider appointments Call pharmacy for medication refills 3-7 days in advance of running out of medications Keep appointment with eye doctor Check feet daily for cuts, sores or redness Wash and dry feet carefully every day Wear comfortable, cotton socks Wear comfortable, well-fitting shoes Continue reading nutrition labels and adhering to recommended diabetic diet. Call provider office for new concerns or questions   Follow Up Plan:  Will follow up next month                    PLAN A member of the care management team will follow up next month.   Horris Latino RN Care Manager/Chronic Care Management [phone]

## 2022-10-02 DIAGNOSIS — Z170 Estrogen receptor positive status [ER+]: Secondary | ICD-10-CM

## 2022-10-02 DIAGNOSIS — C50912 Malignant neoplasm of unspecified site of left female breast: Secondary | ICD-10-CM

## 2022-10-02 DIAGNOSIS — E1159 Type 2 diabetes mellitus with other circulatory complications: Secondary | ICD-10-CM

## 2022-10-02 DIAGNOSIS — J449 Chronic obstructive pulmonary disease, unspecified: Secondary | ICD-10-CM

## 2022-10-09 ENCOUNTER — Other Ambulatory Visit: Payer: Self-pay | Admitting: Internal Medicine

## 2022-10-09 DIAGNOSIS — C50412 Malignant neoplasm of upper-outer quadrant of left female breast: Secondary | ICD-10-CM

## 2022-10-09 DIAGNOSIS — Z5181 Encounter for therapeutic drug level monitoring: Secondary | ICD-10-CM

## 2022-10-11 ENCOUNTER — Other Ambulatory Visit: Payer: Self-pay | Admitting: Family Medicine

## 2022-10-11 DIAGNOSIS — F419 Anxiety disorder, unspecified: Secondary | ICD-10-CM

## 2022-10-11 NOTE — Telephone Encounter (Signed)
Requested medication (s) are due for refill today: yes  Requested medication (s) are on the active medication list: yes  Last refill:  08/24/22  Future visit scheduled: yes  Notes to clinic:  Unable to refill per protocol, cannot delegate.      Requested Prescriptions  Pending Prescriptions Disp Refills   clonazePAM (KLONOPIN) 0.5 MG tablet [Pharmacy Med Name: clonazePAM 0.5 MG Oral Tablet] 90 tablet     Sig: TAKE ONE-HALF TABLET BY MOUTH 3  TIMES DAILY AS NEEDED FOR  ANXIETY     Not Delegated - Psychiatry: Anxiolytics/Hypnotics 2 Failed - 10/11/2022 12:53 PM      Failed - This refill cannot be delegated      Failed - Urine Drug Screen completed in last 360 days      Passed - Patient is not pregnant      Passed - Valid encounter within last 6 months    Recent Outpatient Visits           2 months ago COPD with hypoxia [facility])   [place] Gwyneth Sprout, FNP   6 months ago Annual physical exam   [facility] Gwyneth Sprout, FNP   1 year ago Change in stool   [facility] Gwyneth Sprout, FNP   1 year ago [place] Gwyneth Sprout, FNP   1 year ago [place] Gwyneth Sprout, [place]

## 2022-10-24 ENCOUNTER — Ambulatory Visit
Admission: RE | Admit: 2022-10-24 | Discharge: 2022-10-24 | Disposition: A | Discharge status: Home / Self Care | Payer: 59 | Source: Ambulatory Visit | Attending: Radiation Oncology | Admitting: Radiation Oncology

## 2022-10-24 ENCOUNTER — Encounter: Payer: Self-pay | Admitting: Radiation Oncology

## 2022-10-24 VITALS — BP 128/68 | HR 83 | Temp 97.7°F | Resp 20 | Ht 67.0 in | Wt 161.0 lb

## 2022-10-24 DIAGNOSIS — Z79811 Long term (current) use of aromatase inhibitors: Secondary | ICD-10-CM | POA: Insufficient documentation

## 2022-10-24 DIAGNOSIS — Z170 Estrogen receptor positive status [ER+]: Secondary | ICD-10-CM | POA: Diagnosis not present

## 2022-10-24 DIAGNOSIS — C50412 Malignant neoplasm of upper-outer quadrant of left female breast: Secondary | ICD-10-CM | POA: Insufficient documentation

## 2022-10-24 DIAGNOSIS — Z923 Personal history of irradiation: Secondary | ICD-10-CM | POA: Insufficient documentation

## 2022-10-24 NOTE — Progress Notes (Signed)
Radiation Oncology Follow up Note  Name: Alexandria [name]   Date:   10/24/2022 MRN:  [id number] DOB: 1955-12-25    This 67 y.o. 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  Seen today in routine follow-up she is doing well.  She specifically denies breast tenderness cough or bone pain..  She has been started on Femara tolerating it well without side effect.  COMPLICATIONS OF TREATMENT: none  FOLLOW UP COMPLIANCE: keeps appointments   PHYSICAL EXAM:  BP 128/68 (BP Location: Right Arm, Patient Position: Sitting, Cuff Size: Small)   Pulse 83   Temp 97.7 F (36.5 C) (Tympanic)   Resp 20   Ht 5' 7"$  (1.702 m) Comment: Stated HT  Wt 161 lb (73 kg)   BMI 25.22 kg/m  Lungs are clear to A&P cardiac examination essentially unremarkable with regular rate and rhythm. No dominant mass or nodularity is noted in either breast in 2 positions examined. Incision is well-healed. No axillary or supraclavicular adenopathy is appreciated. Cosmetic result is excellent.  Still some slight hyperpigmentation of the skin of the left breast.  Well-developed well-nourished patient in NAD. HEENT reveals PERLA, EOMI, discs not visualized.  Oral cavity is clear. No oral mucosal lesions are identified. Neck is clear without evidence of cervical or supraclavicular adenopathy. Lungs are clear to A&P. Cardiac examination is essentially unremarkable with regular rate and rhythm without murmur rub or thrill. Abdomen is benign with no organomegaly or masses noted. Motor sensory and DTR levels are equal and symmetric in the upper and lower extremities. Cranial nerves II through XII are grossly intact. Proprioception  is intact. No peripheral adenopathy or edema is identified. No motor or sensory levels are noted. Crude visual fields are within normal range.  RADIOLOGY RESULTS: No current films for review  PLAN: Present time patient is doing well 1 month out from whole breast radiation and pleased with her overall progress.  She continues on Femara without side effect.  I have asked to see her back in 6 months for follow-up.  Patient is to call with any concerns.  I would like to take this opportunity to thank you for allowing me to participate in the care of your patient.Noreene Filbert, MD

## 2022-10-29 ENCOUNTER — Inpatient Hospital Stay: Payer: 59 | Attending: Internal Medicine | Admitting: Internal Medicine

## 2022-10-29 ENCOUNTER — Encounter: Payer: Self-pay | Admitting: Internal Medicine

## 2022-10-29 VITALS — BP 124/75 | HR 80 | Temp 97.0°F | Wt 161.0 lb

## 2022-10-29 DIAGNOSIS — E119 Type 2 diabetes mellitus without complications: Secondary | ICD-10-CM | POA: Diagnosis not present

## 2022-10-29 DIAGNOSIS — I10 Essential (primary) hypertension: Secondary | ICD-10-CM | POA: Insufficient documentation

## 2022-10-29 DIAGNOSIS — Z79811 Long term (current) use of aromatase inhibitors: Secondary | ICD-10-CM | POA: Diagnosis not present

## 2022-10-29 DIAGNOSIS — C50412 Malignant neoplasm of upper-outer quadrant of left female breast: Secondary | ICD-10-CM | POA: Diagnosis not present

## 2022-10-29 DIAGNOSIS — E785 Hyperlipidemia, unspecified: Secondary | ICD-10-CM | POA: Insufficient documentation

## 2022-10-29 DIAGNOSIS — Z7984 Long term (current) use of oral hypoglycemic drugs: Secondary | ICD-10-CM | POA: Diagnosis not present

## 2022-10-29 DIAGNOSIS — Z170 Estrogen receptor positive status [ER+]: Secondary | ICD-10-CM | POA: Insufficient documentation

## 2022-10-29 DIAGNOSIS — Z79899 Other long term (current) drug therapy: Secondary | ICD-10-CM | POA: Insufficient documentation

## 2022-10-29 NOTE — Progress Notes (Signed)
[place] CONSULT NOTE  Patient Care Team: Gwyneth Sprout, FNP as PCP - General (Family Medicine) Daiva Huge, RN as Oncology Nurse Navigator Neldon Labella, RN as Case Manager   CANCER STAGING   Cancer Staging  Breast cancer, left [facility]) Staging form: Breast, AJCC 8th Edition - Clinical stage from 06/26/2022: Stage IA (cT1b, cN0, cM0, G2, ER+, PR-, HER2-) - Signed by [name], MD on 07/01/2022 Stage prefix: Initial diagnosis Method of lymph node assessment: Clinical Nuclear grade: G2 Mitotic count score: Score 1 Histologic grading system: 3 grade system   ASSESSMENT & PLAN:  Alexandria [name] 67 y.o. 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  #Left breast IDC, ER+ PR- HER2-, Stage 1A -Detected on screening mammogram done on 05/15/2022. Biopsy specimen showed 28m invasive mammary cancer with grade 2.   - s/p lumpectomy with SLNB by Dr. PHampton Abboton 07/17/2022. Surgical pathology showed single focus of invasive carcinoma, size 5 mm, overall grade 2, DCIS present intermediate grade, margins negative, 0/3 lymph nodes negative. Oncotype DX showed recurrence score of 18. No benefit from chemotherapy.   - Completed RT to left breast on 09/26/22. Started on Letrozole Feb 1st. Tolerating well. Had mild joint aches and hot flashes 2-3 times per week. Some mood changes. Continue with ca and vit D. Exercise as tolerated.    - DEXA scan done on 09/16/22 was normal. Repeat in 2 years.   #Genetics - declined.   #HLD- on rosuvastatin   #HTN - on amlodipine, HCTZ  #DM - on metformin   Orders Placed This Encounter  Procedures   CBC with Differential/Platelet    Standing Status:   Future    Standing Expiration Date:   10/29/2023   Comprehensive metabolic panel    Standing Status:   Future    Standing  Expiration Date:   10/29/2023   RTC in 4 months for md visit, labs   The total time spent in the appointment was 30 minutes encounter with patients including review of chart and various tests results, discussions about plan of care and coordination of care plan   All questions were answered. The patient knows to call the clinic with any problems, questions or concerns. No barriers to learning was detected.  KJane Canary MD 2/27/202410:17 AM   HISTORY OF PRESENTING ILLNESS:  Alexandria [name] 67y.o. 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  INTERVAL HISTORY-  Patient seen today as follow up for letrozole toxicity check. Accompained with daughter Patient reports joint aches after starting letrozole. 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 Has hot flashes 2-3 times per week. Tolerable. Not affecting QOL. Reports moodiness. Completed RT. Did well.   I have reviewed her chart and materials related to her cancer extensively and collaborated history with the patient. Summary of oncologic history is as follows: Oncology History  Breast cancer, left ([facility]  05/15/2022 Mammogram   Screening mammogram-  FINDINGS: In the left breast, a possible asymmetry and distortion with calcifications warrant further evaluation. In the right breast, no findings suspicious for malignancy.   IMPRESSION: Further evaluation is suggested for possible asymmetry and distortion with calcifications in the left breast.  06/06/2022 Mammogram   Diagnostic mammogram and US IMPRESSION: 1. Left breast architectural distortion in the upper-outer quadrant posterior depth, without a sonographic correlate, is intermediate suspicion for malignancy. 2. Previously described asymmetry in the inner left breast does not persist  with additional views, consistent with superimposed fibroglandular tissue. 3. Incidentally visualized 3 mm benign simple cyst in the left breast 6 o'clock position.   RECOMMENDATION: Left breast stereotactic guided biopsy of the architectural distortion in the upper-outer quadrant (1 site).  [place] left axilla normal    06/26/2022 Pathology Results   DIAGNOSIS:  A. BREAST, LEFT UPPER OUTER QUADRANT, POSTERIOR DEPTH; STEREOTACTIC CORE  NEEDLE BIOPSY:  - INVASIVE MAMMARY CARCINOMA, NO SPECIAL TYPE.  - CALCIFICATIONS ASSOCIATED WITH BENIGN AND NEOPLASTIC MAMMARY ELEMENTS.   Size of invasive carcinoma: 5 mm in this sample  Histologic grade of invasive carcinoma: Grade 2                       Glandular/tubular differentiation score: 3                       Nuclear pleomorphism score: 2                       Mitotic rate score: 1                       Total score: 6  Ductal carcinoma in situ: Present, grade 2-3, with focal comedonecrosis  Lymphovascular invasion: Not identified   ADDENDUM:  CASE SUMMARY: BREAST BIOMARKER TESTS  Estrogen Receptor (ER) Status: POSITIVE          Percentage of cells with nuclear positivity: Greater than 90%          Average intensity of staining: Strong   Progesterone Receptor (PgR) Status: NEGATIVE (LESS THAN 1%)          Internal control cells present and stain as expected   HER2 (by immunohistochemistry): NEGATIVE (Score 1+)  Ki-67: Not performed     07/17/2022 Definitive Surgery   Lumpectomy with SLNB with Dr. Hampton Abbot   TUMOR Histologic Type: Invasive carcinoma of no special type Histologic Grade ([place] Histologic Score)      Glandular (Acinar)/Tubular Differentiation: 3      Nuclear Pleomorphism: 2      Mitotic Rate: 1      Overall Grade: Grade 2 Tumor Size: 5 mm Tumor Focality: Single focus of invasive carcinoma Ductal Carcinoma In Situ (DCIS): Present, intermediate grade Tumor Extent: Not applicable Lymphatic and/or Vascular  Invasion: Not identified Treatment Effect in the Breast: No known presurgical therapy  MARGINS Margin Status for Invasive Carcinoma: All margins negative for invasive carcinoma      Distance from closest margin: At least 5 mm      Specify closest margin: All surgical margins  Margin Status for DCIS: All margins negative for DCIS      Distance from DCIS to closest margin: At least 5 mm      Specify closest margin: All surgical margins  REGIONAL LYMPH NODES Regional Lymph Node Status: All regional lymph nodes negative for tumor      Total Number of Lymph Nodes Examined (sentinel and non-sentinel): 3       Number of Sentinel Nodes Examined: 3  DISTANT METASTASIS Distant Site(s) Involved, if applicable: Not applicable  PATHOLOGIC STAGE CLASSIFICATION (pTNM, AJCC 8th Edition): Modified Classification: Not applicable pT Category: pT1a T Suffix: Not  applicable pN Category: pN0 N Suffix: sn pM Category: Not applicable  SPECIAL STUDIES Breast Biomarker Testing Performed on Previous Biopsy: [id number] Estrogen Receptor (ER) Status: POSITIVE         Percentage of cells with nuclear positivity: Greater than 90%         Average intensity of staining: Strong  Progesterone Receptor (PgR) Status: NEGATIVE (LESS THAN 1%)         Internal control cells present and stain as expected  HER2 (by immunohistochemistry): NEGATIVE (Score 1+) Ki-67: Not performed     Menarche age 64 Children 3 Age at first birth 42 Birth control OCP less than 5 years Menopause age 83 HRT no History of breast biopsies no Family history not significant  MEDICAL HISTORY:  Past Medical History:  Diagnosis Date   Anxiety    Cancer ([place])    Carbuncle and furuncle of trunk    COPD (chronic obstructive pulmonary disease) (HCC)    Depression    Diabetes mellitus without complication (HCC)    Dyspnea    GERD (gastroesophageal reflux disease)    HTN (hypertension)    Hyperlipidemia    Intrinsic asthma,  unspecified    Methicillin resistant Staphylococcus aureus in conditions classified elsewhere and of unspecified site    Other psychological or physical stress, not elsewhere classified(V62.89)    Pneumonia    Pre-diabetes    Tobacco abuse     SURGICAL HISTORY: Past Surgical History:  Procedure Laterality Date   BREAST BIOPSY Left 06/26/2022   stereo biopsy/ coil clip/ path pending   BREAST LUMPECTOMY,RADIO FREQ [place] Left 07/17/2022   Procedure: BREAST LUMPECTOMY,RADIO FREQ LOCALIZER,AXILLARY SENTINEL LYMPH NODE BIOPSY;  Surgeon: Olean Ree, MD;  Location: [facility];  Service: General;  Laterality: Left;   COLONOSCOPY WITH PROPOFOL N/A 04/10/2015   Procedure: COLONOSCOPY WITH PROPOFOL;  Surgeon: Lucilla Lame, MD;  Location: [place];  Service: Endoscopy;  Laterality: N/A;   COLONOSCOPY WITH PROPOFOL N/A 06/25/2021   Procedure: COLONOSCOPY WITH PROPOFOL;  Surgeon: Lucilla Lame, MD;  Location: [place];  Service: Endoscopy;  Laterality: N/A;  leave at 8 arrival   ESOPHAGOGASTRODUODENOSCOPY (EGD) WITH PROPOFOL N/A 12/06/2021   Procedure: ESOPHAGOGASTRODUODENOSCOPY (EGD) WITH PROPOFOL;  Surgeon: Lucilla Lame, MD;  Location: [place];  Service: Endoscopy;  Laterality: N/A;   POLYPECTOMY  04/10/2015   Procedure: POLYPECTOMY;  Surgeon: Lucilla Lame, MD;  Location: [place];  Service: Endoscopy;;   POLYPECTOMY N/A 06/25/2021   Procedure: POLYPECTOMY;  Surgeon: Lucilla Lame, MD;  Location: [place];  Service: Endoscopy;  Laterality: N/A;   STATUS POST ENDOMETRIAL ABLATION     TONSILLECTOMY     TUBAL LIGATION     bilateral, sterile ablation   VARICOSE VEINS      SOCIAL HISTORY: Social History   Socioeconomic History   Marital status: Widowed    Spouse name: Not on file   Number of children: 3   Years of education: 12   Highest education level: Not on file  Occupational History   Occupation:  [employer]    Comment: CNA  Tobacco Use   Smoking status: Every Day    Packs/day: 0.50    Years: 30.00    Total pack years: 15.00    Types: Cigarettes    Last attempt to quit: 01/31/2017    Years since quitting: 5.7   Smokeless tobacco: Never   Tobacco comments:    Currently smoking 10 cigarettes a day  Vaping Use   Vaping Use: Never used  Substance and Sexual Activity   Alcohol use: No    Alcohol/week: 0.0 standard drinks of alcohol   Drug use: No   Sexual activity: Never  Other Topics Concern   Not on file  Social History Narrative   Lives alone - daughter stays with her most of the time   Social Determinants of Health   Financial Resource Strain: Low Risk  (08/29/2022)   Overall Financial Resource Strain (CARDIA)    Difficulty of Paying Living Expenses: Not very hard  Food Insecurity: No Food Insecurity (08/29/2022)   Hunger Vital Sign    Worried About Running Out of Food in the Last Year: Never true    Ran Out of Food in the Last Year: Never true  Transportation Needs: No Transportation Needs (08/29/2022)   PRAPARE - Hydrologist (Medical): No    Lack of Transportation (Non-Medical): No  Physical Activity: Inactive (08/29/2022)   Exercise Vital Sign    Days of Exercise per Week: 0 days    Minutes of Exercise per Session: 0 min  Stress: Stress Concern Present (08/29/2022)   [place]    Feeling of Stress : To some extent  Social Connections: Socially Isolated (08/29/2022)   Social Connection and Isolation Panel [NHANES]    Frequency of Communication with Friends and Family: More than three times a week    Frequency of Social Gatherings with Friends and Family: More than three times a week    Attends Religious Services: Never    Marine scientist or Organizations: No    Attends Archivist Meetings: Never    Marital Status: Widowed   Intimate Partner Violence: Not At Risk (07/29/2022)   Humiliation, Afraid, Rape, and Kick questionnaire    Fear of Current or Ex-Partner: No    Emotionally Abused: No    Physically Abused: No    Sexually Abused: No    FAMILY HISTORY: Family History  Problem Relation Age of Onset   Coronary artery disease Mother    Hypertension Mother    Rheum arthritis Mother    Heart attack Mother    Lung cancer Father    Emphysema Father    Gout Father    Diabetes Sister    Hypertension Daughter    Hypertension Daughter    Obesity Daughter    Seizures Daughter    Breast cancer Neg Hx    Bladder Cancer Neg Hx    Kidney cancer Neg Hx     ALLERGIES:  is allergic to pravastatin sodium.  MEDICATIONS:  Current Outpatient Medications  Medication Sig Dispense Refill   acetaminophen (TYLENOL) 500 MG tablet Take 1,000 mg by mouth every 6 (six) hours as needed for mild pain.     albuterol (VENTOLIN HFA) 108 (90 Base) MCG/ACT inhaler Inhale 1-2 puffs into the lungs every 6 (six) hours as needed for wheezing or shortness of breath. 18 g 11   amLODipine (NORVASC) 10 MG tablet Take 1 tablet (10 mg total) by mouth daily. 90 tablet 3   cetirizine (ZYRTEC) 10 MG tablet Take 1 tablet (10 mg total) by mouth daily. 30 tablet 11   clonazePAM (KLONOPIN) 0.5 MG tablet TAKE ONE-HALF TABLET BY MOUTH 3  TIMES DAILY AS NEEDED FOR  ANXIETY 90 tablet 0   famotidine (PEPCID) 20 MG tablet Take 20 mg by mouth as needed for heartburn.  hydrochlorothiazide (HYDRODIURIL) 25 MG tablet TAKE 1 TABLET BY MOUTH DAILY 60 tablet 5   letrozole (FEMARA) 2.5 MG tablet TAKE 1 TABLET BY MOUTH DAILY  (START 1 WEEK AFTER COMPLETION  OF RADIATION) 90 tablet 3   loperamide (IMODIUM) 2 MG capsule TAKE 1 CAPSULE BY MOUTH 4 TIMES  DAILY AS NEEDED FOR DIARRHEA OR  LOOSE STOOLS 60 capsule 0   metFORMIN (GLUCOPHAGE-XR) 750 MG 24 hr tablet TAKE 1 TABLET BY MOUTH DAILY  WITH BREAKFAST 100 tablet 3   MULTIPLE VITAMIN PO Take 1 tablet by mouth  daily. Reported on 03/13/2016     ondansetron (ZOFRAN-ODT) 4 MG disintegrating tablet Take 1 tablet (4 mg total) by mouth every 8 (eight) hours as needed for nausea or vomiting. 20 tablet 0   rosuvastatin (CRESTOR) 40 MG tablet Take 1 tablet (40 mg total) by mouth daily. 90 tablet 3   Spacer/Aero-Holding Chambers (AEROCHAMBER MV) inhaler Use as instructed 1 each 1   TRELEGY ELLIPTA 100-62.5-25 MCG/ACT AEPB USE 1 INHALATION BY MOUTH DAILY 120 each 5   benzonatate (TESSALON) 100 MG capsule Take 1 capsule (100 mg total) by mouth 3 (three) times daily as needed. (Patient not taking: Reported on 08/29/2022) 30 capsule 0   ibuprofen (ADVIL) 600 MG tablet Take 1 tablet (600 mg total) by mouth every 8 (eight) hours as needed for moderate pain. (Patient not taking: Reported on 08/12/2022) 60 tablet 1   No current facility-administered medications for this visit.    REVIEW OF SYSTEMS:   Pertinent information mentioned in HPI All other systems were reviewed with the patient and are negative.  PHYSICAL EXAMINATION: ECOG PERFORMANCE STATUS: 1 - Symptomatic but completely ambulatory  Vitals:   10/29/22 1010  BP: 124/75  Pulse: 80  Temp: (!) 97 F (36.1 C)  SpO2: 98%     Filed Weights   10/29/22 1010  Weight: 161 lb (73 kg)      GENERAL:alert, no distress and comfortable SKIN: skin color, texture, turgor are normal, no rashes or significant lesions EYES: normal, conjunctiva are pink and non-injected, sclera clear OROPHARYNX:no exudate, no erythema and lips, buccal mucosa, and tongue normal  NECK: supple, thyroid normal size, non-tender, without nodularity LYMPH:  no palpable lymphadenopathy in the cervical, axillary or inguinal LUNGS: clear to auscultation and percussion with normal breathing effort HEART: regular rate & rhythm and no murmurs and no lower extremity edema ABDOMEN:abdomen soft, non-tender and normal bowel sounds Musculoskeletal:no cyanosis of digits and no clubbing  PSYCH:  alert & oriented x 3 with fluent speech NEURO: no focal motor/sensory deficits  LABORATORY DATA:  I have reviewed the data as listed Lab Results  Component Value Date   WBC 6.2 09/20/2022   HGB 14.8 09/20/2022   HCT 44.1 09/20/2022   MCV 87.2 09/20/2022   PLT 255 09/20/2022   Recent Labs    04/11/22 1048 07/28/22 1140 07/29/22 0435 07/30/22 0449 07/31/22 0549 08/01/22 0538  NA 140 137   < > 139 137 139  K 4.1 3.1*   < > 4.0 3.3* 3.7  CL 97 98   < > 105 104 108  CO2 27 28   < > '27 26 24  '$ GLUCOSE 103* 111*   < > 113* 92 98  BUN 6* 14   < > '12 16 16  '$ CREATININE 0.69 0.71   < > 0.67 0.54 0.55  CALCIUM 10.3 10.0   < > 10.0 9.5 9.7  GFRNONAA  --  >60   < > >  60 >60 >60  PROT 6.8 7.3  --   --   --   --   ALBUMIN 4.6 3.9  --   --   --   --   AST 17 21  --   --   --   --   ALT 21 21  --   --   --   --   ALKPHOS 135* 96  --   --   --   --   BILITOT 0.5 0.9  --   --   --   --    < > = values in this interval not displayed.    RADIOGRAPHIC STUDIES: I have personally reviewed the radiological images as listed and agreed with the findings in the report. No results found.

## 2022-10-31 ENCOUNTER — Telehealth: Payer: 59

## 2022-10-31 ENCOUNTER — Telehealth: Payer: Self-pay

## 2022-10-31 NOTE — Telephone Encounter (Signed)
   CCM RN Visit Note   10/31/22 Name: ZIOMARA TRINGALI MRN: [id number]      DOB: 06/07/1956  Subjective: Quenten Raven Tramell is a 67 y.o. year old female who is a primary care patient of Gwyneth Sprout, FNP. The patient was referred to the Chronic Care Management team for assistance with care management needs subsequent to provider initiation of CCM services and plan of care.      An unsuccessful outreach attempt was made today for a scheduled CCM visit.   PLAN: A HIPAA compliant phone message was left for the patient providing contact information and requesting a return call.   Horris Latino RN Care Manager/Chronic Care Management [phone]

## 2022-11-25 NOTE — Telephone Encounter (Unsigned)
Copied from [place] [phone]. Topic: General - Other >> Nov 25, 2022 11:37 AM Carrielelia G wrote: Reason for IC:[id number] ELLIPTA 100-62.5-25 MCG/ACT AEPB  too expensive.  Please advise

## 2022-11-26 ENCOUNTER — Telehealth: Payer: Self-pay | Admitting: Pharmacist

## 2022-11-26 NOTE — Progress Notes (Unsigned)
[facility])                                            [name] Team    11/26/2022  Alexandria [name] [birth year] [id number]  Patient was called regarding medication assistance.  HIPAA identifiers were obtained. After explaining the purpose of my call, the patient said she no longer needed our services. She explained that she is dually eligible and there must have been a glitch in the system.  She said she called [facility] and was able to get everything worked out and her copay for Trelegy is now $0.00   Plan: Close [employer] case.  Elayne Guerin, PharmD, [place] Clinical Pharmacist [phone]

## 2022-12-04 ENCOUNTER — Other Ambulatory Visit: Payer: Self-pay | Admitting: Family Medicine

## 2022-12-04 DIAGNOSIS — F419 Anxiety disorder, unspecified: Secondary | ICD-10-CM

## 2023-01-19 ENCOUNTER — Other Ambulatory Visit: Payer: Self-pay | Admitting: Family Medicine

## 2023-01-19 DIAGNOSIS — F419 Anxiety disorder, unspecified: Secondary | ICD-10-CM

## 2023-01-20 DIAGNOSIS — Z872 Personal history of diseases of the skin and subcutaneous tissue: Secondary | ICD-10-CM | POA: Diagnosis not present

## 2023-01-20 DIAGNOSIS — L578 Other skin changes due to chronic exposure to nonionizing radiation: Secondary | ICD-10-CM | POA: Diagnosis not present

## 2023-01-20 DIAGNOSIS — D485 Neoplasm of uncertain behavior of skin: Secondary | ICD-10-CM | POA: Diagnosis not present

## 2023-01-20 DIAGNOSIS — D2272 Melanocytic nevi of left lower limb, including hip: Secondary | ICD-10-CM | POA: Diagnosis not present

## 2023-01-20 DIAGNOSIS — D225 Melanocytic nevi of trunk: Secondary | ICD-10-CM | POA: Diagnosis not present

## 2023-01-20 DIAGNOSIS — L821 Other seborrheic keratosis: Secondary | ICD-10-CM | POA: Diagnosis not present

## 2023-01-20 DIAGNOSIS — I781 Nevus, non-neoplastic: Secondary | ICD-10-CM | POA: Diagnosis not present

## 2023-01-21 NOTE — Telephone Encounter (Signed)
LVMTCB. CRM created. Ok for [facility] to advise appointment is needed for further refills. Ok for virtual per [employer]

## 2023-01-22 NOTE — Progress Notes (Unsigned)
MyChart Video Visit    Virtual Visit via Video Note   This format is felt to be most appropriate for this patient at this time. Physical exam was limited by quality of the video and audio technology used for the visit.   Patient location: *** Provider location: [facility]  I discussed the limitations of evaluation and management by telemedicine and the availability of in person appointments. The patient expressed understanding and agreed to proceed.  Patient: Alexandria [name]   DOB: 1955/09/30   67 y.o. Female  MRN: [id number] Visit Date: 01/23/2023  Today's healthcare provider: Jacky Kindle, FNP   No chief complaint on file.  Subjective    HPI  ***   Medications: Outpatient Medications Prior to Visit  Medication Sig   acetaminophen (TYLENOL) 500 MG tablet Take 1,000 mg by mouth every 6 (six) hours as needed for mild pain.   albuterol (VENTOLIN HFA) 108 (90 Base) MCG/ACT inhaler Inhale 1-2 puffs into the lungs every 6 (six) hours as needed for wheezing or shortness of breath.   amLODipine (NORVASC) 10 MG tablet Take 1 tablet (10 mg total) by mouth daily.   benzonatate (TESSALON) 100 MG capsule Take 1 capsule (100 mg total) by mouth 3 (three) times daily as needed. (Patient not taking: Reported on 08/29/2022)   cetirizine (ZYRTEC) 10 MG tablet Take 1 tablet (10 mg total) by mouth daily.   clonazePAM (KLONOPIN) 0.5 MG tablet TAKE ONE-HALF TABLET BY MOUTH 3  TIMES DAILY AS NEEDED FOR  ANXIETY   famotidine (PEPCID) 20 MG tablet Take 20 mg by mouth as needed for heartburn.   hydrochlorothiazide (HYDRODIURIL) 25 MG tablet TAKE 1 TABLET BY MOUTH DAILY   ibuprofen (ADVIL) 600 MG tablet Take 1 tablet (600 mg total) by mouth every 8 (eight) hours as needed for moderate pain. (Patient not taking: Reported on 08/12/2022)   letrozole (FEMARA) 2.5 MG tablet TAKE 1 TABLET BY MOUTH DAILY  (START 1 WEEK AFTER COMPLETION  OF RADIATION)   loperamide (IMODIUM) 2 MG capsule TAKE 1  CAPSULE BY MOUTH 4 TIMES  DAILY AS NEEDED FOR DIARRHEA OR  LOOSE STOOLS   metFORMIN (GLUCOPHAGE-XR) 750 MG 24 hr tablet TAKE 1 TABLET BY MOUTH DAILY  WITH BREAKFAST   MULTIPLE VITAMIN PO Take 1 tablet by mouth daily. Reported on 03/13/2016   ondansetron (ZOFRAN-ODT) 4 MG disintegrating tablet Take 1 tablet (4 mg total) by mouth every 8 (eight) hours as needed for nausea or vomiting.   rosuvastatin (CRESTOR) 40 MG tablet Take 1 tablet (40 mg total) by mouth daily.   Spacer/Aero-Holding Chambers (AEROCHAMBER MV) inhaler Use as instructed   TRELEGY ELLIPTA 100-62.5-25 MCG/ACT AEPB USE 1 INHALATION BY MOUTH DAILY   No facility-administered medications prior to visit.    Review of Systems  {Labs  Heme  Chem  Endocrine  Serology  Results Review (optional):23779}   Objective    There were no vitals taken for this visit.  {Show previous vital signs (optional):23777}   Physical Exam     Assessment & Plan     ***  No follow-ups on file.     I discussed the assessment and treatment plan with the patient. The patient was provided an opportunity to ask questions and all were answered. The patient agreed with the plan and demonstrated an understanding of the instructions.   The patient was advised to call back or seek an in-person evaluation if the symptoms worsen or if the condition fails to  improve as anticipated.  I provided *** minutes of non-face-to-face time during this encounter.  {provider attestation***:1}  Jacky Kindle, FNP [facility] [phone] (phone) [phone] (fax)  [facility]

## 2023-01-23 ENCOUNTER — Telehealth (INDEPENDENT_AMBULATORY_CARE_PROVIDER_SITE_OTHER): Payer: 59 | Admitting: Family Medicine

## 2023-01-23 ENCOUNTER — Encounter: Payer: Self-pay | Admitting: Family Medicine

## 2023-01-23 ENCOUNTER — Telehealth: Payer: Self-pay

## 2023-01-23 DIAGNOSIS — Z170 Estrogen receptor positive status [ER+]: Secondary | ICD-10-CM | POA: Diagnosis not present

## 2023-01-23 DIAGNOSIS — F419 Anxiety disorder, unspecified: Secondary | ICD-10-CM | POA: Diagnosis not present

## 2023-01-23 DIAGNOSIS — C50912 Malignant neoplasm of unspecified site of left female breast: Secondary | ICD-10-CM

## 2023-01-23 DIAGNOSIS — F4322 Adjustment disorder with anxiety: Secondary | ICD-10-CM | POA: Diagnosis not present

## 2023-01-23 MED ORDER — CLONAZEPAM 0.5 MG PO TABS: ORAL_TABLET | ORAL | 0 refills | Status: DC

## 2023-01-23 NOTE — Assessment & Plan Note (Signed)
Ongoing tx; dx in 06/2022- Continue to decline use of additional controller medication to assist; ex SSRI with situational stressors  Continue prn klonopin Upcoming office appt has been scheduled  Continues to have multiple appts for surgery and oncology teams

## 2023-01-23 NOTE — Telephone Encounter (Signed)
Contacted patient to triage before virtual visit at 9 am with Merita Norton, FNP.  CRM created. Ok for [facility] to verify what visit is in regards too and direct call to [place], [place]

## 2023-01-23 NOTE — Assessment & Plan Note (Signed)
Acute on chronic, worsening since October with breast cancer dx Continues to decline daily controller medication PDMP reviewed Upcoming office appt scheduled Discussed necessary wean as oncology treatment slows

## 2023-02-02 ENCOUNTER — Other Ambulatory Visit: Payer: Self-pay | Admitting: Family Medicine

## 2023-02-02 DIAGNOSIS — I10 Essential (primary) hypertension: Secondary | ICD-10-CM

## 2023-02-03 ENCOUNTER — Ambulatory Visit: Payer: Medicare Other | Admitting: Surgery

## 2023-02-03 NOTE — Telephone Encounter (Signed)
Requested Prescriptions  Pending Prescriptions Disp Refills   hydrochlorothiazide (HYDRODIURIL) 25 MG tablet [Pharmacy Med Name: hydroCHLOROthiazide 25 MG Oral Tablet] 100 tablet 0    Sig: TAKE 1 TABLET BY MOUTH DAILY     Cardiovascular: Diuretics - Thiazide Failed - 02/02/2023  5:16 AM      Failed - Cr in normal range and within 180 days    Creatinine  Date Value Ref Range Status  12/26/2014 0.53 mg/dL Final    Comment:    1.61-0.96 NOTE: New Reference Range  11/08/14    Creatinine, Ser  Date Value Ref Range Status  08/01/2022 0.55 0.44 - 1.00 mg/dL Final         Failed - K in normal range and within 180 days    Potassium  Date Value Ref Range Status  08/01/2022 3.7 3.5 - 5.1 mmol/L Final  12/25/2014 3.6 mmol/L Final    Comment:    3.5-5.1 NOTE: New Reference Range  11/08/14          Failed - Na in normal range and within 180 days    Sodium  Date Value Ref Range Status  08/01/2022 139 135 - 145 mmol/L Final  04/11/2022 140 134 - 144 mmol/L Final  12/25/2014 133 (L) mmol/L Final    Comment:    135-145 NOTE: New Reference Range  11/08/14          Passed - Last BP in normal range    BP Readings from Last 1 Encounters:  10/29/22 124/75         Passed - Valid encounter within last 6 months    Recent Outpatient Visits           1 week ago Adjustment disorder with anxious mood   [place] [facility] Merita Norton T, FNP   5 months ago COPD with hypoxia [facility])   [place] [facility] Jacky Kindle, FNP   9 months ago Annual physical exam   [facility] Jacky Kindle, FNP   1 year ago Change in stool   [facility] Jacky Kindle, FNP   1 year ago Prediabetes   [facility] Merita Norton T, FNP               amLODipine (NORVASC) 10 MG tablet [Pharmacy Med Name: amLODIPine Besylate 10 MG Oral Tablet] 100 tablet 0    Sig: TAKE 1 TABLET BY MOUTH  DAILY     Cardiovascular: Calcium Channel Blockers 2 Passed - 02/02/2023  5:16 AM      Passed - Last BP in normal range    BP Readings from Last 1 Encounters:  10/29/22 124/75         Passed - Last Heart Rate in normal range    Pulse Readings from Last 1 Encounters:  10/29/22 80         Passed - Valid encounter within last 6 months    Recent Outpatient Visits           1 week ago Adjustment disorder with anxious mood   [place] [facility] Merita Norton T, FNP   5 months ago COPD with hypoxia [facility])   [facility] Jacky Kindle, FNP   9 months ago Annual physical exam   [facility] Jacky Kindle, FNP   1 year ago Change in stool    [facility]  Jacky Kindle, FNP   1 year ago Prediabetes   [facility] Jacky Kindle, [place]

## 2023-02-05 ENCOUNTER — Ambulatory Visit (INDEPENDENT_AMBULATORY_CARE_PROVIDER_SITE_OTHER): Payer: 59 | Admitting: Surgery

## 2023-02-05 ENCOUNTER — Encounter: Payer: Self-pay | Admitting: Surgery

## 2023-02-05 VITALS — BP 122/75 | HR 76 | Temp 98.0°F | Ht 67.0 in | Wt 168.0 lb

## 2023-02-05 DIAGNOSIS — Z170 Estrogen receptor positive status [ER+]: Secondary | ICD-10-CM

## 2023-02-05 DIAGNOSIS — C50412 Malignant neoplasm of upper-outer quadrant of left female breast: Secondary | ICD-10-CM

## 2023-02-05 DIAGNOSIS — Z08 Encounter for follow-up examination after completed treatment for malignant neoplasm: Secondary | ICD-10-CM | POA: Diagnosis not present

## 2023-02-05 NOTE — Progress Notes (Signed)
02/05/2023  History of Present Illness: Alexandria [name] is a 67 y.o. female s/p left breast lumpectomy and SLNBx on 07/17/22 for breast cancer.  She has completed radiation and is on Letrozole.  She presents today for follow up.  She reports that she's doing well but feels soreness/pain at the left nipple, particularly when she's doing self breast exams.  Denies any other issues.    Past Medical History: Past Medical History:  Diagnosis Date   Anxiety    Cancer (HCC)    Carbuncle and furuncle of trunk    COPD (chronic obstructive pulmonary disease) (HCC)    Depression    Diabetes mellitus without complication (HCC)    Dyspnea    GERD (gastroesophageal reflux disease)    HTN (hypertension)    Hyperlipidemia    Intrinsic asthma, unspecified    Methicillin resistant Staphylococcus aureus in conditions classified elsewhere and of unspecified site    Other psychological or physical stress, not elsewhere classified(V62.89)    Pneumonia    Pre-diabetes    Tobacco abuse      Past Surgical History: Past Surgical History:  Procedure Laterality Date   BREAST BIOPSY Left 06/26/2022   stereo biopsy/ coil clip/ path pending   BREAST LUMPECTOMY,RADIO FREQ LOCALIZER,AXILLARY SENTINEL LYMPH NODE BIOPSY Left 07/17/2022   Procedure: BREAST LUMPECTOMY,RADIO FREQ LOCALIZER,AXILLARY SENTINEL LYMPH NODE BIOPSY;  Surgeon: Henrene Dodge, MD;  Location: [facility] ORS;  Service: General;  Laterality: Left;   COLONOSCOPY WITH PROPOFOL N/A 04/10/2015   Procedure: COLONOSCOPY WITH PROPOFOL;  Surgeon: Midge Minium, MD;  Location: [facility];  Service: Endoscopy;  Laterality: N/A;   COLONOSCOPY WITH PROPOFOL N/A 06/25/2021   Procedure: COLONOSCOPY WITH PROPOFOL;  Surgeon: Midge Minium, MD;  Location: [facility];  Service: Endoscopy;  Laterality: N/A;  leave at 8 arrival   ESOPHAGOGASTRODUODENOSCOPY (EGD) WITH PROPOFOL N/A 12/06/2021   Procedure: ESOPHAGOGASTRODUODENOSCOPY (EGD) WITH PROPOFOL;  Surgeon:  Midge Minium, MD;  Location: [facility];  Service: Endoscopy;  Laterality: N/A;   POLYPECTOMY  04/10/2015   Procedure: POLYPECTOMY;  Surgeon: Midge Minium, MD;  Location: [facility];  Service: Endoscopy;;   POLYPECTOMY N/A 06/25/2021   Procedure: POLYPECTOMY;  Surgeon: Midge Minium, MD;  Location: [facility];  Service: Endoscopy;  Laterality: N/A;   STATUS POST ENDOMETRIAL ABLATION     TONSILLECTOMY     TUBAL LIGATION     bilateral, sterile ablation   VARICOSE VEINS      Home Medications: Prior to Admission medications   Medication Sig Start Date End Date Taking? Authorizing Provider  acetaminophen (TYLENOL) 500 MG tablet Take 1,000 mg by mouth every 6 (six) hours as needed for mild pain.   Yes [provider]  albuterol (VENTOLIN HFA) 108 (90 Base) MCG/ACT inhaler Inhale 1-2 puffs into the lungs every 6 (six) hours as needed for wheezing or shortness of breath. 09/30/22  Yes Merita Norton T, FNP  amLODipine (NORVASC) 10 MG tablet TAKE 1 TABLET BY MOUTH DAILY 02/03/23  Yes Jacky Kindle, FNP  cetirizine (ZYRTEC) 10 MG tablet Take 1 tablet (10 mg total) by mouth daily. 01/13/20  Yes Burnette, Alessandra Bevels, PA-C  clonazePAM (KLONOPIN) 0.5 MG tablet TAKE ONE-HALF TABLET BY MOUTH 3  TIMES DAILY AS NEEDED FOR  ANXIETY 01/23/23  Yes Jacky Kindle, FNP  famotidine (PEPCID) 20 MG tablet Take 20 mg by mouth as needed for heartburn.   Yes [provider]  hydrochlorothiazide (HYDRODIURIL) 25 MG tablet TAKE 1 TABLET BY MOUTH  DAILY 02/03/23  Yes Merita Norton T, FNP  letrozole [facility]) 2.5 MG tablet TAKE 1 TABLET BY MOUTH DAILY  (START 1 WEEK AFTER COMPLETION  OF RADIATION) 10/09/22  Yes Alinda Dooms, NP  loperamide (IMODIUM) 2 MG capsule TAKE 1 CAPSULE BY MOUTH 4 TIMES  DAILY AS NEEDED FOR DIARRHEA OR  LOOSE STOOLS 07/29/22  Yes Merita Norton T, FNP  metFORMIN (GLUCOPHAGE-XR) 750 MG 24 hr tablet TAKE 1 TABLET BY MOUTH DAILY  WITH BREAKFAST 08/09/22  Yes Jacky Kindle,  FNP  ondansetron (ZOFRAN-ODT) 4 MG disintegrating tablet Take 1 tablet (4 mg total) by mouth every 8 (eight) hours as needed for nausea or vomiting. 07/22/22  Yes Margaretann Loveless, PA-C  rosuvastatin (CRESTOR) 40 MG tablet Take 1 tablet (40 mg total) by mouth daily. 04/12/22  Yes Jacky Kindle, FNP  Spacer/Aero-Holding Chambers (AEROCHAMBER MV) inhaler Use as instructed 07/24/22  Yes Domenick Gong, MD  TRELEGY ELLIPTA 100-62.5-25 MCG/ACT AEPB USE 1 INHALATION BY MOUTH DAILY 06/20/22  Yes Jacky Kindle, FNP  fluticasone [facility]) 50 MCG/ACT nasal spray Place 2 sprays into both nostrils daily. 12/09/19 01/06/20  Margaretann Loveless, PA-C    Allergies: Allergies  Allergen Reactions   Pravastatin Sodium     Myalgia    Review of Systems: Review of Systems  Constitutional:  Negative for chills and fever.  Respiratory:  Negative for shortness of breath.   Cardiovascular:  Negative for chest pain.  Gastrointestinal:  Negative for abdominal pain, nausea and vomiting.  Skin:        Soreness in the left nipple.    Physical Exam BP 122/75   Pulse 76   Temp 98 F (36.7 C)   Ht 5\' 7"  (1.702 m)   Wt 168 lb (76.2 kg)   SpO2 98%   BMI 26.31 kg/m  CONSTITUTIONAL: No acute distress, well nourished. HEENT:  Normocephalic, atraumatic, extraocular motion intact. RESPIRATORY:  Normal respiratory effort without pathologic use of accessory muscles. CARDIOVASCULAR: Regular rhythm and rate BREAST:  Left breast s/p lumpectomy in upper outer quadrant with incision well healed.  The patient has swelling at the nipple/areola and is sensitive to palpation.  No drainage, erythema, or evidence of infection.  No other skin change, palpable masses.  No left axillary lymphadenopathy.  Right breast without any palpable masses, skin changes, or nipple changes.  No right axillary lymphadenopathy. NEUROLOGIC:  Motor and sensation is grossly normal.  Cranial nerves are grossly intact. PSYCH:  Alert and oriented  to person, place and time. Affect is normal.  Assessment and Plan: This is a 67 y.o. female s/p left breast lumpectomy and SLNBx.  --Discussed with the patient that the swelling may be related to the surgery itself and lymph node biopsy, or could also be related to radiation changes.  This may improve with time, but unfortunately could also persist.  She may take tylenol or ibuprofen for pain control.  The pain is not constant and only when pushing on the nipple area for self exams. --Follow up in 6 months with mammogram.  I spent 20 minutes dedicated to the care of this patient on the date of this encounter to include pre-visit review of records, face-to-face time with the patient discussing diagnosis and management, and any post-visit coordination of care.   Howie Ill, MD [facility]

## 2023-02-05 NOTE — Patient Instructions (Addendum)
Bilateral mammogram due in October and office exam. We will send you a letter about these appointments.   Continue self breast exams. Call office for any new breast issues or concerns.   Breast Self-Awareness Breast self-awareness is knowing how your breasts look and feel. You need to: Check your breasts on a regular basis. Tell your doctor about any changes. Become familiar with the look and feel of your breasts. This can help you catch a breast problem while it is still small and can be treated. You should do breast self-exams even if you have breast implants. What you need: A mirror. A well-lit room. A pillow or other soft object. How to do a breast self-exam Follow these steps to do a breast self-exam: Look for changes  Take off all the clothes above your waist. Stand in front of a mirror in a room with good lighting. Put your hands down at your sides. Compare your breasts in the mirror. Look for any difference between them, such as: A difference in shape. A difference in size. Wrinkles, dips, and bumps in one breast and not the other. Look at each breast for changes in the skin, such as: Redness. Scaly areas. Skin that has gotten thicker. Dimpling. Open sores (ulcers). Look for changes in your nipples, such as: Fluid coming out of a nipple. Fluid around a nipple. Bleeding. Dimpling. Redness. A nipple that looks pushed in (retracted), or that has changed position. Feel for changes Lie on your back. Feel each breast. To do this: Pick a breast to feel. Place a pillow under the shoulder closest to that breast. Put the arm closest to that breast behind your head. Feel the nipple area of that breast using the hand of your other arm. Feel the area with the pads of your three middle fingers by making small circles with your fingers. Use light, medium, and firm pressure. Continue the overlapping circles, moving downward over the breast. Keep making circles with your fingers.  Stop when you feel your ribs. Start making circles with your fingers again, this time going upward until you reach your collarbone. Then, make circles outward across your breast and into your armpit area. Squeeze your nipple. Check for discharge and lumps. Repeat these steps to check your other breast. Sit or stand in the tub or shower. With soapy water on your skin, feel each breast the same way you did when you were lying down. Write down what you find Writing down what you find can help you remember what to tell your doctor. Write down: What is normal for each breast. Any changes you find in each breast. These include: The kind of changes you find. A tender or painful breast. Any lump you find. Write down its size and where it is. When you last had your monthly period (menstrual cycle). General tips If you are breastfeeding, the best time to check your breasts is after you feed your baby or after you use a breast pump. If you get monthly bleeding, the best time to check your breasts is 5-7 days after your monthly cycle ends. With time, you will become comfortable with the self-exam. You will also start to know if there are changes in your breasts. Contact a doctor if: You see a change in the shape or size of your breasts or nipples. You see a change in the skin of your breast or nipples, such as red or scaly skin. You have fluid coming from your nipples that is not normal.  You find a new lump or thick area. You have breast pain. You have any concerns about your breast health. Summary Breast self-awareness includes looking for changes in your breasts and feeling for changes within your breasts. You should do breast self-awareness in front of a mirror in a well-lit room. If you get monthly periods (menstrual cycles), the best time to check your breasts is 5-7 days after your period ends. Tell your doctor about any changes you see in your breasts. Changes include changes in size,  changes on the skin, painful or tender breasts, or fluid from your nipples that is not normal. This information is not intended to replace advice given to you by your health care provider. Make sure you discuss any questions you have with your health care provider. Document Revised: 01/24/2022 Document Reviewed: 06/21/2021 Elsevier Patient Education  2024 [employer].

## 2023-02-16 ENCOUNTER — Other Ambulatory Visit: Payer: Self-pay | Admitting: Family Medicine

## 2023-02-18 ENCOUNTER — Other Ambulatory Visit: Payer: Self-pay | Admitting: Family Medicine

## 2023-02-20 DIAGNOSIS — D2272 Melanocytic nevi of left lower limb, including hip: Secondary | ICD-10-CM | POA: Diagnosis not present

## 2023-02-20 DIAGNOSIS — D2372 Other benign neoplasm of skin of left lower limb, including hip: Secondary | ICD-10-CM | POA: Diagnosis not present

## 2023-02-27 ENCOUNTER — Inpatient Hospital Stay: Payer: 59

## 2023-02-27 ENCOUNTER — Inpatient Hospital Stay: Payer: 59 | Attending: Internal Medicine | Admitting: Internal Medicine

## 2023-02-27 VITALS — BP 148/72 | HR 85 | Temp 97.8°F | Wt 171.2 lb

## 2023-02-27 DIAGNOSIS — F17210 Nicotine dependence, cigarettes, uncomplicated: Secondary | ICD-10-CM | POA: Diagnosis not present

## 2023-02-27 DIAGNOSIS — Z170 Estrogen receptor positive status [ER+]: Secondary | ICD-10-CM | POA: Insufficient documentation

## 2023-02-27 DIAGNOSIS — Z801 Family history of malignant neoplasm of trachea, bronchus and lung: Secondary | ICD-10-CM | POA: Diagnosis not present

## 2023-02-27 DIAGNOSIS — C50912 Malignant neoplasm of unspecified site of left female breast: Secondary | ICD-10-CM | POA: Insufficient documentation

## 2023-02-27 DIAGNOSIS — E119 Type 2 diabetes mellitus without complications: Secondary | ICD-10-CM | POA: Diagnosis not present

## 2023-02-27 DIAGNOSIS — E785 Hyperlipidemia, unspecified: Secondary | ICD-10-CM | POA: Insufficient documentation

## 2023-02-27 DIAGNOSIS — C50412 Malignant neoplasm of upper-outer quadrant of left female breast: Secondary | ICD-10-CM

## 2023-02-27 DIAGNOSIS — Z79811 Long term (current) use of aromatase inhibitors: Secondary | ICD-10-CM | POA: Insufficient documentation

## 2023-02-27 DIAGNOSIS — I10 Essential (primary) hypertension: Secondary | ICD-10-CM | POA: Insufficient documentation

## 2023-02-27 DIAGNOSIS — Z79899 Other long term (current) drug therapy: Secondary | ICD-10-CM | POA: Diagnosis not present

## 2023-02-27 LAB — COMPREHENSIVE METABOLIC PANEL
ALT: 15 U/L (ref 0–44)
AST: 17 U/L (ref 15–41)
Albumin: 4.1 g/dL (ref 3.5–5.0)
Alkaline Phosphatase: 85 U/L (ref 38–126)
Anion gap: 9 (ref 5–15)
BUN: 11 mg/dL (ref 8–23)
CO2: 27 mmol/L (ref 22–32)
Calcium: 10 mg/dL (ref 8.9–10.3)
Chloride: 101 mmol/L (ref 98–111)
Creatinine, Ser: 0.7 mg/dL (ref 0.44–1.00)
GFR, Estimated: >60 mL/min (ref >60)
Glucose, Bld: 104 mg/dL — ABNORMAL HIGH (ref 70–99)
Potassium: 3.5 mmol/L (ref 3.5–5.1)
Sodium: 137 mmol/L (ref 135–145)
Total Bilirubin: 0.3 mg/dL (ref 0.3–1.2)
Total Protein: 7.2 g/dL (ref 6.5–8.1)

## 2023-02-27 LAB — CBC WITH DIFFERENTIAL/PLATELET
Abs Immature Granulocytes: 0.01 10*3/uL (ref 0.00–0.07)
Basophils Absolute: 0.1 10*3/uL (ref 0.0–0.1)
Basophils Relative: 1 %
Eosinophils Absolute: 0.3 10*3/uL (ref 0.0–0.5)
Eosinophils Relative: 4 %
HCT: 43.8 % (ref 36.0–46.0)
Hemoglobin: 14.8 g/dL (ref 12.0–15.0)
Immature Granulocytes: 0 %
Lymphocytes Relative: 34 %
Lymphs Abs: 2.3 10*3/uL (ref 0.7–4.0)
MCH: 29.2 pg (ref 26.0–34.0)
MCHC: 33.8 g/dL (ref 30.0–36.0)
MCV: 86.4 fL (ref 80.0–100.0)
Monocytes Absolute: 0.4 10*3/uL (ref 0.1–1.0)
Monocytes Relative: 6 %
Neutro Abs: 3.7 10*3/uL (ref 1.7–7.7)
Neutrophils Relative %: 55 %
Platelets: 276 10*3/uL (ref 150–400)
RBC: 5.07 MIL/uL (ref 3.87–5.11)
RDW: 13.8 % (ref 11.5–15.5)
WBC: 6.7 10*3/uL (ref 4.0–10.5)
nRBC: 0 % (ref 0.0–0.2)

## 2023-02-27 NOTE — Progress Notes (Signed)
[facility] CONSULT NOTE  Patient Care Team: Jacky Kindle, FNP as PCP - General (Family Medicine) Hulen Luster, RN as Oncology Nurse Navigator Juanell Fairly, RN as Case Manager   CANCER STAGING   Cancer Staging  Breast cancer, left [facility]) Staging form: Breast, AJCC 8th Edition - Clinical stage from 06/26/2022: Stage IA (cT1b, cN0, cM0, G2, ER+, PR-, HER2-) - Signed by Michaelyn Barter, MD on 07/01/2022 Stage prefix: Initial diagnosis Method of lymph node assessment: Clinical Nuclear grade: G2 Mitotic count score: Score 1 Histologic grading system: 3 grade system   ASSESSMENT & PLAN:  Alexandria [name] 67 y.o. 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  #Left breast IDC, ER+ PR- HER2-, Stage 1A -Detected on screening mammogram done on 05/15/2022. Biopsy specimen showed 5mm invasive mammary cancer with grade 2.   - s/p lumpectomy with SLNB by Dr. Aleen Campi on 07/17/2022. Surgical pathology showed single focus of invasive carcinoma, size 5 mm, overall grade 2, DCIS present intermediate grade, margins negative, 0/3 lymph nodes negative. Oncotype DX showed recurrence score of 18. No benefit from chemotherapy.   - Completed RT to left breast on 09/26/22.   - Started on Letrozole Feb 1st. Tolerating well. Has hot flashes 2-3 times a week but manageable.  Taking calcium vitamin D supplements.   - DEXA scan done on 09/16/22 was normal. Repeat in 2 years.   -Will schedule for annual mammogram in October 2024.  #Genetics - declined.   #HLD- on rosuvastatin   #HTN - on amlodipine, HCTZ  #DM - on metformin   Orders Placed This Encounter  Procedures   MM DIAG BREAST TOMO BILATERAL    Standing Status:   Future    Standing Expiration Date:   02/27/2024    Order Specific Question:   Reason for Exam (SYMPTOM   OR DIAGNOSIS REQUIRED)    Answer:   breast cancer    Order Specific Question:   Preferred imaging location?    Answer:   [facility]   US Breast Limited Uni Left Inc Axilla    Standing Status:   Future    Standing Expiration Date:   02/27/2024    Order Specific Question:   Reason for Exam (SYMPTOM  OR DIAGNOSIS REQUIRED)    Answer:   breast cancer    Order Specific Question:   Preferred imaging location?    Answer:   [facility]   CBC with Differential (Cancer Center Only)    Standing Status:   Future    Standing Expiration Date:   02/27/2024   CMP (Cancer Center only)    Standing Status:   Future    Standing Expiration Date:   02/27/2024   RTC in 6 months for MD visit, labs  The total time spent in the appointment was 30 minutes encounter with patients including review of chart and various tests results, discussions about plan of care and coordination of care plan   All questions were answered. The patient knows to call the clinic with any problems, questions or concerns. No barriers to learning was detected.  Michaelyn Barter, MD 6/27/202411:58 AM   HISTORY OF PRESENTING ILLNESS:  Alexandria [name] 67 y.o. female with pmh of with past medical history of anxiety, hypertension, hyperlipidemia, prediabetes, chronic smoker was referred to oncology for management of left breast IDC stage  Ia ER positive, PR and HER2 negative.  INTERVAL HISTORY-  Patient seen today as follow up for letrozole toxicity check. Accompained with daughter Patient is tolerating letrozole well.  Has hot flashes 2-3 times a week but manageable.  Reports some pain in the left knee which was present even prior to starting letrozole.  Denies any worsening.  Patient have annual physical with her primary in August and will have lipid panel checked.  Denies any other concerns.  I have reviewed her chart and materials related to her cancer extensively and collaborated history with the patient. Summary of oncologic  history is as follows: Oncology History  Breast cancer, left (HCC)  05/15/2022 Mammogram   Screening mammogram-  FINDINGS: In the left breast, a possible asymmetry and distortion with calcifications warrant further evaluation. In the right breast, no findings suspicious for malignancy.   IMPRESSION: Further evaluation is suggested for possible asymmetry and distortion with calcifications in the left breast.     06/06/2022 Mammogram   Diagnostic mammogram and US IMPRESSION: 1. Left breast architectural distortion in the upper-outer quadrant posterior depth, without a sonographic correlate, is intermediate suspicion for malignancy. 2. Previously described asymmetry in the inner left breast does not persist with additional views, consistent with superimposed fibroglandular tissue. 3. Incidentally visualized 3 mm benign simple cyst in the left breast 6 o'clock position.   RECOMMENDATION: Left breast stereotactic guided biopsy of the architectural distortion in the upper-outer quadrant (1 site).  [place] left axilla normal    06/26/2022 Pathology Results   DIAGNOSIS:  A. BREAST, LEFT UPPER OUTER QUADRANT, POSTERIOR DEPTH; STEREOTACTIC CORE  NEEDLE BIOPSY:  - INVASIVE MAMMARY CARCINOMA, NO SPECIAL TYPE.  - CALCIFICATIONS ASSOCIATED WITH BENIGN AND NEOPLASTIC MAMMARY ELEMENTS.   Size of invasive carcinoma: 5 mm in this sample  Histologic grade of invasive carcinoma: Grade 2                       Glandular/tubular differentiation score: 3                       Nuclear pleomorphism score: 2                       Mitotic rate score: 1                       Total score: 6  Ductal carcinoma in situ: Present, grade 2-3, with focal comedonecrosis  Lymphovascular invasion: Not identified   ADDENDUM:  CASE SUMMARY: BREAST BIOMARKER TESTS  Estrogen Receptor (ER) Status: POSITIVE          Percentage of cells with nuclear positivity: Greater than 90%          Average intensity of staining:  Strong   Progesterone Receptor (PgR) Status: NEGATIVE (LESS THAN 1%)          Internal control cells present and stain as expected   HER2 (by immunohistochemistry): NEGATIVE (Score 1+)  Ki-67: Not performed     07/17/2022 Definitive Surgery   Lumpectomy with SLNB with Dr. Aleen Campi   TUMOR Histologic Type: Invasive carcinoma of no special type Histologic Grade ([place] Histologic Score)      Glandular (Acinar)/Tubular Differentiation: 3      Nuclear Pleomorphism: 2      Mitotic Rate: 1      Overall Grade: Grade 2 Tumor Size: 5 mm Tumor Focality: Single focus of invasive carcinoma Ductal Carcinoma In  Situ (DCIS): Present, intermediate grade Tumor Extent: Not applicable Lymphatic and/or Vascular Invasion: Not identified Treatment Effect in the Breast: No known presurgical therapy  MARGINS Margin Status for Invasive Carcinoma: All margins negative for invasive carcinoma      Distance from closest margin: At least 5 mm      Specify closest margin: All surgical margins  Margin Status for DCIS: All margins negative for DCIS      Distance from DCIS to closest margin: At least 5 mm      Specify closest margin: All surgical margins  REGIONAL LYMPH NODES Regional Lymph Node Status: All regional lymph nodes negative for tumor      Total Number of Lymph Nodes Examined (sentinel and non-sentinel): 3       Number of Sentinel Nodes Examined: 3  DISTANT METASTASIS Distant Site(s) Involved, if applicable: Not applicable  PATHOLOGIC STAGE CLASSIFICATION (pTNM, AJCC 8th Edition): Modified Classification: Not applicable pT Category: pT1a T Suffix: Not applicable pN Category: pN0 N Suffix: sn pM Category: Not applicable  SPECIAL STUDIES Breast Biomarker Testing Performed on Previous Biopsy: [id number] Estrogen Receptor (ER) Status: POSITIVE         Percentage of cells with nuclear positivity: Greater than 90%         Average intensity of staining: Strong  Progesterone Receptor  (PgR) Status: NEGATIVE (LESS THAN 1%)         Internal control cells present and stain as expected  HER2 (by immunohistochemistry): NEGATIVE (Score 1+) Ki-67: Not performed     Menarche age 49 Children 3 Age at first birth 16 Birth control OCP less than 5 years Menopause age 60 HRT no History of breast biopsies no Family history not significant  MEDICAL HISTORY:  Past Medical History:  Diagnosis Date   Anxiety    Cancer (HCC)    Carbuncle and furuncle of trunk    COPD (chronic obstructive pulmonary disease) (HCC)    Depression    Diabetes mellitus without complication (HCC)    Dyspnea    GERD (gastroesophageal reflux disease)    HTN (hypertension)    Hyperlipidemia    Intrinsic asthma, unspecified    Methicillin resistant Staphylococcus aureus in conditions classified elsewhere and of unspecified site    Other psychological or physical stress, not elsewhere classified(V62.89)    Pneumonia    Pre-diabetes    Tobacco abuse     SURGICAL HISTORY: Past Surgical History:  Procedure Laterality Date   BREAST BIOPSY Left 06/26/2022   stereo biopsy/ coil clip/ path pending   BREAST LUMPECTOMY,RADIO FREQ LOCALIZER,AXILLARY SENTINEL LYMPH NODE BIOPSY Left 07/17/2022   Procedure: BREAST LUMPECTOMY,RADIO FREQ LOCALIZER,AXILLARY SENTINEL LYMPH NODE BIOPSY;  Surgeon: Henrene Dodge, MD;  Location: [facility];  Service: General;  Laterality: Left;   COLONOSCOPY WITH PROPOFOL N/A 04/10/2015   Procedure: COLONOSCOPY WITH PROPOFOL;  Surgeon: Midge Minium, MD;  Location: [facility];  Service: Endoscopy;  Laterality: N/A;   COLONOSCOPY WITH PROPOFOL N/A 06/25/2021   Procedure: COLONOSCOPY WITH PROPOFOL;  Surgeon: Midge Minium, MD;  Location: [facility];  Service: Endoscopy;  Laterality: N/A;  leave at 8 arrival   ESOPHAGOGASTRODUODENOSCOPY (EGD) WITH PROPOFOL N/A 12/06/2021   Procedure: ESOPHAGOGASTRODUODENOSCOPY (EGD) WITH PROPOFOL;  Surgeon: Midge Minium, MD;  Location:  [facility];  Service: Endoscopy;  Laterality: N/A;   POLYPECTOMY  04/10/2015   Procedure: POLYPECTOMY;  Surgeon: Midge Minium, MD;  Location: [facility];  Service: Endoscopy;;   POLYPECTOMY N/A 06/25/2021   Procedure: POLYPECTOMY;  Surgeon:  Midge Minium, MD;  Location: [facility];  Service: Endoscopy;  Laterality: N/A;   STATUS POST ENDOMETRIAL ABLATION     TONSILLECTOMY     TUBAL LIGATION     bilateral, sterile ablation   VARICOSE VEINS      SOCIAL HISTORY: Social History   Socioeconomic History   Marital status: Widowed    Spouse name: Not on file   Number of children: 3   Years of education: 74   Highest education level: Not on file  Occupational History   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  Tobacco Use   Smoking status: Every Day    Packs/day: 0.50    Years: 30.00    Additional pack years: 0.00    Total pack years: 15.00    Types: Cigarettes    Last attempt to quit: 01/31/2017    Years since quitting: 6.0    Passive exposure: Past   Smokeless tobacco: Never   Tobacco comments:    Currently smoking 10 cigarettes a day  Vaping Use   Vaping Use: Never used  Substance and Sexual Activity   Alcohol use: No    Alcohol/week: 0.0 standard drinks of alcohol   Drug use: No   Sexual activity: Never  Other Topics Concern   Not on file  Social History Narrative   Lives alone - daughter stays with her most of the time   Social Determinants of Health   Financial Resource Strain: Low Risk  (08/29/2022)   Overall Financial Resource Strain (CARDIA)    Difficulty of Paying Living Expenses: Not very hard  Food Insecurity: No Food Insecurity (08/29/2022)   Hunger Vital Sign    Worried About Running Out of Food in the Last Year: Never true    Ran Out of Food in the Last Year: Never true  Transportation Needs: No Transportation Needs (08/29/2022)   PRAPARE - Administrator, Civil Service (Medical): No    Lack of  Transportation (Non-Medical): No  Physical Activity: Inactive (08/29/2022)   Exercise Vital Sign    Days of Exercise per Week: 0 days    Minutes of Exercise per Session: 0 min  Stress: Stress Concern Present (08/29/2022)   [employer] of Occupational Health - Occupational Stress Questionnaire    Feeling of Stress : To some extent  Social Connections: Socially Isolated (08/29/2022)   Social Connection and Isolation Panel [NHANES]    Frequency of Communication with Friends and Family: More than three times a week    Frequency of Social Gatherings with Friends and Family: More than three times a week    Attends Religious Services: Never    Database administrator or Organizations: No    Attends Banker Meetings: Never    Marital Status: Widowed  Intimate Partner Violence: Not At Risk (07/29/2022)   Humiliation, Afraid, Rape, and Kick questionnaire    Fear of Current or Ex-Partner: No    Emotionally Abused: No    Physically Abused: No    Sexually Abused: No    FAMILY HISTORY: Family History  Problem Relation Age of Onset   Coronary artery disease Mother    Hypertension Mother    Rheum arthritis Mother    Heart attack Mother    Lung cancer Father    Emphysema Father    Gout Father    Diabetes Sister    Hypertension Daughter    Hypertension Daughter    Obesity Daughter  Seizures Daughter    Breast cancer Neg Hx    Bladder Cancer Neg Hx    Kidney cancer Neg Hx     ALLERGIES:  is allergic to pravastatin sodium.  MEDICATIONS:  Current Outpatient Medications  Medication Sig Dispense Refill   acetaminophen (TYLENOL) 500 MG tablet Take 1,000 mg by mouth every 6 (six) hours as needed for mild pain.     albuterol (VENTOLIN HFA) 108 (90 Base) MCG/ACT inhaler Inhale 1-2 puffs into the lungs every 6 (six) hours as needed for wheezing or shortness of breath. 18 g 11   amLODipine (NORVASC) 10 MG tablet TAKE 1 TABLET BY MOUTH DAILY 100 tablet 0   cetirizine  (ZYRTEC) 10 MG tablet Take 1 tablet (10 mg total) by mouth daily. 30 tablet 11   clonazePAM (KLONOPIN) 0.5 MG tablet TAKE ONE-HALF TABLET BY MOUTH 3  TIMES DAILY AS NEEDED FOR  ANXIETY 90 tablet 0   famotidine (PEPCID) 20 MG tablet Take 20 mg by mouth as needed for heartburn.     hydrochlorothiazide (HYDRODIURIL) 25 MG tablet TAKE 1 TABLET BY MOUTH DAILY 100 tablet 0   letrozole (FEMARA) 2.5 MG tablet TAKE 1 TABLET BY MOUTH DAILY  (START 1 WEEK AFTER COMPLETION  OF RADIATION) 90 tablet 3   loperamide (IMODIUM) 2 MG capsule TAKE 1 CAPSULE BY MOUTH 4 TIMES  DAILY AS NEEDED FOR DIARRHEA OR  LOOSE STOOLS 60 capsule 0   metFORMIN (GLUCOPHAGE-XR) 750 MG 24 hr tablet TAKE 1 TABLET BY MOUTH DAILY  WITH BREAKFAST 100 tablet 3   ondansetron (ZOFRAN-ODT) 4 MG disintegrating tablet Take 1 tablet (4 mg total) by mouth every 8 (eight) hours as needed for nausea or vomiting. 20 tablet 0   rosuvastatin (CRESTOR) 40 MG tablet Take 1 tablet (40 mg total) by mouth daily. 90 tablet 3   Spacer/Aero-Holding Chambers (AEROCHAMBER MV) inhaler Use as instructed 1 each 1   TRELEGY ELLIPTA 100-62.5-25 MCG/ACT AEPB USE 1 INHALATION BY MOUTH DAILY 180 each 3   No current facility-administered medications for this visit.    REVIEW OF SYSTEMS:   Pertinent information mentioned in HPI All other systems were reviewed with the patient and are negative.  PHYSICAL EXAMINATION: ECOG PERFORMANCE STATUS: 1 - Symptomatic but completely ambulatory  Vitals:   02/27/23 1027  BP: (!) 148/72  Pulse: 85  Temp: 97.8 F (36.6 C)  SpO2: 98%     Filed Weights   02/27/23 1027  Weight: 171 lb 3.2 oz (77.7 kg)      GENERAL:alert, no distress and comfortable SKIN: skin color, texture, turgor are normal, no rashes or significant lesions EYES: normal, conjunctiva are pink and non-injected, sclera clear OROPHARYNX:no exudate, no erythema and lips, buccal mucosa, and tongue normal  NECK: supple, thyroid normal size, non-tender,  without nodularity LYMPH:  no palpable lymphadenopathy in the cervical, axillary or inguinal LUNGS: clear to auscultation and percussion with normal breathing effort HEART: regular rate & rhythm and no murmurs and no lower extremity edema ABDOMEN:abdomen soft, non-tender and normal bowel sounds Musculoskeletal:no cyanosis of digits and no clubbing  PSYCH: alert & oriented x 3 with fluent speech NEURO: no focal motor/sensory deficits  LABORATORY DATA:  I have reviewed the data as listed Lab Results  Component Value Date   WBC 6.7 02/27/2023   HGB 14.8 02/27/2023   HCT 43.8 02/27/2023   MCV 86.4 02/27/2023   PLT 276 02/27/2023   Recent Labs    04/11/22 1048 07/28/22 1140 07/29/22 0435 07/31/22 0549  08/01/22 0538 02/27/23 1011  NA 140 137   < > 137 139 137  K 4.1 3.1*   < > 3.3* 3.7 3.5  CL 97 98   < > 104 108 101  CO2 27 28   < > 26 24 27   GLUCOSE 103* 111*   < > 92 98 104*  BUN 6* 14   < > 16 16 11   CREATININE 0.69 0.71   < > 0.54 0.55 0.70  CALCIUM 10.3 10.0   < > 9.5 9.7 10.0  GFRNONAA  --  >60   < > >60 >60 >60  PROT 6.8 7.3  --   --   --  7.2  ALBUMIN 4.6 3.9  --   --   --  4.1  AST 17 21  --   --   --  17  ALT 21 21  --   --   --  15  ALKPHOS 135* 96  --   --   --  85  BILITOT 0.5 0.9  --   --   --  0.3   < > = values in this interval not displayed.    RADIOGRAPHIC STUDIES: I have personally reviewed the radiological images as listed and agreed with the findings in the report. No results found.

## 2023-03-12 ENCOUNTER — Ambulatory Visit (INDEPENDENT_AMBULATORY_CARE_PROVIDER_SITE_OTHER): Payer: 59

## 2023-03-12 VITALS — Ht 66.0 in | Wt 171.0 lb

## 2023-03-12 DIAGNOSIS — Z0000 Encounter for general adult medical examination without abnormal findings: Secondary | ICD-10-CM

## 2023-03-12 NOTE — Progress Notes (Signed)
Subjective:   Alexandria [name] is a 67 y.o. female who presents for Medicare Annual (Subsequent) preventive examination.  Visit Complete: Virtual  I connected with  Arfa Lamarca Schellinger on 03/12/23 by a audio enabled telemedicine application and verified that I am speaking with the correct person using two identifiers.  Patient Location: Home  Provider Location: Office/Clinic  I discussed the limitations of evaluation and management by telemedicine. The patient expressed understanding and agreed to proceed.  Patient Medicare AWV questionnaire was completed by the patient on (not done); I have confirmed that all information answered by patient is correct and no changes since this date.  Review of Systems    Cardiac Risk Factors include: advanced age (>96men, >4 women);diabetes mellitus;dyslipidemia;hypertension;sedentary lifestyle;smoking/ tobacco exposure    Objective:    Today's Vitals   03/12/23 0925  Weight: 171 lb (77.6 kg)  Height: 5\' 6"  (1.676 m)   Body mass index is 27.6 kg/m.     03/12/2023    9:37 AM 02/27/2023   10:21 AM 10/29/2022   10:07 AM 10/24/2022   10:08 AM 08/07/2022   10:30 AM 07/28/2022    8:55 PM 07/28/2022   11:39 AM  Advanced Directives  Does Patient Have a Medical Advance Directive? No No No No No No No  Would patient like information on creating a medical advance directive?    No - Patient declined No - Patient declined No - Patient declined     Current Medications (verified) Outpatient Encounter Medications as of 03/12/2023  Medication Sig   acetaminophen (TYLENOL) 500 MG tablet Take 1,000 mg by mouth every 6 (six) hours as needed for mild pain.   albuterol (VENTOLIN HFA) 108 (90 Base) MCG/ACT inhaler Inhale 1-2 puffs into the lungs every 6 (six) hours as needed for wheezing or shortness of breath.   amLODipine (NORVASC) 10 MG tablet TAKE 1 TABLET BY MOUTH DAILY   cetirizine (ZYRTEC) 10 MG tablet Take 1 tablet (10 mg total) by mouth daily.   clonazePAM  (KLONOPIN) 0.5 MG tablet TAKE ONE-HALF TABLET BY MOUTH 3  TIMES DAILY AS NEEDED FOR  ANXIETY   famotidine (PEPCID) 20 MG tablet Take 20 mg by mouth as needed for heartburn.   hydrochlorothiazide (HYDRODIURIL) 25 MG tablet TAKE 1 TABLET BY MOUTH DAILY   letrozole (FEMARA) 2.5 MG tablet TAKE 1 TABLET BY MOUTH DAILY  (START 1 WEEK AFTER COMPLETION  OF RADIATION)   loperamide (IMODIUM) 2 MG capsule TAKE 1 CAPSULE BY MOUTH 4 TIMES  DAILY AS NEEDED FOR DIARRHEA OR  LOOSE STOOLS   metFORMIN (GLUCOPHAGE-XR) 750 MG 24 hr tablet TAKE 1 TABLET BY MOUTH DAILY  WITH BREAKFAST   rosuvastatin (CRESTOR) 40 MG tablet Take 1 tablet (40 mg total) by mouth daily.   Spacer/Aero-Holding Chambers (AEROCHAMBER MV) inhaler Use as instructed   TRELEGY ELLIPTA 100-62.5-25 MCG/ACT AEPB USE 1 INHALATION BY MOUTH DAILY   ondansetron (ZOFRAN-ODT) 4 MG disintegrating tablet Take 1 tablet (4 mg total) by mouth every 8 (eight) hours as needed for nausea or vomiting. (Patient not taking: Reported on 03/12/2023)   [DISCONTINUED] fluticasone (FLONASE) 50 MCG/ACT nasal spray Place 2 sprays into both nostrils daily.   No facility-administered encounter medications on file as of 03/12/2023.    Allergies (verified) Pravastatin sodium   History: Past Medical History:  Diagnosis Date   Anxiety    Cancer (HCC)    Carbuncle and furuncle of trunk    COPD (chronic obstructive pulmonary disease) (HCC)    Depression  Diabetes mellitus without complication (HCC)    Dyspnea    GERD (gastroesophageal reflux disease)    HTN (hypertension)    Hyperlipidemia    Intrinsic asthma, unspecified    Methicillin resistant Staphylococcus aureus in conditions classified elsewhere and of unspecified site    Other psychological or physical stress, not elsewhere classified(V62.89)    Pneumonia    Pre-diabetes    Tobacco abuse    Past Surgical History:  Procedure Laterality Date   BREAST BIOPSY Left 06/26/2022   stereo biopsy/ coil clip/ path  pending   BREAST LUMPECTOMY,RADIO FREQ LOCALIZER,AXILLARY SENTINEL LYMPH NODE BIOPSY Left 07/17/2022   Procedure: BREAST LUMPECTOMY,RADIO FREQ LOCALIZER,AXILLARY SENTINEL LYMPH NODE BIOPSY;  Surgeon: Henrene Dodge, MD;  Location: [facility];  Service: General;  Laterality: Left;   COLONOSCOPY WITH PROPOFOL N/A 04/10/2015   Procedure: COLONOSCOPY WITH PROPOFOL;  Surgeon: Midge Minium, MD;  Location: [facility];  Service: Endoscopy;  Laterality: N/A;   COLONOSCOPY WITH PROPOFOL N/A 06/25/2021   Procedure: COLONOSCOPY WITH PROPOFOL;  Surgeon: Midge Minium, MD;  Location: [facility];  Service: Endoscopy;  Laterality: N/A;  leave at 8 arrival   ESOPHAGOGASTRODUODENOSCOPY (EGD) WITH PROPOFOL N/A 12/06/2021   Procedure: ESOPHAGOGASTRODUODENOSCOPY (EGD) WITH PROPOFOL;  Surgeon: Midge Minium, MD;  Location: [facility];  Service: Endoscopy;  Laterality: N/A;   POLYPECTOMY  04/10/2015   Procedure: POLYPECTOMY;  Surgeon: Midge Minium, MD;  Location: [facility];  Service: Endoscopy;;   POLYPECTOMY N/A 06/25/2021   Procedure: POLYPECTOMY;  Surgeon: Midge Minium, MD;  Location: [facility];  Service: Endoscopy;  Laterality: N/A;   STATUS POST ENDOMETRIAL ABLATION     TONSILLECTOMY     TUBAL LIGATION     bilateral, sterile ablation   VARICOSE VEINS     Family History  Problem Relation Age of Onset   Coronary artery disease Mother    Hypertension Mother    Rheum arthritis Mother    Heart attack Mother    Lung cancer Father    Emphysema Father    Gout Father    Diabetes Sister    Hypertension Daughter    Hypertension Daughter    Obesity Daughter    Seizures Daughter    Breast cancer Neg Hx    Bladder Cancer Neg Hx    Kidney cancer Neg Hx    Social History   Socioeconomic History   Marital status: Widowed    Spouse name: Not on file   Number of children: 3   Years of education: 8   Highest education level: Not on file  Occupational History    Occupation: [employer]    Comment: CNA  Tobacco Use   Smoking status: Every Day    Packs/day: 0.50    Years: 30.00    Additional pack years: 0.00    Total pack years: 15.00    Types: Cigarettes    Last attempt to quit: 01/31/2017    Years since quitting: 6.1    Passive exposure: Past   Smokeless tobacco: Never   Tobacco comments:    Currently smoking 10 cigarettes a day  Vaping Use   Vaping Use: Never used  Substance and Sexual Activity   Alcohol use: No    Alcohol/week: 0.0 standard drinks of alcohol   Drug use: No   Sexual activity: Never  Other Topics Concern   Not on file  Social History Narrative   Lives alone - daughter stays with her most of the time  Social Determinants of Health   Financial Resource Strain: Low Risk  (03/12/2023)   Overall Financial Resource Strain (CARDIA)    Difficulty of Paying Living Expenses: Not very hard  Food Insecurity: No Food Insecurity (03/12/2023)   Hunger Vital Sign    Worried About Running Out of Food in the Last Year: Never true    Ran Out of Food in the Last Year: Never true  Transportation Needs: No Transportation Needs (03/12/2023)   PRAPARE - Administrator, Civil Service (Medical): No    Lack of Transportation (Non-Medical): No  Physical Activity: Inactive (03/12/2023)   Exercise Vital Sign    Days of Exercise per Week: 0 days    Minutes of Exercise per Session: 0 min  Stress: Stress Concern Present (03/12/2023)   [employer] of Occupational Health - Occupational Stress Questionnaire    Feeling of Stress : To some extent  Social Connections: Socially Isolated (03/12/2023)   Social Connection and Isolation Panel [NHANES]    Frequency of Communication with Friends and Family: More than three times a week    Frequency of Social Gatherings with Friends and Family: More than three times a week    Attends Religious Services: Never    Database administrator or Organizations: No    Attends Tax inspector Meetings: Never    Marital Status: Widowed    Tobacco Counseling Ready to quit: Not Answered Counseling given: Not Answered Tobacco comments: Currently smoking 10 cigarettes a day   Clinical Intake:  Pre-visit preparation completed: Yes  Pain : No/denies pain     BMI - recorded: 27.6 Nutritional Status: BMI 25 -29 Overweight Nutritional Risks: None Diabetes: Yes CBG done?: No Did pt. bring in CBG monitor from home?: No  How often do you need to have someone help you when you read instructions, pamphlets, or other written materials from your doctor or pharmacy?: 1 - Never  Interpreter Needed?: No  Comments: lives alone Information entered by :: B.Treydon Henricks,LPN   Activities of Daily Living    03/12/2023    9:37 AM 08/12/2022   10:51 AM  In your present state of health, do you have any difficulty performing the following activities:  Hearing? 0 0  Vision? 0 0  Difficulty concentrating or making decisions? 0 0  Walking or climbing stairs? 1 0  Dressing or bathing? 0 0  Doing errands, shopping? 0 0  Preparing Food and eating ? N   Using the Toilet? N   In the past six months, have you accidently leaked urine? Y   Do you have problems with loss of bowel control? N   Managing your Medications? N   Managing your Finances? N   Housekeeping or managing your Housekeeping? N     Patient Care Team: Jacky Kindle, FNP as PCP - General (Family Medicine) Hulen Luster, RN as Oncology Nurse Navigator Juanell Fairly, RN as Case Manager  Indicate any recent Medical Services you may have received from other than [facility] providers in the past year (date may be approximate).     Assessment:   This is a routine wellness examination for [facility].  Hearing/Vision screen Hearing Screening - Comments:: Adequate hearing Vision Screening - Comments:: Adequate vision; readers only [facility]  Dietary issues and exercise activities discussed:     Goals Addressed              This Visit's Progress    DIET - EAT MORE FRUITS AND VEGETABLES  On track    management of COPD   On track    Care Coordination Interventions: Provided patient with basic written and verbal COPD education on self care/management/and exacerbation prevention Advised patient to track and manage COPD triggers Provided instruction about proper use of medications used for management of COPD including inhalers Advised patient to self assesses COPD action plan zone and make appointment with provider if in the yellow zone for 48 hours without improvement        Depression Screen    03/12/2023    9:33 AM 08/29/2022    9:30 AM 08/12/2022   10:51 AM 04/08/2022    1:42 PM 04/05/2021    9:08 AM 04/03/2020   10:08 AM 04/02/2019    9:09 AM  PHQ 2/9 Scores  PHQ - 2 Score 0 1 2 0 2 0 2  PHQ- 9 Score   7 0 2  4    Fall Risk    03/12/2023    9:28 AM 09/27/2022    9:18 AM 08/29/2022   11:38 PM 08/12/2022   10:51 AM 04/08/2022    1:44 PM  Fall Risk   Falls in the past year? 0 0 0 0 0  Number falls in past yr: 0 0 0 0 0  Injury with Fall? 0 0 0 0 0  Risk for fall due to : No Fall Risks Medication side effect Medication side effect  No Fall Risks  Follow up Falls prevention discussed;Education provided Falls prevention discussed Falls prevention discussed  Falls evaluation completed    MEDICARE RISK AT HOME:  Medicare Risk at Home - 03/12/23 0929     Any stairs in or around the home? Yes    If so, are there any without handrails? Yes    Home free of loose throw rugs in walkways, pet beds, electrical cords, etc? Yes    Adequate lighting in your home to reduce risk of falls? Yes    Life alert? No    Use of a cane, walker or w/c? No    Grab bars in the bathroom? No    Shower chair or bench in shower? Yes    Elevated toilet seat or a handicapped toilet? No             TIMED UP AND GO:  Was the test performed?  No    Cognitive Function:        03/12/2023    9:38 AM 04/08/2022     1:46 PM 04/05/2021    9:08 AM 04/03/2020   10:17 AM  6CIT Screen  What Year? 0 points 0 points 0 points 0 points  What month? 0 points 0 points 0 points 0 points  What time? 0 points 0 points 0 points 0 points  Count back from 20 0 points 0 points 0 points 0 points  Months in reverse 0 points 0 points 0 points 0 points  Repeat phrase 0 points 0 points 0 points 0 points  Total Score 0 points 0 points 0 points 0 points    Immunizations Immunization History  Administered Date(s) Administered   Fluad Quad(high Dose 65+) 07/17/2021   Influenza Inj Mdck Quad Pf 06/02/2022   Influenza,inj,Quad PF,6+ Mos 05/19/2018   Influenza-Unspecified 06/14/2019   PNEUMOCOCCAL CONJUGATE-20 04/11/2022   Pneumococcal Polysaccharide-23 03/10/2015, 04/03/2020   Respiratory Syncytial Virus Vaccine,Recomb Aduvanted(Arexvy) 06/02/2022   Tdap 03/17/2017   Zoster Recombinant(Shingrix) 06/12/2022   Zoster, Live 03/10/2015    TDAP status: Up to date  Flu Vaccine status: Up to date  Pneumococcal vaccine status: Up to date  Covid-19 vaccine status: Declined, Education has been provided regarding the importance of this vaccine but patient still declined. Advised may receive this vaccine at local pharmacy or Health Dept.or vaccine clinic. Aware to provide a copy of the vaccination record if obtained from local pharmacy or Health Dept. Verbalized acceptance and understanding.  Qualifies for Shingles Vaccine? Yes   Zostavax completed Yes  #1 Shingrix Completed?: No.    Education has been provided regarding the importance of this vaccine. Patient has been advised to call insurance company to determine out of pocket expense if they have not yet received this vaccine. Advised may also receive vaccine at local pharmacy or Health Dept. Verbalized acceptance and understanding.  Screening Tests Health Maintenance  Topic Date Due   COVID-19 Vaccine (1) Never done   Zoster Vaccines- Shingrix (2 of 2) 08/07/2022    HEMOGLOBIN A1C  10/12/2022   INFLUENZA VACCINE  04/03/2023   OPHTHALMOLOGY EXAM  04/27/2023   Diabetic kidney evaluation - Urine ACR  08/13/2023   FOOT EXAM  08/13/2023   Diabetic kidney evaluation - eGFR measurement  02/27/2024   Medicare Annual Wellness (AWV)  03/11/2024   MAMMOGRAM  05/15/2024   Colonoscopy  06/25/2026   DTaP/Tdap/Td (2 - Td or Tdap) 03/18/2027   Pneumonia Vaccine 50+ Years old  Completed   DEXA SCAN  Completed   Hepatitis C Screening  Completed   HPV VACCINES  Aged Out    Health Maintenance  Health Maintenance Due  Topic Date Due   COVID-19 Vaccine (1) Never done   Zoster Vaccines- Shingrix (2 of 2) 08/07/2022   HEMOGLOBIN A1C  10/12/2022    Colorectal cancer screening: Type of screening: Colonoscopy. Completed yes. Repeat every 5-10 years  Mammogram status: Completed yes. Repeat every year  Bone Density status: Completed yes. Results reflect: Bone density results: NORMAL. Repeat every 5 years.  Lung Cancer Screening: (Low Dose CT Chest recommended if Age 12-80 years, 20 pack-year currently smoking OR have quit w/in 15years.) does qualify.   Lung Cancer Screening Referral: no pt will talk with MD  Additional Screening:  Hepatitis C Screening: does not qualify; Completed yes  Vision Screening: Recommended annual ophthalmology exams for early detection of glaucoma and other disorders of the eye. Is the patient up to date with their annual eye exam?  Yes  Who is the provider or what is the name of the office in which the patient attends annual eye exams? [facility] If pt is not established with a provider, would they like to be referred to a provider to establish care? No .   Dental Screening: Recommended annual dental exams for proper oral hygiene  Diabetic Foot Exam: Diabetic Foot Exam: Completed yes  Community Resource Referral / Chronic Care Management: CRR required this visit?  No   CCM required this visit?  No    Plan:     I have  personally reviewed and noted the following in the patient's chart:   Medical and social history Use of alcohol, tobacco or illicit drugs  Current medications and supplements including opioid prescriptions. Patient is not currently taking opioid prescriptions. Functional ability and status Nutritional status Physical activity Advanced directives List of other physicians Hospitalizations, surgeries, and ER visits in previous 12 months Vitals Screenings to include cognitive, depression, and falls Referrals and appointments  In addition, I have reviewed and discussed with patient certain preventive protocols, quality metrics, and best  practice recommendations. A written personalized care plan for preventive services as well as general preventive health recommendations were provided to patient.     Sue Lush, LPN   [id number]   After Visit Summary: (MyChart) Due to this being a telephonic visit, the after visit summary with patients personalized plan was offered to patient via MyChart   Nurse Notes: The patient states she is doing well and has no concerns or questions at this time.

## 2023-03-12 NOTE — Patient Instructions (Signed)
Alexandria [name] , Thank you for taking time to come for your Medicare Wellness Visit. I appreciate your ongoing commitment to your health goals. Please review the following plan we discussed and let me know if I can assist you in the future.   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 Reports completing radiation therapy as planned. Reports tolerating well. Completed last treatment on 09/27/22. Will start Femara on 10/03/22. Discussed ability to complete ADLs and care coordination needs. Remains independent with ADLs and IADLs. Denies changes or decline in functional status. Discussed symptoms since treatment. Reports some fatigue but overall doing well. Notes radiation rash to left breast and armpit. Reports using Aquaphor as advised by the Radiation Oncology team. Wearing loose shirts to prevent further irritation. Aware of need to follow up if rash worsens or drainage is noted. Reviewed pending appointments. Will follow up with the Radiation Oncology team on 10/24/22. Will follow up with Oncologist on 10/29/22.   Symptom Management: Attend all provider appointments Take medications as prescribed Update the care management team to address care coordination concerns as needed Call provider office for new concerns or questions   Follow Up Plan:  Will follow up next month       Goal: CCM (COPD) Expected Outcome:  Monitor, Self-Manage And Reduce Symptoms of Chronic Obstructive Pulmonary Disease     Current Barriers:  Chronic Disease Management support and education needs related to COPD  Planned Interventions: Reviewed provider's plan for COPD  management. Reports taking medications as prescribed. Confirmed order for new albuterol inhalers. Reviewed symptoms. Reports symptoms have been well controlled. Experiencing fatigue but otherwise doing well.  Attempting to decrease use but reports currently smoking daily. Discussed various options and resources for smoking cessation. Declined offer for counseling. Agreed to update the care management team if additional resources are needed. Reviewed importance of monitoring symptoms daily. Advised to contact a provider and make an appointment if experiencing moderate symptoms for greater than 48 hours without improvement.  Provided information regarding infection prevention and increased risk r/t COPD. Advised to utilize prevention strategies to reduce risk of respiratory infection. Reviewed worsening symptoms that require immediate medical attention.   Symptom Management: Call pharmacy for medication refills 3-7 days in advance of running out of medications Identify and remove indoor air pollutants Do breathing exercises every day Eliminate symptom triggers at home Practice relaxation or meditation daily Consider plan for smoking cessation Follow rescue plan if symptoms flare-up Call provider office for new concerns or questions   Follow Up Plan:   Will follow up next month       Goal: CCM (Diabetes) Expected Outcome:  Monitor, Self-Manage And Reduce Symptoms of Diabetes     Current Barriers:  Chronic Disease Management support and education needs related to Diabetes  Planned Interventions: Reviewed provider's plan for Diabetes Management. Reviewed medications and discussed importance of medication adherence.  Reports currently taking medications as prescribed. Denies current concerns related to medication management or prescription cost. Agreed to update the care management team with changes. Reports currently not being required to monitor fasting blood glucose. A1C is currently within  goal.  Reviewed s/sx of hypoglycemia and hyperglycemia along with recommended interventions. Reports episodes of fatigue that she attributes to radiation treatment. Denies hypoglycemic or hyperglycemic episodes. Discussed nutritional intake and importance of complying with a diabetic diet. Reports doing well with diet adherence. Notes decreased appetite since starting radiation within the last week. Thorough discussion regarding meal and snack options to increase intake of protein and maintain optimal glycemic levels.  Discussed importance of completing recommended DM preventive care. Advised to complete foot care and annual eye exams as recommended. Discussed importance of completing ordered labs as prescribed.  Assessed social determinant of health barriers 09/27/22: No Changes   Lab Results  Component Value Date   HGBA1C 6.1 (H) 04/11/2022    Symptom Management: Take medications as prescribed   Attend all scheduled provider appointments Call pharmacy for medication refills 3-7 days in advance of running out of medications Keep appointment with eye doctor Check feet daily for cuts, sores or redness Wash and dry feet carefully every day Wear comfortable, cotton socks Wear comfortable, well-fitting shoes Continue reading nutrition labels and adhering to recommended diabetic diet. Call provider office for new concerns or questions   Follow Up Plan:  Will follow up next month              management of COPD     Care Coordination Interventions: Provided patient with basic written and verbal COPD education on self care/management/and exacerbation prevention Advised patient to track and manage COPD triggers Provided instruction about proper use of medications used for management of COPD including inhalers Advised patient to self assesses COPD action plan zone and make appointment with provider if in the yellow zone for 48 hours without improvement      New diagnosis of breast  cancer     Care Coordination Interventions: Assessed patient understanding of cancer diagnosis and recommended treatment plan Reviewed upcoming provider appointments and treatment appointments Assessed available transportation to appointments and treatments. Has consistent/reliable transportation: Yes Assessed support system. Has consistent/reliable family or other support: Yes Reviewed appointment dates: hospital follow up on 12/11, genetic counseling on 12/14, oncology for radiation mapping on 12/20 Discussed post op recovery, state she has done well with surgery, recovering now from COPD exacerbation.   Discussed post op plan - will have better idea of plan after follow up appointments.  Currently, planning radiation therapy Advised of contact person for oncology/nurse navigator          This is a list of the screening recommended for you and due dates:  Health Maintenance  Topic Date Due   COVID-19 Vaccine (1) Never done   Zoster (Shingles) Vaccine (2 of 2) 08/07/2022   Hemoglobin A1C  10/12/2022   Flu Shot  04/03/2023   Eye exam for diabetics  04/27/2023   Yearly kidney health urinalysis for diabetes  08/13/2023   Complete foot exam   08/13/2023   Yearly kidney function blood test for diabetes  02/27/2024   Medicare Annual Wellness Visit  03/11/2024   Mammogram  05/15/2024   Colon Cancer Screening  06/25/2026   DTaP/Tdap/Td vaccine (2 - Td or Tdap) 03/18/2027   Pneumonia Vaccine  Completed   DEXA scan (bone density measurement)  Completed   Hepatitis C Screening  Completed   HPV Vaccine  Aged Out    Advanced directives: no  Conditions/risks identified: low falls risk  Next appointment: Follow up in one year for your annual wellness visit 03/15/2024 @ 9:45am telephone   Preventive Care 65 Years and Older,  Female Preventive care refers to lifestyle choices and visits with your health care provider that can promote health and wellness. What does preventive care  include? A yearly physical exam. This is also called an annual well check. Dental exams once or twice a year. Routine eye exams. Ask your health care provider how often you should have your eyes checked. Personal lifestyle choices, including: Daily care of your teeth and gums. Regular physical activity. Eating a healthy diet. Avoiding tobacco and drug use. Limiting alcohol use. Practicing safe sex. Taking low-dose aspirin every day. Taking vitamin and mineral supplements as recommended by your health care provider. What happens during an annual well check? The services and screenings done by your health care provider during your annual well check will depend on your age, overall health, lifestyle risk factors, and family history of disease. Counseling  Your health care provider may ask you questions about your: Alcohol use. Tobacco use. Drug use. Emotional well-being. Home and relationship well-being. Sexual activity. Eating habits. History of falls. Memory and ability to understand (cognition). Work and work Astronomer. Reproductive health. Screening  You may have the following tests or measurements: Height, weight, and BMI. Blood pressure. Lipid and cholesterol levels. These may be checked every 5 years, or more frequently if you are over 75 years old. Skin check. Lung cancer screening. You may have this screening every year starting at age 81 if you have a 30-pack-year history of smoking and currently smoke or have quit within the past 15 years. Fecal occult blood test (FOBT) of the stool. You may have this test every year starting at age 18. Flexible sigmoidoscopy or colonoscopy. You may have a sigmoidoscopy every 5 years or a colonoscopy every 10 years starting at age 31. Hepatitis C blood test. Hepatitis B blood test. Sexually transmitted disease (STD) testing. Diabetes screening. This is done by checking your blood sugar (glucose) after you have not eaten for a while  (fasting). You may have this done every 1-3 years. Bone density scan. This is done to screen for osteoporosis. You may have this done starting at age 78. Mammogram. This may be done every 1-2 years. Talk to your health care provider about how often you should have regular mammograms. Talk with your health care provider about your test results, treatment options, and if necessary, the need for more tests. Vaccines  Your health care provider may recommend certain vaccines, such as: Influenza vaccine. This is recommended every year. Tetanus, diphtheria, and acellular pertussis (Tdap, Td) vaccine. You may need a Td booster every 10 years. Zoster vaccine. You may need this after age 98. Pneumococcal 13-valent conjugate (PCV13) vaccine. One dose is recommended after age 46. Pneumococcal polysaccharide (PPSV23) vaccine. One dose is recommended after age 2. Talk to your health care provider about which screenings and vaccines you need and how often you need them. This information is not intended to replace advice given to you by your health care provider. Make sure you discuss any questions you have with your health care provider. Document Released: 09/15/2015 Document Revised: 05/08/2016 Document Reviewed: 06/20/2015 Elsevier Interactive Patient Education  2017 [employer].  Fall Prevention in the Home Falls can cause injuries. They can happen to people of all ages. There are many things you can do to make your home safe and to help prevent falls. What can I do on the outside of my home? Regularly fix the edges of walkways and driveways and fix any cracks. Remove anything that might make you trip  as you walk through a door, such as a raised step or threshold. Trim any bushes or trees on the path to your home. Use bright outdoor lighting. Clear any walking paths of anything that might make someone trip, such as rocks or tools. Regularly check to see if handrails are loose or broken. Make sure that  both sides of any steps have handrails. Any raised decks and porches should have guardrails on the edges. Have any leaves, snow, or ice cleared regularly. Use sand or salt on walking paths during winter. Clean up any spills in your garage right away. This includes oil or grease spills. What can I do in the bathroom? Use night lights. Install grab bars by the toilet and in the tub and shower. Do not use towel bars as grab bars. Use non-skid mats or decals in the tub or shower. If you need to sit down in the shower, use a plastic, non-slip stool. Keep the floor dry. Clean up any water that spills on the floor as soon as it happens. Remove soap buildup in the tub or shower regularly. Attach bath mats securely with double-sided non-slip rug tape. Do not have throw rugs and other things on the floor that can make you trip. What can I do in the bedroom? Use night lights. Make sure that you have a light by your bed that is easy to reach. Do not use any sheets or blankets that are too big for your bed. They should not hang down onto the floor. Have a firm chair that has side arms. You can use this for support while you get dressed. Do not have throw rugs and other things on the floor that can make you trip. What can I do in the kitchen? Clean up any spills right away. Avoid walking on wet floors. Keep items that you use a lot in easy-to-reach places. If you need to reach something above you, use a strong step stool that has a grab bar. Keep electrical cords out of the way. Do not use floor polish or wax that makes floors slippery. If you must use wax, use non-skid floor wax. Do not have throw rugs and other things on the floor that can make you trip. What can I do with my stairs? Do not leave any items on the stairs. Make sure that there are handrails on both sides of the stairs and use them. Fix handrails that are broken or loose. Make sure that handrails are as long as the stairways. Check  any carpeting to make sure that it is firmly attached to the stairs. Fix any carpet that is loose or worn. Avoid having throw rugs at the top or bottom of the stairs. If you do have throw rugs, attach them to the floor with carpet tape. Make sure that you have a light switch at the top of the stairs and the bottom of the stairs. If you do not have them, ask someone to add them for you. What else can I do to help prevent falls? Wear shoes that: Do not have high heels. Have rubber bottoms. Are comfortable and fit you well. Are closed at the toe. Do not wear sandals. If you use a stepladder: Make sure that it is fully opened. Do not climb a closed stepladder. Make sure that both sides of the stepladder are locked into place. Ask someone to hold it for you, if possible. Clearly mark and make sure that you can see: Any grab bars or  handrails. First and last steps. Where the edge of each step is. Use tools that help you move around (mobility aids) if they are needed. These include: Canes. Walkers. Scooters. Crutches. Turn on the lights when you go into a dark area. Replace any light bulbs as soon as they burn out. Set up your furniture so you have a clear path. Avoid moving your furniture around. If any of your floors are uneven, fix them. If there are any pets around you, be aware of where they are. Review your medicines with your doctor. Some medicines can make you feel dizzy. This can increase your chance of falling. Ask your doctor what other things that you can do to help prevent falls. This information is not intended to replace advice given to you by your health care provider. Make sure you discuss any questions you have with your health care provider. Document Released: 06/15/2009 Document Revised: 01/25/2016 Document Reviewed: 09/23/2014 Elsevier Interactive Patient Education  2017 [employer].

## 2023-03-13 DIAGNOSIS — D225 Melanocytic nevi of trunk: Secondary | ICD-10-CM | POA: Diagnosis not present

## 2023-03-13 DIAGNOSIS — D235 Other benign neoplasm of skin of trunk: Secondary | ICD-10-CM | POA: Diagnosis not present

## 2023-04-06 ENCOUNTER — Other Ambulatory Visit: Payer: Self-pay | Admitting: Family Medicine

## 2023-04-07 ENCOUNTER — Encounter: Payer: Medicare Other | Admitting: Family Medicine

## 2023-04-07 NOTE — Telephone Encounter (Signed)
Requested Prescriptions  Pending Prescriptions Disp Refills   rosuvastatin (CRESTOR) 40 MG tablet [Pharmacy Med Name: Rosuvastatin Calcium 40 MG Oral Tablet] 90 tablet 0    Sig: TAKE 1 TABLET BY MOUTH DAILY     Cardiovascular:  Antilipid - Statins 2 Failed - 04/06/2023  5:13 AM      Failed - Lipid Panel in normal range within the last 12 months    Cholesterol, Total  Date Value Ref Range Status  04/11/2022 150 100 - 199 mg/dL Final   LDL Chol Calc (NIH)  Date Value Ref Range Status  04/11/2022 88 0 - 99 mg/dL Final   HDL  Date Value Ref Range Status  04/11/2022 38 (L) >39 mg/dL Final   Triglycerides  Date Value Ref Range Status  04/11/2022 137 0 - 149 mg/dL Final         Passed - Cr in normal range and within 360 days    Creatinine  Date Value Ref Range Status  12/26/2014 0.53 mg/dL Final    Comment:    1.61-0.96 NOTE: New Reference Range  11/08/14    Creatinine, Ser  Date Value Ref Range Status  02/27/2023 0.70 0.44 - 1.00 mg/dL Final         Passed - Patient is not pregnant      Passed - Valid encounter within last 12 months    Recent Outpatient Visits           2 months ago Adjustment disorder with anxious mood   [place] [facility] Merita Norton T, FNP   7 months ago COPD with hypoxia [facility])   [place] [facility] Jacky Kindle, FNP   12 months ago Annual physical exam   [facility] Merita Norton T, FNP   1 year ago Change in stool   [facility] Jacky Kindle, FNP   1 year ago Prediabetes   [facility] Jacky Kindle, FNP       Future Appointments             In 1 week Jacky Kindle, FNP [facility]

## 2023-04-16 ENCOUNTER — Encounter: Payer: Self-pay | Admitting: Family Medicine

## 2023-04-16 ENCOUNTER — Ambulatory Visit (INDEPENDENT_AMBULATORY_CARE_PROVIDER_SITE_OTHER): Payer: 59 | Admitting: Family Medicine

## 2023-04-16 VITALS — BP 129/60 | HR 76 | Ht 67.0 in | Wt 172.6 lb

## 2023-04-16 DIAGNOSIS — I152 Hypertension secondary to endocrine disorders: Secondary | ICD-10-CM

## 2023-04-16 DIAGNOSIS — Z0000 Encounter for general adult medical examination without abnormal findings: Secondary | ICD-10-CM

## 2023-04-16 DIAGNOSIS — E1169 Type 2 diabetes mellitus with other specified complication: Secondary | ICD-10-CM | POA: Diagnosis not present

## 2023-04-16 DIAGNOSIS — E1159 Type 2 diabetes mellitus with other circulatory complications: Secondary | ICD-10-CM

## 2023-04-16 DIAGNOSIS — F17200 Nicotine dependence, unspecified, uncomplicated: Secondary | ICD-10-CM

## 2023-04-16 DIAGNOSIS — E785 Hyperlipidemia, unspecified: Secondary | ICD-10-CM

## 2023-04-16 DIAGNOSIS — E119 Type 2 diabetes mellitus without complications: Secondary | ICD-10-CM | POA: Insufficient documentation

## 2023-04-16 DIAGNOSIS — J418 Mixed simple and mucopurulent chronic bronchitis: Secondary | ICD-10-CM

## 2023-04-16 DIAGNOSIS — F4322 Adjustment disorder with anxiety: Secondary | ICD-10-CM

## 2023-04-16 NOTE — Assessment & Plan Note (Signed)
Chronic, repeat LP LDL goal <70 recommend diet low in saturated fat and regular exercise - 30 min at least 5 times per week Continues on crestor 40 mg

## 2023-04-16 NOTE — Assessment & Plan Note (Signed)
Chronic, pt remains pre-contemplative at this time iso cessation efforts The 10-year ASCVD risk score (Arnett DK, et al., 2019) is: 30.5%

## 2023-04-16 NOTE — Progress Notes (Signed)
Complete physical exam   Patient: Alexandria [name]   DOB: 1956/02/09   67 y.o. Female  MRN: [id number] Visit Date: 04/16/2023  Today's healthcare provider: Jacky Kindle, FNP  Re Introduced to nurse practitioner role and practice setting.  All questions answered.  Discussed provider/patient relationship and expectations.  Chief Complaint  Patient presents with   Annual Exam    General diet Exercise-walking 15-20 mins Feeling well Sleeping poorly Concerns - none   Subjective    Alexandria [name] is a 67 y.o. female who presents today for a complete physical exam.  She reports consuming a general diet. The patient does not participate in regular exercise at present. She generally feels well. She reports sleeping poorly. She does have additional problems to discuss today.  HPI HPI     Annual Exam    Additional comments: General diet Exercise-walking 15-20 mins Feeling well Sleeping poorly Concerns - none      Last edited by Acey Lav, CMA on 04/16/2023  9:47 AM.      Past Medical History:  Diagnosis Date   Anxiety    Cancer (HCC)    Carbuncle and furuncle of trunk    COPD (chronic obstructive pulmonary disease) (HCC)    Depression    Diabetes mellitus without complication (HCC)    Dyspnea    GERD (gastroesophageal reflux disease)    HTN (hypertension)    Hyperlipidemia    Intrinsic asthma, unspecified    Methicillin resistant Staphylococcus aureus in conditions classified elsewhere and of unspecified site    Other psychological or physical stress, not elsewhere classified(V62.89)    Pneumonia    Pre-diabetes    Tobacco abuse    Past Surgical History:  Procedure Laterality Date   BREAST BIOPSY Left 06/26/2022   stereo biopsy/ coil clip/ path pending   BREAST LUMPECTOMY,RADIO FREQ LOCALIZER,AXILLARY SENTINEL LYMPH NODE BIOPSY Left 07/17/2022   Procedure: BREAST LUMPECTOMY,RADIO FREQ LOCALIZER,AXILLARY SENTINEL LYMPH NODE BIOPSY;  Surgeon: Henrene Dodge, MD;   Location: [facility];  Service: General;  Laterality: Left;   COLONOSCOPY WITH PROPOFOL N/A 04/10/2015   Procedure: COLONOSCOPY WITH PROPOFOL;  Surgeon: Midge Minium, MD;  Location: [facility];  Service: Endoscopy;  Laterality: N/A;   COLONOSCOPY WITH PROPOFOL N/A 06/25/2021   Procedure: COLONOSCOPY WITH PROPOFOL;  Surgeon: Midge Minium, MD;  Location: [facility];  Service: Endoscopy;  Laterality: N/A;  leave at 8 arrival   ESOPHAGOGASTRODUODENOSCOPY (EGD) WITH PROPOFOL N/A 12/06/2021   Procedure: ESOPHAGOGASTRODUODENOSCOPY (EGD) WITH PROPOFOL;  Surgeon: Midge Minium, MD;  Location: [facility];  Service: Endoscopy;  Laterality: N/A;   POLYPECTOMY  04/10/2015   Procedure: POLYPECTOMY;  Surgeon: Midge Minium, MD;  Location: [facility];  Service: Endoscopy;;   POLYPECTOMY N/A 06/25/2021   Procedure: POLYPECTOMY;  Surgeon: Midge Minium, MD;  Location: [facility];  Service: Endoscopy;  Laterality: N/A;   STATUS POST ENDOMETRIAL ABLATION     TONSILLECTOMY     TUBAL LIGATION     bilateral, sterile ablation   VARICOSE VEINS     Social History   Socioeconomic History   Marital status: Widowed    Spouse name: Not on file   Number of children: 3   Years of education: 12   Highest education level: Not on file  Occupational History   Occupation: [employer]    Comment: CNA  Tobacco Use   Smoking status: Every Day    Current packs/day: 0.00    Average  packs/day: 0.5 packs/day for 30.0 years (15.0 ttl pk-yrs)    Types: Cigarettes    Start date: 02/01/1987    Last attempt to quit: 01/31/2017    Years since quitting: 6.2    Passive exposure: Past   Smokeless tobacco: Never   Tobacco comments:    Currently smoking 10 cigarettes a day  Vaping Use   Vaping status: Never Used  Substance and Sexual Activity   Alcohol use: No   Drug use: No   Sexual activity: Not Currently    Birth control/protection: Abstinence  Other Topics Concern   Not  on file  Social History Narrative   Lives alone - daughter stays with her most of the time   Social Determinants of Health   Financial Resource Strain: Low Risk  (03/12/2023)   Overall Financial Resource Strain (CARDIA)    Difficulty of Paying Living Expenses: Not very hard  Food Insecurity: No Food Insecurity (03/12/2023)   Hunger Vital Sign    Worried About Running Out of Food in the Last Year: Never true    Ran Out of Food in the Last Year: Never true  Transportation Needs: No Transportation Needs (03/12/2023)   PRAPARE - Administrator, Civil Service (Medical): No    Lack of Transportation (Non-Medical): No  Physical Activity: Inactive (03/12/2023)   Exercise Vital Sign    Days of Exercise per Week: 0 days    Minutes of Exercise per Session: 0 min  Stress: Stress Concern Present (03/12/2023)   [employer] of Occupational Health - Occupational Stress Questionnaire    Feeling of Stress : To some extent  Social Connections: Socially Isolated (03/12/2023)   Social Connection and Isolation Panel [NHANES]    Frequency of Communication with Friends and Family: More than three times a week    Frequency of Social Gatherings with Friends and Family: More than three times a week    Attends Religious Services: Never    Database administrator or Organizations: No    Attends Banker Meetings: Never    Marital Status: Widowed  Intimate Partner Violence: Not At Risk (03/12/2023)   Humiliation, Afraid, Rape, and Kick questionnaire    Fear of Current or Ex-Partner: No    Emotionally Abused: No    Physically Abused: No    Sexually Abused: No   Family Status  Relation Name Status   Mother Hallie Deceased   Father  Deceased at age 63   Sister  Alive   Brother  Alive       HEART PROBLEMS   Sister  Alive   Sister  Alive       PACEMAKER   Daughter  Alive       OBESE AND HYPERTENSION   Daughter  Alive       OBESE AND HYPERTENSION   Daughter  Alive        SEIZURES   Sister Bonita Quin (Not Specified)   Daughter Amy (Not Specified)   Daughter April (Not Specified)   Daughter  (Not Specified)   Daughter  (Not Specified)   Neg Hx  (Not Specified)  No partnership data on file   Family History  Problem Relation Age of Onset   Coronary artery disease Mother    Hypertension Mother    Rheum arthritis Mother    Heart attack Mother    Lung cancer Father    Emphysema Father    Gout Father    Diabetes Sister    Hypertension  Daughter    Hypertension Daughter    Obesity Daughter    Obesity Daughter    Seizures Daughter    Breast cancer Neg Hx    Bladder Cancer Neg Hx    Kidney cancer Neg Hx    Allergies  Allergen Reactions   Pravastatin Sodium     Myalgia    Patient Care Team: Jacky Kindle, FNP as PCP - General (Family Medicine) Hulen Luster, RN as Oncology Nurse Navigator Juanell Fairly, RN as Case Manager   Medications: Outpatient Medications Prior to Visit  Medication Sig   acetaminophen (TYLENOL) 500 MG tablet Take 1,000 mg by mouth every 6 (six) hours as needed for mild pain.   albuterol (VENTOLIN HFA) 108 (90 Base) MCG/ACT inhaler Inhale 1-2 puffs into the lungs every 6 (six) hours as needed for wheezing or shortness of breath.   amLODipine (NORVASC) 10 MG tablet TAKE 1 TABLET BY MOUTH DAILY   cetirizine (ZYRTEC) 10 MG tablet Take 1 tablet (10 mg total) by mouth daily.   clonazePAM (KLONOPIN) 0.5 MG tablet TAKE ONE-HALF TABLET BY MOUTH 3  TIMES DAILY AS NEEDED FOR  ANXIETY   famotidine (PEPCID) 20 MG tablet Take 20 mg by mouth as needed for heartburn.   hydrochlorothiazide (HYDRODIURIL) 25 MG tablet TAKE 1 TABLET BY MOUTH DAILY   letrozole (FEMARA) 2.5 MG tablet TAKE 1 TABLET BY MOUTH DAILY  (START 1 WEEK AFTER COMPLETION  OF RADIATION)   loperamide (IMODIUM) 2 MG capsule TAKE 1 CAPSULE BY MOUTH 4 TIMES  DAILY AS NEEDED FOR DIARRHEA OR  LOOSE STOOLS   metFORMIN (GLUCOPHAGE-XR) 750 MG 24 hr tablet TAKE 1 TABLET BY MOUTH DAILY   WITH BREAKFAST   ondansetron (ZOFRAN-ODT) 4 MG disintegrating tablet Take 1 tablet (4 mg total) by mouth every 8 (eight) hours as needed for nausea or vomiting.   rosuvastatin (CRESTOR) 40 MG tablet TAKE 1 TABLET BY MOUTH DAILY   Spacer/Aero-Holding Chambers (AEROCHAMBER MV) inhaler Use as instructed   TRELEGY ELLIPTA 100-62.5-25 MCG/ACT AEPB USE 1 INHALATION BY MOUTH DAILY   No facility-administered medications prior to visit.    Review of Systems Last CBC Lab Results  Component Value Date   WBC 6.7 02/27/2023   HGB 14.8 02/27/2023   HCT 43.8 02/27/2023   MCV 86.4 02/27/2023   MCH 29.2 02/27/2023   RDW 13.8 02/27/2023   PLT 276 02/27/2023   Last metabolic panel Lab Results  Component Value Date   GLUCOSE 104 (H) 02/27/2023   NA 137 02/27/2023   K 3.5 02/27/2023   CL 101 02/27/2023   CO2 27 02/27/2023   BUN 11 02/27/2023   CREATININE 0.70 02/27/2023   GFRNONAA >60 02/27/2023   CALCIUM 10.0 02/27/2023   PROT 7.2 02/27/2023   ALBUMIN 4.1 02/27/2023   LABGLOB 2.2 04/11/2022   AGRATIO 2.1 04/11/2022   BILITOT 0.3 02/27/2023   ALKPHOS 85 02/27/2023   AST 17 02/27/2023   ALT 15 02/27/2023   ANIONGAP 9 02/27/2023   Last lipids Lab Results  Component Value Date   CHOL 150 04/11/2022   HDL 38 (L) 04/11/2022   LDLCALC 88 04/11/2022   TRIG 137 04/11/2022   CHOLHDL 3.9 04/11/2022   Last hemoglobin A1c Lab Results  Component Value Date   HGBA1C 6.1 (H) 04/11/2022   Last thyroid functions Lab Results  Component Value Date   TSH 1.570 04/11/2022     Objective    BP 129/60 (BP Location: Right Arm, Patient Position: Sitting, Cuff  Size: Normal)   Pulse 76   Ht 5\' 7"  (1.702 m)   Wt 172 lb 9.6 oz (78.3 kg)   SpO2 98%   BMI 27.03 kg/m   BP Readings from Last 3 Encounters:  04/16/23 129/60  02/27/23 (!) 148/72  02/05/23 122/75   Wt Readings from Last 3 Encounters:  04/16/23 172 lb 9.6 oz (78.3 kg)  03/12/23 171 lb (77.6 kg)  02/27/23 171 lb 3.2 oz (77.7 kg)    SpO2 Readings from Last 3 Encounters:  04/16/23 98%  02/27/23 98%  02/05/23 98%   Physical Exam Vitals and nursing note reviewed.  Constitutional:      General: She is awake. She is not in acute distress.    Appearance: Normal appearance. She is well-developed, well-groomed and overweight. She is not ill-appearing, toxic-appearing or diaphoretic.  HENT:     Head: Normocephalic and atraumatic.     Jaw: There is normal jaw occlusion. No trismus, tenderness, swelling or pain on movement.     Right Ear: Hearing, tympanic membrane, ear canal and external ear normal. There is no impacted cerumen.     Left Ear: Hearing, tympanic membrane, ear canal and external ear normal. There is no impacted cerumen.     Nose: Nose normal. No congestion or rhinorrhea.     Right Turbinates: Not enlarged, swollen or pale.     Left Turbinates: Not enlarged, swollen or pale.     Right Sinus: No maxillary sinus tenderness or frontal sinus tenderness.     Left Sinus: No maxillary sinus tenderness or frontal sinus tenderness.     Mouth/Throat:     Lips: Pink.     Mouth: Mucous membranes are moist. No injury.     Tongue: No lesions.     Pharynx: Oropharynx is clear. Uvula midline. No pharyngeal swelling, oropharyngeal exudate, posterior oropharyngeal erythema or uvula swelling.     Tonsils: No tonsillar exudate or tonsillar abscesses.  Eyes:     General: Lids are normal. Lids are everted, no foreign bodies appreciated. Vision grossly intact. Gaze aligned appropriately. No allergic shiner or visual field deficit.       Right eye: No discharge.        Left eye: No discharge.     Extraocular Movements: Extraocular movements intact.     Conjunctiva/sclera: Conjunctivae normal.     Right eye: Right conjunctiva is not injected. No exudate.    Left eye: Left conjunctiva is not injected. No exudate.    Pupils: Pupils are equal, round, and reactive to light.  Neck:     Thyroid: No thyroid mass, thyromegaly or  thyroid tenderness.     Vascular: No carotid bruit.     Trachea: Trachea normal.  Cardiovascular:     Rate and Rhythm: Normal rate and regular rhythm.     Pulses: Normal pulses.          Carotid pulses are 2+ on the right side and 2+ on the left side.      Radial pulses are 2+ on the right side and 2+ on the left side.       Dorsalis pedis pulses are 2+ on the right side and 2+ on the left side.       Posterior tibial pulses are 2+ on the right side and 2+ on the left side.     Heart sounds: Normal heart sounds, S1 normal and S2 normal. No murmur heard.    No friction rub. No gallop.  Pulmonary:  Effort: Pulmonary effort is normal. No respiratory distress.     Breath sounds: Normal breath sounds and air entry. No stridor. No wheezing, rhonchi or rales.  Chest:     Chest wall: No tenderness.  Abdominal:     General: Abdomen is flat. Bowel sounds are normal. There is no distension.     Palpations: Abdomen is soft. There is no mass.     Tenderness: There is no abdominal tenderness. There is no right CVA tenderness, left CVA tenderness, guarding or rebound.     Hernia: No hernia is present.  Genitourinary:    Comments: Exam deferred; denies complaints Musculoskeletal:        General: No swelling, tenderness, deformity or signs of injury. Normal range of motion.     Cervical back: Full passive range of motion without pain, normal range of motion and neck supple. No edema, rigidity or tenderness. No muscular tenderness.     Right lower leg: No edema.     Left lower leg: No edema.  Lymphadenopathy:     Cervical: No cervical adenopathy.     Right cervical: No superficial, deep or posterior cervical adenopathy.    Left cervical: No superficial, deep or posterior cervical adenopathy.  Skin:    General: Skin is warm and dry.     Capillary Refill: Capillary refill takes less than 2 seconds.     Coloration: Skin is not jaundiced or pale.     Findings: No bruising, erythema, lesion or rash.   Neurological:     General: No focal deficit present.     Mental Status: She is alert and oriented to person, place, and time. Mental status is at baseline.     GCS: GCS eye subscore is 4. GCS verbal subscore is 5. GCS motor subscore is 6.     Sensory: Sensation is intact. No sensory deficit.     Motor: Motor function is intact. No weakness.     Coordination: Coordination is intact. Coordination normal.     Gait: Gait is intact. Gait normal.  Psychiatric:        Attention and Perception: Attention and perception normal.        Mood and Affect: Mood and affect normal.        Speech: Speech normal.        Behavior: Behavior normal. Behavior is cooperative.        Thought Content: Thought content normal.        Cognition and Memory: Cognition and memory normal.        Judgment: Judgment normal.     Last depression screening scores    04/16/2023    9:48 AM 03/12/2023    9:33 AM 08/29/2022    9:30 AM  PHQ 2/9 Scores  PHQ - 2 Score 1 0 1   Last fall risk screening    04/16/2023    9:48 AM  Fall Risk   Falls in the past year? 0  Injury with Fall? 0  Risk for fall due to : No Fall Risks  Follow up Falls evaluation completed   Last Audit-C alcohol use screening    03/12/2023    9:31 AM  Alcohol Use Disorder Test (AUDIT)  1. How often do you have a drink containing alcohol? 0  2. How many drinks containing alcohol do you have on a typical day when you are drinking? 0  3. How often do you have six or more drinks on one occasion? 0  AUDIT-C Score 0  A score of 3 or more in women, and 4 or more in men indicates increased risk for alcohol abuse, EXCEPT if all of the points are from question 1   No results found for any visits on 04/16/23.  Assessment & Plan    Routine Health Maintenance and Physical Exam  Exercise Activities and Dietary recommendations  Goals      DIET - EAT MORE FRUITS AND VEGETABLES     Goal: CCM (Breast Cancer) Expected Outcome:  Monitor, Self-Manage And  Reduce Symptoms of Breast Cancer Treatments     Current Barriers:  Chronic Disease Management support and education needs related to Breast Cancer  Planned Interventions: Reviewed plan for treatment of malignant neoplasm of the left breast. Reports completing radiation therapy as planned. Reports tolerating well. Completed last treatment on 09/27/22. Will start Femara on 10/03/22. Discussed ability to complete ADLs and care coordination needs. Remains independent with ADLs and IADLs. Denies changes or decline in functional status. Discussed symptoms since treatment. Reports some fatigue but overall doing well. Notes radiation rash to left breast and armpit. Reports using Aquaphor as advised by the Radiation Oncology team. Wearing loose shirts to prevent further irritation. Aware of need to follow up if rash worsens or drainage is noted. Reviewed pending appointments. Will follow up with the Radiation Oncology team on 10/24/22. Will follow up with Oncologist on 10/29/22.   Symptom Management: Attend all provider appointments Take medications as prescribed Update the care management team to address care coordination concerns as needed Call provider office for new concerns or questions   Follow Up Plan:  Will follow up next month       Goal: CCM (COPD) Expected Outcome:  Monitor, Self-Manage And Reduce Symptoms of Chronic Obstructive Pulmonary Disease     Current Barriers:  Chronic Disease Management support and education needs related to COPD  Planned Interventions: Reviewed provider's plan for COPD management. Reports taking medications as prescribed. Confirmed order for new albuterol inhalers. Reviewed symptoms. Reports symptoms have been well controlled. Experiencing fatigue but otherwise doing well.  Attempting to decrease use but reports currently smoking daily. Discussed various options and resources for smoking cessation. Declined offer for counseling. Agreed to update the care management  team if additional resources are needed. Reviewed importance of monitoring symptoms daily. Advised to contact a provider and make an appointment if experiencing moderate symptoms for greater than 48 hours without improvement.  Provided information regarding infection prevention and increased risk r/t COPD. Advised to utilize prevention strategies to reduce risk of respiratory infection. Reviewed worsening symptoms that require immediate medical attention.   Symptom Management: Call pharmacy for medication refills 3-7 days in advance of running out of medications Identify and remove indoor air pollutants Do breathing exercises every day Eliminate symptom triggers at home Practice relaxation or meditation daily Consider plan for smoking cessation Follow rescue plan if symptoms flare-up Call provider office for new concerns or questions   Follow Up Plan:   Will follow up next month       Goal: CCM (Diabetes) Expected Outcome:  Monitor, Self-Manage And Reduce Symptoms of Diabetes     Current Barriers:  Chronic Disease Management support and education needs related to Diabetes  Planned Interventions: Reviewed provider's plan for Diabetes Management. Reviewed medications and discussed importance of medication adherence.  Reports currently taking medications as prescribed. Denies current concerns related to medication management or prescription cost. Agreed to update the care management team with changes. Reports currently not being required to monitor fasting  blood glucose. A1C is currently within goal.  Reviewed s/sx of hypoglycemia and hyperglycemia along with recommended interventions. Reports episodes of fatigue that she attributes to radiation treatment. Denies hypoglycemic or hyperglycemic episodes. Discussed nutritional intake and importance of complying with a diabetic diet. Reports doing well with diet adherence. Notes decreased appetite since starting radiation within the last week.  Thorough discussion regarding meal and snack options to increase intake of protein and maintain optimal glycemic levels.  Discussed importance of completing recommended DM preventive care. Advised to complete foot care and annual eye exams as recommended. Discussed importance of completing ordered labs as prescribed.  Assessed social determinant of health barriers 09/27/22: No Changes   Lab Results  Component Value Date   HGBA1C 6.1 (H) 04/11/2022    Symptom Management: Take medications as prescribed   Attend all scheduled provider appointments Call pharmacy for medication refills 3-7 days in advance of running out of medications Keep appointment with eye doctor Check feet daily for cuts, sores or redness Wash and dry feet carefully every day Wear comfortable, cotton socks Wear comfortable, well-fitting shoes Continue reading nutrition labels and adhering to recommended diabetic diet. Call provider office for new concerns or questions   Follow Up Plan:  Will follow up next month              management of COPD     Care Coordination Interventions: Provided patient with basic written and verbal COPD education on self care/management/and exacerbation prevention Advised patient to track and manage COPD triggers Provided instruction about proper use of medications used for management of COPD including inhalers Advised patient to self assesses COPD action plan zone and make appointment with provider if in the yellow zone for 48 hours without improvement      New diagnosis of breast cancer     Care Coordination Interventions: Assessed patient understanding of cancer diagnosis and recommended treatment plan Reviewed upcoming provider appointments and treatment appointments Assessed available transportation to appointments and treatments. Has consistent/reliable transportation: Yes Assessed support system. Has consistent/reliable family or other support: Yes Reviewed appointment  dates: hospital follow up on 12/11, genetic counseling on 12/14, oncology for radiation mapping on 12/20 Discussed post op recovery, state she has done well with surgery, recovering now from COPD exacerbation.   Discussed post op plan - will have better idea of plan after follow up appointments.  Currently, planning radiation therapy Advised of contact person for oncology/nurse navigator          Immunization History  Administered Date(s) Administered   Fluad Quad(high Dose 65+) 07/17/2021   Influenza Inj Mdck Quad Pf 06/02/2022   Influenza,inj,Quad PF,6+ Mos 05/19/2018   Influenza-Unspecified 06/14/2019   PNEUMOCOCCAL CONJUGATE-20 04/11/2022   Pneumococcal Polysaccharide-23 03/10/2015, 04/03/2020   Respiratory Syncytial Virus Vaccine,Recomb Aduvanted(Arexvy) 06/02/2022   Tdap 03/17/2017   Zoster Recombinant(Shingrix) 06/12/2022   Zoster, Live 03/10/2015    Health Maintenance  Topic Date Due   COVID-19 Vaccine (1) Never done   Zoster Vaccines- Shingrix (2 of 2) 08/07/2022   HEMOGLOBIN A1C  10/12/2022   INFLUENZA VACCINE  04/03/2023   OPHTHALMOLOGY EXAM  04/27/2023   Diabetic kidney evaluation - Urine ACR  08/13/2023   FOOT EXAM  08/13/2023   Diabetic kidney evaluation - eGFR measurement  02/27/2024   Medicare Annual Wellness (AWV)  04/15/2024   MAMMOGRAM  05/15/2024   Colonoscopy  06/25/2026   DTaP/Tdap/Td (2 - Td or Tdap) 03/18/2027   Pneumonia Vaccine 75+ Years old  Completed   DEXA  SCAN  Completed   Hepatitis C Screening  Completed   HPV VACCINES  Aged Out    Discussed health benefits of physical activity, and encouraged her to engage in regular exercise appropriate for her age and condition.  Problem List Items Addressed This Visit       Cardiovascular and Mediastinum   Hypertension associated with diabetes (HCC)   Relevant Orders   Comprehensive metabolic panel   CBC with Differential/Platelet   TSH     Endocrine   Hyperlipidemia associated with type 2  diabetes mellitus (HCC)   Relevant Orders   Lipid Panel With LDL/HDL Ratio   Other Visit Diagnoses     Encounter for Medicare annual wellness exam    -  Primary   DM type 2, goal HbA1c < 7% (HCC)       Relevant Orders   HgB A1c   Comprehensive metabolic panel      No follow-ups on file.    Leilani Merl, FNP, have reviewed all documentation for this visit. The documentation on 04/16/23 for the exam, diagnosis, procedures, and orders are all accurate and complete.  Jacky Kindle, FNP  [facility] [phone] (phone) [phone] (fax)  [facility]

## 2023-04-16 NOTE — Assessment & Plan Note (Signed)
Chronic, stable Continues to use tobacco products Denies exacerbation

## 2023-04-16 NOTE — Patient Instructions (Signed)
The CDC recommends two doses of Shingrix (the new shingles vaccine) separated by 2 to 6 months for adults age 68 years and older. I recommend checking with your insurance plan regarding coverage for this vaccine.    

## 2023-04-16 NOTE — Assessment & Plan Note (Signed)
Chronic, variable Continues on klonopin at 90#/60 days at 0.5 mg each Endorses some difficulty sleeping

## 2023-04-16 NOTE — Assessment & Plan Note (Signed)
Chronic, borderline Goal 129/79 Continue norvasc 10 mg, hydrochlorothiazide 25

## 2023-04-16 NOTE — Assessment & Plan Note (Signed)
Chronic, well controlled Repeat A1c Continues on metformin 750 mg

## 2023-04-16 NOTE — Assessment & Plan Note (Signed)

## 2023-04-17 LAB — CBC WITH DIFFERENTIAL/PLATELET
Basophils Absolute: 0.1 10*3/uL (ref 0.0–0.2)
Basos: 1 %
EOS (ABSOLUTE): 0.3 10*3/uL (ref 0.0–0.4)
Eos: 4 %
Hematocrit: 45.2 % (ref 34.0–46.6)
Hemoglobin: 15.2 g/dL (ref 11.1–15.9)
Immature Grans (Abs): 0 10*3/uL (ref 0.0–0.1)
Immature Granulocytes: 0 %
Lymphocytes Absolute: 3 10*3/uL (ref 0.7–3.1)
Lymphs: 34 %
MCH: 29.4 pg (ref 26.6–33.0)
MCHC: 33.6 g/dL (ref 31.5–35.7)
MCV: 87 fL (ref 79–97)
Monocytes Absolute: 0.5 10*3/uL (ref 0.1–0.9)
Monocytes: 5 %
Neutrophils Absolute: 4.9 10*3/uL (ref 1.4–7.0)
Neutrophils: 56 %
Platelets: 284 10*3/uL (ref 150–450)
RBC: 5.17 x10E6/uL (ref 3.77–5.28)
RDW: 13.8 % (ref 11.7–15.4)
WBC: 8.8 10*3/uL (ref 3.4–10.8)

## 2023-04-17 LAB — COMPREHENSIVE METABOLIC PANEL
ALT: 14 IU/L (ref 0–32)
AST: 17 IU/L (ref 0–40)
Albumin: 4.4 g/dL (ref 3.9–4.9)
Alkaline Phosphatase: 111 IU/L (ref 44–121)
BUN/Creatinine Ratio: 13 (ref 12–28)
BUN: 10 mg/dL (ref 8–27)
Bilirubin Total: 0.4 mg/dL (ref 0.0–1.2)
CO2: 26 mmol/L (ref 20–29)
Calcium: 11 mg/dL — ABNORMAL HIGH (ref 8.7–10.3)
Chloride: 96 mmol/L (ref 96–106)
Creatinine, Ser: 0.79 mg/dL (ref 0.57–1.00)
Globulin, Total: 2.2 g/dL (ref 1.5–4.5)
Glucose: 113 mg/dL — ABNORMAL HIGH (ref 70–99)
Potassium: 4.3 mmol/L (ref 3.5–5.2)
Sodium: 137 mmol/L (ref 134–144)
Total Protein: 6.6 g/dL (ref 6.0–8.5)
eGFR: 81 mL/min/{1.73_m2} (ref >59)

## 2023-04-17 LAB — HEMOGLOBIN A1C
Est. average glucose Bld gHb Est-mCnc: 134 mg/dL
Hgb A1c MFr Bld: 6.3 % — ABNORMAL HIGH (ref 4.8–5.6)

## 2023-04-17 LAB — LIPID PANEL WITH LDL/HDL RATIO
Cholesterol, Total: 130 mg/dL (ref 100–199)
HDL: 46 mg/dL (ref >39)
LDL Chol Calc (NIH): 59 mg/dL (ref 0–99)
LDL/HDL Ratio: 1.3 ratio (ref 0.0–3.2)
Triglycerides: 142 mg/dL (ref 0–149)
VLDL Cholesterol Cal: 25 mg/dL (ref 5–40)

## 2023-04-17 LAB — TSH: TSH: 1.78 u[IU]/mL (ref 0.450–4.500)

## 2023-04-24 ENCOUNTER — Ambulatory Visit: Payer: 59 | Admitting: Radiation Oncology

## 2023-04-28 ENCOUNTER — Other Ambulatory Visit: Payer: Self-pay

## 2023-04-30 ENCOUNTER — Encounter: Payer: Self-pay | Admitting: Radiation Oncology

## 2023-04-30 ENCOUNTER — Ambulatory Visit
Admission: RE | Admit: 2023-04-30 | Discharge: 2023-04-30 | Disposition: A | Discharge status: Home / Self Care | Payer: 59 | Source: Ambulatory Visit | Attending: Radiation Oncology | Admitting: Radiation Oncology

## 2023-04-30 VITALS — BP 150/64 | HR 84 | Temp 96.2°F | Ht 67.0 in | Wt 174.1 lb

## 2023-04-30 DIAGNOSIS — Z79811 Long term (current) use of aromatase inhibitors: Secondary | ICD-10-CM | POA: Diagnosis not present

## 2023-04-30 DIAGNOSIS — Z923 Personal history of irradiation: Secondary | ICD-10-CM | POA: Diagnosis not present

## 2023-04-30 DIAGNOSIS — C50412 Malignant neoplasm of upper-outer quadrant of left female breast: Secondary | ICD-10-CM | POA: Insufficient documentation

## 2023-04-30 DIAGNOSIS — Z170 Estrogen receptor positive status [ER+]: Secondary | ICD-10-CM

## 2023-04-30 DIAGNOSIS — R5383 Other fatigue: Secondary | ICD-10-CM | POA: Diagnosis not present

## 2023-04-30 NOTE — Progress Notes (Signed)
Radiation Oncology Follow up Note  Name: Alexandria [name]   Date:   04/30/2023 MRN:  [id number] DOB: May 23, 1956    This 67 y.o. 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  Seen today in routine follow-up she is doing well specifically denies breast tenderness cough or bone pain.  She has a mammogram in October.  She is somewhat fatigued.  She is currently on Femara tolerating it well without side effect.  COMPLICATIONS OF TREATMENT: none  FOLLOW UP COMPLIANCE: keeps appointments   PHYSICAL EXAM:  BP (!) 150/64   Pulse 84   Temp (!) 96.2 F (35.7 C)   Ht 5\' 7"  (1.702 m)   Wt 174 lb 1.6 oz (79 kg)   BMI 27.27 kg/m  Lungs are clear to A&P cardiac examination essentially unremarkable with regular rate and rhythm. No dominant mass or nodularity is noted in either breast in 2 positions examined. Incision is well-healed. No axillary or supraclavicular adenopathy is appreciated. Cosmetic result is excellent.  Well-developed well-nourished patient in NAD. HEENT reveals PERLA, EOMI, discs not visualized.  Oral cavity is clear. No oral mucosal lesions are identified. Neck is clear without evidence of cervical or supraclavicular adenopathy. Lungs are clear to A&P. Cardiac examination is essentially unremarkable with regular rate and rhythm without murmur rub or thrill. Abdomen is benign with no organomegaly or masses noted. Motor sensory and DTR levels are equal and symmetric in the upper and lower extremities. Cranial nerves II through XII are grossly intact. Proprioception is intact. No peripheral adenopathy or edema is identified. No motor or sensory levels are noted. Crude visual  fields are within normal range.  RADIOLOGY RESULTS: No current films for review  PLAN: Present time patient is doing well now at 7 months for whole breast radiation and pleased with her overall progress.  I have asked to see her back in 6 months for follow-up and then will start once year follow-up visits.  Patient knows to call with any concerns.  I would like to take this opportunity to thank you for allowing me to participate in the care of your patient.[name], MD

## 2023-04-30 NOTE — Progress Notes (Signed)
Survivorship Care Plan visit completed.  Treatment summary reviewed and given to patient.  ASCO answers booklet reviewed and given to patient.  CARE program and Cancer Transitions discussed with patient along with other resources cancer center offers to patients and caregivers.  Patient verbalized understanding.    

## 2023-05-04 ENCOUNTER — Other Ambulatory Visit: Payer: Self-pay | Admitting: Family Medicine

## 2023-05-04 DIAGNOSIS — I10 Essential (primary) hypertension: Secondary | ICD-10-CM

## 2023-05-06 DIAGNOSIS — H2513 Age-related nuclear cataract, bilateral: Secondary | ICD-10-CM | POA: Diagnosis not present

## 2023-05-06 DIAGNOSIS — E119 Type 2 diabetes mellitus without complications: Secondary | ICD-10-CM | POA: Diagnosis not present

## 2023-05-06 DIAGNOSIS — H02831 Dermatochalasis of right upper eyelid: Secondary | ICD-10-CM | POA: Diagnosis not present

## 2023-05-06 LAB — HM DIABETES EYE EXAM

## 2023-05-06 NOTE — Telephone Encounter (Signed)
Requested Prescriptions  Pending Prescriptions Disp Refills   hydrochlorothiazide (HYDRODIURIL) 25 MG tablet [Pharmacy Med Name: hydroCHLOROthiazide 25 MG Oral Tablet] 100 tablet 0    Sig: TAKE 1 TABLET BY MOUTH DAILY     Cardiovascular: Diuretics - Thiazide Failed - 05/04/2023  5:11 AM      Failed - Last BP in normal range    BP Readings from Last 1 Encounters:  04/30/23 (!) 150/64         Passed - Cr in normal range and within 180 days    Creatinine  Date Value Ref Range Status  12/26/2014 0.53 mg/dL Final    Comment:    5.62-1.30 NOTE: New Reference Range  11/08/14    Creatinine, Ser  Date Value Ref Range Status  04/16/2023 0.79 0.57 - 1.00 mg/dL Final         Passed - K in normal range and within 180 days    Potassium  Date Value Ref Range Status  04/16/2023 4.3 3.5 - 5.2 mmol/L Final  12/25/2014 3.6 mmol/L Final    Comment:    3.5-5.1 NOTE: New Reference Range  11/08/14          Passed - Na in normal range and within 180 days    Sodium  Date Value Ref Range Status  04/16/2023 137 134 - 144 mmol/L Final  12/25/2014 133 (L) mmol/L Final    Comment:    135-145 NOTE: New Reference Range  11/08/14          Passed - Valid encounter within last 6 months    Recent Outpatient Visits           2 weeks ago Annual physical exam   [facility] Merita Norton T, FNP   3 months ago Adjustment disorder with anxious mood   [facility] Merita Norton T, FNP   8 months ago COPD with hypoxia [facility])   [facility] Jacky Kindle, FNP   1 year ago Annual physical exam   [facility] Jacky Kindle, FNP   1 year ago Change in stool   [facility] Jacky Kindle, FNP       Future Appointments             In 11 months Jacky Kindle, FNP [facility], [facility]             amLODipine (NORVASC) 10 MG tablet [Pharmacy  Med Name: amLODIPine Besylate 10 MG Oral Tablet] 100 tablet 0    Sig: TAKE 1 TABLET BY MOUTH DAILY     Cardiovascular: Calcium Channel Blockers 2 Failed - 05/04/2023  5:11 AM      Failed - Last BP in normal range    BP Readings from Last 1 Encounters:  04/30/23 (!) 150/64         Passed - Last Heart Rate in normal range    Pulse Readings from Last 1 Encounters:  04/30/23 84         Passed - Valid encounter within last 6 months    Recent Outpatient Visits           2 weeks ago Annual physical exam   [facility] Norton T, FNP   3 months ago Adjustment disorder with anxious mood   [facility] Mer[facility] Norton T, FNP   8 months ago COPD with hypoxia (  [facility])   [facility] Mer[facility] Norton T, FNP   1 year ago Annual physical exam   [facility] Jacky Kindle, FNP   1 year ago Change in stool   [facility] Jacky Kindle, FNP       Future Appointments             In 11 months Jacky Kindle, FNP [facility], [facility]

## 2023-05-08 ENCOUNTER — Other Ambulatory Visit: Payer: Self-pay | Admitting: Family Medicine

## 2023-05-08 DIAGNOSIS — F419 Anxiety disorder, unspecified: Secondary | ICD-10-CM

## 2023-05-14 ENCOUNTER — Encounter: Payer: Self-pay | Admitting: Family Medicine

## 2023-05-14 ENCOUNTER — Ambulatory Visit: Payer: Self-pay | Admitting: *Deleted

## 2023-05-14 NOTE — Telephone Encounter (Signed)
  Chief Complaint: requesting clarification or reorder of medication instructions for klonopin 0.5 mg Symptoms: na Frequency: na Pertinent Negatives: Patient denies na Disposition: [] ED /[] Urgent Care (no appt availability in office) / [] Appointment(In office/virtual)/ []  [facility]/ [] Home Care/ [] Refused Recommended Disposition /[] [place]/ [x]  Follow-up with PCP Additional Notes:   Patient reports she just received medication delivery and instructions are incorrect what she is used to taking. Patient reports she takes klonopin 0.5 mg tablets , takes 1- 1/2 tablets or 2 tablets at times as needed. Instructions are for patient to only take 0.5 mg (0.25  mg tablet) daily prn. Patient reports she will run out of medication too early due to different directions/ instructions on new Rx written 05/08/23. Please advise.     Reason for Disposition  [1] Caller has NON-URGENT medicine question about med that PCP prescribed AND [2] triager unable to answer question  Answer Assessment - Initial Assessment Questions 1. NAME of MEDICINE: "What medicine(s) are you calling about?"     Klonopin 0.5 mg tablets 2. QUESTION: "What is your question?" (e.g., double dose of medicine, side effect)     Order is not what patient normally takes and order needs to be rewritten and sent to pharmacy or clarified. 3. PRESCRIBER: "Who prescribed the medicine?" Reason: if prescribed by specialist, call should be referred to that group.     PCP 4. SYMPTOMS: "Do you have any symptoms?" If Yes, ask: "What symptoms are you having?"  "How bad are the symptoms (e.g., mild, moderate, severe)     na 5. PREGNANCY:  "Is there any chance that you are pregnant?" "When was your last menstrual period?"     na  Protocols used: Medication Question Call-A-AH

## 2023-06-09 ENCOUNTER — Ambulatory Visit
Admission: RE | Admit: 2023-06-09 | Discharge: 2023-06-09 | Disposition: A | Discharge status: Home / Self Care | Payer: 59 | Source: Ambulatory Visit | Attending: Internal Medicine | Admitting: Internal Medicine

## 2023-06-09 DIAGNOSIS — C50412 Malignant neoplasm of upper-outer quadrant of left female breast: Secondary | ICD-10-CM | POA: Insufficient documentation

## 2023-06-09 DIAGNOSIS — Z170 Estrogen receptor positive status [ER+]: Secondary | ICD-10-CM | POA: Diagnosis not present

## 2023-06-09 DIAGNOSIS — R92323 Mammographic fibroglandular density, bilateral breasts: Secondary | ICD-10-CM | POA: Diagnosis not present

## 2023-06-09 HISTORY — DX: Malignant neoplasm of unspecified site of unspecified female breast: C50.919

## 2023-06-09 HISTORY — DX: Personal history of irradiation: Z92.3

## 2023-06-10 ENCOUNTER — Other Ambulatory Visit: Payer: Self-pay | Admitting: Family Medicine

## 2023-06-13 ENCOUNTER — Other Ambulatory Visit: Payer: Self-pay | Admitting: Family Medicine

## 2023-06-16 ENCOUNTER — Other Ambulatory Visit: Payer: Self-pay

## 2023-06-16 ENCOUNTER — Telehealth: Payer: Self-pay

## 2023-06-16 ENCOUNTER — Other Ambulatory Visit: Payer: Self-pay | Admitting: Family Medicine

## 2023-06-16 NOTE — Patient Outreach (Signed)
Care Management   Outreach Note  06/16/2023 Name: NAZIYAH DEMELLO MRN: [id number] DOB: [birth year]  An unsuccessful telephone outreach was attempted today to contact the patient about Care Management needs.     Follow Up Plan:  A HIPAA compliant phone message was left for the patient providing contact information and requesting a return call.    [facility]  [facility], [facility] RN Care Manager Direct Dial: [phone] Website: [email or web address]

## 2023-06-16 NOTE — Telephone Encounter (Signed)
Request is too soon for refill. Last refill 09/30/22 for 18 g and 11 refills.  Requested Prescriptions  Pending Prescriptions Disp Refills   VENTOLIN HFA 108 (90 Base) MCG/ACT inhaler [Pharmacy Med Name: Ventolin HFA 108 (90 Base) MCG/ACT Inhalation Aerosol Solution] 72 g 2    Sig: USE 1 TO 2 INHALATIONS BY MOUTH  EVERY 6 HOURS AS NEEDED FOR  WHEEZING OR SHORTNESS OF BREATH     Pulmonology:  Beta Agonists 2 Failed - 06/13/2023 10:11 PM      Failed - Last BP in normal range    BP Readings from Last 1 Encounters:  04/30/23 (!) 150/64         Passed - Last Heart Rate in normal range    Pulse Readings from Last 1 Encounters:  04/30/23 84         Passed - Valid encounter within last 12 months    Recent Outpatient Visits           2 months ago Annual physical exam   [facility] Merita Norton T, FNP   4 months ago Adjustment disorder with anxious mood   [facility] Mer[facility] T, FNP   10 months ago COPD with hypoxia [facility])   [facility] Merita Norton T, FNP   1 year ago Annual physical exam   [facility] Jacky Kindle, FNP   1 year ago Change in stool   [facility] Jacky Kindle, FNP       Future Appointments             In 10 months Jacky Kindle, FNP [facility], [facility]

## 2023-06-17 ENCOUNTER — Encounter: Payer: Self-pay | Admitting: Family Medicine

## 2023-06-17 ENCOUNTER — Telehealth (INDEPENDENT_AMBULATORY_CARE_PROVIDER_SITE_OTHER): Payer: 59 | Admitting: Family Medicine

## 2023-06-17 ENCOUNTER — Other Ambulatory Visit: Payer: Self-pay

## 2023-06-17 DIAGNOSIS — F419 Anxiety disorder, unspecified: Secondary | ICD-10-CM | POA: Diagnosis not present

## 2023-06-17 DIAGNOSIS — R451 Restlessness and agitation: Secondary | ICD-10-CM | POA: Insufficient documentation

## 2023-06-17 DIAGNOSIS — F39 Unspecified mood [affective] disorder: Secondary | ICD-10-CM | POA: Insufficient documentation

## 2023-06-17 DIAGNOSIS — F5102 Adjustment insomnia: Secondary | ICD-10-CM | POA: Insufficient documentation

## 2023-06-17 MED ORDER — ALPRAZOLAM 1 MG PO TABS: 1.0000 mg | ORAL_TABLET | Freq: Three times a day (TID) | ORAL | 0 refills | Status: DC | PRN

## 2023-06-17 MED ORDER — PAROXETINE HCL 10 MG PO TABS: 10.0000 mg | ORAL_TABLET | Freq: Every day | ORAL | 0 refills | Status: DC

## 2023-06-17 NOTE — Telephone Encounter (Signed)
Refused Ventolin aerosol solution for inhalation because it's being requested too soon.

## 2023-06-17 NOTE — Patient Outreach (Signed)
Care Management   Visit Note   Name: Alexandria [name] MRN: [id number] DOB: [birth year]  Subjective: Alexandria [name] is a 67 y.o. year old female who is a primary care patient of Jacky Kindle, FNP. The Care Management team was consulted for assistance.      Engaged with patient via telephone.  Assessment:  Outpatient Encounter Medications as of 06/16/2023  Medication Sig Note   acetaminophen (TYLENOL) 500 MG tablet Take 1,000 mg by mouth every 6 (six) hours as needed for mild pain.    albuterol (VENTOLIN HFA) 108 (90 Base) MCG/ACT inhaler Inhale 1-2 puffs into the lungs every 6 (six) hours as needed for wheezing or shortness of breath.    amLODipine (NORVASC) 10 MG tablet TAKE 1 TABLET BY MOUTH DAILY    cetirizine (ZYRTEC) 10 MG tablet Take 1 tablet (10 mg total) by mouth daily. 08/29/2022: Reports taking as needed   famotidine (PEPCID) 20 MG tablet Take 20 mg by mouth as needed for heartburn.    hydrochlorothiazide (HYDRODIURIL) 25 MG tablet TAKE 1 TABLET BY MOUTH DAILY    letrozole (FEMARA) 2.5 MG tablet TAKE 1 TABLET BY MOUTH DAILY  (START 1 WEEK AFTER COMPLETION  OF RADIATION)    loperamide (IMODIUM) 2 MG capsule TAKE 1 CAPSULE BY MOUTH 4 TIMES  DAILY AS NEEDED FOR DIARRHEA OR  LOOSE STOOLS 03/12/2023: prn   metFORMIN (GLUCOPHAGE-XR) 750 MG 24 hr tablet TAKE 1 TABLET BY MOUTH DAILY  WITH BREAKFAST 08/29/2022: Reports taking in evening   ondansetron (ZOFRAN-ODT) 4 MG disintegrating tablet Take 1 tablet (4 mg total) by mouth every 8 (eight) hours as needed for nausea or vomiting.    rosuvastatin (CRESTOR) 40 MG tablet TAKE 1 TABLET BY MOUTH DAILY    Spacer/Aero-Holding Chambers (AEROCHAMBER MV) inhaler Use as instructed    TRELEGY ELLIPTA 100-62.5-25 MCG/ACT AEPB USE 1 INHALATION BY MOUTH DAILY    [DISCONTINUED] clonazePAM (KLONOPIN) 0.5 MG tablet Take 0.5 tablets (0.25 mg total) by mouth daily as needed for anxiety.    [DISCONTINUED] fluticasone (FLONASE) 50 MCG/ACT nasal spray Place 2 sprays into  both nostrils daily.    No facility-administered encounter medications on file as of 06/16/2023.    Interventions:  Goals Addressed             This Visit's Progress    Care Management       Current Barriers:  Care Management support and education needs related to Diabetes  Planned Interventions: Diabetes Reviewed provider's plan for Diabetes Management. Reviewed medications and importance of medication adherence. Reports taking metformin as prescribed.  Currently does not monitor blood glucose reading d/t A1C being within goal. Reviewed s/sx of hypoglycemia and hyperglycemia along with recommended interventions. Denies hypoglycemic or hyperglycemic episodes. Discussed nutritional intake and importance of complying with a diabetic diet. Reports adhering to diet recommendations. Reports a good appetite. Currently consuming two full meals a day with occasional snacks if needed. Reports she was advised by her provider to consume protein bars or protein supplement if needed during the day. Advised to continue reading nutrition labels, avoid foods/beverages with added sugar and avoid highly processed foods when possible. Reviewed recommended DM preventive care. Eye exam is up to date. Completed on 05/05/24. Foot exam is up to date. Reports also completing daily foot care as advised. Discussed importance of completing ordered labs as prescribed.    Lab Results  Component Value Date   HGBA1C 6.3 (H) 04/16/2023     Patient Care/Self-Goals: Take medications as  prescribed   Attend all scheduled provider appointments Call pharmacy for medication refills 3-7 days in advance of running out of medications Check feet daily for cuts, sores or redness Wash and dry feet carefully every day Wear comfortable, cotton socks Wear comfortable, well-fitting shoes Continue reading nutrition labels and adhering to recommended diabetic diet. Call provider office for new concerns or questions     ////////////////////////////////////////////////////////////////////////////////// Planned Interventions: COPD Reviewed provider's plan for COPD management.  Reports taking medication and using inhalers as prescribed. Reports symptoms have been well controlled. Continues to experience dyspnea and fatigue with moderate activity. Overall reports doing well. Denies changes or decline in activity tolerance. Reviewed importance of monitoring symptoms daily. Advised to contact a provider and make an appointment if experiencing moderate symptoms for greater than 48 hours without improvement.  Discussed triggers. Reports symptoms usually flare-up during warmer weather. Reviewed information regarding infection prevention and increased risk r/t COPD. Advised to utilize prevention strategies to reduce risk of respiratory infection. Reviewed worsening symptoms that require immediate medical attention.    Patient Care/Self-Goals: Call pharmacy for medication refills 3-7 days in advance of running out of medications Identify and remove indoor air pollutants Do breathing exercises every day Eliminate symptom triggers at home Practice relaxation or meditation daily Consider plan for smoking cessation Follow rescue plan if symptoms flare-up Call provider office for new concerns or questions   //////////////////////////////////////////////////////////////////////////  Planned Interventions: Hx of Breast Cancer  Breast Cancer Reviewed plan for follow-up r/t malignant neoplasm of the left breast.  Reports completing all required imaging and treatments. Reviewed ability to complete ADLs and care coordination needs. Remains independent with ADLs and IADLs. No changes or decline in functional status. Will follow up with surgical team/Dr. Aleen Campi on 06/23/23. Will complete 6 month follow up with Oncologist/Dr. Alena Bills on 09/01/23.  Patient Care/Self-Goals: Attend all provider appointments Update the  care management team to address care coordination concerns as needed Call provider office for new concerns or questions     PLAN Will follow up within the next month.              PLAN Will follow up within the next month.   Katina Degree [facility]  [facility], [facility] RN Care Manager Direct Dial: [phone] Website: [email or web address]

## 2023-06-17 NOTE — Progress Notes (Signed)
MyChart Video Visit  Virtual Visit via Video Note   This format is felt to be most appropriate for this patient at this time. Physical exam was limited by quality of the video and audio technology used for the visit.   Patient location: Home Provider location:  [facility] [address]  [address] [place], [place] [address]  I discussed the limitations of evaluation and management by telemedicine and the availability of in person appointments. The patient expressed understanding and agreed to proceed.  Patient: Alexandria [name]   DOB: [birth year]   67 y.o. Female  MRN: [id number] Visit Date: 06/17/2023  Today's healthcare provider: Jacky Kindle, FNP   Introduced to nurse practitioner role and practice setting.  All questions answered.  Discussed provider/patient relationship and expectations.  Subjective    HPI   The patient, with a history of breast cancer, presents with increasing anxiety and irritability. She reports that her current medication, Klonopin, is causing significant side effects including moodiness, irritability, and memory issues. She expresses a desire to change her medication due to these side effects. The patient's daughter is present during the consultation and assists with the virtual meeting setup.  Medications: Outpatient Medications Prior to Visit  Medication Sig   acetaminophen (TYLENOL) 500 MG tablet Take 1,000 mg by mouth every 6 (six) hours as needed for mild pain.   albuterol (VENTOLIN HFA) 108 (90 Base) MCG/ACT inhaler Inhale 1-2 puffs into the lungs every 6 (six) hours as needed for wheezing or shortness of breath.   amLODipine (NORVASC) 10 MG tablet TAKE 1 TABLET BY MOUTH DAILY   cetirizine (ZYRTEC) 10 MG tablet Take 1 tablet (10 mg total) by mouth daily.   clonazePAM (KLONOPIN) 0.5 MG tablet Take 0.5 tablets (0.25 mg total) by mouth daily as needed for anxiety.   famotidine (PEPCID) 20 MG tablet Take 20 mg by mouth as needed for  heartburn.   hydrochlorothiazide (HYDRODIURIL) 25 MG tablet TAKE 1 TABLET BY MOUTH DAILY   letrozole (FEMARA) 2.5 MG tablet TAKE 1 TABLET BY MOUTH DAILY  (START 1 WEEK AFTER COMPLETION  OF RADIATION)   loperamide (IMODIUM) 2 MG capsule TAKE 1 CAPSULE BY MOUTH 4 TIMES  DAILY AS NEEDED FOR DIARRHEA OR  LOOSE STOOLS   metFORMIN (GLUCOPHAGE-XR) 750 MG 24 hr tablet TAKE 1 TABLET BY MOUTH DAILY  WITH BREAKFAST   ondansetron (ZOFRAN-ODT) 4 MG disintegrating tablet Take 1 tablet (4 mg total) by mouth every 8 (eight) hours as needed for nausea or vomiting.   rosuvastatin (CRESTOR) 40 MG tablet TAKE 1 TABLET BY MOUTH DAILY   Spacer/Aero-Holding Chambers (AEROCHAMBER MV) inhaler Use as instructed   TRELEGY ELLIPTA 100-62.5-25 MCG/ACT AEPB USE 1 INHALATION BY MOUTH DAILY   No facility-administered medications prior to visit.        Objective    There were no vitals taken for this visit.      Physical Exam Constitutional:      Appearance: Normal appearance.  Pulmonary:     Effort: Pulmonary effort is normal.  Neurological:     Mental Status: She is alert.  Psychiatric:        Mood and Affect: Mood normal.        Behavior: Behavior normal.        Thought Content: Thought content normal.        Judgment: Judgment normal.       Assessment & Plan    Anxiety Patient reports increased irritability and moodiness, and has  expressed a desire to discontinue Klonopin due to concerns about long-term side effects. Patient is currently taking Klonopin inconsistently, primarily at night for sleep. -Start Paxil at bedtime to help manage mood and anxiety symptoms. -Continue Klonopin at current dose until Paxil has been in the system for a month. -Plan to transition from Klonopin to Xanax when currently supply ends; with the goal of eventually reducing and discontinuing benzodiazepine use. -Check in with patient in a month to assess response to Paxil and discuss next steps for benzodiazepine  reduction.  Medication Management Patient has expressed confusion about medication instructions and has requested clear instructions be provided in her chart. -Provide clear written instructions for new and adjusted medications in patient's chart. -Ensure patient understands plan for transitioning from Klonopin to Xanax, and the importance of consistent Paxil use.  Follow-up Schedule a virtual appointment in a month to assess response to Paxil and discuss next steps for benzodiazepine reduction.  I discussed the assessment and treatment plan with the patient. The patient was provided an opportunity to ask questions and all were answered. The patient agreed with the plan and demonstrated an understanding of the instructions.   The patient was advised to call back or seek an in-person evaluation if the symptoms worsen or if the condition fails to improve as anticipated.  I provided 20 minutes of face-to-face time during this encounter discussing mood concerns with agitation, irritability and anxiety.  Leilani Merl, FNP, have reviewed all documentation for this visit. The documentation on 06/17/23 for the exam, diagnosis, procedures, and orders are all accurate and complete.  Jacky Kindle, FNP [facility] [phone] (phone) [phone] (fax)  [facility]

## 2023-06-19 ENCOUNTER — Other Ambulatory Visit: Payer: Self-pay | Admitting: Family Medicine

## 2023-06-19 NOTE — Telephone Encounter (Signed)
Request is too soon to refill, duplicate request.  Requested Prescriptions  Pending Prescriptions Disp Refills   VENTOLIN HFA 108 (90 Base) MCG/ACT inhaler [Pharmacy Med Name: Ventolin HFA 108 (90 Base) MCG/ACT Inhalation Aerosol Solution] 72 g 2    Sig: USE 1 TO 2 INHALATIONS BY MOUTH  EVERY 6 HOURS AS NEEDED FOR  WHEEZING OR SHORTNESS OF BREATH     Pulmonology:  Beta Agonists 2 Failed - 06/19/2023  7:47 AM      Failed - Last BP in normal range    BP Readings from Last 1 Encounters:  04/30/23 (!) 150/64         Passed - Last Heart Rate in normal range    Pulse Readings from Last 1 Encounters:  04/30/23 84         Passed - Valid encounter within last 12 months    Recent Outpatient Visits           2 days ago Mood disorder [facility])   [place] [facility] Jacky Kindle, FNP   2 months ago Annual physical exam   [facility] Merita Norton T, FNP   4 months ago Adjustment disorder with anxious mood   [facility] Mer[facility] Norton T, FNP   10 months ago COPD with hypoxia [facility])   [facility] Merita Norton T, FNP   1 year ago Annual physical exam   [facility] Jacky Kindle, FNP       Future Appointments             In 3 weeks Jacky Kindle, FNP [facility], [facility]   In 10 months Jacky Kindle, FNP [facility], [facility]

## 2023-06-20 NOTE — Patient Outreach (Signed)
Care Management   Visit Note   Name: Alexandria [name] MRN: [id number] DOB: November 27, 1955  Subjective: Alexandria [name] is a 67 y.o. year old female who is a primary care patient of Jacky Kindle, FNP. The Care Management team was consulted for assistance.      Brief follow up outreach with Alexandria [name]. Expressed concerns regarding long term side effects of klonopin. Informed that virtual visit has been arranged with her primary care provider to discuss other options for treatment.    PLAN  Alexandria [name] will speak with her PCP today via virtual visit. Will follow up as scheduled for Care Management outreach on 07/09/23.   Katina [facility]  [facility], [facility] RN Care Manager Direct Dial: [phone] Website: [email or web address]

## 2023-06-23 ENCOUNTER — Other Ambulatory Visit: Payer: Self-pay | Admitting: Family Medicine

## 2023-06-23 ENCOUNTER — Encounter: Payer: Self-pay | Admitting: Surgery

## 2023-06-23 ENCOUNTER — Ambulatory Visit (INDEPENDENT_AMBULATORY_CARE_PROVIDER_SITE_OTHER): Payer: 59 | Admitting: Surgery

## 2023-06-23 VITALS — BP 150/78 | HR 72 | Temp 98.0°F | Ht 67.0 in | Wt 172.8 lb

## 2023-06-23 DIAGNOSIS — C50412 Malignant neoplasm of upper-outer quadrant of left female breast: Secondary | ICD-10-CM | POA: Diagnosis not present

## 2023-06-23 DIAGNOSIS — Z170 Estrogen receptor positive status [ER+]: Secondary | ICD-10-CM

## 2023-06-23 NOTE — Progress Notes (Signed)
06/23/2023  History of Present Illness: Alexandria [name] is a 67 y.o. female s/p left breast lumpectomy and SLNBx on 07/17/22 for left breast cancer.  Patient presents for follow up.  Denies any new symptoms or issues.  She reports still having some intermittent short-lived episodes of stabbing pain or pulling in the breast, but nothing that last long.  She has completed radiation therapy and is currently on Letrozole.  She still smokes about 1 pack per day.  Past Medical History: Past Medical History:  Diagnosis Date   Anxiety    Breast cancer (HCC)    Cancer (HCC)    Carbuncle and furuncle of trunk    COPD (chronic obstructive pulmonary disease) (HCC)    Depression    Diabetes mellitus without complication (HCC)    Dyspnea    GERD (gastroesophageal reflux disease)    HTN (hypertension)    Hyperlipidemia    Intrinsic asthma, unspecified    Methicillin resistant Staphylococcus aureus in conditions classified elsewhere and of unspecified site    Other psychological or physical stress, not elsewhere classified(V62.89)    Personal history of radiation therapy    Pneumonia    Pre-diabetes    Tobacco abuse      Past Surgical History: Past Surgical History:  Procedure Laterality Date   BREAST BIOPSY Left 06/26/2022   stereo biopsy/ coil clip/ positive   BREAST LUMPECTOMY Left 07/17/2022   BREAST LUMPECTOMY,RADIO FREQ LOCALIZER,AXILLARY SENTINEL LYMPH NODE BIOPSY Left 07/17/2022   Procedure: BREAST LUMPECTOMY,RADIO FREQ LOCALIZER,AXILLARY SENTINEL LYMPH NODE BIOPSY;  Surgeon: Henrene Dodge, MD;  Location: [facility];  Service: General;  Laterality: Left;   COLONOSCOPY WITH PROPOFOL N/A 04/10/2015   Procedure: COLONOSCOPY WITH PROPOFOL;  Surgeon: Midge Minium, MD;  Location: [facility];  Service: Endoscopy;  Laterality: N/A;   COLONOSCOPY WITH PROPOFOL N/A 06/25/2021   Procedure: COLONOSCOPY WITH PROPOFOL;  Surgeon: Midge Minium, MD;  Location: [facility];  Service:  Endoscopy;  Laterality: N/A;  leave at 8 arrival   ESOPHAGOGASTRODUODENOSCOPY (EGD) WITH PROPOFOL N/A 12/06/2021   Procedure: ESOPHAGOGASTRODUODENOSCOPY (EGD) WITH PROPOFOL;  Surgeon: Midge Minium, MD;  Location: [facility];  Service: Endoscopy;  Laterality: N/A;   POLYPECTOMY  04/10/2015   Procedure: POLYPECTOMY;  Surgeon: Midge Minium, MD;  Location: [facility];  Service: Endoscopy;;   POLYPECTOMY N/A 06/25/2021   Procedure: POLYPECTOMY;  Surgeon: Midge Minium, MD;  Location: [facility];  Service: Endoscopy;  Laterality: N/A;   STATUS POST ENDOMETRIAL ABLATION     TONSILLECTOMY     TUBAL LIGATION     bilateral, sterile ablation   VARICOSE VEINS      Home Medications: Prior to Admission medications   Medication Sig Start Date End Date Taking? Authorizing Provider  acetaminophen (TYLENOL) 500 MG tablet Take 1,000 mg by mouth every 6 (six) hours as needed for mild pain.   Yes [provider]  albuterol (VENTOLIN HFA) 108 (90 Base) MCG/ACT inhaler Inhale 1-2 puffs into the lungs every 6 (six) hours as needed for wheezing or shortness of breath. 09/30/22  Yes Jacky Kindle, FNP  ALPRAZolam Prudy Feeler) 1 MG tablet Take 1 tablet (1 mg total) by mouth 3 (three) times daily as needed for anxiety or sleep. Take following completion of klonopin to assist with wean off benzo. Start DAILY Paxil at bedtime to assist. 4 week f/u needed. 06/17/23  Yes Merita Norton T, FNP  amLODipine (NORVASC) 10 MG tablet TAKE 1 TABLET BY MOUTH DAILY 05/06/23  Yes Suzie Portela,  Daryl Eastern, FNP  cetirizine (ZYRTEC) 10 MG tablet Take 1 tablet (10 mg total) by mouth daily. 01/13/20  Yes Margaretann Loveless, PA-C  famotidine (PEPCID) 20 MG tablet Take 20 mg by mouth as needed for heartburn.   Yes [provider]  hydrochlorothiazide (HYDRODIURIL) 25 MG tablet TAKE 1 TABLET BY MOUTH DAILY 05/06/23  Yes Merita Norton T, FNP  letrozole (FEMARA) 2.5 MG tablet TAKE 1 TABLET BY MOUTH DAILY  (START 1 WEEK  AFTER COMPLETION  OF RADIATION) 10/09/22  Yes Alinda Dooms, NP  metFORMIN (GLUCOPHAGE-XR) 750 MG 24 hr tablet TAKE 1 TABLET BY MOUTH DAILY  WITH BREAKFAST Patient taking differently: Take 750 mg by mouth at bedtime. 08/09/22  Yes Merita Norton T, FNP  PARoxetine (PAXIL) 10 MG tablet Take 1 tablet (10 mg total) by mouth daily. 06/17/23  Yes Merita Norton T, FNP  rosuvastatin (CRESTOR) 40 MG tablet TAKE 1 TABLET BY MOUTH DAILY 06/10/23  Yes Jacky Kindle, FNP  Spacer/Aero-Holding Chambers (AEROCHAMBER MV) inhaler Use as instructed 07/24/22  Yes Domenick Gong, MD  TRELEGY ELLIPTA 100-62.5-25 MCG/ACT AEPB USE 1 INHALATION BY MOUTH DAILY 02/17/23  Yes Jacky Kindle, FNP  fluticasone (FLONASE) 50 MCG/ACT nasal spray Place 2 sprays into both nostrils daily. 12/09/19 01/06/20  Margaretann Loveless, PA-C    Allergies: Allergies  Allergen Reactions   Pravastatin Sodium     Myalgia    Review of Systems: Review of Systems  Constitutional:  Negative for chills and fever.  Respiratory:  Negative for shortness of breath.   Cardiovascular:  Negative for chest pain.  Gastrointestinal:  Negative for nausea and vomiting.  Skin:  Negative for rash.    Physical Exam BP (!) 150/78   Pulse 72   Temp 98 F (36.7 C)   Ht 5\' 7"  (1.702 m)   Wt 172 lb 12.8 oz (78.4 kg)   SpO2 95%   BMI 27.06 kg/m  CONSTITUTIONAL: No acute distress, well nourished. HEENT:  Normocephalic, atraumatic, extraocular motion intact. RESPIRATORY:  Normal respiratory effort without pathologic use of accessory muscles. CARDIOVASCULAR: Regular rhythm and rate. BREAST:  Left breast s/p lumpectomy with scar well healed.  There are still radiation changes to the skin and nipple but otherwise no palpable masses.  No left axillary lymphadenopathy and left axillary incision is well healed.  Right breast without any palpable masses, skin changes, or nipple changes.  No right axillary lymphadenopathy. NEUROLOGIC:  Motor and sensation is  grossly normal.  Cranial nerves are grossly intact. PSYCH:  Alert and oriented to person, place and time. Affect is normal.  Labs/Imaging: Mammogram on 06/09/23: FINDINGS: Postoperative changes are noted in the upper outer left breast at posterior depth. Diffuse skin and trabecular thickening throughout the left breast, consistent with postradiation treatment changes. No new suspicious mass, calcification or other finding in either breast.   IMPRESSION: Expected postoperative and postradiation treatment changes in the left breast. No mammographic findings of malignancy in either breast.   RECOMMENDATION: Bilateral diagnostic mammogram in 1 year.   I have discussed the findings and recommendations with the patient and her daughter. If applicable, a reminder letter will be sent to the patient regarding the next appointment.   BI-RADS CATEGORY  2: Benign.  Assessment and Plan: This is a 67 y.o. female s/p left breast lumpectomy and SLNBx.  --Discussed with the patient the results of her mammogram showing no suspicious findings.  Her exam today is also reassuring.  She still has radiation changes to  her left breast skin and nipple area.  Recommended that she try moisturizing lotion or cocoa butter lotion or vitamin E lotion to help.  The intermittent pain may improve with time, but she may also have some residual degree based on scar tissue. --Recommended that she cut down and eventually quit smoking. --Follow up in 6 months.  I spent 20 minutes dedicated to the care of this patient on the date of this encounter to include pre-visit review of records, face-to-face time with the patient discussing diagnosis and management, and any post-visit coordination of care.   Howie Ill, MD [facility]

## 2023-06-23 NOTE — Patient Instructions (Addendum)
May use Cocoa butter or other moisturized on dry areas.  We will see you back here in 6 months for a follow up exam. We will send a letter about this appointment.   We will send you a letter about these appointments.   We do encourage you to try to reduce your smoking.    Managing the Challenge of Quitting Smoking Quitting smoking is a physical and mental challenge. You may have cravings, withdrawal symptoms, and temptation to smoke. Before quitting, work with your health care provider to make a plan that can help you manage quitting. Making a plan before you quit may keep you from smoking when you have the urge to smoke while trying to quit. How to manage lifestyle changes Managing stress Stress can make you want to smoke, and wanting to smoke may cause stress. It is important to find ways to manage your stress. You could try some of the following: Practice relaxation techniques. Breathe slowly and deeply, in through your nose and out through your mouth. Listen to music. Soak in a bath or take a shower. Imagine a peaceful place or vacation. Get some support. Talk with family or friends about your stress. Join a support group. Talk with a counselor or therapist. Get some physical activity. Go for a walk, run, or bike ride. Play a favorite sport. Practice yoga.  Medicines Talk with your health care provider about medicines that might help you deal with cravings and make quitting easier for you. Relationships Social situations can be difficult when you are quitting smoking. To manage this, you can: Avoid parties and other social situations where people might be smoking. Avoid alcohol. Leave right away if you have the urge to smoke. Explain to your family and friends that you are quitting smoking. Ask for support and let them know you might be a bit grumpy. Plan activities where smoking is not an option. General instructions Be aware that many people gain weight after they quit  smoking. However, not everyone does. To keep from gaining weight, have a plan in place before you quit, and stick to the plan after you quit. Your plan should include: Eating healthy snacks. When you have a craving, it may help to: Eat popcorn, or try carrots, celery, or other cut vegetables. Chew sugar-free gum. Changing how you eat. Eat small portion sizes at meals. Eat 4-6 small meals throughout the day instead of 1-2 large meals a day. Be mindful when you eat. You should avoid watching television or doing other things that might distract you as you eat. Exercising regularly. Make time to exercise each day. If you do not have time for a long workout, do short bouts of exercise for 5-10 minutes several times a day. Do some form of strengthening exercise, such as weight lifting. Do some exercise that gets your heart beating and causes you to breathe deeply, such as walking fast, running, swimming, or biking. This is very important. Drinking plenty of water or other low-calorie or no-calorie drinks. Drink enough fluid to keep your urine pale yellow.  How to recognize withdrawal symptoms Your body and mind may experience discomfort as you try to get used to not having nicotine in your system. These effects are called withdrawal symptoms. They may include: Feeling hungrier than normal. Having trouble concentrating. Feeling irritable or restless. Having trouble sleeping. Feeling depressed. Craving a cigarette. These symptoms may surprise you, but they are normal to have when quitting smoking. To manage withdrawal symptoms: Avoid places, people,  and activities that trigger your cravings. Remember why you want to quit. Get plenty of sleep. Avoid coffee and other drinks that contain caffeine. These may worsen some of your symptoms. How to manage cravings Come up with a plan for how to deal with your cravings. The plan should include the following: A definition of the specific situation you  want to deal with. An activity or action you will take to replace smoking. A clear idea for how this action will help. The name of someone who could help you with this. Cravings usually last for 5-10 minutes. Consider taking the following actions to help you with your plan to deal with cravings: Keep your mouth busy. Chew sugar-free gum. Suck on hard candies or a straw. Brush your teeth. Keep your hands and body busy. Change to a different activity right away. Squeeze or play with a ball. Do an activity or a hobby, such as making bead jewelry, practicing needlepoint, or working with wood. Mix up your normal routine. Take a short exercise break. Go for a quick walk, or run up and down stairs. Focus on doing something kind or helpful for someone else. Call a friend or family member to talk during a craving. Join a support group. Contact a quitline. Where to find support To get help or find a support group: Call the National Cancer Institute's Smoking Quitline: 1-800-QUIT-NOW (574)837-2619) Text QUIT to SmokefreeTXT: 147829 Where to find more information Visit these websites to find more information on quitting smoking: U.S. Department of Health and Human Services: www.smokefree.gov American Lung Association: www.freedomfromsmoking.org Centers for Disease Control and Prevention (CDC): FootballExhibition.com.br American Heart Association: www.heart.org Contact a health care provider if: You want to change your plan for quitting. The medicines you are taking are not helping. Your eating feels out of control or you cannot sleep. You feel depressed or become very anxious. Summary Quitting smoking is a physical and mental challenge. You will face cravings, withdrawal symptoms, and temptation to smoke again. Preparation can help you as you go through these challenges. Try different techniques to manage stress, handle social situations, and prevent weight gain. You can deal with cravings by keeping your  mouth busy (such as by chewing gum), keeping your hands and body busy, calling family or friends, or contacting a quitline for people who want to quit smoking. You can deal with withdrawal symptoms by avoiding places where people smoke, getting plenty of rest, and avoiding drinks that contain caffeine. This information is not intended to replace advice given to you by your health care provider. Make sure you discuss any questions you have with your health care provider. Document Revised: 08/10/2021 Document Reviewed: 08/10/2021 Elsevier Patient Education  2024 [employer].

## 2023-06-23 NOTE — Telephone Encounter (Unsigned)
Copied from CRM [id number]. Topic: General - Other >> Jun 23, 2023  3:33 PM Everette C wrote: Reason for CRM: Medication Refill - Medication: albuterol (VENTOLIN HFA) 108 (90 Base) MCG/ACT inhaler [[id number]]  Has the patient contacted their pharmacy? Yes.   (Agent: If no, request that the patient contact the pharmacy for the refill. If patient does not wish to contact the pharmacy document the reason why and proceed with request.) (Agent: If yes, when and what did the pharmacy advise?)  Preferred Pharmacy (with phone number or street name): [facility] - [place], [place] - [address] [place] [address] Phone: [phone] Fax: [phone] Hours: Not open 24 hours   Has the patient been seen for an appointment in the last year OR does the patient have an upcoming appointment? Yes.    Agent: Please be advised that RX refills may take up to 3 business days. We ask that you follow-up with your pharmacy.

## 2023-06-24 ENCOUNTER — Other Ambulatory Visit: Payer: Self-pay | Admitting: Family Medicine

## 2023-06-24 MED ORDER — ALBUTEROL SULFATE HFA 108 (90 BASE) MCG/ACT IN AERS: 1.0000 | INHALATION_SPRAY | Freq: Four times a day (QID) | RESPIRATORY_TRACT | 11 refills | Status: DC | PRN

## 2023-06-24 NOTE — Telephone Encounter (Signed)
Requested Prescriptions  Refused Prescriptions Disp Refills   albuterol (VENTOLIN HFA) 108 (90 Base) MCG/ACT inhaler 18 g 11    Sig: Inhale 1-2 puffs into the lungs every 6 (six) hours as needed for wheezing or shortness of breath.     Pulmonology:  Beta Agonists 2 Failed - 06/23/2023  4:57 PM      Failed - Last BP in normal range    BP Readings from Last 1 Encounters:  06/23/23 (!) 150/78         Passed - Last Heart Rate in normal range    Pulse Readings from Last 1 Encounters:  06/23/23 72         Passed - Valid encounter within last 12 months    Recent Outpatient Visits           1 week ago Mood disorder [facility])   [facility] Jacky Kindle, FNP   2 months ago Annual physical exam   [facility] Merita Norton T, FNP   5 months ago Adjustment disorder with anxious mood   [facility] Merita Norton T, FNP   10 months ago COPD with hypoxia [facility])   [facility] Jacky Kindle, FNP   1 year ago Annual physical exam   [facility] Jacky Kindle, FNP       Future Appointments             In 3 weeks Jacky Kindle, FNP [facility]   In 9 months Suzie Portela, Daryl Eastern, FNP [facility], [facility]

## 2023-07-09 ENCOUNTER — Telehealth: Payer: Self-pay

## 2023-07-09 ENCOUNTER — Other Ambulatory Visit: Payer: Self-pay

## 2023-07-09 NOTE — Patient Outreach (Signed)
  Care Management   Outreach Note  07/09/2023 Name: Alexandria [name] MRN: [id number] DOB: Aug 23, 1956  An unsuccessful outreach attempt was made today for a scheduled Care Management visit.   Follow Up Plan:  Phone rang multiple times without option to leave a voice message. Will make additional outreach attempts.  Katina [facility]  [facility], [facility] RN Care Manager Direct Dial: [phone] Website: [email or web address]

## 2023-07-14 ENCOUNTER — Telehealth: Payer: Self-pay | Admitting: *Deleted

## 2023-07-14 NOTE — Progress Notes (Signed)
  Care Coordination Note  07/14/2023 Name: [name] MRN: [id number] DOB: Apr 13, 1956  [name] is a 67 y.o. year old female who is a primary care patient of Jacky Kindle, FNP and is actively engaged with the care management team. I reached out to Nelly Rout Ding by phone today to assist with re-scheduling a follow up visit with the RN Case Manager  Follow up plan: Unsuccessful telephone outreach attempt made. A HIPAA compliant phone message was left for the patient providing contact information and requesting a return call.   Burman Nieves, CCMA Care Coordination Care Guide Direct Dial: [phone]

## 2023-07-14 NOTE — Progress Notes (Signed)
  Care Coordination Note  07/14/2023 Name: MARYUM SLAVEY MRN: [id number] DOB: [birth year]  Nelly Rout Polansky is a 67 y.o. year old female who is a primary care patient of Jacky Kindle, FNP and is actively engaged with the care management team. I reached out to Nelly Rout Whitelock by phone today to assist with re-scheduling a follow up visit with the RN Case Manager  Follow up plan: Telephone appointment with care management team member scheduled for: 07/24/2023  Burman Nieves, [facility] Care Coordination Care Guide Direct Dial: [phone]

## 2023-07-15 ENCOUNTER — Telehealth (INDEPENDENT_AMBULATORY_CARE_PROVIDER_SITE_OTHER): Payer: 59 | Admitting: Family Medicine

## 2023-07-15 ENCOUNTER — Other Ambulatory Visit: Payer: Self-pay | Admitting: Family Medicine

## 2023-07-15 ENCOUNTER — Encounter: Payer: Self-pay | Admitting: Family Medicine

## 2023-07-15 DIAGNOSIS — F39 Unspecified mood [affective] disorder: Secondary | ICD-10-CM | POA: Diagnosis not present

## 2023-07-15 DIAGNOSIS — F5104 Psychophysiologic insomnia: Secondary | ICD-10-CM | POA: Insufficient documentation

## 2023-07-15 DIAGNOSIS — I152 Hypertension secondary to endocrine disorders: Secondary | ICD-10-CM

## 2023-07-15 MED ORDER — ALPRAZOLAM 0.25 MG PO TABS: 0.2500 mg | ORAL_TABLET | Freq: Two times a day (BID) | ORAL | 0 refills | Status: DC | PRN

## 2023-07-15 MED ORDER — PAROXETINE HCL 10 MG PO TABS: 10.0000 mg | ORAL_TABLET | Freq: Every day | ORAL | 3 refills | Status: DC

## 2023-07-15 NOTE — Progress Notes (Signed)
MyChart Video Visit    Virtual Visit via Video Note   This format is felt to be most appropriate for this patient at this time. Physical exam was limited by quality of the video and audio technology used for the visit.   Patient location: home, couch  Provider location:  [facility] [address]  [address] [place], [place] [address]  I discussed the limitations of evaluation and management by telemedicine and the availability of in person appointments. The patient expressed understanding and agreed to proceed.  Patient: Alexandria [name]   DOB: 1955-12-31   67 y.o. Female  MRN: [id number] Visit Date: 07/15/2023  Today's healthcare provider: Jacky Kindle, FNP  Re Introduced to nurse practitioner role and practice setting.  All questions answered.  Discussed provider/patient relationship and expectations  Subjective    HPI HPI   Pt stated ----pt taking only 2 xanax--causing sleepy Last edited by Shelly Bombard, CMA on 07/15/2023 10:42 AM.      Medications: Outpatient Medications Prior to Visit  Medication Sig   acetaminophen (TYLENOL) 500 MG tablet Take 1,000 mg by mouth every 6 (six) hours as needed for mild pain.   albuterol (VENTOLIN HFA) 108 (90 Base) MCG/ACT inhaler Inhale 1-2 puffs into the lungs every 6 (six) hours as needed for wheezing or shortness of breath.   ALPRAZolam (XANAX) 1 MG tablet Take 1 tablet (1 mg total) by mouth 3 (three) times daily as needed for anxiety or sleep. Take following completion of klonopin to assist with wean off benzo. Start DAILY Paxil at bedtime to assist. 4 week f/u needed.   amLODipine (NORVASC) 10 MG tablet TAKE 1 TABLET BY MOUTH DAILY   cetirizine (ZYRTEC) 10 MG tablet Take 1 tablet (10 mg total) by mouth daily.   famotidine (PEPCID) 20 MG tablet Take 20 mg by mouth as needed for heartburn.   hydrochlorothiazide (HYDRODIURIL) 25 MG tablet TAKE 1 TABLET BY MOUTH DAILY   letrozole (FEMARA) 2.5 MG tablet TAKE 1 TABLET BY  MOUTH DAILY  (START 1 WEEK AFTER COMPLETION  OF RADIATION)   metFORMIN (GLUCOPHAGE-XR) 750 MG 24 hr tablet TAKE 1 TABLET BY MOUTH DAILY  WITH BREAKFAST (Patient taking differently: Take 750 mg by mouth at bedtime.)   PARoxetine (PAXIL) 10 MG tablet Take 1 tablet (10 mg total) by mouth daily.   rosuvastatin (CRESTOR) 40 MG tablet TAKE 1 TABLET BY MOUTH DAILY   Spacer/Aero-Holding Chambers (AEROCHAMBER MV) inhaler Use as instructed   TRELEGY ELLIPTA 100-62.5-25 MCG/ACT AEPB USE 1 INHALATION BY MOUTH DAILY   No facility-administered medications prior to visit.    Objective    There were no vitals taken for this visit.  Physical Exam Constitutional:      Appearance: Normal appearance.  Pulmonary:     Effort: Pulmonary effort is normal.     Comments: Able to speak in complete sentences. Neurological:     Mental Status: She is alert and oriented to person, place, and time.  Psychiatric:        Mood and Affect: Mood normal.        Behavior: Behavior normal.        Thought Content: Thought content normal.        Judgment: Judgment normal.      Assessment & Plan     Problem List Items Addressed This Visit   None     07/15/2023   10:42 AM 04/16/2023    9:48 AM 03/12/2023  9:33 AM  PHQ9 SCORE ONLY  PHQ-9 Total Score 3 1 0      07/15/2023   10:44 AM 01/23/2023    9:38 AM  GAD 7 : Generalized Anxiety Score  Nervous, Anxious, on Edge 0 1  Control/stop worrying 1 0  Worry too much - different things 1 0  Trouble relaxing 0 1  Restless 1 1  Easily annoyed or irritable 1 0  Afraid - awful might happen 0 0  Total GAD 7 Score 4 3  Anxiety Difficulty Not difficult at all Not difficult at all   No follow-ups on file.    I discussed the assessment and treatment plan with the patient. The patient was provided an opportunity to ask questions and all were answered. The patient agreed with the plan and demonstrated an understanding of the instructions.   The patient was advised to  call back or seek an in-person evaluation if the symptoms worsen or if the condition fails to improve as anticipated.  I provided 10 minutes of non-face-to-face time during this encounter.  Leilani Merl, FNP, have reviewed all documentation for this visit. The documentation on 07/15/23 for the exam, diagnosis, procedures, and orders are all accurate and complete.   Jacky Kindle, FNP [facility] [phone] (phone) [phone] (fax)  [facility]

## 2023-07-15 NOTE — Assessment & Plan Note (Signed)
Chronic, improved Continues on paxil 10 mg at bedtime to assist PHQ and GAD reviewed Reports some drowsiness with PRN xanax from change from klonopin; will continue to wean and reduce medication to patient goal of discontinuation

## 2023-07-24 ENCOUNTER — Other Ambulatory Visit: Payer: Self-pay

## 2023-07-31 ENCOUNTER — Other Ambulatory Visit: Payer: Self-pay | Admitting: Nurse Practitioner

## 2023-07-31 DIAGNOSIS — Z5181 Encounter for therapeutic drug level monitoring: Secondary | ICD-10-CM

## 2023-07-31 DIAGNOSIS — C50412 Malignant neoplasm of upper-outer quadrant of left female breast: Secondary | ICD-10-CM

## 2023-08-22 ENCOUNTER — Other Ambulatory Visit: Payer: Self-pay | Admitting: Family Medicine

## 2023-08-25 NOTE — Telephone Encounter (Signed)
Requested Prescriptions  Pending Prescriptions Disp Refills   metFORMIN (GLUCOPHAGE-XR) 750 MG 24 hr tablet [Pharmacy Med Name: metFORMIN HCl ER 750 MG Oral Tablet Extended Release 24 Hour] 100 tablet 1    Sig: TAKE 1 TABLET BY MOUTH DAILY  WITH BREAKFAST     Endocrinology:  Diabetes - Biguanides Failed - 08/25/2023 12:34 PM      Failed - B12 Level in normal range and within 720 days    No results found for: "VITAMINB12"       Passed - Cr in normal range and within 360 days    Creatinine  Date Value Ref Range Status  12/26/2014 0.53 mg/dL Final    Comment:    3.08-6.57 NOTE: New Reference Range  11/08/14    Creatinine, Ser  Date Value Ref Range Status  04/16/2023 0.79 0.57 - 1.00 mg/dL Final         Passed - HBA1C is between 0 and 7.9 and within 180 days    Hgb A1c MFr Bld  Date Value Ref Range Status  04/16/2023 6.3 (H) 4.8 - 5.6 % Final    Comment:             Prediabetes: 5.7 - 6.4          Diabetes: >6.4          Glycemic control for adults with diabetes: <7.0          Passed - eGFR in normal range and within 360 days    EGFR (African American)  Date Value Ref Range Status  12/26/2014 >60  Final   GFR calc Af Amer  Date Value Ref Range Status  04/03/2020 97 >59 mL/min/1.73 Final    Comment:    **[facility] currently reports eGFR in compliance with the current**   recommendations of the [employer]. [facility] will   update reporting as new guidelines are published from the NKF-ASN   Task force.    EGFR (Non-African Amer.)  Date Value Ref Range Status  12/26/2014 >60  Final    Comment:    eGFR values <22mL/min/1.73 m2 may be an indication of chronic kidney disease (CKD). Calculated eGFR is useful in patients with stable renal function. The eGFR calculation will not be reliable in acutely ill patients when serum creatinine is changing rapidly. It is not useful in patients on dialysis. The eGFR calculation may not be applicable to patients at the  low and high extremes of body sizes, pregnant women, and vegetarians.    GFR, Estimated  Date Value Ref Range Status  02/27/2023 >60 >60 mL/min Final    Comment:    (NOTE) Calculated using the CKD-EPI Creatinine Equation (2021)    eGFR  Date Value Ref Range Status  04/16/2023 81 >59 mL/min/1.73 Final         Passed - Valid encounter within last 6 months    Recent Outpatient Visits           1 month ago Mood disorder [facility])   [place] Merita Norton T, FNP   2 months ago Mood disorder [facility])   [facility] Jacky Kindle, FNP   4 months ago Annual physical exam   [facility] Merita Norton T, FNP   7 months ago Adjustment disorder with anxious mood   [facility] Merita Norton T, FNP   1 year ago COPD with hypoxia [facility])   [facility]  Jacky Kindle, FNP       Future Appointments             In 7 months Jacky Kindle, FNP [facility]            Passed - CBC within normal limits and completed in the last 12 months    WBC  Date Value Ref Range Status  04/16/2023 8.8 3.4 - 10.8 x10E3/uL Final  02/27/2023 6.7 4.0 - 10.5 K/uL Final   RBC  Date Value Ref Range Status  04/16/2023 5.17 3.77 - 5.28 x10E6/uL Final  02/27/2023 5.07 3.87 - 5.11 MIL/uL Final   Hemoglobin  Date Value Ref Range Status  04/16/2023 15.2 11.1 - 15.9 g/dL Final   Hematocrit  Date Value Ref Range Status  04/16/2023 45.2 34.0 - 46.6 % Final   MCHC  Date Value Ref Range Status  04/16/2023 33.6 31.5 - 35.7 g/dL Final  29/56/2130 86.5 30.0 - 36.0 g/dL Final   [facility]  Date Value Ref Range Status  04/16/2023 29.4 26.6 - 33.0 pg Final  02/27/2023 29.2 26.0 - 34.0 pg Final   MCV  Date Value Ref Range Status  04/16/2023 87 79 - 97 fL Final  12/25/2014 88 80 - 100 fL Final   No results found for: "PLTCOUNTKUC", "LABPLAT",  "POCPLA" RDW  Date Value Ref Range Status  04/16/2023 13.8 11.7 - 15.4 % Final  12/25/2014 13.8 11.5 - 14.5 % Final

## 2023-09-01 ENCOUNTER — Encounter: Payer: Self-pay | Admitting: Internal Medicine

## 2023-09-01 ENCOUNTER — Inpatient Hospital Stay (HOSPITAL_BASED_OUTPATIENT_CLINIC_OR_DEPARTMENT_OTHER): Payer: 59 | Admitting: Internal Medicine

## 2023-09-01 ENCOUNTER — Inpatient Hospital Stay: Payer: 59 | Attending: Internal Medicine

## 2023-09-01 VITALS — BP 114/64 | HR 86 | Temp 97.6°F | Ht 67.0 in | Wt 169.0 lb

## 2023-09-01 DIAGNOSIS — C50812 Malignant neoplasm of overlapping sites of left female breast: Secondary | ICD-10-CM | POA: Diagnosis not present

## 2023-09-01 DIAGNOSIS — Z79811 Long term (current) use of aromatase inhibitors: Secondary | ICD-10-CM | POA: Diagnosis not present

## 2023-09-01 DIAGNOSIS — F17210 Nicotine dependence, cigarettes, uncomplicated: Secondary | ICD-10-CM | POA: Diagnosis not present

## 2023-09-01 DIAGNOSIS — Z170 Estrogen receptor positive status [ER+]: Secondary | ICD-10-CM | POA: Insufficient documentation

## 2023-09-01 DIAGNOSIS — Z79899 Other long term (current) drug therapy: Secondary | ICD-10-CM | POA: Diagnosis not present

## 2023-09-01 DIAGNOSIS — Z923 Personal history of irradiation: Secondary | ICD-10-CM | POA: Diagnosis not present

## 2023-09-01 DIAGNOSIS — C50412 Malignant neoplasm of upper-outer quadrant of left female breast: Secondary | ICD-10-CM

## 2023-09-01 LAB — CBC WITH DIFFERENTIAL (CANCER CENTER ONLY)
Abs Immature Granulocytes: 0.02 10*3/uL (ref 0.00–0.07)
Basophils Absolute: 0.1 10*3/uL (ref 0.0–0.1)
Basophils Relative: 1 %
Eosinophils Absolute: 0.2 10*3/uL (ref 0.0–0.5)
Eosinophils Relative: 2 %
HCT: 43.1 % (ref 36.0–46.0)
Hemoglobin: 14.8 g/dL (ref 12.0–15.0)
Immature Granulocytes: 0 %
Lymphocytes Relative: 34 %
Lymphs Abs: 2.6 10*3/uL (ref 0.7–4.0)
MCH: 29.4 pg (ref 26.0–34.0)
MCHC: 34.3 g/dL (ref 30.0–36.0)
MCV: 85.5 fL (ref 80.0–100.0)
Monocytes Absolute: 0.4 10*3/uL (ref 0.1–1.0)
Monocytes Relative: 6 %
Neutro Abs: 4.4 10*3/uL (ref 1.7–7.7)
Neutrophils Relative %: 57 %
Platelet Count: 271 10*3/uL (ref 150–400)
RBC: 5.04 MIL/uL (ref 3.87–5.11)
RDW: 13.2 % (ref 11.5–15.5)
WBC Count: 7.7 10*3/uL (ref 4.0–10.5)
nRBC: 0 % (ref 0.0–0.2)

## 2023-09-01 LAB — CMP (CANCER CENTER ONLY)
ALT: 15 U/L (ref 0–44)
AST: 18 U/L (ref 15–41)
Albumin: 4.4 g/dL (ref 3.5–5.0)
Alkaline Phosphatase: 75 U/L (ref 38–126)
Anion gap: 10 (ref 5–15)
BUN: 10 mg/dL (ref 8–23)
CO2: 29 mmol/L (ref 22–32)
Calcium: 10 mg/dL (ref 8.9–10.3)
Chloride: 95 mmol/L — ABNORMAL LOW (ref 98–111)
Creatinine: 0.69 mg/dL (ref 0.44–1.00)
GFR, Estimated: >60 mL/min (ref >60)
Glucose, Bld: 97 mg/dL (ref 70–99)
Potassium: 3.3 mmol/L — ABNORMAL LOW (ref 3.5–5.1)
Sodium: 134 mmol/L — ABNORMAL LOW (ref 135–145)
Total Bilirubin: 0.6 mg/dL (ref <1.2)
Total Protein: 7 g/dL (ref 6.5–8.1)

## 2023-09-01 MED ORDER — POTASSIUM CHLORIDE CRYS ER 20 MEQ PO TBCR: 20.0000 meq | EXTENDED_RELEASE_TABLET | Freq: Every day | ORAL | 0 refills | Status: DC

## 2023-09-01 NOTE — Progress Notes (Signed)
[facility] CONSULT NOTE  Patient Care Team: Jacky Kindle, FNP as PCP - General (Family Medicine) Hulen Luster, RN as Oncology Nurse Navigator [name], MD as Consulting Physician (Radiation Oncology) Henrene Dodge, MD as Consulting Physician (General Surgery) Michaelyn Barter, MD as Consulting Physician (Oncology) Juanell Fairly, RN as Case Manager   CANCER STAGING   Cancer Staging  Breast cancer, left [facility]) Staging form: Breast, AJCC 8th Edition - Clinical stage from 06/26/2022: Stage IA (cT1b, cN0, cM0, G2, ER+, PR-, HER2-) - Signed by Michaelyn Barter, MD on 07/01/2022 Stage prefix: Initial diagnosis Method of lymph node assessment: Clinical Nuclear grade: G2 Mitotic count score: Score 1 Histologic grading system: 3 grade system   ASSESSMENT & PLAN:  Alexandria [name] 67 y.o. 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  #Left breast IDC, ER+ PR- HER2-, Stage 1A -Detected on screening mammogram done on 05/15/2022. Biopsy specimen showed 5mm invasive mammary cancer with grade 2.   - s/p lumpectomy with SLNB by Dr. Aleen Campi on 07/17/2022. Surgical pathology showed single focus of invasive carcinoma, size 5 mm, overall grade 2, DCIS present intermediate grade, margins negative, 0/3 lymph nodes negative. Oncotype DX showed recurrence score of 18. No benefit from chemotherapy.   - Completed RT to left breast on 09/26/22.   -Started on letrozole 2.5 mg on 10/03/2022.  Overall tolerating well.  Has hot flashes primarily at night which are very transient and manageable.  Plan for endocrine therapy for 5 years   - DEXA scan done on 09/16/22 was normal. Repeat in 2 years.   -Diagnostic bilateral mammogram on 06/09/2023 was negative.  Repeat in October 2025.  # Bone health-DEXA scan from 09/16/2022 was  normal.  Repeat due in January 2026. -Advised to decrease calcium vitamin D supplements to 1 tablet to every other day.  Calcium was elevated in the past.  #Genetics - declined.   #HLD- on rosuvastatin   #HTN - on amlodipine, HCTZ  #DM - on metformin   Orders Placed This Encounter  Procedures   CBC with Differential/Platelet    Standing Status:   Future    Expected Date:   03/01/2024    Expiration Date:   08/31/2024   CMP (Cancer Center only)    Standing Status:   Future    Expected Date:   03/01/2024    Expiration Date:   08/31/2024   RTC in 6 months for MD visit, labs  The total time spent in the appointment was 30 minutes encounter with patients including review of chart and various tests results, discussions about plan of care and coordination of care plan   All questions were answered. The patient knows to call the clinic with any problems, questions or concerns. No barriers to learning was detected.  Michaelyn Barter, MD 12/30/202412:40 PM   HISTORY OF PRESENTING ILLNESS:  Alexandria [name] 67 y.o. 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  INTERVAL HISTORY-  Patient seen today as follow up for letrozole toxicity check.  Overall she is tolerating letrozole well.  She does have hot flashes primarily at nighttime which is manageable.  Denies any aches and pains in joints and muscles. Does report shortness  of breath at baseline.  Motivated for smoking cessation.   I have reviewed her chart and materials related to her cancer extensively and collaborated history with the patient. Summary of oncologic history is as follows: Oncology History  Breast cancer, left (HCC)  05/15/2022 Mammogram   Screening mammogram-  FINDINGS: In the left breast, a possible asymmetry and distortion with calcifications warrant  further evaluation. In the right breast, no findings suspicious for malignancy.   IMPRESSION: Further evaluation is suggested for possible asymmetry and distortion with calcifications in the left breast.     06/06/2022 Mammogram   Diagnostic mammogram and US IMPRESSION: 1. Left breast architectural distortion in the upper-outer quadrant posterior depth, without a sonographic correlate, is intermediate suspicion for malignancy. 2. Previously described asymmetry in the inner left breast does not persist with additional views, consistent with superimposed fibroglandular tissue. 3. Incidentally visualized 3 mm benign simple cyst in the left breast 6 o'clock position.   RECOMMENDATION: Left breast stereotactic guided biopsy of the architectural distortion in the upper-outer quadrant (1 site).  [place] left axilla normal    06/26/2022 Pathology Results   DIAGNOSIS:  A. BREAST, LEFT UPPER OUTER QUADRANT, POSTERIOR DEPTH; STEREOTACTIC CORE  NEEDLE BIOPSY:  - INVASIVE MAMMARY CARCINOMA, NO SPECIAL TYPE.  - CALCIFICATIONS ASSOCIATED WITH BENIGN AND NEOPLASTIC MAMMARY ELEMENTS.   Size of invasive carcinoma: 5 mm in this sample  Histologic grade of invasive carcinoma: Grade 2                       Glandular/tubular differentiation score: 3                       Nuclear pleomorphism score: 2                       Mitotic rate score: 1                       Total score: 6  Ductal carcinoma in situ: Present, grade 2-3, with focal comedonecrosis  Lymphovascular invasion: Not identified   ADDENDUM:  CASE SUMMARY: BREAST BIOMARKER TESTS  Estrogen Receptor (ER) Status: POSITIVE          Percentage of cells with nuclear positivity: Greater than 90%          Average intensity of staining: Strong   Progesterone Receptor (PgR) Status: NEGATIVE (LESS THAN 1%)          Internal control cells present and stain as expected   HER2 (by immunohistochemistry): NEGATIVE (Score 1+)  Ki-67: Not performed      07/17/2022 Definitive Surgery   Lumpectomy with SLNB with Dr. Aleen Campi   TUMOR Histologic Type: Invasive carcinoma of no special type Histologic Grade (Nottingham Histologic Score)      Glandular (Acinar)/Tubular Differentiation: 3      Nuclear Pleomorphism: 2      Mitotic Rate: 1      Overall Grade: Grade 2 Tumor Size: 5 mm Tumor Focality: Single focus of invasive carcinoma Ductal Carcinoma In Situ (DCIS): Present, intermediate grade Tumor Extent: Not applicable Lymphatic and/or Vascular Invasion: Not identified Treatment Effect in the Breast: No known presurgical therapy  MARGINS Margin Status for Invasive Carcinoma: All margins negative for invasive carcinoma      Distance from closest margin: At least 5 mm      Specify closest margin: All surgical margins  Margin Status for DCIS: All margins negative for  DCIS      Distance from DCIS to closest margin: At least 5 mm      Specify closest margin: All surgical margins  REGIONAL LYMPH NODES Regional Lymph Node Status: All regional lymph nodes negative for tumor      Total Number of Lymph Nodes Examined (sentinel and non-sentinel): 3       Number of Sentinel Nodes Examined: 3  DISTANT METASTASIS Distant Site(s) Involved, if applicable: Not applicable  PATHOLOGIC STAGE CLASSIFICATION (pTNM, AJCC 8th Edition): Modified Classification: Not applicable pT Category: pT1a T Suffix: Not applicable pN Category: pN0 N Suffix: sn pM Category: Not applicable  SPECIAL STUDIES Breast Biomarker Testing Performed on Previous Biopsy: [id number] Estrogen Receptor (ER) Status: POSITIVE         Percentage of cells with nuclear positivity: Greater than 90%         Average intensity of staining: Strong  Progesterone Receptor (PgR) Status: NEGATIVE (LESS THAN 1%)         Internal control cells present and stain as expected  HER2 (by immunohistochemistry): NEGATIVE (Score 1+) Ki-67: Not performed     Menarche age 2 Children  2 Age at first birth 4 Birth control OCP less than 5 years Menopause age 3 HRT no History of breast biopsies no Family history not significant  MEDICAL HISTORY:  Past Medical History:  Diagnosis Date   Anxiety    Breast cancer (HCC)    Cancer (HCC)    Carbuncle and furuncle of trunk    COPD (chronic obstructive pulmonary disease) (HCC)    Depression    Diabetes mellitus without complication (HCC)    Dyspnea    GERD (gastroesophageal reflux disease)    HTN (hypertension)    Hyperlipidemia    Intrinsic asthma, unspecified    Methicillin resistant Staphylococcus aureus in conditions classified elsewhere and of unspecified site    Other psychological or physical stress, not elsewhere classified(V62.89)    Personal history of radiation therapy    Pneumonia    Pre-diabetes    Tobacco abuse     SURGICAL HISTORY: Past Surgical History:  Procedure Laterality Date   BREAST BIOPSY Left 06/26/2022   stereo biopsy/ coil clip/ positive   BREAST LUMPECTOMY Left 07/17/2022   BREAST LUMPECTOMY,RADIO FREQ LOCALIZER,AXILLARY SENTINEL LYMPH NODE BIOPSY Left 07/17/2022   Procedure: BREAST LUMPECTOMY,RADIO FREQ LOCALIZER,AXILLARY SENTINEL LYMPH NODE BIOPSY;  Surgeon: Henrene Dodge, MD;  Location: [facility] OR[facility];  Service: General;  Laterality: Left;   COLONOSCOPY WITH PROPOFOL N/A 04/10/2015   Procedure: COLONOSCOPY WITH PROPOFOL;  Surgeon: Midge Minium, MD;  Location: [facility];  Service: Endoscopy;  Laterality: N/A;   COLONOSCOPY WITH PROPOFOL N/A 06/25/2021   Procedure: COLONOSCOPY WITH PROPOFOL;  Surgeon: Midge Minium, MD;  Location: [facility];  Service: Endoscopy;  Laterality: N/A;  leave at 8 arrival   ESOPHAGOGASTRODUODENOSCOPY (EGD) WITH PROPOFOL N/A 12/06/2021   Procedure: ESOPHAGOGASTRODUODENOSCOPY (EGD) WITH PROPOFOL;  Surgeon: Midge Minium, MD;  Location: [facility];  Service: Endoscopy;  Laterality: N/A;   POLYPECTOMY  04/10/2015   Procedure: POLYPECTOMY;   Surgeon: Midge Minium, MD;  Location: [facility];  Service: Endoscopy;;   POLYPECTOMY N/A 06/25/2021   Procedure: POLYPECTOMY;  Surgeon: Midge Minium, MD;  Location: [facility];  Service: Endoscopy;  Laterality: N/A;   STATUS POST ENDOMETRIAL ABLATION     TONSILLECTOMY     TUBAL LIGATION     bilateral, sterile ablation   VARICOSE VEINS      SOCIAL HISTORY: Social History  Socioeconomic History   Marital status: Widowed    Spouse name: Not on file   Number of children: 3   Years of education: 12   Highest education level: Not on file  Occupational History   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  Tobacco Use   Smoking status: Every Day    Current packs/day: 0.50    Average packs/day: 0.5 packs/day for 36.6 years (18.3 ttl pk-yrs)    Types: Cigarettes    Start date: 02/01/1987    Passive exposure: Past   Smokeless tobacco: Never   Tobacco comments:    Currently smoking 10 cigarettes a day  Vaping Use   Vaping status: Never Used  Substance and Sexual Activity   Alcohol use: No   Drug use: No   Sexual activity: Not Currently    Birth control/protection: Abstinence  Other Topics Concern   Not on file  Social History Narrative   Lives alone - daughter stays with her most of the time   Social Drivers of Health   Financial Resource Strain: Low Risk  (03/12/2023)   Overall Financial Resource Strain (CARDIA)    Difficulty of Paying Living Expenses: Not very hard  Food Insecurity: No Food Insecurity (03/12/2023)   Hunger Vital Sign    Worried About Running Out of Food in the Last Year: Never true    Ran Out of Food in the Last Year: Never true  Transportation Needs: No Transportation Needs (03/12/2023)   PRAPARE - Administrator, Civil Service (Medical): No    Lack of Transportation (Non-Medical): No  Physical Activity: Inactive (03/12/2023)   Exercise Vital Sign    Days of Exercise per Week: 0 days    Minutes of Exercise per  Session: 0 min  Stress: Stress Concern Present (03/12/2023)   [employer] of Occupational Health - Occupational Stress Questionnaire    Feeling of Stress : To some extent  Social Connections: Socially Isolated (03/12/2023)   Social Connection and Isolation Panel [NHANES]    Frequency of Communication with Friends and Family: More than three times a week    Frequency of Social Gatherings with Friends and Family: More than three times a week    Attends Religious Services: Never    Database administrator or Organizations: No    Attends Banker Meetings: Never    Marital Status: Widowed  Intimate Partner Violence: Not At Risk (03/12/2023)   Humiliation, Afraid, Rape, and Kick questionnaire    Fear of Current or Ex-Partner: No    Emotionally Abused: No    Physically Abused: No    Sexually Abused: No    FAMILY HISTORY: Family History  Problem Relation Age of Onset   Coronary artery disease Mother    Hypertension Mother    Rheum arthritis Mother    Heart attack Mother    Lung cancer Father    Emphysema Father    Gout Father    Diabetes Sister    Hypertension Daughter    Hypertension Daughter    Obesity Daughter    Obesity Daughter    Seizures Daughter    Breast cancer Neg Hx    Bladder Cancer Neg Hx    Kidney cancer Neg Hx     ALLERGIES:  is allergic to pravastatin sodium.  MEDICATIONS:  Current Outpatient Medications  Medication Sig Dispense Refill   acetaminophen (TYLENOL) 500 MG tablet Take 1,000 mg by mouth every 6 (six) hours as needed for  mild pain.     albuterol (VENTOLIN HFA) 108 (90 Base) MCG/ACT inhaler Inhale 1-2 puffs into the lungs every 6 (six) hours as needed for wheezing or shortness of breath. 18 g 11   ALPRAZolam (XANAX) 0.25 MG tablet Take 1 tablet (0.25 mg total) by mouth 2 (two) times daily as needed for anxiety. 180 tablet 0   amLODipine (NORVASC) 10 MG tablet TAKE 1 TABLET BY MOUTH DAILY 100 tablet 0   cetirizine (ZYRTEC) 10 MG tablet  Take 1 tablet (10 mg total) by mouth daily. 30 tablet 11   famotidine (PEPCID) 20 MG tablet Take 20 mg by mouth as needed for heartburn.     hydrochlorothiazide (HYDRODIURIL) 25 MG tablet TAKE 1 TABLET BY MOUTH DAILY 100 tablet 0   letrozole (FEMARA) 2.5 MG tablet TAKE 1 TABLET BY MOUTH DAILY  STARTING 1 WEEK AFTER COMPLETION OF RADIATION 100 tablet 2   metFORMIN (GLUCOPHAGE-XR) 750 MG 24 hr tablet TAKE 1 TABLET BY MOUTH DAILY  WITH BREAKFAST 100 tablet 1   PARoxetine (PAXIL) 10 MG tablet Take 1 tablet (10 mg total) by mouth daily. 90 tablet 3   potassium chloride SA (KLOR-CON M) 20 MEQ tablet Take 1 tablet (20 mEq total) by mouth daily for 5 days. 5 tablet 0   rosuvastatin (CRESTOR) 40 MG tablet TAKE 1 TABLET BY MOUTH DAILY 90 tablet 3   Spacer/Aero-Holding Chambers (AEROCHAMBER MV) inhaler Use as instructed 1 each 1   TRELEGY ELLIPTA 100-62.5-25 MCG/ACT AEPB USE 1 INHALATION BY MOUTH DAILY 180 each 3   No current facility-administered medications for this visit.    REVIEW OF SYSTEMS:   Pertinent information mentioned in HPI All other systems were reviewed with the patient and are negative.  PHYSICAL EXAMINATION: ECOG PERFORMANCE STATUS: 1 - Symptomatic but completely ambulatory  Vitals:   09/01/23 0948  BP: 114/64  Pulse: 86  Temp: 97.6 F (36.4 C)  SpO2: 93%     Filed Weights   09/01/23 0948  Weight: 169 lb (76.7 kg)      GENERAL:alert, no distress and comfortable SKIN: skin color, texture, turgor are normal, no rashes or significant lesions EYES: normal, conjunctiva are pink and non-injected, sclera clear OROPHARYNX:no exudate, no erythema and lips, buccal mucosa, and tongue normal  NECK: supple, thyroid normal size, non-tender, without nodularity LYMPH:  no palpable lymphadenopathy in the cervical, axillary or inguinal LUNGS: clear to auscultation and percussion with normal breathing effort HEART: regular rate & rhythm and no murmurs and no lower extremity  edema ABDOMEN:abdomen soft, non-tender and normal bowel sounds Musculoskeletal:no cyanosis of digits and no clubbing  PSYCH: alert & oriented x 3 with fluent speech NEURO: no focal motor/sensory deficits  LABORATORY DATA:  I have reviewed the data as listed Lab Results  Component Value Date   WBC 7.7 09/01/2023   HGB 14.8 09/01/2023   HCT 43.1 09/01/2023   MCV 85.5 09/01/2023   PLT 271 09/01/2023   Recent Labs    02/27/23 1011 04/16/23 1018 09/01/23 0949  NA 137 137 134*  K 3.5 4.3 3.3*  CL 101 96 95*  CO2 27 26 29   GLUCOSE 104* 113* 97  BUN 11 10 10   CREATININE 0.70 0.79 0.69  CALCIUM 10.0 11.0* 10.0  GFRNONAA >60  --  >60  PROT 7.2 6.6 7.0  ALBUMIN 4.1 4.4 4.4  AST 17 17 18   ALT 15 14 15   ALKPHOS 85 111 75  BILITOT 0.3 0.4 0.6    RADIOGRAPHIC STUDIES: I  have personally reviewed the radiological images as listed and agreed with the findings in the report. No results found.

## 2023-09-01 NOTE — Progress Notes (Signed)
Appetite 50% normal, no supplements.

## 2023-09-01 NOTE — Patient Instructions (Addendum)
Please take Potassium supplements once a day for 5 days.   Please cut down your calcium/ Vit D supplements to 1 tablet every other day.   I will see you back in 6 months.   Repeat mammogram in oct 2025.

## 2023-09-05 ENCOUNTER — Other Ambulatory Visit: Payer: Self-pay | Admitting: Family Medicine

## 2023-09-05 DIAGNOSIS — I10 Essential (primary) hypertension: Secondary | ICD-10-CM

## 2023-09-15 ENCOUNTER — Telehealth: Payer: Self-pay | Admitting: Family Medicine

## 2023-09-15 DIAGNOSIS — I10 Essential (primary) hypertension: Secondary | ICD-10-CM

## 2023-09-15 MED ORDER — AMLODIPINE BESYLATE 10 MG PO TABS: 10.0000 mg | ORAL_TABLET | Freq: Every day | ORAL | 0 refills | Status: DC

## 2023-09-15 NOTE — Telephone Encounter (Signed)
[facility] faxed refill request for the following medications:   amLODipine (NORVASC) 10 MG tablet   Please advise.

## 2023-10-15 ENCOUNTER — Other Ambulatory Visit: Payer: Self-pay

## 2023-10-15 ENCOUNTER — Telehealth: Payer: Self-pay | Admitting: Family Medicine

## 2023-10-15 ENCOUNTER — Other Ambulatory Visit: Payer: Self-pay | Admitting: Family Medicine

## 2023-10-15 DIAGNOSIS — F39 Unspecified mood [affective] disorder: Secondary | ICD-10-CM

## 2023-10-15 MED ORDER — ALPRAZOLAM 0.25 MG PO TABS
0.2500 mg | ORAL_TABLET | Freq: Two times a day (BID) | ORAL | 0 refills | Status: DC | PRN
Start: 2023-10-15 — End: 2024-01-22

## 2023-10-15 NOTE — Telephone Encounter (Signed)
[facility] called and spoke to [place], [address] about the refill(s) paroxetine and rosuvastatin requested She says both were processed yesterday, so the patient should be receiving it by early next week if not the end of this week.

## 2023-10-15 NOTE — Telephone Encounter (Signed)
[facility] faxed refill request for the following medications:   ALPRAZolam (XANAX) 0.25 MG tablet      Please advise.

## 2023-10-15 NOTE — Telephone Encounter (Signed)
Medication Refill -  Most Recent Primary Care Visit:  Provider: Merita Norton T  Department: [id number]  Visit Type: MYCHART VIDEO VISIT  Date: 07/15/2023  Medication: PARoxetine (PAXIL) 10 MG tablet ,  rosuvastatin (CRESTOR) 40 MG tablet   Has the patient contacted their pharmacy? Yes   Is this the correct pharmacy for this prescription? Yes  This is the patient's preferred pharmacy:    [facility] [place] [place] - [address] [place] [address] Phone: [phone] Fax: [phone]   Has the prescription been filled recently? Yes  Is the patient out of the medication? No  Has the patient been seen for an appointment in the last year OR does the patient have an upcoming appointment? Yes  Can we respond through MyChart? No  Agent: Please be advised that Rx refills may take up to 3 business days. We ask that you follow-up with your pharmacy.

## 2023-10-24 ENCOUNTER — Encounter: Payer: Self-pay | Admitting: Family Medicine

## 2023-10-24 ENCOUNTER — Ambulatory Visit (INDEPENDENT_AMBULATORY_CARE_PROVIDER_SITE_OTHER): Payer: 59 | Admitting: Family Medicine

## 2023-10-24 VITALS — BP 125/90 | HR 77 | Ht 67.0 in | Wt 162.8 lb

## 2023-10-24 DIAGNOSIS — E119 Type 2 diabetes mellitus without complications: Secondary | ICD-10-CM

## 2023-10-24 DIAGNOSIS — E1169 Type 2 diabetes mellitus with other specified complication: Secondary | ICD-10-CM | POA: Diagnosis not present

## 2023-10-24 DIAGNOSIS — E785 Hyperlipidemia, unspecified: Secondary | ICD-10-CM

## 2023-10-24 DIAGNOSIS — Z7984 Long term (current) use of oral hypoglycemic drugs: Secondary | ICD-10-CM

## 2023-10-24 DIAGNOSIS — F39 Unspecified mood [affective] disorder: Secondary | ICD-10-CM

## 2023-10-24 DIAGNOSIS — E1159 Type 2 diabetes mellitus with other circulatory complications: Secondary | ICD-10-CM

## 2023-10-24 DIAGNOSIS — I152 Hypertension secondary to endocrine disorders: Secondary | ICD-10-CM

## 2023-10-24 DIAGNOSIS — F17200 Nicotine dependence, unspecified, uncomplicated: Secondary | ICD-10-CM | POA: Diagnosis not present

## 2023-10-24 NOTE — Progress Notes (Signed)
 Established Patient Office Visit  Introduced to nurse practitioner role and practice setting.  All questions answered.  Discussed provider/patient relationship and expectations.   Subjective   Patient ID: Alexandria [name], female    DOB: Feb 14, 1956  Age: 68 y.o. MRN: [id number]  Chief Complaint  Patient presents with   Follow-up    Medication follow up    Alexandria [name] is a 68 year old female with hypertension, hyperlipidemia, type two diabetes, tobacco dependence, mood disorder, and history of left breast cancer who presents for follow-up on medication management.She has no major concerns today.  Her hypertension is generally well-controlled with Norvasc 10mg  and hydrochlorothiazide 25mg . She notes a slight elevation in blood pressure today, which she attributes to taking her medication later than usual due to the appointment. She monitors her blood pressure at home, aiming for a systolic under 130 and diastolic under 80.  DMII - she is due for an A1c check today. Her A1c has never exceeded seven and is usually borderline. She has an upcoming appointment with her eye doctor in September for diabetes screening.  Hx of left breast cancer and is currently on letrozole. She underwent surgery one or two years ago and completed radiation therapy in January or February of last year. She follows up with her oncologist every six months and is on letrozole for a total of five years.  Her hyperlipidemia is managed with Crestor 40 mg daily.   COPD: She also uses albuterol as needed and Trelegy daily for respiratory issues.  GAD - Paxil 10 mg daily and Xanax 0.25 mg as needed. She takes Xanax once in the morning and sometimes a second dose during the day due to anxiety and tension in her neck. She has a history of taking clonazepam for about ten years but switched to Xanax due to concerns about memory issues.  She smokes a pack or less of cigarettes a day and has attempted to reduce her smoking to 18  cigarettes a day. She tries to manage her anxiety through mental control and activities like crossword puzzles.        10/24/2023    9:08 AM 07/15/2023   10:42 AM 04/16/2023    9:48 AM  Depression screen PHQ 2/9  Decreased Interest 0 0 1  Down, Depressed, Hopeless 0 0 0  PHQ - 2 Score 0 0 1  Altered sleeping 1 0   Tired, decreased energy 1 3   Change in appetite 0 0   Feeling bad or failure about yourself  0 0   Trouble concentrating 0 0   Moving slowly or fidgety/restless 0 0   Suicidal thoughts 0 0   PHQ-9 Score 2 3   Difficult doing work/chores Not difficult at all         10/24/2023    9:09 AM 07/15/2023   10:44 AM 01/23/2023    9:38 AM  GAD 7 : Generalized Anxiety Score  Nervous, Anxious, on Edge 1 0 1  Control/stop worrying 0 1 0  Worry too much - different things 1 1 0  Trouble relaxing 1 0 1  Restless 0 1 1  Easily annoyed or irritable 1 1 0  Afraid - awful might happen 0 0 0  Total GAD 7 Score 4 4 3   Anxiety Difficulty Not difficult at all Not difficult at all Not difficult at all     Review of Systems  All other systems reviewed and are negative.   Negative unless indicated  in HPI   Objective:     BP (!) 125/90 (BP Location: Left Arm, Patient Position: Sitting, Cuff Size: Normal)   Pulse 77   Ht 5\' 7"  (1.702 m)   Wt 162 lb 12.8 oz (73.8 kg)   SpO2 97%   BMI 25.50 kg/m    Physical Exam Constitutional:      General: She is not in acute distress.    Appearance: Normal appearance. She is normal weight. She is not toxic-appearing or diaphoretic.  HENT:     Head: Normocephalic.     Nose: Nose normal.     Mouth/Throat:     Mouth: Mucous membranes are moist.     Dentition: Abnormal dentition.     Pharynx: Oropharynx is clear.  Eyes:     Extraocular Movements: Extraocular movements intact.     Pupils: Pupils are equal, round, and reactive to light.  Neck:     Vascular: No carotid bruit.  Cardiovascular:     Rate and Rhythm: Normal rate and  regular rhythm.     Pulses: Normal pulses.          Dorsalis pedis pulses are 2+ on the right side and 2+ on the left side.       Posterior tibial pulses are 2+ on the right side and 2+ on the left side.     Heart sounds: Normal heart sounds. No murmur heard.    No friction rub. No gallop.  Pulmonary:     Effort: No respiratory distress.     Breath sounds: No stridor. No wheezing, rhonchi or rales.     Comments: Diminished bilaterally  Chest:     Chest wall: No tenderness.  Musculoskeletal:     Right lower leg: No edema.     Left lower leg: No edema.     Right foot: Normal range of motion. No deformity, bunion, Charcot foot, foot drop or prominent metatarsal heads.     Left foot: Normal range of motion. No deformity, bunion, Charcot foot, foot drop or prominent metatarsal heads.  Feet:     Right foot:     Protective Sensation: 10 sites tested.  10 sites sensed.     Skin integrity: Skin integrity normal.     Toenail Condition: Right toenails are normal.     Left foot:     Protective Sensation: 10 sites tested.       Skin integrity: Skin integrity normal.     Toenail Condition: Left toenails are normal.  Lymphadenopathy:     Cervical: No cervical adenopathy.  Skin:    General: Skin is warm and dry.     Capillary Refill: Capillary refill takes less than 2 seconds.  Neurological:     General: No focal deficit present.     Mental Status: She is alert and oriented to person, place, and time. Mental status is at baseline.  Psychiatric:        Mood and Affect: Mood normal.        Behavior: Behavior normal.        Thought Content: Thought content normal.        Judgment: Judgment normal.      No results found for any visits on 10/24/23.    The 10-year ASCVD risk score (Arnett DK, et al., 2019) is: 26.3%    Assessment & Plan:  Type 2 diabetes mellitus without complication, without long-term current use of insulin (HCC) Assessment & Plan: Chronic, stable Last A1C = 6.3 -  controlled  August 2024 Patient is on Metformin 750mg  daily. -Order A1C and urine acr - Continue well balance diet, water as drink of choice, smaller portions, increase proteins, fruits, and veggies. Decrease processed foods.  - Foot exam done Eye exam planned in September 2025 per pt.  - On statin  Orders: -     Hemoglobin A1c -     Microalbumin / creatinine urine ratio  Hypertension associated with diabetes (HCC) Assessment & Plan: Elevated blood pressure at today's visit Reported taking medications right before appt.  Continue Norvasc 10mg  and Hydrochlorothiazide 25mg  daily. -Monitor blood pressure at home -Goal<130/80 -low sodium diet   Orders: -     Comprehensive metabolic panel  Hyperlipidemia associated with type 2 diabetes mellitus (HCC) Assessment & Plan: Chronic On Crestor 40mg   Orders: -     Lipid panel  Tobacco dependence Assessment & Plan: Chronic, pt remains pre-contemplative at this time iso cessation efforts Pt on statin    Orders: -     CBC  Mood disorder (HCC) Assessment & Plan: Patient is on Paxil 10mg  daily and Xanax 0.25mg  as needed BID Patient reports anxiety and tension, particularly at night - which is when she will take xaxax - Discussed xanax is an as needed medication, and should be cautious for daily use given side effects -Patient willing to take Xanax every other day, as well as half her tablet. -Discussed potential future trial of Buspar as an alternative to Xanax - declines prescription as this time    Will communicate labs.   Return in about 6 months (around 04/22/2024).   I, Sallee Provencal, FNP, have reviewed all documentation for this visit. The documentation on 10/24/23 for the exam, diagnosis, procedures, and orders are all accurate and complete.   Sallee Provencal, FNP

## 2023-10-24 NOTE — Assessment & Plan Note (Signed)
 Elevated blood pressure at today's visit Reported taking medications right before appt.  Continue Norvasc 10mg  and Hydrochlorothiazide 25mg  daily. -Monitor blood pressure at home -Goal<130/80 -low sodium diet

## 2023-10-24 NOTE — Assessment & Plan Note (Deleted)
 Chronic, stable Last A1C = 6.3 - controlled August 2024 Patient is on Metformin 750mg  daily. -Order A1C and urine acr - Continue well balance diet, water as drink of choice, smaller portions, increase proteins, fruits, and veggies. Decrease processed foods.  - Foot exam done Eye exam planned in September 2025 per pt.

## 2023-10-24 NOTE — Assessment & Plan Note (Addendum)
 Chronic, stable Last A1C = 6.3 - controlled August 2024 Patient is on Metformin 750mg  daily. -Order A1C and urine acr - Continue well balance diet, water as drink of choice, smaller portions, increase proteins, fruits, and veggies. Decrease processed foods.  - Foot exam done Eye exam planned in September 2025 per pt.  - On statin

## 2023-10-24 NOTE — Assessment & Plan Note (Signed)
 Chronic, pt remains pre-contemplative at this time iso cessation efforts Pt on statin

## 2023-10-24 NOTE — Assessment & Plan Note (Addendum)
 Patient is on Paxil 10mg  daily and Xanax 0.25mg  as needed BID Patient reports anxiety and tension, particularly at night - which is when she will take xaxax - Discussed xanax is an as needed medication, and should be cautious for daily use given side effects -Patient willing to take Xanax every other day, as well as half her tablet. -Discussed potential future trial of Buspar as an alternative to Xanax - declines prescription as this time

## 2023-10-24 NOTE — Assessment & Plan Note (Signed)
 Chronic On Crestor 40mg 

## 2023-10-26 LAB — COMPREHENSIVE METABOLIC PANEL
ALT: 11 IU/L (ref 0–32)
AST: 14 IU/L (ref 0–40)
Albumin: 4.6 g/dL (ref 3.9–4.9)
Alkaline Phosphatase: 123 IU/L — ABNORMAL HIGH (ref 44–121)
BUN/Creatinine Ratio: 9 — ABNORMAL LOW (ref 12–28)
BUN: 7 mg/dL — ABNORMAL LOW (ref 8–27)
Bilirubin Total: 0.5 mg/dL (ref 0.0–1.2)
CO2: 24 mmol/L (ref 20–29)
Calcium: 10.8 mg/dL — ABNORMAL HIGH (ref 8.7–10.3)
Chloride: 93 mmol/L — ABNORMAL LOW (ref 96–106)
Creatinine, Ser: 0.74 mg/dL (ref 0.57–1.00)
Globulin, Total: 2.2 g/dL (ref 1.5–4.5)
Glucose: 108 mg/dL — ABNORMAL HIGH (ref 70–99)
Potassium: 3.7 mmol/L (ref 3.5–5.2)
Sodium: 136 mmol/L (ref 134–144)
Total Protein: 6.8 g/dL (ref 6.0–8.5)
eGFR: 88 mL/min/{1.73_m2} (ref >59)

## 2023-10-26 LAB — HEMOGLOBIN A1C
Est. average glucose Bld gHb Est-mCnc: 128 mg/dL
Hgb A1c MFr Bld: 6.1 % — ABNORMAL HIGH (ref 4.8–5.6)

## 2023-10-26 LAB — LIPID PANEL
Chol/HDL Ratio: 3 ratio (ref 0.0–4.4)
Cholesterol, Total: 130 mg/dL (ref 100–199)
HDL: 43 mg/dL (ref >39)
LDL Chol Calc (NIH): 64 mg/dL (ref 0–99)
Triglycerides: 130 mg/dL (ref 0–149)
VLDL Cholesterol Cal: 23 mg/dL (ref 5–40)

## 2023-10-26 LAB — CBC
Hematocrit: 46.1 % (ref 34.0–46.6)
Hemoglobin: 15.3 g/dL (ref 11.1–15.9)
MCH: 28.8 pg (ref 26.6–33.0)
MCHC: 33.2 g/dL (ref 31.5–35.7)
MCV: 87 fL (ref 79–97)
Platelets: 324 10*3/uL (ref 150–450)
RBC: 5.31 x10E6/uL — ABNORMAL HIGH (ref 3.77–5.28)
RDW: 13.5 % (ref 11.7–15.4)
WBC: 8 10*3/uL (ref 3.4–10.8)

## 2023-10-26 LAB — MICROALBUMIN / CREATININE URINE RATIO
Creatinine, Urine: 184.7 mg/dL
Microalb/Creat Ratio: 41 mg/g{creat} — ABNORMAL HIGH (ref 0–29)
Microalbumin, Urine: 76.3 ug/mL

## 2023-10-27 ENCOUNTER — Encounter: Payer: Self-pay | Admitting: Family Medicine

## 2023-10-27 ENCOUNTER — Other Ambulatory Visit: Payer: Self-pay | Admitting: Family Medicine

## 2023-10-27 DIAGNOSIS — I152 Hypertension secondary to endocrine disorders: Secondary | ICD-10-CM

## 2023-10-31 ENCOUNTER — Telehealth: Payer: Self-pay

## 2023-10-31 NOTE — Progress Notes (Signed)
 Care Guide Pharmacy Note  10/31/2023 Name: ANDREANA KLINGERMAN MRN: [id number] DOB: 08-Apr-1956  Referred By: Sallee Provencal, FNP Reason for referral: Care Coordination (Outreach to schedule with Pharm d )   Keira Bohlin Eifler is a 68 y.o. year old female who is a primary care patient of Sallee Provencal, FNP.  Nelly Rout Giglia was referred to the pharmacist for assistance related to: HTN  Successful contact was made with the patient to discuss pharmacy services including being ready for the pharmacist to call at least 5 minutes before the scheduled appointment time and to have medication bottles and any blood pressure readings ready for review. The patient agreed to meet with the pharmacist via telephone visit on (date/time).11/13/2023  Penne Lash , RMA     [place]  [facility] Guide  Direct Dial: [phone]  Website: [email or web address]

## 2023-11-06 ENCOUNTER — Encounter: Payer: Self-pay | Admitting: Radiation Oncology

## 2023-11-06 ENCOUNTER — Ambulatory Visit
Admission: RE | Admit: 2023-11-06 | Discharge: 2023-11-06 | Disposition: A | Discharge status: Home / Self Care | Payer: 59 | Source: Ambulatory Visit | Attending: Radiation Oncology | Admitting: Radiation Oncology

## 2023-11-06 VITALS — BP 140/83 | HR 94 | Temp 97.0°F | Resp 16 | Ht 67.0 in | Wt 164.5 lb

## 2023-11-06 DIAGNOSIS — Z79811 Long term (current) use of aromatase inhibitors: Secondary | ICD-10-CM | POA: Diagnosis not present

## 2023-11-06 DIAGNOSIS — Z170 Estrogen receptor positive status [ER+]: Secondary | ICD-10-CM | POA: Insufficient documentation

## 2023-11-06 DIAGNOSIS — C50412 Malignant neoplasm of upper-outer quadrant of left female breast: Secondary | ICD-10-CM | POA: Diagnosis present

## 2023-11-06 DIAGNOSIS — Z923 Personal history of irradiation: Secondary | ICD-10-CM | POA: Insufficient documentation

## 2023-11-06 NOTE — Progress Notes (Signed)
 Radiation Oncology Follow up Note  Name: Alexandria [name]   Date:   11/06/2023 MRN:  [id number] DOB: 1955-11-24    This 68 y.o. 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  Seen today in routine follow-up she occasionally does have some pain in her left breast which I assured her is typical of postlumpectomy pain from scarring.  She otherwise specifically denies cough or bone pain..  She mammograms back in October which I have reviewed were BI-RADS 2 benign.  She is currently on letrozole tolerating that well without side effect.  COMPLICATIONS OF TREATMENT: none  FOLLOW UP COMPLIANCE: keeps appointments   PHYSICAL EXAM:  BP (!) 140/83   Pulse 94   Temp (!) 97 F (36.1 C) (Tympanic)   Resp 16   Ht 5\' 7"  (1.702 m)   Wt 164 lb 8 oz (74.6 kg)   SpO2 97%   BMI 25.76 kg/m  Lungs are clear to A&P cardiac examination essentially unremarkable with regular rate and rhythm. No dominant mass or nodularity is noted in either breast in 2 positions examined. Incision is well-healed. No axillary or supraclavicular adenopathy is appreciated. Cosmetic result is excellent.  Well-developed well-nourished patient in NAD. HEENT reveals PERLA, EOMI, discs not visualized.  Oral cavity is clear. No oral mucosal lesions are identified. Neck is clear without evidence of cervical or supraclavicular adenopathy. Lungs are clear to A&P. Cardiac examination is essentially unremarkable with regular rate and rhythm without murmur rub or thrill. Abdomen is benign with no organomegaly or masses noted. Motor sensory and DTR levels are equal and symmetric in the upper and lower extremities. Cranial nerves II  through XII are grossly intact. Proprioception is intact. No peripheral adenopathy or edema is identified. No motor or sensory levels are noted. Crude visual fields are within normal range.  RADIOLOGY RESULTS: Mammograms reviewed compatible with above-stated findings  PLAN: Present time patient is doing well now out 13 months with no evidence of disease.  And pleased with her overall progress.  I have asked to see her back in 1 year for follow-up.  She continues on letrozole without side effect.  Patient knows to call with any concerns at any time.  I would like to take this opportunity to thank you for allowing me to participate in the care of your patient.Alexandria Miller, MD

## 2023-11-13 ENCOUNTER — Other Ambulatory Visit: Payer: Self-pay | Admitting: Pharmacist

## 2023-11-13 VITALS — BP 163/67 | HR 76

## 2023-11-13 DIAGNOSIS — E1159 Type 2 diabetes mellitus with other circulatory complications: Secondary | ICD-10-CM

## 2023-11-13 MED ORDER — LISINOPRIL 20 MG PO TABS
20.0000 mg | ORAL_TABLET | Freq: Every day | ORAL | 0 refills | Status: DC
Start: 2023-11-13 — End: 2024-01-20

## 2023-11-13 NOTE — Progress Notes (Signed)
 11/13/2023 Name: Alexandria [name] MRN: [id number] DOB: [birth year]  Chief Complaint  Patient presents with   Hypertension    Alexandria [name] is a 68 y.o. year old female who presented for a telephone visit.   They were referred to the pharmacist by their PCP for assistance in managing hypertension.    Subjective:  Care Team: Primary Care Provider: Sallee Provencal, FNP ; Next Scheduled Visit: 03/17/24 Clinical Pharmacist: Ricka Burdock, PharmD  Medication Access/Adherence  Current Pharmacy:  [facility] - [place] - [phone] [address] [place] [address] Phone: [phone] Fax: [phone]  [facility] [address] ([address]), [place] - [address] [place]) [place] [address] Phone: [phone] Fax: [phone]   Patient reports affordability concerns with their medications: No  Patient reports access/transportation concerns to their pharmacy: No  Patient reports adherence concerns with their medications:  No     Hypertension:  Current medications: Amlodipine 10mg  daily, Hydrochlorothiazide 25mg  daily Medications previously tried: None  Patient has a validated, automated, upper arm home BP cuff Current blood pressure readings readings: 163/67 at home  Patient denies hypotensive s/sx including dizziness, lightheadedness.  Patient denies hypertensive symptoms including headache, chest pain, shortness of breath  Current meal patterns:  Breakfast 9AM: Bowl of cereal (Honey nut cheerios), occasional egg or toast Lunch: Banana sandwich, occasionally skips Dinner: Pork chops, chicken tenders, sandwich, green beans, garden peas; usually meat unless salad alone Drinks: Water, mini soda cans, 16 oz cup with 4-5/day; two 8-12 oz of Folgers half-caff (around ~120mg  caffeine per day)  Current physical activity: Yard work and walking to [employer]   Objective:  Lab Results  Component  Value Date   HGBA1C 6.1 (H) 10/24/2023    Lab Results  Component Value Date   CREATININE 0.74 10/24/2023   BUN 7 (L) 10/24/2023   NA 136 10/24/2023   K 3.7 10/24/2023   CL 93 (L) 10/24/2023   CO2 24 10/24/2023    Lab Results  Component Value Date   CHOL 130 10/24/2023   HDL 43 10/24/2023   LDLCALC 64 10/24/2023   TRIG 130 10/24/2023   CHOLHDL 3.0 10/24/2023    Medications Reviewed Today     Reviewed by Linna Darner, RPH (Pharmacist) on 11/13/23 at 1319  Med List Status: <None>   Medication Order Taking? Sig Documenting Provider Last Dose Status Informant  acetaminophen (TYLENOL) 500 MG tablet [id number] Yes Take 1,000 mg by mouth every 6 (six) hours as needed for mild pain. [provider] Taking Active Self, Child  albuterol (VENTOLIN HFA) 108 (90 Base) MCG/ACT inhaler [id number] Yes Inhale 1-2 puffs into the lungs every 6 (six) hours as needed for wheezing or shortness of breath. Jacky Kindle, FNP Taking Active   ALPRAZolam Prudy Feeler) 0.25 MG tablet [id number] Yes Take 1 tablet (0.25 mg total) by mouth 2 (two) times daily as needed for anxiety. Needs appt. with PCP for any further refills. Sallee Provencal, FNP Taking Active   amLODipine (NORVASC) 10 MG tablet [id number] Yes Take 1 tablet (10 mg total) by mouth daily. Debera Lat, PA-C Taking Active   aspirin EC 81 MG tablet [id number] Yes Take 81 mg by mouth daily. Swallow whole. [provider] Taking Active   Calcium Carb-Cholecalciferol (CALCIUM PLUS VITAMIN D3) 600-20 MG-MCG TABS [id number] Yes Take 1 tablet by mouth every Monday, Wednesday, and Friday. [provider] Taking Active   cetirizine (  ZYRTEC) 10 MG tablet [id number] Yes Take 1 tablet (10 mg total) by mouth daily. Margaretann Loveless, PA-C Taking Active Child           Med Note Constance Haw, FELECIA N   Thu Aug 29, 2022  9:19 AM) Reports taking as needed  famotidine (PEPCID) 20 MG tablet [id number] Yes Take 20 mg by mouth as needed for  heartburn. [provider] Taking Active Self, Child    Discontinued 01/06/20 1748   hydrochlorothiazide (HYDRODIURIL) 25 MG tablet [id number] Yes TAKE 1 TABLET BY MOUTH DAILY Jacky Kindle, FNP Taking Active   letrozole [facility]) 2.5 MG tablet [id number] Yes TAKE 1 TABLET BY MOUTH DAILY  STARTING 1 WEEK AFTER COMPLETION OF RADIATION Alinda Dooms, NP Taking Active   metFORMIN (GLUCOPHAGE-XR) 750 MG 24 hr tablet [id number] Yes TAKE 1 TABLET BY MOUTH DAILY  WITH Acquanetta Chain, FNP Taking Active   Multiple Vitamin (MULTIVITAMIN ADULT PO) [id number] Yes Take 1 tablet by mouth daily. [provider] Taking Active            Med Note Valentina Lucks, Kariem Wolfson M   Thu Nov 13, 2023  1:18 PM) Ocasionally runs out a stops temporarily  PARoxetine (PAXIL) 10 MG tablet [id number] Yes Take 1 tablet (10 mg total) by mouth daily. Jacky Kindle, FNP Taking Active   rosuvastatin (CRESTOR) 40 MG tablet [id number] Yes TAKE 1 TABLET BY MOUTH DAILY Jacky Kindle, FNP Taking Active   Spacer/Aero-Holding Chambers (AEROCHAMBER MV) inhaler [id number] Yes Use as instructed Domenick Gong, MD Taking Active Child  TRELEGY ELLIPTA 100-62.5-25 MCG/ACT AEPB [id number] Yes USE 1 INHALATION BY MOUTH DAILY Jacky Kindle, FNP Taking Active               Assessment/Plan:   Hypertension: - Currently uncontrolled - Reviewed long term cardiovascular and renal outcomes of uncontrolled blood pressure - Reviewed appropriate blood pressure monitoring technique and reviewed goal blood pressure. Recommended to check home blood pressure and heart rate every other day - Recommend to remove extra cup of Folgers half-caff each day to make it around 60mg  of caffeine rather than 120mg    Follow Up Plan:  - Send message regarding follow-up in MyChart message once deciding on new BP medication with PCP - Brother-in-law lives next door and helps take care of him daily - Send prescription to [facility] for BP medication  approved - Ask Caryl Asp about stopping calcium supplement completely (has been taking 600mg  calcium every M,W,F since December and still elevated on last labs) - Ask Caryl Asp about adding BP medication- lisinopril 20mg  daily (would help with kidney protection)    Ricka Burdock, PharmD [facility] Phone Number: [phone]

## 2023-11-23 ENCOUNTER — Other Ambulatory Visit: Payer: Self-pay | Admitting: Physician Assistant

## 2023-11-23 DIAGNOSIS — I10 Essential (primary) hypertension: Secondary | ICD-10-CM

## 2023-11-24 NOTE — Telephone Encounter (Signed)
 Requested Prescriptions  Pending Prescriptions Disp Refills   amLODipine (NORVASC) 10 MG tablet [Pharmacy Med Name: amLODIPine Besylate 10 MG Oral Tablet] 100 tablet 1    Sig: TAKE 1 TABLET BY MOUTH DAILY     Cardiovascular: Calcium Channel Blockers 2 Failed - 11/24/2023  5:23 PM      Failed - Last BP in normal range    BP Readings from Last 1 Encounters:  11/13/23 (!) 163/67         Passed - Last Heart Rate in normal range    Pulse Readings from Last 1 Encounters:  11/13/23 76         Passed - Valid encounter within last 6 months    Recent Outpatient Visits           4 months ago Mood disorder [facility])   [place] [facility] Merita Norton T, FNP   5 months ago Mood disorder [facility])   [place] [facility] Jacky Kindle, FNP   7 months ago Annual physical exam   [facility] Merita Norton T, FNP   10 months ago Adjustment disorder with anxious mood   [place] [facility] Merita Norton T, FNP   1 year ago COPD with hypoxia [facility])   [place] [facility] Jacky Kindle, FNP       Future Appointments             In 4 months Ephriam Knuckles, Jennette Kettle, FNP [facility], [facility]

## 2023-11-27 ENCOUNTER — Telehealth: Payer: Self-pay | Admitting: Pharmacist

## 2023-11-27 NOTE — Progress Notes (Signed)
   11/27/2023 Name: Alexandria [name] MRN: [id number] DOB: [birth year]  Chief Complaint  Patient presents with   Hypertension    Alexandria [name] is a 68 y.o. year old female who presented for a telephone visit.   They were referred to the pharmacist by their PCP for assistance in managing hypertension.   Referred for high BP management  Subjective:  Care Team: Primary Care Provider: Sallee Provencal, FNP ; Next Scheduled Visit: 03/17/24 Clinical Pharmacist: Ricka Burdock, PharmD  Medication Access/Adherence  Current Pharmacy:  [facility] - [place] - [address] [place] [address] Phone: [phone] Fax: [phone]  [facility] [address] ([address]), [place] - [address] [place]) [place] [address] Phone: [phone] Fax: [phone]   Patient reports affordability concerns with their medications: No  Patient reports access/transportation concerns to their pharmacy: No  Patient reports adherence concerns with their medications:  No     Hypertension:  Current medications: Amlodipine 10mg  daily, Hydrochlorothiazide 25mg  daily, Lisinopril 20mg  daily Medications previously tried: None  Patient has a validated, automated, upper arm home BP cuff Current blood pressure readings readings: 140/70's with HR 70-80 on average currently  Patient denies hypotensive s/sx including dizziness, lightheadedness.  Patient denies hypertensive symptoms including headache, chest pain, shortness of breath  Current meal patterns:  Breakfast 9AM: Bowl of cereal (Honey nut cheerios), occasional egg or toast Lunch: Banana sandwich, occasionally skips Dinner: Pork chops, chicken tenders, sandwich, green beans, garden peas; usually meat unless salad alone Drinks: Water, mini soda cans, 16 oz cup with 4-5/day; two 8-12 oz of Folgers half-caff (around ~120mg  caffeine per day)  Current physical  activity: Yard work and walking to [employer]   Objective:  Lab Results  Component Value Date   HGBA1C 6.1 (H) 10/24/2023    Lab Results  Component Value Date   CREATININE 0.74 10/24/2023   BUN 7 (L) 10/24/2023   NA 136 10/24/2023   K 3.7 10/24/2023   CL 93 (L) 10/24/2023   CO2 24 10/24/2023    Lab Results  Component Value Date   CHOL 130 10/24/2023   HDL 43 10/24/2023   LDLCALC 64 10/24/2023   TRIG 130 10/24/2023   CHOLHDL 3.0 10/24/2023    Medications Reviewed Today   Medications were not reviewed in this encounter       Assessment/Plan:   Hypertension: - Currently uncontrolled - Reviewed long term cardiovascular and renal outcomes of uncontrolled blood pressure - Reviewed appropriate blood pressure monitoring technique and reviewed goal blood pressure. Recommended to check home blood pressure and heart rate every other day - Recommend to remove extra cup of Folgers half-caff each day to make it around 60mg  of caffeine rather than 120mg    Follow Up Plan:  - Follow-up after post-op visit on 12/29/23 to see how BP looked at that visit  - Machine reads higher than what was seen in the office - Doing well with additional of Lisinopril currently - Brother-in-law lives next door and helps take care of him daily - Only concern currently is allergies- taking Zyrtec to help    Ricka Burdock, PharmD [place]  Phone Number: [phone]

## 2023-12-29 ENCOUNTER — Ambulatory Visit (INDEPENDENT_AMBULATORY_CARE_PROVIDER_SITE_OTHER): Admitting: Surgery

## 2023-12-29 ENCOUNTER — Encounter: Payer: Self-pay | Admitting: Surgery

## 2023-12-29 VITALS — BP 162/69 | HR 83 | Temp 98.1°F | Ht 67.0 in | Wt 161.0 lb

## 2023-12-29 DIAGNOSIS — C50412 Malignant neoplasm of upper-outer quadrant of left female breast: Secondary | ICD-10-CM | POA: Diagnosis not present

## 2023-12-29 DIAGNOSIS — Z170 Estrogen receptor positive status [ER+]: Secondary | ICD-10-CM | POA: Diagnosis not present

## 2023-12-29 NOTE — Patient Instructions (Signed)
 Breast Self-Awareness Breast self-awareness is knowing how your breasts look and feel. You need to: Check your breasts on a regular basis. Tell your doctor about any changes. Become familiar with the look and feel of your breasts. This can help you catch a breast problem while it is still small and can be treated. You should do breast self-exams even if you have breast implants. What you need: A mirror. A well-lit room. A pillow or other soft object. How to do a breast self-exam Follow these steps to do a breast self-exam: Look for changes  Take off all the clothes above your waist. Stand in front of a mirror in a room with good lighting. Put your hands down at your sides. Compare your breasts in the mirror. Look for any difference between them, such as: A difference in shape. A difference in size. Wrinkles, dips, and bumps in one breast and not the other. Look at each breast for changes in the skin, such as: Redness. Scaly areas. Skin that has gotten thicker. Dimpling. Open sores (ulcers). Look for changes in your nipples, such as: Fluid coming out of a nipple. Fluid around a nipple. Bleeding. Dimpling. Redness. A nipple that looks pushed in (retracted), or that has changed position. Feel for changes Lie on your back. Feel each breast. To do this: Pick a breast to feel. Place a pillow under the shoulder closest to that breast. Put the arm closest to that breast behind your head. Feel the nipple area of that breast using the hand of your other arm. Feel the area with the pads of your three middle fingers by making small circles with your fingers. Use light, medium, and firm pressure. Continue the overlapping circles, moving downward over the breast. Keep making circles with your fingers. Stop when you feel your ribs. Start making circles with your fingers again, this time going upward until you reach your collarbone. Then, make circles outward across your breast and into your  armpit area. Squeeze your nipple. Check for discharge and lumps. Repeat these steps to check your other breast. Sit or stand in the tub or shower. With soapy water on your skin, feel each breast the same way you did when you were lying down. Write down what you find Writing down what you find can help you remember what to tell your doctor. Write down: What is normal for each breast. Any changes you find in each breast. These include: The kind of changes you find. A tender or painful breast. Any lump you find. Write down its size and where it is. When you last had your monthly period (menstrual cycle). General tips If you are breastfeeding, the best time to check your breasts is after you feed your baby or after you use a breast pump. If you get monthly bleeding, the best time to check your breasts is 5-7 days after your monthly cycle ends. With time, you will become comfortable with the self-exam. You will also start to know if there are changes in your breasts. Contact a doctor if: You see a change in the shape or size of your breasts or nipples. You see a change in the skin of your breast or nipples, such as red or scaly skin. You have fluid coming from your nipples that is not normal. You find a new lump or thick area. You have breast pain. You have any concerns about your breast health. Summary Breast self-awareness includes looking for changes in your breasts and feeling for changes  within your breasts. You should do breast self-awareness in front of a mirror in a well-lit room. If you get monthly periods (menstrual cycles), the best time to check your breasts is 5-7 days after your period ends. Tell your doctor about any changes you see in your breasts. Changes include changes in size, changes on the skin, painful or tender breasts, or fluid from your nipples that is not normal. This information is not intended to replace advice given to you by your health care provider. Make sure  you discuss any questions you have with your health care provider. Document Revised: 01/24/2022 Document Reviewed: 06/21/2021 Elsevier Patient Education  2024 [employer].

## 2023-12-29 NOTE — Progress Notes (Signed)
 12/29/2023  History of Present Illness: Alexandria [name] is a 68 y.o. 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  Final pathology showed this to be stage Ia T1, N0 breast cancer.  She completed radiation and is currently on letrozole .  Patient reports that she has been doing well and denies any new complaints.  She reports some palpable scar tissue but otherwise denies any other palpable masses, or skin changes.  Past Medical History: Past Medical History:  Diagnosis Date   Anxiety    Breast cancer (HCC)    Cancer (HCC)    Carbuncle and furuncle of trunk    COPD (chronic obstructive pulmonary disease) (HCC)    Depression    Diabetes mellitus without complication (HCC)    Dyspnea    GERD (gastroesophageal reflux disease)    HTN (hypertension)    Hyperlipidemia    Intrinsic asthma, unspecified    Methicillin resistant Staphylococcus aureus in conditions classified elsewhere and of unspecified site    Other psychological or physical stress, not elsewhere classified(V62.89)    Personal history of radiation therapy    Pneumonia    Pre-diabetes    Tobacco abuse      Past Surgical History: Past Surgical History:  Procedure Laterality Date   BREAST BIOPSY Left 06/26/2022   stereo biopsy/ coil clip/ positive   BREAST LUMPECTOMY Left 07/17/2022   BREAST LUMPECTOMY,RADIO FREQ LOCALIZER,AXILLARY SENTINEL LYMPH NODE BIOPSY Left 07/17/2022   Procedure: BREAST LUMPECTOMY,RADIO FREQ LOCALIZER,AXILLARY SENTINEL LYMPH NODE BIOPSY;  Surgeon: Emmalene Hare, MD;  Location: [facility];  Service: General;  Laterality: Left;   COLONOSCOPY WITH PROPOFOL  N/A 04/10/2015   Procedure: COLONOSCOPY WITH PROPOFOL ;  Surgeon: Marnee Sink, MD;  Location: [facility];  Service: Endoscopy;  Laterality: N/A;   COLONOSCOPY WITH PROPOFOL  N/A 06/25/2021   Procedure: COLONOSCOPY WITH PROPOFOL ;  Surgeon: Marnee Sink, MD;   Location: [facility];  Service: Endoscopy;  Laterality: N/A;  leave at 8 arrival   ESOPHAGOGASTRODUODENOSCOPY (EGD) WITH PROPOFOL  N/A 12/06/2021   Procedure: ESOPHAGOGASTRODUODENOSCOPY (EGD) WITH PROPOFOL ;  Surgeon: Marnee Sink, MD;  Location: [facility];  Service: Endoscopy;  Laterality: N/A;   POLYPECTOMY  04/10/2015   Procedure: POLYPECTOMY;  Surgeon: Marnee Sink, MD;  Location: [facility];  Service: Endoscopy;;   POLYPECTOMY N/A 06/25/2021   Procedure: POLYPECTOMY;  Surgeon: Marnee Sink, MD;  Location: [facility];  Service: Endoscopy;  Laterality: N/A;   STATUS POST ENDOMETRIAL ABLATION     TONSILLECTOMY     TUBAL LIGATION     bilateral, sterile ablation   VARICOSE VEINS      Home Medications: Prior to Admission medications   Medication Sig Start Date End Date Taking? Authorizing Provider  acetaminophen  (TYLENOL ) 500 MG tablet Take 1,000 mg by mouth every 6 (six) hours as needed for mild pain.   Yes [provider]  albuterol  (VENTOLIN  HFA) 108 (90 Base) MCG/ACT inhaler Inhale 1-2 puffs into the lungs every 6 (six) hours as needed for wheezing or shortness of breath. 06/24/23  Yes Normie Becton, FNP  ALPRAZolam  (XANAX ) 0.25 MG tablet Take 1 tablet (0.25 mg total) by mouth 2 (two) times daily as needed for anxiety. Needs appt. with PCP for any further refills. 10/15/23  Yes Clifton, Kellie A, FNP  amLODipine  (NORVASC ) 10 MG tablet TAKE 1 TABLET BY MOUTH DAILY 11/24/23  Yes Rufus Council A, FNP  aspirin EC 81 MG tablet Take  81 mg by mouth daily. Swallow whole.   Yes [provider]  Calcium  Carb-Cholecalciferol (CALCIUM  PLUS VITAMIN D3) 600-20 MG-MCG TABS Take 1 tablet by mouth every Monday, Wednesday, and Friday.   Yes [provider]  cetirizine (ZYRTEC) 10 MG tablet Take 1 tablet (10 mg total) by mouth daily. 01/13/20  Yes Angelia Kelp, PA-C  famotidine  (PEPCID ) 20 MG tablet Take 20 mg by mouth as needed for  heartburn.   Yes [provider]  hydrochlorothiazide  (HYDRODIURIL ) 25 MG tablet TAKE 1 TABLET BY MOUTH DAILY 09/05/23  Yes Iona Manis T, FNP  letrozole  (FEMARA ) 2.5 MG tablet TAKE 1 TABLET BY MOUTH DAILY  STARTING 1 WEEK AFTER COMPLETION OF RADIATION 08/06/23  Yes Nelda Balsam, NP  lisinopril  (ZESTRIL ) 20 MG tablet Take 1 tablet (20 mg total) by mouth daily. 11/13/23  Yes Clifton, Kellie A, FNP  metFORMIN  (GLUCOPHAGE -XR) 750 MG 24 hr tablet TAKE 1 TABLET BY MOUTH DAILY  WITH BREAKFAST 08/25/23  Yes Iona Manis T, FNP  Multiple Vitamin (MULTIVITAMIN ADULT PO) Take 1 tablet by mouth daily.   Yes [provider]  PARoxetine  (PAXIL ) 10 MG tablet Take 1 tablet (10 mg total) by mouth daily. 07/15/23  Yes Iona Manis T, FNP  rosuvastatin  (CRESTOR ) 40 MG tablet TAKE 1 TABLET BY MOUTH DAILY 06/10/23  Yes Normie Becton, FNP  Spacer/Aero-Holding Chambers (AEROCHAMBER MV) inhaler Use as instructed 07/24/22  Yes Ethlyn Herd, MD  TRELEGY ELLIPTA  100-62.5-25 MCG/ACT AEPB USE 1 INHALATION BY MOUTH DAILY 02/17/23  Yes Normie Becton, FNP  fluticasone  (FLONASE ) 50 MCG/ACT nasal spray Place 2 sprays into both nostrils daily. 12/09/19 01/06/20  Angelia Kelp, PA-C    Allergies: Allergies  Allergen Reactions   Pravastatin Sodium     Myalgia    Review of Systems: Review of Systems  Constitutional:  Negative for chills and fever.  Respiratory:  Negative for shortness of breath.   Cardiovascular:  Negative for chest pain.  Gastrointestinal:  Negative for nausea and vomiting.  Skin:  Negative for rash.    Physical Exam BP (!) 162/69   Pulse 83   Temp 98.1 F (36.7 C) (Oral)   Ht 5\' 7"  (1.702 m)   Wt 161 lb (73 kg)   SpO2 95%   BMI 25.22 kg/m  CONSTITUTIONAL: No acute distress, well-nourished HEENT:  Normocephalic, atraumatic, extraocular motion intact. RESPIRATORY:  Normal respiratory effort without pathologic use of accessory muscles. CARDIOVASCULAR: Regular rhythm and  rate. BREAST: Left breast status postlumpectomy in the upper outer quadrant with scar well-healed.  The patient does have palpable skin changes that are consistent with radiation more noticeable at the nipple/areola and just inferior to it in the lower outer quadrant.  Otherwise no palpable masses, any other skin changes, or other nipple changes.  Left axilla without palpable lymphadenopathy and prior incision is well-healed.  Right breast without any palpable masses, skin changes, or nipple changes.  No right axillary lymphadenopathy. NEUROLOGIC:  Motor and sensation is grossly normal.  Cranial nerves are grossly intact. PSYCH:  Alert and oriented to person, place and time. Affect is normal.   Assessment and Plan: This is a 68 y.o. female status post left breast lumpectomy and sentinel lymph node biopsy.  - Schedule the patient that she is otherwise doing well after her surgery.  She does still have some radiation changes in the left breast which I think by now may not really fully improve.  She can continue using moisturizing lotion or  cocoa butter lotion to help.  Otherwise there is no new changes. - Patient will be due for repeat mammogram in about 6 months and she will follow-up with me after that. - All of her questions have been answered.  I spent 20 minutes dedicated to the care of this patient on the date of this encounter to include pre-visit review of records, face-to-face time with the patient discussing diagnosis and management, and any post-visit coordination of care.   Marene Shape, MD [facility]

## 2023-12-31 ENCOUNTER — Other Ambulatory Visit: Payer: Self-pay | Admitting: Pharmacist

## 2023-12-31 DIAGNOSIS — E1159 Type 2 diabetes mellitus with other circulatory complications: Secondary | ICD-10-CM

## 2023-12-31 NOTE — Progress Notes (Signed)
 12/31/2023 Name: LITZZY WAIN MRN: [id number] DOB: 23-Apr-1956  Chief Complaint  Patient presents with   Hypertension    Alexandria [name] is a 68 y.o. year old female who presented for a telephone visit.   They were referred to the pharmacist by their PCP for assistance in managing hypertension.   Referred for high BP management  Subjective:  Care Team: Primary Care Provider: Tasia Farr, FNP ; Next Scheduled Visit: 03/17/24 Clinical Pharmacist: Delvin File, PharmD  Medication Access/Adherence  Current Pharmacy:  [facility] - [place] - [address] [place] [address] Phone: [phone] Fax: [phone]  [facility] [address]), [place] - [address] [place]) [place] [address] Phone: [phone] Fax: [phone]   Patient reports affordability concerns with their medications: No  Patient reports access/transportation concerns to their pharmacy: No  Patient reports adherence concerns with their medications:  No     Hypertension:  Current medications: Amlodipine  10mg  daily, Hydrochlorothiazide  25mg  daily, Lisinopril  20mg  daily Medications previously tried: None  Patient has a validated, automated, upper arm home BP cuff BP readings from 12/29/23 office visit: 162/69 Current blood pressure readings readings from 3/13: 140/70's with HR 70-80 on average currently  Patient denies hypotensive s/sx including dizziness, lightheadedness.  Patient denies hypertensive symptoms including headache, chest pain, shortness of breath  Current meal patterns:  Breakfast 9AM: Bowl of cereal (Honey nut cheerios), occasional egg or toast Lunch: Banana sandwich, occasionally skips Dinner: Pork chops, chicken tenders, sandwich, green beans, garden peas; usually meat unless salad alone Drinks: Water , mini soda cans, 16 oz cup with 4-5/day; two 8-12 oz of Folgers half-caff  (around ~120mg  caffeine per day)  Current physical activity: Yard work and walking to [employer]   Objective:  Lab Results  Component Value Date   HGBA1C 6.1 (H) 10/24/2023    Lab Results  Component Value Date   CREATININE 0.74 10/24/2023   BUN 7 (L) 10/24/2023   NA 136 10/24/2023   K 3.7 10/24/2023   CL 93 (L) 10/24/2023   CO2 24 10/24/2023    Lab Results  Component Value Date   CHOL 130 10/24/2023   HDL 43 10/24/2023   LDLCALC 64 10/24/2023   TRIG 130 10/24/2023   CHOLHDL 3.0 10/24/2023    Medications Reviewed Today   Medications were not reviewed in this encounter       Assessment/Plan:   Hypertension: - Currently uncontrolled - Reviewed long term cardiovascular and renal outcomes of uncontrolled blood pressure - Reviewed appropriate blood pressure monitoring technique and reviewed goal blood pressure. Recommended to check home blood pressure and heart rate every other day - Recommend to remove extra cup of Folgers half-caff each day to make it around 60mg  of caffeine rather than 120mg    Follow Up Plan:  - Follow-up on 01/20/24 to review labs - INCREASE Lisinopril  to 40mg  daily- two tablets per day starting tomorrow - Get CMP in 2 weeks on 01/15/24- will see if able to obtain BP during lab check as well   - Waiting for Dr. Renay Carota to confirm placing labs and dose increase - Brother-in-law lives next door and helps take care of him daily - Reports being very stressed over the last month- no explanation for high BP at last office visit  *Letrozole  has known side effect of high BP- treating side effect currently    Delvin File, PharmD [facility]  Phone Number: [phone]

## 2024-01-01 ENCOUNTER — Telehealth: Payer: Self-pay

## 2024-01-01 NOTE — Telephone Encounter (Signed)
 Aaron Aas

## 2024-01-06 ENCOUNTER — Telehealth: Admitting: Family Medicine

## 2024-01-06 DIAGNOSIS — J441 Chronic obstructive pulmonary disease with (acute) exacerbation: Secondary | ICD-10-CM

## 2024-01-06 MED ORDER — PREDNISONE 20 MG PO TABS
40.0000 mg | ORAL_TABLET | Freq: Every day | ORAL | 0 refills | Status: AC
Start: 2024-01-06 — End: 2024-01-11

## 2024-01-06 NOTE — Patient Instructions (Addendum)
 Alexandria [name], thank you for joining Lanetta Pion, NP for today's virtual visit.  While this provider is not your primary care provider (PCP), if your PCP is located in our provider database this encounter information will be shared with them immediately following your visit.   A [facility] MyChart account gives you access to today's visit and all your visits, tests, and labs performed at [facility] " click here if you don't have a  MyChart account or go to [email or web address]  Consent: (Patient) Alexandria [name] provided verbal consent for this virtual visit at the beginning of the encounter.  Current Medications:  Current Outpatient Medications:    predniSONE  (DELTASONE ) 20 MG tablet, Take 2 tablets (40 mg total) by mouth daily with breakfast for 5 days., Disp: 10 tablet, Rfl: 0   acetaminophen  (TYLENOL ) 500 MG tablet, Take 1,000 mg by mouth every 6 (six) hours as needed for mild pain., Disp: , Rfl:    albuterol  (VENTOLIN  HFA) 108 (90 Base) MCG/ACT inhaler, Inhale 1-2 puffs into the lungs every 6 (six) hours as needed for wheezing or shortness of breath., Disp: 18 g, Rfl: 11   ALPRAZolam  (XANAX ) 0.25 MG tablet, Take 1 tablet (0.25 mg total) by mouth 2 (two) times daily as needed for anxiety. Needs appt. with PCP for any further refills., Disp: 60 tablet, Rfl: 0   amLODipine  (NORVASC ) 10 MG tablet, TAKE 1 TABLET BY MOUTH DAILY, Disp: 100 tablet, Rfl: 1   aspirin EC 81 MG tablet, Take 81 mg by mouth daily. Swallow whole., Disp: , Rfl:    Calcium  Carb-Cholecalciferol (CALCIUM  PLUS VITAMIN D3) 600-20 MG-MCG TABS, Take 1 tablet by mouth every Monday, Wednesday, and Friday., Disp: , Rfl:    cetirizine (ZYRTEC) 10 MG tablet, Take 1 tablet (10 mg total) by mouth daily., Disp: 30 tablet, Rfl: 11   famotidine  (PEPCID ) 20 MG tablet, Take 20 mg by mouth as needed for heartburn., Disp: , Rfl:    hydrochlorothiazide  (HYDRODIURIL ) 25 MG tablet, TAKE 1 TABLET BY MOUTH DAILY, Disp: 100  tablet, Rfl: 2   letrozole  (FEMARA ) 2.5 MG tablet, TAKE 1 TABLET BY MOUTH DAILY  STARTING 1 WEEK AFTER COMPLETION OF RADIATION, Disp: 100 tablet, Rfl: 2   lisinopril  (ZESTRIL ) 20 MG tablet, Take 1 tablet (20 mg total) by mouth daily., Disp: 100 tablet, Rfl: 0   metFORMIN  (GLUCOPHAGE -XR) 750 MG 24 hr tablet, TAKE 1 TABLET BY MOUTH DAILY  WITH BREAKFAST, Disp: 100 tablet, Rfl: 1   Multiple Vitamin (MULTIVITAMIN ADULT PO), Take 1 tablet by mouth daily., Disp: , Rfl:    PARoxetine  (PAXIL ) 10 MG tablet, Take 1 tablet (10 mg total) by mouth daily., Disp: 90 tablet, Rfl: 3   rosuvastatin  (CRESTOR ) 40 MG tablet, TAKE 1 TABLET BY MOUTH DAILY, Disp: 90 tablet, Rfl: 3   Spacer/Aero-Holding Chambers (AEROCHAMBER MV) inhaler, Use as instructed, Disp: 1 each, Rfl: 1   TRELEGY ELLIPTA  100-62.5-25 MCG/ACT AEPB, USE 1 INHALATION BY MOUTH DAILY, Disp: 180 each, Rfl: 3   Medications ordered in this encounter:  Meds ordered this encounter  Medications   predniSONE  (DELTASONE ) 20 MG tablet    Sig: Take 2 tablets (40 mg total) by mouth daily with breakfast for 5 days.    Dispense:  10 tablet    Refill:  0    Supervising Provider:   Corine Dice [phone]     *If you need refills on other medications prior to your next appointment, please contact your pharmacy*  Follow-Up:  Call back or seek an in-person evaluation if the symptoms worsen or if the condition fails to improve as anticipated.  [facility] [phone]  Other Instructions  Please be seen if you do not improve or develop the symptoms we discussed    If you have been instructed to have an in-person evaluation today at a local Urgent Care facility, please use the link below. It will take you to a list of all of our available [facility], including address, phone number and hours of operation. Please do not delay care.  [facility]  If you or a family member do not have a primary care provider, use  the link below to schedule a visit and establish care. When you choose a [facility] primary care physician or advanced practice provider, you gain a long-term partner in health. Find a Primary Care Provider  Learn more about [facility] [place]'s in-office and virtual care options: [place] [facility] Get Care Now

## 2024-01-06 NOTE — Progress Notes (Signed)
 Virtual Visit Consent   Alexandria [name], you are scheduled for a virtual visit with a [facility] provider today. Just as with appointments in the office, your consent must be obtained to participate. Your consent will be active for this visit and any virtual visit you may have with one of our providers in the next 365 days. If you have a MyChart account, a copy of this consent can be sent to you electronically.  As this is a virtual visit, video technology does not allow for your provider to perform a traditional examination. This may limit your provider's ability to fully assess your condition. If your provider identifies any concerns that need to be evaluated in person or the need to arrange testing (such as labs, EKG, etc.), we will make arrangements to do so. Although advances in technology are sophisticated, we cannot ensure that it will always work on either your end or our end. If the connection with a video visit is poor, the visit may have to be switched to a telephone visit. With either a video or telephone visit, we are not always able to ensure that we have a secure connection.  By engaging in this virtual visit, you consent to the provision of healthcare and authorize for your insurance to be billed (if applicable) for the services provided during this visit. Depending on your insurance coverage, you may receive a charge related to this service.  I need to obtain your verbal consent now. Are you willing to proceed with your visit today? Alexandria [name] has provided verbal consent on 01/06/2024 for a virtual visit (video or telephone). Alexandria Pion, NP  Date: 01/06/2024 11:02 AM   Virtual Visit via Video Note   I, Alexandria [name], connected with  Alexandria [name]  ([id number], 09/10/1955) on 01/06/24 at 11:00 AM EDT by a video-enabled telemedicine application and verified that I am speaking with the correct person using two identifiers.  Location: Patient: Virtual Visit Location Patient: Home Provider:  Virtual Visit Location Provider: Home Office   I discussed the limitations of evaluation and management by telemedicine and the availability of in person appointments. The patient expressed understanding and agreed to proceed.    History of Present Illness: Alexandria [name] is a 68 y.o. who identifies as a female who was assigned female at birth, and is being seen today for shortness of breath  Having increased shortness of breath- with clear mucus on Sunday. Mild coughing- nothing outside normal level of coughing.  At rest oxygen stating at 97-98, 98-99% when moving around. HR 72-78 Has been hospitalized before with this due to a COPD flare.  Using zyrtec - but has missed two days.  Denies chest pain, fevers, and chills.  No known sick contacts. Current smoker - half a pack a day or so  Problems:  Patient Active Problem List   Diagnosis Date Noted   Psychophysiological insomnia 07/15/2023   Mood disorder (HCC) 06/17/2023   Tobacco dependence 04/16/2023   DM type 2, goal HbA1c < 7% (HCC) 04/16/2023   Long term current use of aromatase inhibitor 08/08/2022   Diabetes mellitus (HCC) 07/28/2022   Breast cancer, left (HCC) 07/01/2022   Annual physical exam 04/11/2022   Hypertension associated with diabetes (HCC) 04/11/2022   Hyperlipidemia associated with type 2 diabetes mellitus (HCC) 04/11/2022   History of colonic polyps     Allergies:  Allergies  Allergen Reactions   Pravastatin Sodium     Myalgia   Medications:  Current Outpatient Medications:    acetaminophen  (TYLENOL ) 500 MG tablet, Take 1,000 mg by mouth every 6 (six) hours as needed for mild pain., Disp: , Rfl:    albuterol  (VENTOLIN  HFA) 108 (90 Base) MCG/ACT inhaler, Inhale 1-2 puffs into the lungs every 6 (six) hours as needed for wheezing or shortness of breath., Disp: 18 g, Rfl: 11   ALPRAZolam  (XANAX ) 0.25 MG tablet, Take 1 tablet (0.25 mg total) by mouth 2 (two) times daily as needed for anxiety. Needs appt. with PCP  for any further refills., Disp: 60 tablet, Rfl: 0   amLODipine  (NORVASC ) 10 MG tablet, TAKE 1 TABLET BY MOUTH DAILY, Disp: 100 tablet, Rfl: 1   aspirin EC 81 MG tablet, Take 81 mg by mouth daily. Swallow whole., Disp: , Rfl:    Calcium  Carb-Cholecalciferol (CALCIUM  PLUS VITAMIN D3) 600-20 MG-MCG TABS, Take 1 tablet by mouth every Monday, Wednesday, and Friday., Disp: , Rfl:    cetirizine (ZYRTEC) 10 MG tablet, Take 1 tablet (10 mg total) by mouth daily., Disp: 30 tablet, Rfl: 11   famotidine  (PEPCID ) 20 MG tablet, Take 20 mg by mouth as needed for heartburn., Disp: , Rfl:    hydrochlorothiazide  (HYDRODIURIL ) 25 MG tablet, TAKE 1 TABLET BY MOUTH DAILY, Disp: 100 tablet, Rfl: 2   letrozole  (FEMARA ) 2.5 MG tablet, TAKE 1 TABLET BY MOUTH DAILY  STARTING 1 WEEK AFTER COMPLETION OF RADIATION, Disp: 100 tablet, Rfl: 2   lisinopril  (ZESTRIL ) 20 MG tablet, Take 1 tablet (20 mg total) by mouth daily., Disp: 100 tablet, Rfl: 0   metFORMIN  (GLUCOPHAGE -XR) 750 MG 24 hr tablet, TAKE 1 TABLET BY MOUTH DAILY  WITH BREAKFAST, Disp: 100 tablet, Rfl: 1   Multiple Vitamin (MULTIVITAMIN ADULT PO), Take 1 tablet by mouth daily., Disp: , Rfl:    PARoxetine  (PAXIL ) 10 MG tablet, Take 1 tablet (10 mg total) by mouth daily., Disp: 90 tablet, Rfl: 3   rosuvastatin  (CRESTOR ) 40 MG tablet, TAKE 1 TABLET BY MOUTH DAILY, Disp: 90 tablet, Rfl: 3   Spacer/Aero-Holding Chambers (AEROCHAMBER MV) inhaler, Use as instructed, Disp: 1 each, Rfl: 1   TRELEGY ELLIPTA  100-62.5-25 MCG/ACT AEPB, USE 1 INHALATION BY MOUTH DAILY, Disp: 180 each, Rfl: 3  Observations/Objective: Patient is well-developed, well-nourished in no acute distress.  Resting comfortably  at home.  Head is normocephalic, atraumatic.  No labored breathing.  Speech is clear and coherent with logical content.  Patient is alert and oriented at baseline.    Assessment and Plan:  1. COPD with acute exacerbation (HCC) (Primary)  - predniSONE  (DELTASONE ) 20 MG tablet;  Take 2 tablets (40 mg total) by mouth daily with breakfast for 5 days.  Dispense: 10 tablet; Refill: 0  -start of flare- still having well controlled oxygen and HR -no red flags  -strict in person precautions reviewed  Reviewed side effects, risks and benefits of medication.    Patient acknowledged agreement and understanding of the plan.   Past Medical, Surgical, Social History, Allergies, and Medications have been Reviewed.    Follow Up Instructions: I discussed the assessment and treatment plan with the patient. The patient was provided an opportunity to ask questions and all were answered. The patient agreed with the plan and demonstrated an understanding of the instructions.  A copy of instructions were sent to the patient via MyChart unless otherwise noted below.     The patient was advised to call back or seek an in-person evaluation if the symptoms worsen or if the condition fails to  improve as anticipated.    Alexandria Pion, NP

## 2024-01-14 ENCOUNTER — Telehealth: Payer: Self-pay | Admitting: Family Medicine

## 2024-01-14 DIAGNOSIS — J449 Chronic obstructive pulmonary disease, unspecified: Secondary | ICD-10-CM

## 2024-01-14 MED ORDER — TRELEGY ELLIPTA 100-62.5-25 MCG/ACT IN AEPB: INHALATION_SPRAY | RESPIRATORY_TRACT | 0 refills | Status: DC

## 2024-01-14 NOTE — Telephone Encounter (Signed)
 [facility] is requesting refill TRELEGY ELLIPTA  100-62.5-25 MCG/ACT AEPB  Please advise

## 2024-01-15 ENCOUNTER — Other Ambulatory Visit: Payer: Self-pay | Admitting: Physician Assistant

## 2024-01-16 LAB — COMPREHENSIVE METABOLIC PANEL WITH GFR
ALT: 10 IU/L (ref 0–32)
AST: 13 IU/L (ref 0–40)
Albumin: 4.1 g/dL (ref 3.9–4.9)
Alkaline Phosphatase: 105 IU/L (ref 44–121)
BUN/Creatinine Ratio: 13 (ref 12–28)
BUN: 10 mg/dL (ref 8–27)
Bilirubin Total: 0.2 mg/dL (ref 0.0–1.2)
CO2: 23 mmol/L (ref 20–29)
Calcium: 10.1 mg/dL (ref 8.7–10.3)
Chloride: 97 mmol/L (ref 96–106)
Creatinine, Ser: 0.78 mg/dL (ref 0.57–1.00)
Globulin, Total: 2 g/dL (ref 1.5–4.5)
Glucose: 119 mg/dL — ABNORMAL HIGH (ref 70–99)
Potassium: 3.9 mmol/L (ref 3.5–5.2)
Sodium: 138 mmol/L (ref 134–144)
Total Protein: 6.1 g/dL (ref 6.0–8.5)
eGFR: 83 mL/min/{1.73_m2} (ref >59)

## 2024-01-19 ENCOUNTER — Ambulatory Visit: Payer: Self-pay | Admitting: Physician Assistant

## 2024-01-20 ENCOUNTER — Other Ambulatory Visit: Payer: Self-pay | Admitting: Pharmacist

## 2024-01-20 VITALS — BP 124/75 | HR 75

## 2024-01-20 DIAGNOSIS — E1159 Type 2 diabetes mellitus with other circulatory complications: Secondary | ICD-10-CM

## 2024-01-20 MED ORDER — LISINOPRIL 40 MG PO TABS: 40.0000 mg | ORAL_TABLET | Freq: Every day | ORAL | 1 refills | Status: DC

## 2024-01-20 NOTE — Progress Notes (Signed)
 01/20/2024 Name: Alexandria [name] MRN: [id number] DOB: 05/08/1956  Chief Complaint  Patient presents with   Hypertension    Alexandria [name] is a 68 y.o. year old female who presented for a telephone visit.   They were referred to the pharmacist by their PCP for assistance in managing hypertension.   Referred for high BP management  Subjective:  Care Team: Primary Care Provider: Tasia Farr, FNP ; Next Scheduled Visit: 03/17/24 Clinical Pharmacist: Delvin File, PharmD  Medication Access/Adherence  Current Pharmacy:  [facility] - [place], [place] - [address] [place] [address] Phone: [phone] Fax: [phone]  [facility] [address]), [place] - [address] [place]) [place] [address] Phone: [phone] Fax: [phone]   Patient reports affordability concerns with their medications: No  Patient reports access/transportation concerns to their pharmacy: No  Patient reports adherence concerns with their medications:  No     Hypertension:  Current medications: Amlodipine  10mg  daily, Hydrochlorothiazide  25mg  daily, Lisinopril  40mg  daily Medications previously tried: None  Patient has a validated, automated, upper arm home BP cuff  BP readings from 01/20/24 home checks: 124/75 + HR 75, 118/71 + HR 69 BP readings from 12/29/23 office visit: 162/69 Current blood pressure readings readings from 3/13: 140/70's with HR 70-80 on average currently  Patient denies hypotensive s/sx including dizziness, lightheadedness.  Patient denies hypertensive symptoms including headache, chest pain, shortness of breath  Current meal patterns:  Breakfast 9AM: Bowl of cereal (Honey nut cheerios), occasional egg or toast Lunch: Banana sandwich, occasionally skips Dinner: Pork chops, chicken tenders, sandwich, green beans, garden peas; usually meat unless salad alone Drinks: Water ,  mini soda cans, 16 oz cup with 4-5/day; two 8-12 oz of Folgers half-caff (around ~120mg  caffeine per day)  Current physical activity: Yard work and walking to [employer]   Objective:  Lab Results  Component Value Date   HGBA1C 6.1 (H) 10/24/2023    Lab Results  Component Value Date   CREATININE 0.78 01/15/2024   BUN 10 01/15/2024   NA 138 01/15/2024   K 3.9 01/15/2024   CL 97 01/15/2024   CO2 23 01/15/2024    Lab Results  Component Value Date   CHOL 130 10/24/2023   HDL 43 10/24/2023   LDLCALC 64 10/24/2023   TRIG 130 10/24/2023   CHOLHDL 3.0 10/24/2023    Medications Reviewed Today   Medications were not reviewed in this encounter       Assessment/Plan:   Hypertension: - Currently uncontrolled - Reviewed long term cardiovascular and renal outcomes of uncontrolled blood pressure - Reviewed appropriate blood pressure monitoring technique and reviewed goal blood pressure. Recommended to check home blood pressure and heart rate every other day - Recommend to remove extra cup of Folgers half-caff each day to make it around 60mg  of caffeine rather than 120mg    Follow Up Plan:  - Follow-up on 02/17/24 for last med check before [name] returns and BP review with Oncology soon after - BP looks great at home since dose increase to Lisinopril  40mg  daily- sending new Rx to continue  - Felt dizzy with dose increase when starting but doing well now  - Labs looked good at well - Needs refill on Xanax - send message to [facility] - Will request Albuterol  refill from [facility] along with other meds as needed- should all have refills - Brother-in-law lives next door and helps take care of  him daily   *Letrozole  has known side effect of high BP- treating side effect currently    Delvin File, PharmD [facility]  Phone Number: [phone]

## 2024-01-21 ENCOUNTER — Other Ambulatory Visit: Payer: Self-pay | Admitting: Family Medicine

## 2024-01-21 DIAGNOSIS — F39 Unspecified mood [affective] disorder: Secondary | ICD-10-CM

## 2024-01-30 ENCOUNTER — Telehealth: Payer: Self-pay | Admitting: Family Medicine

## 2024-01-30 MED ORDER — METFORMIN HCL ER 750 MG PO TB24: 750.0000 mg | ORAL_TABLET | Freq: Every day | ORAL | 1 refills | Status: DC

## 2024-01-30 NOTE — Telephone Encounter (Signed)
 [facility] faxed refill request for the following medications:   metFORMIN (GLUCOPHAGE-XR) 750 MG 24 hr tablet    Please advise.

## 2024-02-17 ENCOUNTER — Other Ambulatory Visit: Payer: Self-pay | Admitting: Pharmacist

## 2024-02-17 NOTE — Progress Notes (Signed)
   02/17/2024 Name: DESTINIE THORNSBERRY MRN: [id number] DOB: [birth year]  Chief Complaint  Patient presents with   Hypertension    Alexandria [name] is a 68 y.o. year old female who presented for a telephone visit.   They were referred to the pharmacist by their PCP for assistance in managing hypertension.   Referred for high BP management  Subjective:  Care Team: Primary Care Provider: Tasia Farr, FNP ; Next Scheduled Visit: 03/17/24 Clinical Pharmacist: Delvin File, PharmD  Medication Access/Adherence  Current Pharmacy:  [facility] - [place], [place] - [address] [place]  [address] Phone: [phone] Fax: [phone]  [facility] [address]), [place] - [address] [place]) [place] [address] Phone: [phone] Fax: [phone]   Patient reports affordability concerns with their medications: No  Patient reports access/transportation concerns to their pharmacy: No  Patient reports adherence concerns with their medications:  No     Hypertension:  Current medications: Amlodipine  10mg  daily, Hydrochlorothiazide  25mg  daily, Lisinopril  40mg  daily Medications previously tried: None  Patient has a validated, automated, upper arm home BP cuff  BP readings from 02/17/24 home checks: 120-130's/70's BP readings from 01/20/24 home checks: 124/75 + HR 75, 118/71 + HR 69 BP readings from 12/29/23 office visit: 162/69 Current blood pressure readings readings from 3/13: 140/70's with HR 70-80 on average currently  Patient denies hypotensive s/sx including dizziness, lightheadedness.  Patient denies hypertensive symptoms including headache, chest pain, shortness of breath  Current meal patterns:  Breakfast 9AM: Bowl of cereal (Honey nut cheerios), occasional egg or toast Lunch: Banana sandwich, occasionally skips Dinner: Pork chops, chicken tenders, sandwich, green beans, garden  peas; usually meat unless salad alone Drinks: Water , mini soda cans, 16 oz cup with 4-5/day; two 8-12 oz of Folgers half-caff (around ~120mg  caffeine per day)  Current physical activity: Yard work and walking to [employer]   Objective:  Lab Results  Component Value Date   HGBA1C 6.1 (H) 10/24/2023    Lab Results  Component Value Date   CREATININE 0.78 01/15/2024   BUN 10 01/15/2024   NA 138 01/15/2024   K 3.9 01/15/2024   CL 97 01/15/2024   CO2 23 01/15/2024    Lab Results  Component Value Date   CHOL 130 10/24/2023   HDL 43 10/24/2023   LDLCALC 64 10/24/2023   TRIG 130 10/24/2023   CHOLHDL 3.0 10/24/2023    Medications Reviewed Today   Medications were not reviewed in this encounter       Assessment/Plan:   Hypertension: - Currently controlled - Reviewed long term cardiovascular and renal outcomes of uncontrolled blood pressure - Reviewed appropriate blood pressure monitoring technique and reviewed goal blood pressure. Recommended to check home blood pressure and heart rate every other day - Recommend to remove extra cup of Folgers half-caff each day to make it around 60mg  of caffeine rather than 120mg    Follow Up Plan:  - No follow-up needed- will set task for 6/17 visit to see how BP was and will only call again if needed as hypertension seems well controlled - Continue medications as prescribed and no new concerns at this time - Brother-in-law lives next door and helps take care of him daily   *Letrozole  has known side effect of high BP- treating side effect currently    Delvin File, PharmD [facility]  Phone Number: [phone]

## 2024-02-25 DIAGNOSIS — H169 Unspecified keratitis: Secondary | ICD-10-CM | POA: Diagnosis not present

## 2024-02-27 ENCOUNTER — Other Ambulatory Visit: Payer: Self-pay

## 2024-02-27 DIAGNOSIS — Z170 Estrogen receptor positive status [ER+]: Secondary | ICD-10-CM

## 2024-03-01 ENCOUNTER — Encounter: Payer: Self-pay | Admitting: Oncology

## 2024-03-01 ENCOUNTER — Ambulatory Visit: Payer: 59 | Admitting: Internal Medicine

## 2024-03-01 ENCOUNTER — Inpatient Hospital Stay: Attending: Oncology

## 2024-03-01 ENCOUNTER — Inpatient Hospital Stay (HOSPITAL_BASED_OUTPATIENT_CLINIC_OR_DEPARTMENT_OTHER): Admitting: Oncology

## 2024-03-01 ENCOUNTER — Other Ambulatory Visit: Payer: 59

## 2024-03-01 VITALS — BP 140/67 | HR 75 | Temp 96.7°F | Resp 18

## 2024-03-01 DIAGNOSIS — Z720 Tobacco use: Secondary | ICD-10-CM | POA: Diagnosis not present

## 2024-03-01 DIAGNOSIS — Z5181 Encounter for therapeutic drug level monitoring: Secondary | ICD-10-CM | POA: Diagnosis not present

## 2024-03-01 DIAGNOSIS — Z1722 Progesterone receptor negative status: Secondary | ICD-10-CM | POA: Diagnosis not present

## 2024-03-01 DIAGNOSIS — C50412 Malignant neoplasm of upper-outer quadrant of left female breast: Secondary | ICD-10-CM | POA: Diagnosis not present

## 2024-03-01 DIAGNOSIS — Z79811 Long term (current) use of aromatase inhibitors: Secondary | ICD-10-CM | POA: Insufficient documentation

## 2024-03-01 DIAGNOSIS — F17210 Nicotine dependence, cigarettes, uncomplicated: Secondary | ICD-10-CM | POA: Insufficient documentation

## 2024-03-01 DIAGNOSIS — Z1732 Human epidermal growth factor receptor 2 negative status: Secondary | ICD-10-CM | POA: Diagnosis not present

## 2024-03-01 DIAGNOSIS — C50912 Malignant neoplasm of unspecified site of left female breast: Secondary | ICD-10-CM | POA: Insufficient documentation

## 2024-03-01 DIAGNOSIS — Z170 Estrogen receptor positive status [ER+]: Secondary | ICD-10-CM | POA: Insufficient documentation

## 2024-03-01 DIAGNOSIS — Z122 Encounter for screening for malignant neoplasm of respiratory organs: Secondary | ICD-10-CM | POA: Diagnosis not present

## 2024-03-01 LAB — CBC WITH DIFFERENTIAL (CANCER CENTER ONLY)
Abs Immature Granulocytes: 0.02 10*3/uL (ref 0.00–0.07)
Basophils Absolute: 0.1 10*3/uL (ref 0.0–0.1)
Basophils Relative: 1 %
Eosinophils Absolute: 0.1 10*3/uL (ref 0.0–0.5)
Eosinophils Relative: 1 %
HCT: 44.6 % (ref 36.0–46.0)
Hemoglobin: 14.8 g/dL (ref 12.0–15.0)
Immature Granulocytes: 0 %
Lymphocytes Relative: 26 %
Lymphs Abs: 2 10*3/uL (ref 0.7–4.0)
MCH: 28.7 pg (ref 26.0–34.0)
MCHC: 33.2 g/dL (ref 30.0–36.0)
MCV: 86.4 fL (ref 80.0–100.0)
Monocytes Absolute: 0.4 10*3/uL (ref 0.1–1.0)
Monocytes Relative: 5 %
Neutro Abs: 5.3 10*3/uL (ref 1.7–7.7)
Neutrophils Relative %: 67 %
Platelet Count: 313 10*3/uL (ref 150–400)
RBC: 5.16 MIL/uL — ABNORMAL HIGH (ref 3.87–5.11)
RDW: 13.3 % (ref 11.5–15.5)
WBC Count: 7.9 10*3/uL (ref 4.0–10.5)
nRBC: 0 % (ref 0.0–0.2)

## 2024-03-01 LAB — CMP (CANCER CENTER ONLY)
ALT: 13 U/L (ref 0–44)
AST: 16 U/L (ref 15–41)
Albumin: 4.2 g/dL (ref 3.5–5.0)
Alkaline Phosphatase: 95 U/L (ref 38–126)
Anion gap: 9 (ref 5–15)
BUN: 10 mg/dL (ref 8–23)
CO2: 27 mmol/L (ref 22–32)
Calcium: 10 mg/dL (ref 8.9–10.3)
Chloride: 94 mmol/L — ABNORMAL LOW (ref 98–111)
Creatinine: 0.66 mg/dL (ref 0.44–1.00)
GFR, Estimated: >60 mL/min (ref >60)
Glucose, Bld: 103 mg/dL — ABNORMAL HIGH (ref 70–99)
Potassium: 4.1 mmol/L (ref 3.5–5.1)
Sodium: 130 mmol/L — ABNORMAL LOW (ref 135–145)
Total Bilirubin: 0.6 mg/dL (ref 0.0–1.2)
Total Protein: 7.1 g/dL (ref 6.5–8.1)

## 2024-03-01 MED ORDER — LETROZOLE 2.5 MG PO TABS: 2.5000 mg | ORAL_TABLET | Freq: Every day | ORAL | 2 refills | Status: DC

## 2024-03-01 NOTE — Progress Notes (Addendum)
 [name] CONSULT NOTE  Patient Care Team: Alexandria Curtis LABOR, FNP as PCP - General (Family Medicine) Georgina Shasta POUR, RN as Oncology Nurse Navigator Lenn Aran, MD as Consulting Physician (Radiation Oncology) Desiderio Schanz, MD as Consulting Physician (General Surgery) Babara Call, MD as Consulting Physician (Oncology)   CANCER STAGING   Cancer Staging  Breast cancer, left [facility]) Staging form: Breast, AJCC 8th Edition - Clinical stage from 06/26/2022: Stage IA (cT1b, cN0, cM0, G2, ER+, PR-, HER2-) - Signed by Agrawal, Kavita, MD on 07/01/2022 Stage prefix: Initial diagnosis Method of lymph node assessment: Clinical Nuclear grade: G2 Mitotic count score: Score 1 Histologic grading system: 3 grade system   ASSESSMENT & PLAN:  Alexandria [name] 68 y.o. 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  Stage Ia 07/17/2022 status post lumpectomy with sentinel lymph node biopsy.  Oncotype DX showed recurrence score of 18.  No adjuvant chemotherapy. 09/26/2022 status post adjuvant radiation to left breast. 10/03/2022 endocrine therapy with letrozole  2.5 mg daily.  Plan for 5 years. Overall she tolerates well.  Manageable hot flashes.  Recommend annual mammogram-October 2025 Patient declined genetic testing.  Long term current use of aromatase inhibitor Recommend calcium  and vitamin D supplementation. 09/16/2022 DEXA showed normal bone density.  Plan to repeat in 2 years.  Tobacco use Recommend smoke cessation. Obtain CT chest without contrast lung cancer screening.  Orders Placed This Encounter  Procedures   CT Chest Wo Contrast    Standing Status:   Future    Expected Date:   03/08/2024     Expiration Date:   03/01/2025    Preferred imaging location?:   [facility]   MM 3D DIAGNOSTIC MAMMOGRAM BILATERAL BREAST    Standing Status:   Future    Expected Date:   06/23/2024    Expiration Date:   03/01/2025    Reason for Exam (SYMPTOM  OR DIAGNOSIS REQUIRED):   breast cancer follow up    Preferred imaging location?:   [facility]   DG Bone Density    Standing Status:   Future    Expected Date:   09/14/2024    Expiration Date:   03/01/2025    Reason for Exam (SYMPTOM  OR DIAGNOSIS REQUIRED):   AI use, breast cancer follow up    Preferred imaging location?:   [facility]   CMP (Cancer [name] only)    Standing Status:   Future    Expected Date:   09/15/2024    Expiration Date:   12/14/2024   CBC with Differential ([name] Only)    Standing Status:   Future    Expected Date:   09/15/2024    Expiration Date:   12/14/2024   Follow-up in 6 months. All questions were answered. The patient knows to call the clinic with any problems, questions or concerns.  Call Babara, MD, PhD [name]ology Oncology 03/01/2024      HISTORY OF PRESENTING ILLNESS:  Alexandria [name] 68 y.o. 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  INTERVAL HISTORY-  Patient seen today as follow  up for stage IA left breast cancer Patient previously followed up with Dr.Agrawal. Patient switched care to me on  Extensive medical record review was performed by me  I have reviewed her chart and materials related to her cancer extensively and collaborated history with the patient. Summary of oncologic history is as follows: Oncology History  Breast cancer, left (HCC)  05/15/2022 Mammogram   Screening mammogram-  FINDINGS: In the left breast, a possible asymmetry and distortion with calcifications warrant further evaluation.  In the right breast, no findings suspicious for malignancy.   IMPRESSION: Further evaluation is suggested for possible asymmetry and distortion with calcifications in the left breast.     06/06/2022 Mammogram   Diagnostic mammogram and US  IMPRESSION: 1. Left breast architectural distortion in the upper-outer quadrant posterior depth, without a sonographic correlate, is intermediate suspicion for malignancy. 2. Previously described asymmetry in the inner left breast does not persist with additional views, consistent with superimposed fibroglandular tissue. 3. Incidentally visualized 3 mm benign simple cyst in the left breast 6 o'clock position.   RECOMMENDATION: Left breast stereotactic guided biopsy of the architectural distortion in the upper-outer quadrant (1 site).  US  left axilla normal    06/26/2022 Pathology Results   DIAGNOSIS:  A. BREAST, LEFT UPPER OUTER QUADRANT, POSTERIOR DEPTH; STEREOTACTIC CORE  NEEDLE BIOPSY:  - INVASIVE MAMMARY CARCINOMA, NO SPECIAL TYPE.  - CALCIFICATIONS ASSOCIATED WITH BENIGN AND NEOPLASTIC MAMMARY ELEMENTS.   Size of invasive carcinoma: 5 mm in this sample  Histologic grade of invasive carcinoma: Grade 2                       Glandular/tubular differentiation score: 3                       Nuclear pleomorphism score: 2                       Mitotic rate score: 1                       Total score: 6  Ductal carcinoma in situ: Present, grade 2-3, with focal comedonecrosis  Lymphovascular invasion: Not identified   ADDENDUM:  CASE SUMMARY: BREAST BIOMARKER TESTS  Estrogen Receptor (ER) Status: POSITIVE          Percentage of cells with nuclear positivity: Greater than 90%          Average intensity of staining: Strong   Progesterone Receptor (PgR) Status: NEGATIVE (LESS THAN 1%)          Internal control cells present and stain as expected   HER2 (by immunohistochemistry): NEGATIVE (Score 1+)  Ki-67: Not performed     07/17/2022  Definitive Surgery   Lumpectomy with SLNB with Dr. Desiderio   TUMOR Histologic Type: Invasive carcinoma of no special type Histologic Grade (Nottingham Histologic Score)      Glandular (Acinar)/Tubular Differentiation: 3      Nuclear Pleomorphism: 2      Mitotic Rate: 1      Overall Grade: Grade 2 Tumor Size: 5 mm Tumor Focality: Single focus of invasive carcinoma Ductal Carcinoma In Situ (DCIS): Present, intermediate grade Tumor Extent: Not applicable Lymphatic and/or Vascular Invasion: Not identified Treatment Effect in the Breast: No known presurgical therapy  MARGINS Margin Status for Invasive Carcinoma: All margins negative for invasive carcinoma      Distance from closest margin: At least 5 mm  Specify closest margin: All surgical margins  Margin Status for DCIS: All margins negative for DCIS      Distance from DCIS to closest margin: At least 5 mm      Specify closest margin: All surgical margins  REGIONAL LYMPH NODES Regional Lymph Node Status: All regional lymph nodes negative for tumor      Total Number of Lymph Nodes Examined (sentinel and non-sentinel): 3       Number of Sentinel Nodes Examined: 3  DISTANT METASTASIS Distant Site(s) Involved, if applicable: Not applicable  PATHOLOGIC STAGE CLASSIFICATION (pTNM, AJCC 8th Edition): Modified Classification: Not applicable pT Category: pT1a T Suffix: Not applicable pN Category: pN0 N Suffix: sn pM Category: Not applicable  SPECIAL STUDIES Breast Biomarker Testing Performed on Previous Biopsy: [id number] Estrogen Receptor (ER) Status: POSITIVE         Percentage of cells with nuclear positivity: Greater than 90%         Average intensity of staining: Strong  Progesterone Receptor (PgR) Status: NEGATIVE (LESS THAN 1%)         Internal control cells present and stain as expected  HER2 (by immunohistochemistry): NEGATIVE (Score 1+) Ki-67: Not performed    10/03/2022 -  Anti-estrogen oral therapy   Started  on letrozole  2.5 mg daily.    Patient reports tolerating letrozole  2.5 mg daily.  Manageable hot flashes. Has no new concerns today.  MEDICAL HISTORY:  Past Medical History:  Diagnosis Date   Anxiety    Breast cancer (HCC)    Cancer (HCC)    Carbuncle and furuncle of trunk    COPD (chronic obstructive pulmonary disease) (HCC)    Depression    Diabetes mellitus without complication (HCC)    Dyspnea    GERD (gastroesophageal reflux disease)    HTN (hypertension)    Hyperlipidemia    Intrinsic asthma, unspecified    Methicillin resistant Staphylococcus aureus in conditions classified elsewhere and of unspecified site    Other psychological or physical stress, not elsewhere classified(V62.89)    Personal history of radiation therapy    Pneumonia    Pre-diabetes    Tobacco abuse     SURGICAL HISTORY: Past Surgical History:  Procedure Laterality Date   BREAST BIOPSY Left 06/26/2022   stereo biopsy/ coil clip/ positive   BREAST LUMPECTOMY Left 07/17/2022   BREAST LUMPECTOMY,RADIO FREQ LOCALIZER,AXILLARY SENTINEL LYMPH NODE BIOPSY Left 07/17/2022   Procedure: BREAST LUMPECTOMY,RADIO FREQ LOCALIZER,AXILLARY SENTINEL LYMPH NODE BIOPSY;  Surgeon: Desiderio Schanz, MD;  Location: [facility];  Service: General;  Laterality: Left;   COLONOSCOPY WITH PROPOFOL  N/A 04/10/2015   Procedure: COLONOSCOPY WITH PROPOFOL ;  Surgeon: Rogelia Copping, MD;  Location: [facility];  Service: Endoscopy;  Laterality: N/A;   COLONOSCOPY WITH PROPOFOL  N/A 06/25/2021   Procedure: COLONOSCOPY WITH PROPOFOL ;  Surgeon: Copping Rogelia, MD;  Location: [facility];  Service: Endoscopy;  Laterality: N/A;  leave at 8 arrival   ESOPHAGOGASTRODUODENOSCOPY (EGD) WITH PROPOFOL  N/A 12/06/2021   Procedure: ESOPHAGOGASTRODUODENOSCOPY (EGD) WITH PROPOFOL ;  Surgeon: Copping Rogelia, MD;  Location: [facility];  Service: Endoscopy;  Laterality: N/A;   POLYPECTOMY  04/10/2015   Procedure: POLYPECTOMY;  Surgeon:  Rogelia Copping, MD;  Location: [facility];  Service: Endoscopy;;   POLYPECTOMY N/A 06/25/2021   Procedure: POLYPECTOMY;  Surgeon: Copping Rogelia, MD;  Location: [name];  Service: Endoscopy;  Laterality: N/A;   STATUS POST ENDOMETRIAL ABLATION     TONSILLECTOMY     TUBAL LIGATION  bilateral, sterile ablation   VARICOSE VEINS      SOCIAL HISTORY: Social History   Socioeconomic History   Marital status: Widowed    Spouse name: Not on file   Number of children: 3   Years of education: 12   Highest education level: Some college, no degree  Occupational History   Occupation: Clinical research associate    Comment: CNA  Tobacco Use   Smoking status: Every Day    Current packs/day: 0.50    Average packs/day: 0.5 packs/day for 37.1 years (18.5 ttl pk-yrs)    Types: Cigarettes    Start date: 02/01/1987    Passive exposure: Past   Smokeless tobacco: Never   Tobacco comments:    Currently smoking 10 cigarettes a day  Vaping Use   Vaping status: Never Used  Substance and Sexual Activity   Alcohol use: No   Drug use: No   Sexual activity: Not Currently    Birth control/protection: Abstinence  Other Topics Concern   Not on file  Social History Narrative   Lives alone - daughter stays with her most of the time   Social Drivers of Health   Financial Resource Strain: Low Risk  (10/20/2023)   Overall Financial Resource Strain (CARDIA)    Difficulty of Paying Living Expenses: Not hard at all  Food Insecurity: No Food Insecurity (10/20/2023)   Hunger Vital Sign    Worried About Running Out of Food in the Last Year: Never true    Ran Out of Food in the Last Year: Never true  Transportation Needs: No Transportation Needs (10/20/2023)   PRAPARE - Administrator, Civil Service (Medical): No    Lack of Transportation (Non-Medical): No  Physical Activity: Insufficiently Active (10/20/2023)   Exercise Vital Sign    Days of Exercise per Week: 3 days    Minutes  of Exercise per Session: 20 min  Stress: Stress Concern Present (10/20/2023)   [employer] of Occupational Health - Occupational Stress Questionnaire    Feeling of Stress : To some extent  Social Connections: Moderately Isolated (10/20/2023)   Social Connection and Isolation Panel    Frequency of Communication with Friends and Family: More than three times a week    Frequency of Social Gatherings with Friends and Family: Twice a week    Attends Religious Services: 1 to 4 times per year    Active Member of [employer] or Organizations: No    Attends Banker Meetings: Never    Marital Status: Widowed  Intimate Partner Violence: Not At Risk (03/12/2023)   Humiliation, Afraid, Rape, and Kick questionnaire    Fear of Current or Ex-Partner: No    Emotionally Abused: No    Physically Abused: No    Sexually Abused: No    FAMILY HISTORY: Family History  Problem Relation Age of Onset   Coronary artery disease Mother    Hypertension Mother    Rheum arthritis Mother    Heart attack Mother    Lung cancer Father    Emphysema Father    Gout Father    Diabetes Sister    Hypertension Daughter    Hypertension Daughter    Obesity Daughter    Obesity Daughter    Seizures Daughter    Breast cancer Neg Hx    Bladder Cancer Neg Hx    Kidney cancer Neg Hx     ALLERGIES:  is allergic to pravastatin sodium.  MEDICATIONS:  Current Outpatient Medications  Medication  Sig Dispense Refill   acetaminophen  (TYLENOL ) 500 MG tablet Take 1,000 mg by mouth every 6 (six) hours as needed for mild pain.     albuterol  (VENTOLIN  HFA) 108 (90 Base) MCG/ACT inhaler Inhale 1-2 puffs into the lungs every 6 (six) hours as needed for wheezing or shortness of breath. 18 g 11   ALPRAZolam  (XANAX ) 0.25 MG tablet TAKE 1 TABLET BY MOUTH TWICE  DAILY AS NEEDED FOR ANXIETY  (NEED AN APPOINTMENT WITH PCP  FOR ANY FURTHER REFILLS) 60 tablet 0   amLODipine  (NORVASC ) 10 MG tablet TAKE 1 TABLET BY MOUTH DAILY 100  tablet 1   aspirin EC 81 MG tablet Take 81 mg by mouth daily. Swallow whole.     Calcium  Carb-Cholecalciferol (CALCIUM  PLUS VITAMIN D3) 600-20 MG-MCG TABS Take 1 tablet by mouth every Monday, Wednesday, and Friday.     cetirizine (ZYRTEC) 10 MG tablet Take 1 tablet (10 mg total) by mouth daily. 30 tablet 11   famotidine  (PEPCID ) 20 MG tablet Take 20 mg by mouth as needed for heartburn.     Fluticasone -Umeclidin-Vilant (TRELEGY ELLIPTA ) 100-62.5-25 MCG/ACT AEPB USE 1 INHALATION BY MOUTH DAILY 180 each 0   hydrochlorothiazide  (HYDRODIURIL ) 25 MG tablet TAKE 1 TABLET BY MOUTH DAILY 100 tablet 2   lisinopril  (ZESTRIL ) 40 MG tablet Take 1 tablet (40 mg total) by mouth daily. 100 tablet 1   metFORMIN  (GLUCOPHAGE -XR) 750 MG 24 hr tablet Take 1 tablet (750 mg total) by mouth daily with breakfast. 100 tablet 1   Multiple Vitamin (MULTIVITAMIN ADULT PO) Take 1 tablet by mouth daily.     PARoxetine  (PAXIL ) 10 MG tablet Take 1 tablet (10 mg total) by mouth daily. 90 tablet 3   rosuvastatin  (CRESTOR ) 40 MG tablet TAKE 1 TABLET BY MOUTH DAILY 90 tablet 3   Spacer/Aero-Holding Chambers (AEROCHAMBER MV) inhaler Use as instructed 1 each 1   letrozole  (FEMARA ) 2.5 MG tablet Take 1 tablet (2.5 mg total) by mouth daily. 100 tablet 2   No current facility-administered medications for this visit.    Review of Systems  Constitutional:  Negative for appetite change, chills, fatigue and fever.  HENT:   Negative for hearing loss and voice change.   Eyes:  Negative for eye problems.  Respiratory:  Negative for chest tightness and cough.   Cardiovascular:  Negative for chest pain.  Gastrointestinal:  Negative for abdominal distention, abdominal pain and blood in stool.  Endocrine: Positive for hot flashes.  Genitourinary:  Negative for difficulty urinating and frequency.   Musculoskeletal:  Negative for arthralgias.  Skin:  Negative for itching and rash.  Neurological:  Negative for extremity weakness.   Hematological:  Negative for adenopathy.  Psychiatric/Behavioral:  Negative for confusion.      PHYSICAL EXAMINATION: ECOG PERFORMANCE STATUS: 1 - Symptomatic but completely ambulatory  Vitals:   03/01/24 1017  BP: (!) 140/67  Pulse: 75  Resp: 18  Temp: (!) 96.7 F (35.9 C)  SpO2: 96%   There were no vitals filed for this visit.  Physical Exam Constitutional:      General: She is not in acute distress.    Appearance: She is not diaphoretic.  HENT:     Head: Normocephalic and atraumatic.   Eyes:     General: No scleral icterus.   Cardiovascular:     Rate and Rhythm: Normal rate and regular rhythm.     Heart sounds: No murmur heard. Pulmonary:     Effort: Pulmonary effort is normal. No respiratory distress.  Breath sounds: No wheezing.     Comments: Decreased breath sound bilaterally Abdominal:     General: There is no distension.     Palpations: Abdomen is soft.     Tenderness: There is no abdominal tenderness.   Musculoskeletal:        General: Normal range of motion.     Cervical back: Normal range of motion and neck supple.   Skin:    General: Skin is warm and dry.     Findings: No erythema.   Neurological:     Mental Status: She is alert and oriented to person, place, and time.     Cranial Nerves: No cranial nerve deficit.     Motor: No abnormal muscle tone.     Coordination: Coordination normal.   Psychiatric:        Mood and Affect: Affect normal.     LABORATORY DATA:  I have reviewed the data as listed Lab Results  Component Value Date   WBC 7.9 03/01/2024   HGB 14.8 03/01/2024   HCT 44.6 03/01/2024   MCV 86.4 03/01/2024   PLT 313 03/01/2024   Recent Labs    09/01/23 0949 10/24/23 0943 01/15/24 1443 03/01/24 0957  NA 134* 136 138 130*  K 3.3* 3.7 3.9 4.1  CL 95* 93* 97 94*  CO2 29 24 23 27   GLUCOSE 97 108* 119* 103*  BUN 10 7* 10 10  CREATININE 0.69 0.74 0.78 0.66  CALCIUM  10.0 10.8* 10.1 10.0  GFRNONAA >60  --   --  >60   PROT 7.0 6.8 6.1 7.1  ALBUMIN 4.4 4.6 4.1 4.2  AST 18 14 13 16   ALT 15 11 10 13   ALKPHOS 75 123* 105 95  BILITOT 0.6 0.5 0.2 0.6    RADIOGRAPHIC STUDIES: I have personally reviewed the radiological images as listed and agreed with the findings in the report. No results found.

## 2024-03-01 NOTE — Assessment & Plan Note (Signed)
 Recommend calcium  and vitamin D supplementation. 09/16/2022 DEXA showed normal bone density.  Plan to repeat in 2 years.

## 2024-03-01 NOTE — Assessment & Plan Note (Signed)
 Recommend smoke cessation. Obtain CT chest without contrast lung cancer screening.

## 2024-03-01 NOTE — Addendum Note (Signed)
 Addended by: BABARA CALL on: 03/01/2024 04:32 PM   Modules accepted: Level of Service

## 2024-03-01 NOTE — Assessment & Plan Note (Addendum)
 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  Stage Ia 07/17/2022 status post lumpectomy with sentinel lymph node biopsy.  Oncotype DX showed recurrence score of 18.  No adjuvant chemotherapy. 09/26/2022 status post adjuvant radiation to left breast. 10/03/2022 endocrine therapy with letrozole  2.5 mg daily.  Plan for 5 years. Overall she tolerates well.  Manageable hot flashes.  Recommend annual mammogram-October 2025 Patient declined genetic testing.

## 2024-03-08 ENCOUNTER — Ambulatory Visit: Admission: RE | Admit: 2024-03-08 | Source: Ambulatory Visit

## 2024-03-15 ENCOUNTER — Ambulatory Visit
Admission: RE | Admit: 2024-03-15 | Discharge: 2024-03-15 | Disposition: A | Discharge status: Home / Self Care | Source: Ambulatory Visit | Attending: Oncology | Admitting: Oncology

## 2024-03-15 DIAGNOSIS — Z853 Personal history of malignant neoplasm of breast: Secondary | ICD-10-CM | POA: Insufficient documentation

## 2024-03-15 DIAGNOSIS — I2510 Atherosclerotic heart disease of native coronary artery without angina pectoris: Secondary | ICD-10-CM | POA: Insufficient documentation

## 2024-03-15 DIAGNOSIS — R918 Other nonspecific abnormal finding of lung field: Secondary | ICD-10-CM | POA: Diagnosis not present

## 2024-03-15 DIAGNOSIS — I700 Atherosclerosis of aorta: Secondary | ICD-10-CM | POA: Insufficient documentation

## 2024-03-15 DIAGNOSIS — Z872 Personal history of diseases of the skin and subcutaneous tissue: Secondary | ICD-10-CM | POA: Diagnosis not present

## 2024-03-15 DIAGNOSIS — Z86018 Personal history of other benign neoplasm: Secondary | ICD-10-CM | POA: Diagnosis not present

## 2024-03-15 DIAGNOSIS — J439 Emphysema, unspecified: Secondary | ICD-10-CM | POA: Diagnosis not present

## 2024-03-15 DIAGNOSIS — L570 Actinic keratosis: Secondary | ICD-10-CM | POA: Diagnosis not present

## 2024-03-15 DIAGNOSIS — L578 Other skin changes due to chronic exposure to nonionizing radiation: Secondary | ICD-10-CM | POA: Diagnosis not present

## 2024-03-15 DIAGNOSIS — Z122 Encounter for screening for malignant neoplasm of respiratory organs: Secondary | ICD-10-CM | POA: Diagnosis not present

## 2024-03-15 DIAGNOSIS — L821 Other seborrheic keratosis: Secondary | ICD-10-CM | POA: Diagnosis not present

## 2024-03-15 DIAGNOSIS — J432 Centrilobular emphysema: Secondary | ICD-10-CM | POA: Diagnosis not present

## 2024-03-16 ENCOUNTER — Other Ambulatory Visit: Payer: Self-pay | Admitting: Physician Assistant

## 2024-03-16 DIAGNOSIS — J449 Chronic obstructive pulmonary disease, unspecified: Secondary | ICD-10-CM

## 2024-03-17 ENCOUNTER — Ambulatory Visit: Payer: 59

## 2024-03-17 VITALS — Ht 67.0 in | Wt 164.0 lb

## 2024-03-17 DIAGNOSIS — Z0000 Encounter for general adult medical examination without abnormal findings: Secondary | ICD-10-CM

## 2024-03-17 NOTE — Patient Instructions (Signed)
 Ms. Poon ,  Thank you for taking time out of your busy schedule to complete your Annual Wellness Visit with me. I enjoyed our conversation and look forward to speaking with you again next year. I, as well as your care team,  appreciate your ongoing commitment to your health goals. Please review the following plan we discussed and let me know if I can assist you in the future.  I enjoyed our conversation and look forward to it again next year. Blessing for the upcoming year!!  -Claude Waldman  Your Game plan/ To Do List   Referrals Placed:  We will mail you an Advanced Directives packet as we discussed during your visit today. You do not have to have an attorney to complete this paperwork. Read over the packet, discuss it with family, and complete it. Choose who will be making decisions for you in the event you can no longer make them for yourself. Once completed have them notarized, and bring the packet back to the office. We will scan it and make sure it gets into your chart.   If you choose to have a DNR (Do Not Resuscitate Order) you must get this from your primary care doctor because they have to sign it. You can get this in the office during an appointment.   THIS ORDER MUST BE VISIBLE AT ALL TIMES WITHIN YOUR HOME SUCH AS ON A REFRIGERATOR.    Follow up Visits:  Next Office Visit with your Primary Care Provider: April 19, 2024 at 10:20 in office with [facility] with Health Advisor (1 year): March 22, 2025 at 12:30 pm video visit    Clinician Recommendations:  Aim for 30 minutes of exercise or brisk walking, 6-8 glasses of water , and 5 servings of fruits and vegetables each day.       This is a list of the screening recommended for you and due dates:  Health Maintenance  Topic Date Due   COVID-19 Vaccine (1) Never done   Zoster (Shingles) Vaccine (2 of 2) 08/07/2022   Flu Shot  04/02/2024   Hemoglobin A1C  04/22/2024   Eye exam for diabetics  05/05/2024   Mammogram   06/08/2024   DEXA scan (bone density measurement)  09/16/2024   Yearly kidney health urinalysis for diabetes  10/23/2024   Complete foot exam   10/23/2024   Yearly kidney function blood test for diabetes  03/01/2025   Medicare Annual Wellness Visit  03/17/2025   Colon Cancer Screening  06/25/2026   DTaP/Tdap/Td vaccine (2 - Td or Tdap) 03/18/2027   Pneumococcal Vaccine for age over 53  Completed   Hepatitis C Screening  Completed   Hepatitis B Vaccine  Aged Out   HPV Vaccine  Aged Out   Meningitis B Vaccine  Aged Out    Advanced directives: (Provided) Advance directive discussed with you today. I have provided a copy for you to complete at home and have notarized. Once this is complete, please bring a copy in to our office so we can scan it into your chart.  Advance Care Planning is important because it:  [x]  Makes sure you receive the medical care that is consistent with your values, goals, and preferences  [x]  It provides guidance to your family and loved ones and reduces their decisional burden about whether or not they are making the right decisions based on your wishes.  Follow the link provided in your after visit summary or read over the paperwork we have mailed to  you to help you started getting your Advance Directives in place. If you need assistance in completing these, please reach out to us  so that we can help you!  If you choose to send your directives in yourself, the information to do so is below:  Please email a copy of your Advanced Healthcare Directives,such as your Healthcare Power of Attorney, Living Will or DNR status to the following secure email: [email or web address]

## 2024-03-17 NOTE — Progress Notes (Signed)
 Subjective:   Alexandria [name] is a 68 y.o. who presents for a Medicare Wellness preventive visit.  As a reminder, Annual Wellness Visits don't include a physical exam, and some assessments may be limited, especially if this visit is performed virtually. We may recommend an in-person follow-up visit with your provider if needed.  Visit Complete: Virtual I connected with  Alexandria [name] on 03/17/24 by a audio enabled telemedicine application and verified that I am speaking with the correct person using two identifiers.  Patient Location: Home  Provider Location: Home Office  I discussed the limitations of evaluation and management by telemedicine. The patient expressed understanding and agreed to proceed.  Vital Signs: Because this visit was a virtual/telehealth visit, some criteria may be missing or patient reported. Any vitals not documented were not able to be obtained and vitals that have been documented are patient reported.  VideoDeclined- This patient declined Librarian, academic. Therefore the visit was completed with audio only.  Persons Participating in Visit: Patient.  AWV Questionnaire: Yes: Patient Medicare AWV questionnaire was completed by the patient on 03/14/2024; I have confirmed that all information answered by patient is correct and no changes since this date.  Cardiac Risk Factors include: advanced age (>64men, >42 women);diabetes mellitus;dyslipidemia;hypertension;Other (see comment), Risk factor comments: COPD     Objective:    Today's Vitals   03/14/24 2159 03/17/24 0811  Weight:  164 lb (74.4 kg)  Height:  5' 7 (1.702 m)  PainSc: 0-No pain    Body mass index is 25.69 kg/m.     03/17/2024    7:53 AM 11/06/2023    1:26 PM 03/12/2023    9:37 AM 02/27/2023   10:21 AM 10/29/2022   10:07 AM 10/24/2022   10:08 AM 08/07/2022   10:30 AM  Advanced Directives  Does Patient Have a Medical Advance Directive? No No No No No No No  Would patient  like information on creating a medical advance directive? Yes (MAU/Ambulatory/Procedural Areas - Information given) No - Patient declined    No - Patient declined No - Patient declined    Current Medications (verified) Outpatient Encounter Medications as of 03/17/2024  Medication Sig   acetaminophen  (TYLENOL ) 500 MG tablet Take 1,000 mg by mouth every 6 (six) hours as needed for mild pain.   albuterol  (VENTOLIN  HFA) 108 (90 Base) MCG/ACT inhaler Inhale 1-2 puffs into the lungs every 6 (six) hours as needed for wheezing or shortness of breath.   ALPRAZolam  (XANAX ) 0.25 MG tablet TAKE 1 TABLET BY MOUTH TWICE  DAILY AS NEEDED FOR ANXIETY  (NEED AN APPOINTMENT WITH PCP  FOR ANY FURTHER REFILLS)   amLODipine  (NORVASC ) 10 MG tablet TAKE 1 TABLET BY MOUTH DAILY   aspirin EC 81 MG tablet Take 81 mg by mouth daily. Swallow whole.   Calcium  Carb-Cholecalciferol (CALCIUM  PLUS VITAMIN D3) 600-20 MG-MCG TABS Take 1 tablet by mouth every Monday, Wednesday, and Friday.   cetirizine (ZYRTEC) 10 MG tablet Take 1 tablet (10 mg total) by mouth daily.   famotidine  (PEPCID ) 20 MG tablet Take 20 mg by mouth as needed for heartburn.   Fluticasone -Umeclidin-Vilant (TRELEGY ELLIPTA ) 100-62.5-25 MCG/ACT AEPB USE 1 INHALATION BY MOUTH DAILY   hydrochlorothiazide  (HYDRODIURIL ) 25 MG tablet TAKE 1 TABLET BY MOUTH DAILY   letrozole  (FEMARA ) 2.5 MG tablet Take 1 tablet (2.5 mg total) by mouth daily.   lisinopril  (ZESTRIL ) 40 MG tablet Take 1 tablet (40 mg total) by mouth daily.   metFORMIN  (GLUCOPHAGE -XR)  750 MG 24 hr tablet Take 1 tablet (750 mg total) by mouth daily with breakfast.   Multiple Vitamin (MULTIVITAMIN ADULT PO) Take 1 tablet by mouth daily.   PARoxetine  (PAXIL ) 10 MG tablet Take 1 tablet (10 mg total) by mouth daily.   rosuvastatin  (CRESTOR ) 40 MG tablet TAKE 1 TABLET BY MOUTH DAILY   Spacer/Aero-Holding Chambers (AEROCHAMBER MV) inhaler Use as instructed   [DISCONTINUED] fluticasone  (FLONASE ) 50 MCG/ACT nasal  spray Place 2 sprays into both nostrils daily.   No facility-administered encounter medications on file as of 03/17/2024.    Allergies (verified) Pravastatin sodium   History: Past Medical History:  Diagnosis Date   Anxiety    Breast cancer (HCC)    Cancer (HCC)    Carbuncle and furuncle of trunk    COPD (chronic obstructive pulmonary disease) (HCC)    Depression    Diabetes mellitus without complication (HCC)    Dyspnea    GERD (gastroesophageal reflux disease)    HTN (hypertension)    Hyperlipidemia    Intrinsic asthma, unspecified    Methicillin resistant Staphylococcus aureus in conditions classified elsewhere and of unspecified site    Other psychological or physical stress, not elsewhere classified(V62.89)    Personal history of radiation therapy    Pneumonia    Pre-diabetes    Tobacco abuse    Past Surgical History:  Procedure Laterality Date   BREAST BIOPSY Left 06/26/2022   stereo biopsy/ coil clip/ positive   BREAST LUMPECTOMY Left 07/17/2022   BREAST LUMPECTOMY,RADIO FREQ LOCALIZER,AXILLARY SENTINEL LYMPH NODE BIOPSY Left 07/17/2022   Procedure: BREAST LUMPECTOMY,RADIO FREQ LOCALIZER,AXILLARY SENTINEL LYMPH NODE BIOPSY;  Surgeon: Desiderio Schanz, MD;  Location: [facility];  Service: General;  Laterality: Left;   COLONOSCOPY WITH PROPOFOL  N/A 04/10/2015   Procedure: COLONOSCOPY WITH PROPOFOL ;  Surgeon: Rogelia Copping, MD;  Location: [facility];  Service: Endoscopy;  Laterality: N/A;   COLONOSCOPY WITH PROPOFOL  N/A 06/25/2021   Procedure: COLONOSCOPY WITH PROPOFOL ;  Surgeon: Copping Rogelia, MD;  Location: [facility];  Service: Endoscopy;  Laterality: N/A;  leave at 8 arrival   ESOPHAGOGASTRODUODENOSCOPY (EGD) WITH PROPOFOL  N/A 12/06/2021   Procedure: ESOPHAGOGASTRODUODENOSCOPY (EGD) WITH PROPOFOL ;  Surgeon: Copping Rogelia, MD;  Location: [facility];  Service: Endoscopy;  Laterality: N/A;   POLYPECTOMY  04/10/2015   Procedure: POLYPECTOMY;  Surgeon:  Rogelia Copping, MD;  Location: [facility];  Service: Endoscopy;;   POLYPECTOMY N/A 06/25/2021   Procedure: POLYPECTOMY;  Surgeon: Copping Rogelia, MD;  Location: [facility];  Service: Endoscopy;  Laterality: N/A;   STATUS POST ENDOMETRIAL ABLATION     TONSILLECTOMY     TUBAL LIGATION     bilateral, sterile ablation   VARICOSE VEINS     Family History  Problem Relation Age of Onset   Coronary artery disease Mother    Hypertension Mother    Rheum arthritis Mother    Heart attack Mother    Lung cancer Father    Emphysema Father    Gout Father    Diabetes Sister    Hypertension Daughter    Obesity Daughter    Hypertension Daughter    Obesity Daughter    Obesity Daughter    Seizures Daughter    Breast cancer Neg Hx    Bladder Cancer Neg Hx    Kidney cancer Neg Hx    Social History   Socioeconomic History   Marital status: Widowed    Spouse name: Not on file   Number of children: 3  Years of education: 73   Highest education level: Some college, no degree  Occupational History   Occupation: Clinical research associate    Comment: CNA  Tobacco Use   Smoking status: Every Day    Current packs/day: 0.50    Average packs/day: 0.5 packs/day for 37.1 years (18.6 ttl pk-yrs)    Types: Cigarettes    Start date: 02/01/1987    Passive exposure: Past   Smokeless tobacco: Never   Tobacco comments:    Currently smoking 10 cigarettes a day  Vaping Use   Vaping status: Never Used  Substance and Sexual Activity   Alcohol use: No   Drug use: No   Sexual activity: Not Currently    Birth control/protection: Abstinence  Other Topics Concern   Not on file  Social History Narrative   Lives alone - daughter stays with her most of the time   Social Drivers of Health   Financial Resource Strain: Low Risk  (03/14/2024)   Overall Financial Resource Strain (CARDIA)    Difficulty of Paying Living Expenses: Not hard at all  Food Insecurity: No Food Insecurity (03/14/2024)    Hunger Vital Sign    Worried About Running Out of Food in the Last Year: Never true    Ran Out of Food in the Last Year: Never true  Transportation Needs: No Transportation Needs (03/14/2024)   PRAPARE - Administrator, Civil Service (Medical): No    Lack of Transportation (Non-Medical): No  Physical Activity: Insufficiently Active (03/14/2024)   Exercise Vital Sign    Days of Exercise per Week: 3 days    Minutes of Exercise per Session: 20 min  Stress: No Stress Concern Present (03/14/2024)   [employer] of Occupational Health - Occupational Stress Questionnaire    Feeling of Stress: Only a little  Social Connections: Moderately Isolated (03/14/2024)   Social Connection and Isolation Panel    Frequency of Communication with Friends and Family: More than three times a week    Frequency of Social Gatherings with Friends and Family: More than three times a week    Attends Religious Services: More than 4 times per year    Active Member of [employer] or Organizations: No    Attends Banker Meetings: Never    Marital Status: Widowed    Tobacco Counseling Ready to quit: No Counseling given: Yes Tobacco comments: Currently smoking 10 cigarettes a day    Clinical Intake:  Pre-visit preparation completed: Yes  Pain : No/denies pain Pain Score: 0-No pain     BMI - recorded: 25.69 Nutritional Status: BMI 25 -29 Overweight Nutritional Risks: None Diabetes: Yes CBG done?: No (telehealth visit. unable to obtain cbg) Did pt. bring in CBG monitor from home?: No  Lab Results  Component Value Date   HGBA1C 6.1 (H) 10/24/2023   HGBA1C 6.3 (H) 04/16/2023   HGBA1C 6.1 (H) 04/11/2022     How often do you need to have someone help you when you read instructions, pamphlets, or other written materials from your doctor or pharmacy?: 1 - Never  Interpreter Needed?: No  Information entered by :: [facility]   Activities of Daily Living     03/14/2024     9:59 PM  In your present state of health, do you have any difficulty performing the following activities:  Hearing? 0  Vision? 0  Difficulty concentrating or making decisions? 0  Walking or climbing stairs? 0  Dressing or bathing? 0  Doing  errands, shopping? 0  Preparing Food and eating ? N  Using the Toilet? N  In the past six months, have you accidently leaked urine? Y  Do you have problems with loss of bowel control? N  Managing your Medications? N  Managing your Finances? N  Housekeeping or managing your Housekeeping? N    Patient Care Team: Wellington Curtis LABOR, FNP as PCP - General (Family Medicine) Georgina Shasta POUR, RN as Oncology Nurse Navigator Lenn Aran, MD as Consulting Physician (Radiation Oncology) Desiderio Schanz, MD as Consulting Physician (General Surgery) Babara Call, MD as Consulting Physician (Oncology) Myrna Adine Anes, MD as Consulting Physician (Ophthalmology) Signa Aloysius HERO, [facility] (Pharmacist) Agrawal, Kavita, MD as Consulting Physician (Oncology)  I have updated your Care Teams any recent Medical Services you may have received from other providers in the past year.     Assessment:   This is a routine wellness examination for [facility]  Hearing/Vision screen Hearing Screening - Comments:: Patient denies any hearing difficulties.   Vision Screening - Comments:: Wears rx glasses - up to date with routine eye exams with  Dr. Myrna with [facility] in [place]   Goals Addressed             This Visit's Progress    DIET - EAT MORE FRUITS AND VEGETABLES   On track    Patient Stated       I want to start eating healthier.        Depression Screen     10/24/2023    9:08 AM 07/15/2023   10:42 AM 04/16/2023    9:48 AM 03/12/2023    9:33 AM 08/29/2022    9:30 AM 08/12/2022   10:51 AM 04/08/2022    1:42 PM  PHQ 2/9 Scores  PHQ - 2 Score 0 0 1 0 1 2 0  PHQ- 9 Score 2 3    7  0    Fall Risk     03/14/2024    9:59 PM 04/16/2023    9:48 AM  03/12/2023    9:28 AM 09/27/2022    9:18 AM 08/29/2022   11:38 PM  Fall Risk   Falls in the past year? 0 0 0 0 0  Number falls in past yr: 0  0 0 0  Injury with Fall? 0 0 0 0 0  Risk for fall due to : No Fall Risks No Fall Risks No Fall Risks Medication side effect Medication side effect  Follow up Falls evaluation completed;Education provided;Falls prevention discussed Falls evaluation completed Falls prevention discussed;Education provided Falls prevention discussed Falls prevention discussed      Data saved with a previous flowsheet row definition    MEDICARE RISK AT HOME:  Medicare Risk at Home Any stairs in or around the home?: (Patient-Rptd) Yes If so, are there any without handrails?: (Patient-Rptd) No Home free of loose throw rugs in walkways, pet beds, electrical cords, etc?: (Patient-Rptd) Yes Adequate lighting in your home to reduce risk of falls?: (Patient-Rptd) Yes Life alert?: (Patient-Rptd) No Use of a cane, walker or w/c?: (Patient-Rptd) No Grab bars in the bathroom?: (Patient-Rptd) No Shower chair or bench in shower?: (Patient-Rptd) No Elevated toilet seat or a handicapped toilet?: (Patient-Rptd) No  TIMED UP AND GO:  Was the test performed?  No  Cognitive Function: 6CIT completed        03/17/2024    8:14 AM 03/12/2023    9:38 AM 04/08/2022    1:46 PM 04/05/2021  9:08 AM 04/03/2020   10:17 AM  6CIT Screen  What Year? 0 points 0 points 0 points 0 points 0 points  What month? 0 points 0 points 0 points 0 points 0 points  What time? 0 points 0 points 0 points 0 points 0 points  Count back from 20 0 points 0 points 0 points 0 points 0 points  Months in reverse 0 points 0 points 0 points 0 points 0 points  Repeat phrase 0 points 0 points 0 points 0 points 0 points  Total Score 0 points 0 points 0 points 0 points 0 points    Immunizations Immunization History  Administered Date(s) Administered   Fluad Quad(high Dose 65+) 07/17/2021   Influenza Inj Mdck Quad  Pf 06/02/2022   Influenza,inj,Quad PF,6+ Mos 05/19/2018   Influenza-Unspecified 06/14/2019   PNEUMOCOCCAL CONJUGATE-20 04/11/2022   Pneumococcal Polysaccharide-23 03/10/2015, 04/03/2020   Respiratory Syncytial Virus Vaccine,Recomb Aduvanted(Arexvy) 06/02/2022   Tdap 03/17/2017   Zoster Recombinant(Shingrix) 06/12/2022   Zoster, Live 03/10/2015    Screening Tests Health Maintenance  Topic Date Due   COVID-19 Vaccine (1) Never done   Zoster Vaccines- Shingrix (2 of 2) 08/07/2022   INFLUENZA VACCINE  04/02/2024   HEMOGLOBIN A1C  04/22/2024   OPHTHALMOLOGY EXAM  05/05/2024   MAMMOGRAM  06/08/2024   DEXA SCAN  09/16/2024   Diabetic kidney evaluation - Urine ACR  10/23/2024   FOOT EXAM  10/23/2024   Diabetic kidney evaluation - eGFR measurement  03/01/2025   Medicare Annual Wellness (AWV)  03/17/2025   Colonoscopy  06/25/2026   DTaP/Tdap/Td (2 - Td or Tdap) 03/18/2027   Pneumococcal Vaccine: 50+ Years  Completed   Hepatitis C Screening  Completed   Hepatitis B Vaccines  Aged Out   HPV VACCINES  Aged Out   Meningococcal B Vaccine  Aged Out    Health Maintenance  Health Maintenance Due  Topic Date Due   COVID-19 Vaccine (1) Never done   Zoster Vaccines- Shingrix (2 of 2) 08/07/2022   Health Maintenance Items Addressed: Patient advised of recommended vaccines and where to obtain those vaccines with verbal understanding  Additional Screening:  Vision Screening: Recommended annual ophthalmology exams for early detection of glaucoma and other disorders of the eye. Would you like a referral to an eye doctor? No    Dental Screening: Recommended annual dental exams for proper oral hygiene  Community Resource Referral / Chronic Care Management: CRR required this visit?  No   CCM required this visit?  No   Plan:    I have personally reviewed and noted the following in the patient's chart:   Medical and social history Use of alcohol, tobacco or illicit drugs  Current  medications and supplements including opioid prescriptions. Patient is not currently taking opioid prescriptions. Functional ability and status Nutritional status Physical activity Advanced directives List of other physicians Hospitalizations, surgeries, and ER visits in previous 12 months Vitals Screenings to include cognitive, depression, and falls Referrals and appointments  In addition, I have reviewed and discussed with patient certain preventive protocols, quality metrics, and best practice recommendations. A written personalized care plan for preventive services as well as general preventive health recommendations were provided to patient.   Amram Maya, CMA   03/17/2024   After Visit Summary: (MyChart) Due to this being a telephonic visit, the after visit summary with patients personalized plan was offered to patient via MyChart   Notes: Nothing significant to report at this time.

## 2024-03-18 ENCOUNTER — Telehealth: Payer: Self-pay | Admitting: Oncology

## 2024-03-18 ENCOUNTER — Other Ambulatory Visit: Payer: Self-pay | Admitting: Oncology

## 2024-03-18 ENCOUNTER — Other Ambulatory Visit: Payer: Self-pay

## 2024-03-18 ENCOUNTER — Telehealth: Payer: Self-pay

## 2024-03-18 DIAGNOSIS — Z122 Encounter for screening for malignant neoplasm of respiratory organs: Secondary | ICD-10-CM

## 2024-03-18 MED ORDER — AZITHROMYCIN 250 MG PO TABS: ORAL_TABLET | ORAL | 0 refills | Status: DC

## 2024-03-18 NOTE — Telephone Encounter (Signed)
 Copied from CRM [id number]. Topic: Clinical - Lab/Test Results >> Mar 18, 2024  1:06 PM Avram MATSU wrote: Reason for CRM: patient have questions about her test results for xray [phone]

## 2024-03-18 NOTE — Telephone Encounter (Signed)
 PT will need CT chest in 3 months. order has been placed. Please schedule and notify pt of appts.

## 2024-03-18 NOTE — Telephone Encounter (Signed)
 Patient called and would like someone to call her about her CT results.  Number listed is correct

## 2024-03-22 NOTE — Telephone Encounter (Signed)
 LVM requesting patient to call back- need to know what questions she has.

## 2024-03-24 NOTE — Telephone Encounter (Signed)
 2nd attempt to reach pt, sending mychart message as well to inquire on what questions she has.

## 2024-03-29 ENCOUNTER — Other Ambulatory Visit: Payer: Self-pay

## 2024-03-29 ENCOUNTER — Telehealth: Payer: Self-pay | Admitting: Family Medicine

## 2024-03-29 MED ORDER — ALBUTEROL SULFATE HFA 108 (90 BASE) MCG/ACT IN AERS: 1.0000 | INHALATION_SPRAY | Freq: Four times a day (QID) | RESPIRATORY_TRACT | 11 refills | Status: AC | PRN

## 2024-03-29 MED ORDER — ROSUVASTATIN CALCIUM 40 MG PO TABS: 40.0000 mg | ORAL_TABLET | Freq: Every day | ORAL | 0 refills | Status: DC

## 2024-03-29 NOTE — Telephone Encounter (Signed)
 Converted into refill request and sent in to [facility]

## 2024-03-29 NOTE — Telephone Encounter (Signed)
 Converted into a refill request

## 2024-03-29 NOTE — Telephone Encounter (Signed)
 [facility] is requesting refills on Ventolin  HFA Aer 90mcg  with refills

## 2024-03-29 NOTE — Telephone Encounter (Signed)
 [facility] faxed refill request for the following medications:   rosuvastatin  (CRESTOR ) 40 MG tablet     Please advise

## 2024-04-08 ENCOUNTER — Other Ambulatory Visit: Payer: Self-pay | Admitting: Family Medicine

## 2024-04-08 DIAGNOSIS — I10 Essential (primary) hypertension: Secondary | ICD-10-CM

## 2024-04-16 ENCOUNTER — Encounter: Payer: 59 | Admitting: Family Medicine

## 2024-04-19 ENCOUNTER — Encounter: Payer: Self-pay | Admitting: Family Medicine

## 2024-04-19 ENCOUNTER — Ambulatory Visit: Payer: 59 | Admitting: Family Medicine

## 2024-04-19 VITALS — BP 117/59 | HR 69 | Temp 97.7°F | Ht 67.0 in | Wt 163.5 lb

## 2024-04-19 DIAGNOSIS — E1159 Type 2 diabetes mellitus with other circulatory complications: Secondary | ICD-10-CM | POA: Diagnosis not present

## 2024-04-19 DIAGNOSIS — E1169 Type 2 diabetes mellitus with other specified complication: Secondary | ICD-10-CM | POA: Diagnosis not present

## 2024-04-19 DIAGNOSIS — Z0000 Encounter for general adult medical examination without abnormal findings: Secondary | ICD-10-CM | POA: Diagnosis not present

## 2024-04-19 DIAGNOSIS — Z853 Personal history of malignant neoplasm of breast: Secondary | ICD-10-CM | POA: Diagnosis not present

## 2024-04-19 DIAGNOSIS — J302 Other seasonal allergic rhinitis: Secondary | ICD-10-CM | POA: Diagnosis not present

## 2024-04-19 DIAGNOSIS — I152 Hypertension secondary to endocrine disorders: Secondary | ICD-10-CM

## 2024-04-19 DIAGNOSIS — Z7984 Long term (current) use of oral hypoglycemic drugs: Secondary | ICD-10-CM | POA: Diagnosis not present

## 2024-04-19 DIAGNOSIS — E119 Type 2 diabetes mellitus without complications: Secondary | ICD-10-CM

## 2024-04-19 DIAGNOSIS — F17200 Nicotine dependence, unspecified, uncomplicated: Secondary | ICD-10-CM

## 2024-04-19 DIAGNOSIS — E785 Hyperlipidemia, unspecified: Secondary | ICD-10-CM | POA: Diagnosis not present

## 2024-04-19 DIAGNOSIS — F39 Unspecified mood [affective] disorder: Secondary | ICD-10-CM

## 2024-04-19 MED ORDER — AZELASTINE HCL 0.1 % NA SOLN: 1.0000 | Freq: Every day | NASAL | 12 refills | Status: AC

## 2024-04-19 NOTE — Progress Notes (Signed)
 Complete physical exam  Patient: Alexandria [name]   DOB: October 12, 1955   68 y.o. Female  MRN: [id number]  Introduced to nurse practitioner role and practice setting.  All questions answered.  Discussed provider/patient relationship and expectations.   Subjective:    Chief Complaint  Patient presents with   Annual Exam    Diet- General Exercise- Little bit, walking to the mailbox and around the yard.  Leg exercises in the bed. Feeling- Pretty good Sleep- Good Concerns- None  Shingles- Vaccine reports she is complete with these    [name] is a 68 y.o. female who presents today for a complete physical exam. She reports consuming a general diet. The patient does not participate in regular exercise at present. She generally feels fairly well. She reports sleeping well. She does not have additional problems to discuss today.   Discussed the use of AI scribe software for clinical note transcription with the patient, who gave verbal consent to proceed.  History of Present Illness Alexandria [name] is a 68 year old female who presents for annual physical.  She has a history of breast cancer, detected early through regular mammograms. On Letrozole  for five years intil 2029 She has regular mammograms scheduled, with the next one in October.  She has a history of smoking and currently smokes about half a pack a day. She experiences shortness of breath with heavy activities She has been screened for lung cancer due to her smoking history, with a CT scan showing inflammation. She was treated with antibiotics and is scheduled for a follow-up CT scan in October - ordered by oncologist.  She has a history of dental issues and is apprehensive about visiting the dentist due to past negative experiences. She has been without teeth for a long time.   She experiences some vision changes, with a cataract noted last year. She uses prescription reading glasses and has an eye exam scheduled for September.   Most recent  fall risk assessment:    04/19/2024   10:22 AM  Fall Risk   Falls in the past year? 0  Number falls in past yr: 0  Injury with Fall? 0  Risk for fall due to : No Fall Risks     Most recent depression screenings:    04/19/2024   10:22 AM 10/24/2023    9:08 AM  PHQ 2/9 Scores  PHQ - 2 Score 0 0  PHQ- 9 Score 0 2      04/19/2024   10:23 AM 10/24/2023    9:09 AM 07/15/2023   10:44 AM 01/23/2023    9:38 AM  GAD 7 : Generalized Anxiety Score  Nervous, Anxious, on Edge 0 1 0 1  Control/stop worrying 0 0 1 0  Worry too much - different things 0 1 1 0  Trouble relaxing 0 1 0 1  Restless 0 0 1 1  Easily annoyed or irritable 0 1 1 0  Afraid - awful might happen 0 0 0 0  Total GAD 7 Score 0 4 4 3   Anxiety Difficulty Not difficult at all Not difficult at all Not difficult at all Not difficult at all      Vision:Within last year and Dental: No current dental problems and No regular dental care   Patient Active Problem List   Diagnosis Date Noted   Psychophysiological insomnia 07/15/2023   Mood disorder (HCC) 06/17/2023   Tobacco dependence 04/16/2023   DM type 2, goal HbA1c < 7% (HCC)  04/16/2023   Long term current use of aromatase inhibitor 08/08/2022   Diabetes mellitus (HCC) 07/28/2022   Breast cancer, left (HCC) 07/01/2022   Annual physical exam 04/11/2022   Hypertension associated with diabetes (HCC) 04/11/2022   Hyperlipidemia associated with type 2 diabetes mellitus (HCC) 04/11/2022   History of colonic polyps    Tobacco use    Past Medical History:  Diagnosis Date   Anxiety    Breast cancer (HCC)    Cancer (HCC)    Carbuncle and furuncle of trunk    COPD (chronic obstructive pulmonary disease) (HCC)    Depression    Diabetes mellitus without complication (HCC)    Dyspnea    GERD (gastroesophageal reflux disease)    HTN (hypertension)    Hyperlipidemia    Intrinsic asthma, unspecified    Methicillin resistant Staphylococcus aureus in conditions classified  elsewhere and of unspecified site    Other psychological or physical stress, not elsewhere classified(V62.89)    Personal history of radiation therapy    Pneumonia    Pre-diabetes    Tobacco abuse    Past Surgical History:  Procedure Laterality Date   BREAST BIOPSY Left 06/26/2022   stereo biopsy/ coil clip/ positive   BREAST LUMPECTOMY Left 07/17/2022   BREAST LUMPECTOMY,RADIO FREQ LOCALIZER,AXILLARY SENTINEL LYMPH NODE BIOPSY Left 07/17/2022   Procedure: BREAST LUMPECTOMY,RADIO FREQ LOCALIZER,AXILLARY SENTINEL LYMPH NODE BIOPSY;  Surgeon: Desiderio Schanz, MD;  Location: [facility];  Service: General;  Laterality: Left;   COLONOSCOPY WITH PROPOFOL  N/A 04/10/2015   Procedure: COLONOSCOPY WITH PROPOFOL ;  Surgeon: Rogelia Copping, MD;  Location: [facility];  Service: Endoscopy;  Laterality: N/A;   COLONOSCOPY WITH PROPOFOL  N/A 06/25/2021   Procedure: COLONOSCOPY WITH PROPOFOL ;  Surgeon: Copping Rogelia, MD;  Location: [facility];  Service: Endoscopy;  Laterality: N/A;  leave at 8 arrival   ESOPHAGOGASTRODUODENOSCOPY (EGD) WITH PROPOFOL  N/A 12/06/2021   Procedure: ESOPHAGOGASTRODUODENOSCOPY (EGD) WITH PROPOFOL ;  Surgeon: Copping Rogelia, MD;  Location: [facility];  Service: Endoscopy;  Laterality: N/A;   POLYPECTOMY  04/10/2015   Procedure: POLYPECTOMY;  Surgeon: Rogelia Copping, MD;  Location: [facility];  Service: Endoscopy;;   POLYPECTOMY N/A 06/25/2021   Procedure: POLYPECTOMY;  Surgeon: Copping Rogelia, MD;  Location: [facility];  Service: Endoscopy;  Laterality: N/A;   STATUS POST ENDOMETRIAL ABLATION     TONSILLECTOMY     TUBAL LIGATION     bilateral, sterile ablation   VARICOSE VEINS     Social History   Tobacco Use   Smoking status: Every Day    Current packs/day: 0.50    Average packs/day: 0.5 packs/day for 37.2 years (18.6 ttl pk-yrs)    Types: Cigarettes    Start date: 02/01/1987    Passive exposure: Past   Smokeless tobacco: Never   Tobacco  comments:    Currently smoking 10 cigarettes a day  Vaping Use   Vaping status: Never Used  Substance Use Topics   Alcohol use: No   Drug use: No   Allergies  Allergen Reactions   Pravastatin Sodium     Myalgia      Patient Care Team: Wellington Curtis LABOR, FNP as PCP - General (Family Medicine) Georgina Shasta POUR, RN as Oncology Nurse Navigator Lenn Aran, MD as Consulting Physician (Radiation Oncology) Desiderio Schanz, MD as Consulting Physician (General Surgery) Babara Call, MD as Consulting Physician (Oncology) Myrna Adine Anes, MD as Consulting Physician (Ophthalmology) Signa Aloysius HERO, [facility] (Pharmacist) Clista Bimler, MD as Consulting Physician (Oncology)  Outpatient Medications Prior to Visit  Medication Sig   acetaminophen  (TYLENOL ) 500 MG tablet Take 1,000 mg by mouth every 6 (six) hours as needed for mild pain.   albuterol  (VENTOLIN  HFA) 108 (90 Base) MCG/ACT inhaler Inhale 1-2 puffs into the lungs every 6 (six) hours as needed for wheezing or shortness of breath.   ALPRAZolam  (XANAX ) 0.25 MG tablet TAKE 1 TABLET BY MOUTH TWICE  DAILY AS NEEDED FOR ANXIETY  (NEED AN APPOINTMENT WITH PCP  FOR ANY FURTHER REFILLS)   amLODipine  (NORVASC ) 10 MG tablet TAKE 1 TABLET BY MOUTH DAILY   aspirin EC 81 MG tablet Take 81 mg by mouth daily. Swallow whole.   Calcium  Carb-Cholecalciferol (CALCIUM  PLUS VITAMIN D3) 600-20 MG-MCG TABS Take 1 tablet by mouth every Monday, Wednesday, and Friday.   cetirizine (ZYRTEC) 10 MG tablet Take 1 tablet (10 mg total) by mouth daily.   famotidine  (PEPCID ) 20 MG tablet Take 20 mg by mouth as needed for heartburn.   hydrochlorothiazide  (HYDRODIURIL ) 25 MG tablet TAKE 1 TABLET BY MOUTH DAILY   letrozole  (FEMARA ) 2.5 MG tablet Take 1 tablet (2.5 mg total) by mouth daily.   lisinopril  (ZESTRIL ) 40 MG tablet Take 1 tablet (40 mg total) by mouth daily.   metFORMIN  (GLUCOPHAGE -XR) 750 MG 24 hr tablet Take 1 tablet (750 mg total) by mouth daily with breakfast.    Multiple Vitamin (MULTIVITAMIN ADULT PO) Take 1 tablet by mouth daily.   PARoxetine  (PAXIL ) 10 MG tablet Take 1 tablet (10 mg total) by mouth daily.   rosuvastatin  (CRESTOR ) 40 MG tablet Take 1 tablet (40 mg total) by mouth daily.   Spacer/Aero-Holding Chambers (AEROCHAMBER MV) inhaler Use as instructed   TRELEGY ELLIPTA  100-62.5-25 MCG/ACT AEPB USE 1 INHALATION BY MOUTH DAILY   [DISCONTINUED] azithromycin  (ZITHROMAX ) 250 MG tablet Take 500 mg on day 1, followed by 250 mg once daily for 4 days   [DISCONTINUED] fluticasone  (FLONASE ) 50 MCG/ACT nasal spray Place 2 sprays into both nostrils daily.   No facility-administered medications prior to visit.    ROS        Objective:     BP (!) 117/59 (BP Location: Left Arm, Patient Position: Sitting, Cuff Size: Normal)   Pulse 69   Temp 97.7 F (36.5 C) (Oral)   Ht 5' 7 (1.702 m)   Wt 163 lb 8 oz (74.2 kg)   SpO2 99%   BMI 25.61 kg/m  BP Readings from Last 3 Encounters:  04/19/24 (!) 117/59  03/01/24 (!) 140/67  01/20/24 124/75   Wt Readings from Last 3 Encounters:  04/19/24 163 lb 8 oz (74.2 kg)  03/17/24 164 lb (74.4 kg)  12/29/23 161 lb (73 kg)      Physical Exam Constitutional:      General: She is not in acute distress.    Appearance: Normal appearance. She is normal weight. She is not ill-appearing, toxic-appearing or diaphoretic.  HENT:     Head: Normocephalic.     Right Ear: Tympanic membrane normal.     Left Ear: Tympanic membrane normal.     Nose: Nose normal.     Mouth/Throat:     Mouth: Mucous membranes are moist.     Dentition: Abnormal dentition.     Pharynx: Oropharynx is clear. No oropharyngeal exudate or posterior oropharyngeal erythema.  Eyes:     General: Lids are normal.        Right eye: No discharge.        Left eye: No discharge.  Extraocular Movements: Extraocular movements intact.     Right eye: Normal extraocular motion.     Left eye: Normal extraocular motion.     Conjunctiva/sclera:  Conjunctivae normal.     Right eye: Right conjunctiva is not injected.     Left eye: Left conjunctiva is not injected.     Pupils: Pupils are equal, round, and reactive to light.  Neck:     Thyroid : No thyroid  mass, thyromegaly or thyroid  tenderness.     Vascular: No carotid bruit.  Cardiovascular:     Rate and Rhythm: Normal rate and regular rhythm.     Pulses: Normal pulses.          Radial pulses are 2+ on the right side and 2+ on the left side.       Posterior tibial pulses are 2+ on the right side and 2+ on the left side.     Heart sounds: Normal heart sounds, S1 normal and S2 normal. No murmur heard.    No friction rub. No gallop.  Pulmonary:     Effort: Pulmonary effort is normal. No respiratory distress.     Breath sounds: Normal breath sounds. No stridor. No wheezing, rhonchi or rales.  Abdominal:     General: Bowel sounds are normal. There is no distension.     Palpations: Abdomen is soft. There is no mass.     Tenderness: There is no abdominal tenderness. There is no right CVA tenderness, left CVA tenderness, guarding or rebound.     Hernia: No hernia is present.  Musculoskeletal:        General: No swelling or tenderness. Normal range of motion.     Cervical back: Normal range of motion. No rigidity.     Right lower leg: No edema.     Left lower leg: No edema.  Lymphadenopathy:     Cervical: No cervical adenopathy.     Right cervical: No superficial, deep or posterior cervical adenopathy.    Left cervical: No superficial, deep or posterior cervical adenopathy.  Skin:    General: Skin is warm and dry.     Capillary Refill: Capillary refill takes less than 2 seconds.     Findings: No bruising or erythema.  Neurological:     General: No focal deficit present.     Mental Status: She is alert and oriented to person, place, and time. Mental status is at baseline.     GCS: GCS eye subscore is 4. GCS verbal subscore is 5. GCS motor subscore is 6.     Cranial Nerves: No  cranial nerve deficit.     Sensory: No sensory deficit.     Motor: No weakness, tremor or pronator drift.     Coordination: Romberg sign negative. Coordination normal.     Gait: Gait is intact. Gait normal.  Psychiatric:        Attention and Perception: Attention and perception normal.        Mood and Affect: Mood and affect normal.        Speech: Speech normal.        Behavior: Behavior normal. Behavior is cooperative.        Thought Content: Thought content normal.        Cognition and Memory: Cognition and memory normal.        Judgment: Judgment normal.      No results found for any visits on 04/19/24.     Assessment & Plan:    Routine Health Maintenance and  Physical Exam  Assessment and Plan Assessment & Plan Annual Physical Exam - UTD on colonoscopy - follow onc - hx of breast cancer - next mammo October 2025 - Recommend Flu and COVID vaccine during fall season - Continue daily multi vitamin - DEXA scan scheduled in October 2025  Things to do to keep yourself healthy  - Exercise at least 30-45 minutes a day, 3-4 days a week.  - Eat a low-fat diet with lots of fruits and vegetables, up to 7-9 servings per day.  - Seatbelts can save your life. Wear them always.  - Smoke detectors on every level of your home, check batteries every year.  - Eye Doctor - have an eye exam every 1-2 years  - Safe sex - if you may be exposed to STDs, use a condom.  - Alcohol -  If you drink, do it moderately, less than 2 drinks per day.  - Health Care Power of Attorney. Choose someone to speak for you if you are not able.  - Depression is common in our stressful world.If you're feeling down or losing interest in things you normally enjoy, please come in for a visit.  - Violence - If anyone is threatening or hurting you, please call immediately.  Breast cancer on adjuvant therapy Currently on adjuvant therapy since February 2024. Lung cancer screening ongoing due to smoking history. Recent  CT scan showed inflammation, treated with antibiotics. - Continue Letrozole  2.5 daily - Proceed with scheduled diagnostic mammogram in October 2025. - Follow up with oncologist for lung cancer screening and CT scan in October 2025.  Mood - Continue Paxil  10mg  daily - Continue xanax  0.25mg  daily prn  HLD - Continue Rosuvastatin  40 mg daily - Will check LP  Chronic obstructive pulmonary disease (COPD) Reports shortness of breath with heavy exertion. Continues to use daily inhalers. Breath sounds clear on auscultation, Sp02= 99% - Continue Trelegy Ellipta  daily - Continue prn albuterol   - Encourage reduction in cigarette use to six or seven per day. - Recommend referral to pulmonary for mgmt given increase in work of breathing - pt would like to hold off at this time.  Tobacco use disorder Currently smoking about half a pack of cigarettes per day. - Encourage reduction in cigarette use to six or seven per day.  Hypertension Blood pressure is well-controlled on current medication regimen. - Continue amlodipine  10mg  - Continue Lisinopril  40 mg daily - Continue HCTZ 25mg  daily  Tobacco Dependence - Pt is using half pack per day - Pt agreeable to decreasing to 6-7 cigarettes - Discussed affects on over all health and COPD - Had CT Lung scan in October - Oncology ordered.  Type 2 diabetes mellitus - Continue Metformin  750mg  daily - Recheck A1c levels. - UTD on foot exam - Has eye screening scheduled for September - Urine ACR UTD - Continue to make conscious decisions for well balanced diet smaller portions with increase protein, fruits, veggies, water  as drink of choice, decrease starches, processed foods, and saturated fats.Purposefule daily movement as tolerated  Allergic Rhinitis - Trial of Astelin  nasal spray daily each nare - Continue daily Zyrtec  Health Maintenance  Topic Date Due   COVID-19 Vaccine (1) Never done   Flu Shot  04/02/2024   Hemoglobin A1C  04/22/2024    Eye exam for diabetics  05/05/2024   Mammogram  06/08/2024   DEXA scan (bone density measurement)  09/16/2024   Yearly kidney health urinalysis for diabetes  10/23/2024   Complete foot  exam   10/23/2024   Yearly kidney function blood test for diabetes  03/01/2025   Medicare Annual Wellness Visit  03/17/2025   Colon Cancer Screening  06/25/2026   DTaP/Tdap/Td vaccine (2 - Td or Tdap) 03/18/2027   Pneumococcal Vaccine for age over 71  Completed   Hepatitis C Screening  Completed   Zoster (Shingles) Vaccine  Completed   HPV Vaccine  Aged Out   Meningitis B Vaccine  Aged Out   Pneumococcal Vaccine  Discontinued    Discussed health benefits of physical activity, and encouraged her to engage in regular exercise appropriate for her age and condition.  Annual physical exam -     CBC -     Comprehensive metabolic panel with GFR -     TSH  Type 2 diabetes mellitus without complication, without long-term current use of insulin  (HCC) -     Hemoglobin A1c -     Lipid panel  Seasonal allergic rhinitis, unspecified trigger -     Azelastine  HCl; Place 1 spray into both nostrils daily. Use in each nostril as directed  Dispense: 30 mL; Refill: 12  Hyperlipidemia associated with type 2 diabetes mellitus (HCC)  Hypertension associated with diabetes (HCC)  DM type 2, goal HbA1c < 7% (HCC)  Tobacco dependence  Mood disorder (HCC)  History of left breast cancer     Return in about 6 months (around 10/20/2024) for Chronic Disease.     Curtis DELENA Boom, FNP

## 2024-04-20 ENCOUNTER — Ambulatory Visit: Payer: Self-pay | Admitting: Family Medicine

## 2024-04-20 LAB — COMPREHENSIVE METABOLIC PANEL WITH GFR
ALT: 11 IU/L (ref 0–32)
AST: 16 IU/L (ref 0–40)
Albumin: 4.6 g/dL (ref 3.9–4.9)
Alkaline Phosphatase: 119 IU/L (ref 44–121)
BUN/Creatinine Ratio: 10 — ABNORMAL LOW (ref 12–28)
BUN: 7 mg/dL — ABNORMAL LOW (ref 8–27)
Bilirubin Total: 0.4 mg/dL (ref 0.0–1.2)
CO2: 23 mmol/L (ref 20–29)
Calcium: 10.5 mg/dL — ABNORMAL HIGH (ref 8.7–10.3)
Chloride: 96 mmol/L (ref 96–106)
Creatinine, Ser: 0.73 mg/dL (ref 0.57–1.00)
Globulin, Total: 2.3 g/dL (ref 1.5–4.5)
Glucose: 104 mg/dL — ABNORMAL HIGH (ref 70–99)
Potassium: 4.1 mmol/L (ref 3.5–5.2)
Sodium: 135 mmol/L (ref 134–144)
Total Protein: 6.9 g/dL (ref 6.0–8.5)
eGFR: 89 mL/min/1.73 (ref >59)

## 2024-04-20 LAB — LIPID PANEL
Chol/HDL Ratio: 2.6 ratio (ref 0.0–4.4)
Cholesterol, Total: 121 mg/dL (ref 100–199)
HDL: 47 mg/dL (ref >39)
LDL Chol Calc (NIH): 56 mg/dL (ref 0–99)
Triglycerides: 94 mg/dL (ref 0–149)
VLDL Cholesterol Cal: 18 mg/dL (ref 5–40)

## 2024-04-20 LAB — CBC
Hematocrit: 44.3 % (ref 34.0–46.6)
Hemoglobin: 14.5 g/dL (ref 11.1–15.9)
MCH: 28.9 pg (ref 26.6–33.0)
MCHC: 32.7 g/dL (ref 31.5–35.7)
MCV: 88 fL (ref 79–97)
Platelets: 329 x10E3/uL (ref 150–450)
RBC: 5.01 x10E6/uL (ref 3.77–5.28)
RDW: 13.4 % (ref 11.7–15.4)
WBC: 7.6 x10E3/uL (ref 3.4–10.8)

## 2024-04-20 LAB — HEMOGLOBIN A1C
Est. average glucose Bld gHb Est-mCnc: 126 mg/dL
Hgb A1c MFr Bld: 6 % — ABNORMAL HIGH (ref 4.8–5.6)

## 2024-04-20 LAB — TSH: TSH: 0.925 u[IU]/mL (ref 0.450–4.500)

## 2024-04-27 ENCOUNTER — Other Ambulatory Visit: Payer: Self-pay | Admitting: Family Medicine

## 2024-04-27 DIAGNOSIS — F39 Unspecified mood [affective] disorder: Secondary | ICD-10-CM

## 2024-05-03 ENCOUNTER — Other Ambulatory Visit: Payer: Self-pay | Admitting: Family Medicine

## 2024-05-03 DIAGNOSIS — F39 Unspecified mood [affective] disorder: Secondary | ICD-10-CM

## 2024-05-07 DIAGNOSIS — H2513 Age-related nuclear cataract, bilateral: Secondary | ICD-10-CM | POA: Diagnosis not present

## 2024-05-07 DIAGNOSIS — E119 Type 2 diabetes mellitus without complications: Secondary | ICD-10-CM | POA: Diagnosis not present

## 2024-05-07 DIAGNOSIS — L720 Epidermal cyst: Secondary | ICD-10-CM | POA: Diagnosis not present

## 2024-05-07 LAB — OPHTHALMOLOGY REPORT-SCANNED

## 2024-05-14 DIAGNOSIS — L720 Epidermal cyst: Secondary | ICD-10-CM | POA: Diagnosis not present

## 2024-05-14 DIAGNOSIS — H029 Unspecified disorder of eyelid: Secondary | ICD-10-CM | POA: Diagnosis not present

## 2024-05-24 ENCOUNTER — Emergency Department
Admission: EM | Admit: 2024-05-24 | Discharge: 2024-05-24 | Disposition: A | Discharge status: Home / Self Care | Attending: Emergency Medicine | Admitting: Emergency Medicine

## 2024-05-24 ENCOUNTER — Other Ambulatory Visit: Payer: Self-pay

## 2024-05-24 DIAGNOSIS — R55 Syncope and collapse: Secondary | ICD-10-CM | POA: Insufficient documentation

## 2024-05-24 DIAGNOSIS — E119 Type 2 diabetes mellitus without complications: Secondary | ICD-10-CM | POA: Insufficient documentation

## 2024-05-24 DIAGNOSIS — J449 Chronic obstructive pulmonary disease, unspecified: Secondary | ICD-10-CM | POA: Insufficient documentation

## 2024-05-24 DIAGNOSIS — N390 Urinary tract infection, site not specified: Secondary | ICD-10-CM | POA: Insufficient documentation

## 2024-05-24 DIAGNOSIS — I10 Essential (primary) hypertension: Secondary | ICD-10-CM | POA: Diagnosis not present

## 2024-05-24 LAB — URINALYSIS, ROUTINE W REFLEX MICROSCOPIC
Bilirubin Urine: NEGATIVE
Glucose, UA: NEGATIVE mg/dL
Hgb urine dipstick: NEGATIVE
Ketones, ur: NEGATIVE mg/dL
Leukocytes,Ua: NEGATIVE
Nitrite: POSITIVE — AB
Protein, ur: NEGATIVE mg/dL
Specific Gravity, Urine: 1.012 (ref 1.005–1.030)
Squamous Epithelial / HPF: 0 /HPF (ref 0–5)
pH: 7 (ref 5.0–8.0)

## 2024-05-24 LAB — CBG MONITORING, ED: Glucose-Capillary: 138 mg/dL — ABNORMAL HIGH (ref 70–99)

## 2024-05-24 LAB — CBC
HCT: 42.3 % (ref 36.0–46.0)
Hemoglobin: 14.4 g/dL (ref 12.0–15.0)
MCH: 29.6 pg (ref 26.0–34.0)
MCHC: 34 g/dL (ref 30.0–36.0)
MCV: 86.9 fL (ref 80.0–100.0)
Platelets: 314 K/uL (ref 150–400)
RBC: 4.87 MIL/uL (ref 3.87–5.11)
RDW: 13.2 % (ref 11.5–15.5)
WBC: 7.2 K/uL (ref 4.0–10.5)
nRBC: 0 % (ref 0.0–0.2)

## 2024-05-24 LAB — COMPREHENSIVE METABOLIC PANEL WITH GFR
ALT: 12 U/L (ref 0–44)
AST: 18 U/L (ref 15–41)
Albumin: 4.1 g/dL (ref 3.5–5.0)
Alkaline Phosphatase: 82 U/L (ref 38–126)
Anion gap: 12 (ref 5–15)
BUN: 8 mg/dL (ref 8–23)
CO2: 25 mmol/L (ref 22–32)
Calcium: 9.8 mg/dL (ref 8.9–10.3)
Chloride: 94 mmol/L — ABNORMAL LOW (ref 98–111)
Creatinine, Ser: 0.53 mg/dL (ref 0.44–1.00)
GFR, Estimated: >60 mL/min (ref >60)
Glucose, Bld: 145 mg/dL — ABNORMAL HIGH (ref 70–99)
Potassium: 3.5 mmol/L (ref 3.5–5.1)
Sodium: 131 mmol/L — ABNORMAL LOW (ref 135–145)
Total Bilirubin: 0.6 mg/dL (ref 0.0–1.2)
Total Protein: 7 g/dL (ref 6.5–8.1)

## 2024-05-24 LAB — LACTIC ACID, PLASMA: Lactic Acid, Venous: 0.9 mmol/L (ref 0.5–1.9)

## 2024-05-24 MED ORDER — CEFUROXIME AXETIL 250 MG PO TABS: 250.0000 mg | ORAL_TABLET | Freq: Two times a day (BID) | ORAL | 0 refills | Status: DC

## 2024-05-24 MED ORDER — CEFUROXIME AXETIL 250 MG PO TABS
250.0000 mg | ORAL_TABLET | Freq: Once | ORAL | Status: AC
  Administered 2024-05-24: 250 mg via ORAL
  Filled 2024-05-24: qty 1

## 2024-05-24 NOTE — ED Triage Notes (Addendum)
 Pt arrives via POV. Pt reports she had an onset of dizziness around 1045 this morning. Before the patient could sit down, she passed out and struck the back of her head. PT arrives AxOx4. Denies dizziness during triage. She reports a mild headache. Pt does not take blood thinners. No focal neurological deficits noted.

## 2024-05-24 NOTE — ED Provider Notes (Signed)
 [facility] Provider Note   Event Date/Time   First MD Initiated Contact with Patient 05/24/24 1502     (approximate) History  Loss of Consciousness  HPI Alexandria [name] is a 68 y.o. female with a past medical history of anxiety, hyperlipidemia, hypertension, COPD, type 2 diabetes who presents via private vehicle after sudden onset of lightheadedness at approximately 1045 this morning.  Patient states that she lost consciousness and struck the back of her head on a carpeted ground.  Patient now complains of mild headache and denies any trauma to the head.  Patient denies any blood thinner use.  Patient denies any new medications, recent travel, or food out of the ordinary.  Patient denies any preceding symptoms such as palpitations, diaphoresis, flushing, or nausea.  Patient states that she has felt somewhat lightheaded upon standing since this event.  Patient was told by EMS that they did orthostatic vital signs and they looked good. ROS: Patient currently denies any vision changes, tinnitus, difficulty speaking, facial droop, sore throat, chest pain, shortness of breath, abdominal pain, nausea/vomiting/diarrhea, dysuria, or weakness/numbness/paresthesias in any extremity   Physical Exam  Triage Vital Signs: ED Triage Vitals  Encounter Vitals Group     BP 05/24/24 1159 (!) 144/66     Girls Systolic BP Percentile --      Girls Diastolic BP Percentile --      Boys Systolic BP Percentile --      Boys Diastolic BP Percentile --      Pulse Rate 05/24/24 1159 73     Resp 05/24/24 1159 17     Temp 05/24/24 1159 98.5 F (36.9 C)     Temp src --      SpO2 05/24/24 1159 95 %     Weight 05/24/24 1200 160 lb (72.6 kg)     Height 05/24/24 1200 5' 7 (1.702 m)     Head Circumference --      Peak Flow --      Pain Score 05/24/24 1158 2     Pain Loc --      Pain Education --      Exclude from Growth Chart --    Most recent vital signs: Vitals:   05/24/24 1159  BP: (!)  144/66  Pulse: 73  Resp: 17  Temp: 98.5 F (36.9 C)  SpO2: 95%   General: Awake, oriented x4. CV:  Good peripheral perfusion. Resp:  Normal effort. Abd:  No distention. Other:  Elderly the well-developed, well-nourished Caucasian female resting comfortably in no acute distress.  No obvious signs of trauma to the head ED Results / Procedures / Treatments  Labs (all labs ordered are listed, but only abnormal results are displayed) Labs Reviewed  COMPREHENSIVE METABOLIC PANEL WITH GFR - Abnormal; Notable for the following components:      Result Value   Sodium 131 (*)    Chloride 94 (*)    Glucose, Bld 145 (*)    All other components within normal limits  URINALYSIS, ROUTINE W REFLEX MICROSCOPIC - Abnormal; Notable for the following components:   Color, Urine YELLOW (*)    APPearance HAZY (*)    Nitrite POSITIVE (*)    Bacteria, UA MANY (*)    All other components within normal limits  CBG MONITORING, ED - Abnormal; Notable for the following components:   Glucose-Capillary 138 (*)    All other components within normal limits  CBC  LACTIC ACID, PLASMA  LACTIC ACID, PLASMA  PROCEDURES: Critical Care performed: No Procedures MEDICATIONS ORDERED IN ED: Medications - No data to display IMPRESSION / MDM / ASSESSMENT AND PLAN / ED COURSE  I reviewed the triage vital signs and the nursing notes.                             The patient is on the cardiac monitor to evaluate for evidence of arrhythmia and/or significant heart rate changes. Patient's presentation is most consistent with acute presentation with potential threat to life or bodily function. Patient is a 68 year old female with the above-stated past medical history who presents after a syncopal episode DDx: HF, ICH, seizure, stroke, HOCM, ACS, aortic dissection, malignant arrhythmia, or GI bleed. ED Workup:  CBC, CMP, UA Findings: Urinalysis showing positive nitrites and many bacteria with no squamous cells  concerning for urinary tract infection No evidence of acute laboratory abnormalities.   EKG: No e/o STEMI. No evidence of Brugada's sign, delta wave, epsilon wave, significantly prolonged QTc, or malignant arrhythmia. Rx: Ceftin  Disposition: Discharge. Patient is at baseline at this time. Return precautions expressed and understood in person. Advised follow up with primary care provider or clinic physician in next 24 hours.   FINAL CLINICAL IMPRESSION(S) / ED DIAGNOSES   Final diagnoses:  Syncope and collapse  Urinary tract infection without hematuria, site unspecified   Rx / DC Orders   ED Discharge Orders     None      Note:  This document was prepared using Dragon voice recognition software and may include unintentional dictation errors.   Jossie Artist POUR, MD 05/24/24 [phone]

## 2024-05-26 ENCOUNTER — Ambulatory Visit (INDEPENDENT_AMBULATORY_CARE_PROVIDER_SITE_OTHER)

## 2024-05-26 VITALS — BP 130/53 | HR 73 | Resp 16 | Ht 67.0 in | Wt 163.0 lb

## 2024-05-26 DIAGNOSIS — N3000 Acute cystitis without hematuria: Secondary | ICD-10-CM | POA: Diagnosis not present

## 2024-05-26 NOTE — Progress Notes (Signed)
     Acute visit   Patient: Alexandria [name]   DOB: 14-Apr-1956   68 y.o. Female  MRN: [id number] PCP: Wellington Curtis LABOR, FNP   Chief Complaint  Patient presents with   Hospitalization Follow-up    ED follow up ( No other concerns)   Subjective     - Had a syncopal episode on 9/22 and went to the ED - Was cooking breakfast and then fell to the floor - + LOC, stuck back of head - had back ache and felt irritable - Negative orthostats at that time - had a UTI, treated with Ceftin  - now feeling much better  Review of systems as noted in HPI.   Objective    BP (!) 130/53 (BP Location: Right Arm, Patient Position: Sitting, Cuff Size: Normal)   Pulse 73   Resp 16   Ht 5' 7 (1.702 m)   Wt 163 lb (73.9 kg)   SpO2 98%   BMI 25.53 kg/m  Physical Exam Constitutional:      Appearance: Normal appearance.  HENT:     Head: Normocephalic and atraumatic.     Mouth/Throat:     Mouth: Mucous membranes are moist.  Eyes:     Pupils: Pupils are equal, round, and reactive to light.  Pulmonary:     Effort: Pulmonary effort is normal.  Skin:    General: Skin is warm.  Neurological:     General: No focal deficit present.     Mental Status: She is alert.       No results found for any visits on 05/26/24.  Assessment & Plan     Problem List Items Addressed This Visit       Genitourinary   Acute cystitis without hematuria - Primary   Patient with syncopal episode on 9/22 with + LOC. Went to the ED and found to have UTI. Reviewed EKG and labs which were reassuring. Treated with Cefitin with improvement in symptoms. Denies any further syncopal episodes. At visit today, vitals reassuring, POC urine was negative. - Recommend patient continue Cefitin as precribed - RTC if having any more syncopal episodes        No orders of the defined types were placed in this encounter.    No follow-ups on file.      Isaiah LABOR Pepper, MD  [facility] [phone]  (phone) [phone] (fax)

## 2024-05-26 NOTE — Assessment & Plan Note (Signed)
 Patient with syncopal episode on 9/22 with + LOC. Went to the ED and found to have UTI. Reviewed EKG and labs which were reassuring. Treated with Cefitin with improvement in symptoms. Denies any further syncopal episodes. At visit today, vitals reassuring, POC urine was negative. - Recommend patient continue Cefitin as precribed - RTC if having any more syncopal episodes

## 2024-06-13 ENCOUNTER — Other Ambulatory Visit: Payer: Self-pay | Admitting: Family Medicine

## 2024-06-13 DIAGNOSIS — F39 Unspecified mood [affective] disorder: Secondary | ICD-10-CM

## 2024-06-14 ENCOUNTER — Ambulatory Visit: Payer: Self-pay | Admitting: Oncology

## 2024-06-14 ENCOUNTER — Ambulatory Visit
Admission: RE | Admit: 2024-06-14 | Discharge: 2024-06-14 | Disposition: A | Source: Ambulatory Visit | Attending: Oncology

## 2024-06-14 ENCOUNTER — Other Ambulatory Visit: Payer: Self-pay

## 2024-06-14 ENCOUNTER — Telehealth: Payer: Self-pay | Admitting: Family Medicine

## 2024-06-14 ENCOUNTER — Ambulatory Visit
Admission: RE | Admit: 2024-06-14 | Discharge: 2024-06-14 | Disposition: A | Discharge status: Home / Self Care | Source: Ambulatory Visit | Attending: Oncology | Admitting: Oncology

## 2024-06-14 DIAGNOSIS — R92323 Mammographic fibroglandular density, bilateral breasts: Secondary | ICD-10-CM | POA: Diagnosis not present

## 2024-06-14 DIAGNOSIS — C50412 Malignant neoplasm of upper-outer quadrant of left female breast: Secondary | ICD-10-CM | POA: Insufficient documentation

## 2024-06-14 DIAGNOSIS — R928 Other abnormal and inconclusive findings on diagnostic imaging of breast: Secondary | ICD-10-CM | POA: Diagnosis not present

## 2024-06-14 DIAGNOSIS — Z780 Asymptomatic menopausal state: Secondary | ICD-10-CM | POA: Diagnosis not present

## 2024-06-14 DIAGNOSIS — Z79811 Long term (current) use of aromatase inhibitors: Secondary | ICD-10-CM | POA: Insufficient documentation

## 2024-06-14 DIAGNOSIS — Z170 Estrogen receptor positive status [ER+]: Secondary | ICD-10-CM | POA: Diagnosis not present

## 2024-06-14 DIAGNOSIS — Z5181 Encounter for therapeutic drug level monitoring: Secondary | ICD-10-CM | POA: Diagnosis not present

## 2024-06-14 MED ORDER — PAROXETINE HCL 10 MG PO TABS: 10.0000 mg | ORAL_TABLET | Freq: Every day | ORAL | 3 refills | Status: AC

## 2024-06-14 NOTE — Telephone Encounter (Signed)
 [facility] faxed refill request for the following medications:    PARoxetine  (PAXIL ) 10 MG tablet    Please advise.

## 2024-06-14 NOTE — Telephone Encounter (Signed)
 Converted

## 2024-06-14 NOTE — Telephone Encounter (Signed)
 LRF 07/15/23

## 2024-06-18 ENCOUNTER — Ambulatory Visit
Admission: RE | Admit: 2024-06-18 | Discharge: 2024-06-18 | Disposition: A | Discharge status: Home / Self Care | Source: Ambulatory Visit | Attending: Oncology | Admitting: Oncology

## 2024-06-18 DIAGNOSIS — R918 Other nonspecific abnormal finding of lung field: Secondary | ICD-10-CM | POA: Diagnosis not present

## 2024-06-18 DIAGNOSIS — C3412 Malignant neoplasm of upper lobe, left bronchus or lung: Secondary | ICD-10-CM | POA: Diagnosis not present

## 2024-06-18 DIAGNOSIS — J439 Emphysema, unspecified: Secondary | ICD-10-CM | POA: Diagnosis not present

## 2024-06-18 DIAGNOSIS — Z853 Personal history of malignant neoplasm of breast: Secondary | ICD-10-CM | POA: Diagnosis not present

## 2024-06-18 DIAGNOSIS — Z122 Encounter for screening for malignant neoplasm of respiratory organs: Secondary | ICD-10-CM | POA: Insufficient documentation

## 2024-06-18 DIAGNOSIS — I700 Atherosclerosis of aorta: Secondary | ICD-10-CM | POA: Diagnosis not present

## 2024-06-18 DIAGNOSIS — I2510 Atherosclerotic heart disease of native coronary artery without angina pectoris: Secondary | ICD-10-CM | POA: Diagnosis not present

## 2024-06-18 DIAGNOSIS — C3411 Malignant neoplasm of upper lobe, right bronchus or lung: Secondary | ICD-10-CM | POA: Diagnosis not present

## 2024-06-21 ENCOUNTER — Ambulatory Visit: Payer: Self-pay | Admitting: Family Medicine

## 2024-06-21 ENCOUNTER — Encounter: Payer: Self-pay | Admitting: Surgery

## 2024-06-21 ENCOUNTER — Ambulatory Visit (INDEPENDENT_AMBULATORY_CARE_PROVIDER_SITE_OTHER): Admitting: Surgery

## 2024-06-21 VITALS — BP 159/76 | HR 79 | Temp 97.5°F | Ht 67.0 in | Wt 163.4 lb

## 2024-06-21 DIAGNOSIS — C50412 Malignant neoplasm of upper-outer quadrant of left female breast: Secondary | ICD-10-CM | POA: Diagnosis not present

## 2024-06-21 DIAGNOSIS — Z170 Estrogen receptor positive status [ER+]: Secondary | ICD-10-CM

## 2024-06-21 NOTE — Patient Instructions (Signed)
 How to Do a Breast Self-Exam Doing breast self-exams can help you stay healthy. They're one way to know what's normal for your breasts. They can help you catch a problem while it's still small and can be treated. You need to: Check your breasts often. Tell your doctor about any changes. You should do breast self-exams even if you have breast implants. What you need: A mirror. A well-lit room. A pillow or other soft object. How to do a breast self-exam Look for changes  Take off all the clothes above your waist. Stand in front of a mirror in a room with good lighting. Put your hands down at your sides. Compare your breasts in the mirror. Look for difference between them, such as: Differences in shape. Differences in size. Wrinkles, dips, and bumps in one breast and not the other. Look at each breast for skin changes, such as: Redness. Scaly spots. Spots where your skin is thicker. Dimpling. Open sores. Look for changes in your nipples, such as: Fluid coming out of a nipple. Fluid around a nipple. Bleeding. Dimpling. Redness. A nipple that looks pushed in or that has changed position. Feel for changes Lie on your back. Feel each breast. To do this: Pick a breast to feel. Place a pillow under the shoulder closest to that breast. Put the arm closest to that breast behind your head. Feel the breast using the hand of your other arm. Use the pads of your three middle fingers to make small circles starting near the nipple. Use light, medium, and firm pressure. Keep making circles, moving down over the breast. Stop when you feel your ribs. Start making circles with your fingers again, this time going up until you reach your collarbone. Then, make circles out across your breast and into your armpit area. Squeeze your nipple. Check for fluid and lumps. Do these steps again to check your other breast. Sit or stand in the tub or shower. With soapy water on your skin, feel each breast  the same way you did when you were lying down. Write down what you find Writing down what you find can help you keep track of what you want to tell your doctor. Write down: What's normal for each breast. Any changes you find. Write down: The kind of change. If your breast feels tender or painful. Any lump you find. Write down its size and where it is. When you last had your period. General tips If you're breastfeeding, the best time to check your breasts is after you feed your baby or after you use a breast pump. If you get a period, the best time to check your breasts is 5-7 days after your period ends. With time, you'll get more used to doing the self-exam. You'll also start to know if there are changes in your breasts. Contact a doctor if: You see a change in the shape or size of your breasts or nipples. You see a change in the skin of your breast or nipples. You have fluid coming from your nipples that isn't normal. You find a new lump or thick area. You have breast pain. You have any concerns about your breast health. This information is not intended to replace advice given to you by your health care provider. Make sure you discuss any questions you have with your health care provider. Document Revised: 10/29/2023 Document Reviewed: 10/29/2023 Elsevier Patient Education  2025 [employer].

## 2024-06-21 NOTE — Progress Notes (Signed)
 06/21/2024  History of Present Illness: Alexandria [name] is a 68 y.o. 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  Patient completed radiation and is currently on letrozole .  Patient mammogram on 06/14/2024 which showed stable postoperative changes in the left breast without any new or suspicious findings.  The patient reports that she has been doing well and denies any pain in the left breast.  She does report still having the thickness of the skin but otherwise denies any issues.  Past Medical History: Past Medical History:  Diagnosis Date   Anxiety    Breast cancer (HCC)    Cancer (HCC)    Carbuncle and furuncle of trunk    COPD (chronic obstructive pulmonary disease) (HCC)    Depression    Diabetes mellitus without complication (HCC)    Dyspnea    GERD (gastroesophageal reflux disease)    HTN (hypertension)    Hyperlipidemia    Intrinsic asthma, unspecified    Methicillin resistant Staphylococcus aureus in conditions classified elsewhere and of unspecified site    Other psychological or physical stress, not elsewhere classified(V62.89)    Personal history of radiation therapy    Pneumonia    Pre-diabetes    Tobacco abuse      Past Surgical History: Past Surgical History:  Procedure Laterality Date   BREAST BIOPSY Left 06/26/2022   stereo biopsy/ coil clip/ positive   BREAST LUMPECTOMY Left 07/17/2022   BREAST LUMPECTOMY,RADIO FREQ LOCALIZER,AXILLARY SENTINEL LYMPH NODE BIOPSY Left 07/17/2022   Procedure: BREAST LUMPECTOMY,RADIO FREQ LOCALIZER,AXILLARY SENTINEL LYMPH NODE BIOPSY;  Surgeon: Desiderio Schanz, MD;  Location: [facility];  Service: General;  Laterality: Left;   COLONOSCOPY WITH PROPOFOL  N/A 04/10/2015   Procedure: COLONOSCOPY WITH PROPOFOL ;  Surgeon: Rogelia Copping, MD;  Location: [facility];  Service: Endoscopy;  Laterality: N/A;   COLONOSCOPY WITH PROPOFOL  N/A 06/25/2021   Procedure:  COLONOSCOPY WITH PROPOFOL ;  Surgeon: Copping Rogelia, MD;  Location: [facility];  Service: Endoscopy;  Laterality: N/A;  leave at 8 arrival   ESOPHAGOGASTRODUODENOSCOPY (EGD) WITH PROPOFOL  N/A 12/06/2021   Procedure: ESOPHAGOGASTRODUODENOSCOPY (EGD) WITH PROPOFOL ;  Surgeon: Copping Rogelia, MD;  Location: [facility];  Service: Endoscopy;  Laterality: N/A;   POLYPECTOMY  04/10/2015   Procedure: POLYPECTOMY;  Surgeon: Rogelia Copping, MD;  Location: [facility];  Service: Endoscopy;;   POLYPECTOMY N/A 06/25/2021   Procedure: POLYPECTOMY;  Surgeon: Copping Rogelia, MD;  Location: [facility];  Service: Endoscopy;  Laterality: N/A;   STATUS POST ENDOMETRIAL ABLATION     TONSILLECTOMY     TUBAL LIGATION     bilateral, sterile ablation   VARICOSE VEINS      Home Medications: Prior to Admission medications   Medication Sig Start Date End Date Taking? Authorizing Provider  acetaminophen  (TYLENOL ) 500 MG tablet Take 1,000 mg by mouth every 6 (six) hours as needed for mild pain.   Yes [provider]  albuterol  (VENTOLIN  HFA) 108 (90 Base) MCG/ACT inhaler Inhale 1-2 puffs into the lungs every 6 (six) hours as needed for wheezing or shortness of breath. 03/29/24  Yes Wellington Beams A, FNP  ALPRAZolam  (XANAX ) 0.25 MG tablet TAKE 1 TABLET BY MOUTH TWICE  DAILY AS NEEDED FOR ANXIETY 06/14/24  Yes Clifton, Kellie A, FNP  amLODipine  (NORVASC ) 10 MG tablet TAKE 1 TABLET BY MOUTH DAILY 04/09/24  Yes Wellington Beams A, FNP  aspirin EC 81 MG tablet Take 81 mg by mouth daily.  Swallow whole.   Yes [provider]  azelastine  (ASTELIN ) 0.1 % nasal spray Place 1 spray into both nostrils daily. Use in each nostril as directed 04/19/24  Yes Wellington Curtis LABOR, FNP  Calcium  Carb-Cholecalciferol (CALCIUM  PLUS VITAMIN D3) 600-20 MG-MCG TABS Take 1 tablet by mouth every Monday, Wednesday, and Friday.   Yes [provider]  cefUROXime  (CEFTIN ) 250 MG tablet Take 1 tablet (250 mg  total) by mouth 2 (two) times daily with a meal. 05/24/24  Yes Bradler, Evan K, MD  cetirizine (ZYRTEC) 10 MG tablet Take 1 tablet (10 mg total) by mouth daily. 01/13/20  Yes Vivienne Delon HERO, PA-C  famotidine  (PEPCID ) 20 MG tablet Take 20 mg by mouth as needed for heartburn.   Yes [provider]  hydrochlorothiazide  (HYDRODIURIL ) 25 MG tablet TAKE 1 TABLET BY MOUTH DAILY 09/05/23  Yes Emilio Kelly DASEN, FNP  letrozole  (FEMARA ) 2.5 MG tablet Take 1 tablet (2.5 mg total) by mouth daily. 03/01/24  Yes Babara Call, MD  lisinopril  (ZESTRIL ) 40 MG tablet Take 1 tablet (40 mg total) by mouth daily. 01/20/24  Yes Wellington Curtis LABOR, FNP  metFORMIN  (GLUCOPHAGE -XR) 750 MG 24 hr tablet Take 1 tablet (750 mg total) by mouth daily with breakfast. 01/30/24  Yes Clifton, Kellie A, FNP  Multiple Vitamin (MULTIVITAMIN ADULT PO) Take 1 tablet by mouth daily.   Yes [provider]  PARoxetine  (PAXIL ) 10 MG tablet Take 1 tablet (10 mg total) by mouth daily. 06/14/24  Yes Clifton, Kellie A, FNP  rosuvastatin  (CRESTOR ) 40 MG tablet Take 1 tablet (40 mg total) by mouth daily. 03/29/24  Yes Wellington Curtis LABOR, FNP  Spacer/Aero-Holding Chambers (AEROCHAMBER MV) inhaler Use as instructed 07/24/22  Yes Van Knee, MD  TRELEGY ELLIPTA  100-62.5-25 MCG/ACT AEPB USE 1 INHALATION BY MOUTH DAILY 03/17/24  Yes Clifton, Kellie A, FNP    Allergies: Allergies  Allergen Reactions   Pravastatin Sodium     Myalgia    Review of Systems: Review of Systems  Constitutional:  Negative for chills and fever.  Respiratory:  Negative for shortness of breath.   Cardiovascular:  Negative for chest pain.  Gastrointestinal:  Negative for nausea and vomiting.    Physical Exam BP (!) 159/76   Pulse 79   Temp (!) 97.5 F (36.4 C) (Oral)   Ht 5' 7 (1.702 m)   Wt 163 lb 6.4 oz (74.1 kg)   SpO2 96%   BMI 25.59 kg/m  CONSTITUTIONAL: No acute distress HEENT:  Normocephalic, atraumatic, extraocular motion  intact. RESPIRATORY:  Normal respiratory effort without pathologic use of accessory muscles. CARDIOVASCULAR: Regular rhythm and rate. BREAST: Left breast status post lumpectomy in the upper outer quadrant with incision well-healed.  There are skin radiation changes particularly at the nipple and areola.  Otherwise no palpable masses or other concerns.  No left axillary lymphadenopathy.  Right breast without any palpable masses, skin changes, or nipple changes.  No right axillary lymphadenopathy. NEUROLOGIC:  Motor and sensation is grossly normal.  Cranial nerves are grossly intact. PSYCH:  Alert and oriented to person, place and time. Affect is normal.  Labs/Imaging: Bilateral mammogram on 06/14/2024: FINDINGS: There is density and architectural distortion within the LEFT breast, consistent with postsurgical changes. These are stable in comparison to prior. No suspicious mass, distortion, or microcalcifications are identified to suggest presence of malignancy.   IMPRESSION: No mammographic evidence of malignancy bilaterally.   RECOMMENDATION: Per protocol, as the patient is now 2 or more years status post lumpectomy,  she may return to annual screening mammography in 1 year. However, given the history of breast cancer, the patient remains eligible for annual diagnostic mammography if preferred. (Code:[id number]-B-01Y)   I have discussed the findings and recommendations with the patient. If applicable, a reminder letter will be sent to the patient regarding the next appointment.   BI-RADS CATEGORY  2: Benign.  Assessment and Plan: This is a 68 y.o. female status post left breast lumpectomy and sentinel lymph node biopsy.  - The patient is almost 2 years from her surgery and is doing well.  Mammogram last week did not show any concerning findings and only stable postoperative changes in the left breast.  Exam today is also reassuring.  Her radiation skin changes are stable.  Discussed with the patient  that going forward we can space out our follow-ups to yearly and she will follow-up with us  next year after repeat mammogram.  She has opted to continue with diagnostic mammogram so we will order diagnostic mammogram for next year. - All of her questions have been answered.  I spent 20 minutes dedicated to the care of this patient on the date of this encounter to include pre-visit review of records, face-to-face time with the patient discussing diagnosis and management, and any post-visit coordination of care.   Aloysius Sheree Plant, MD [facility]

## 2024-06-22 ENCOUNTER — Other Ambulatory Visit: Payer: Self-pay | Admitting: Family Medicine

## 2024-06-22 DIAGNOSIS — I152 Hypertension secondary to endocrine disorders: Secondary | ICD-10-CM

## 2024-06-23 ENCOUNTER — Telehealth: Payer: Self-pay | Admitting: Family Medicine

## 2024-06-23 ENCOUNTER — Other Ambulatory Visit: Payer: Self-pay

## 2024-06-23 DIAGNOSIS — I10 Essential (primary) hypertension: Secondary | ICD-10-CM

## 2024-06-23 MED ORDER — HYDROCHLOROTHIAZIDE 25 MG PO TABS: 25.0000 mg | ORAL_TABLET | Freq: Every day | ORAL | 2 refills | Status: DC

## 2024-06-23 NOTE — Telephone Encounter (Signed)
 Medication has been sent to pharmacy.

## 2024-06-23 NOTE — Telephone Encounter (Signed)
[facility] faxed refill request for the following medications:  hydrochlorothiazide (HYDRODIURIL) 25 MG tablet    Please advise.

## 2024-06-24 ENCOUNTER — Ambulatory Visit: Payer: Self-pay | Admitting: Oncology

## 2024-07-25 ENCOUNTER — Other Ambulatory Visit: Payer: Self-pay | Admitting: Family Medicine

## 2024-07-25 DIAGNOSIS — F39 Unspecified mood [affective] disorder: Secondary | ICD-10-CM

## 2024-07-26 ENCOUNTER — Other Ambulatory Visit: Payer: Self-pay | Admitting: Family Medicine

## 2024-07-26 DIAGNOSIS — F39 Unspecified mood [affective] disorder: Secondary | ICD-10-CM

## 2024-07-26 NOTE — Telephone Encounter (Unsigned)
 Copied from CRM #[id number]. Topic: Clinical - Medication Refill >> Jul 26, 2024 12:24 PM Amy B wrote: Medication:  ALPRAZolam  (XANAX ) 0.25 MG tablet  Has the patient contacted their pharmacy? Yes (Agent: If no, request that the patient contact the pharmacy for the refill. If patient does not wish to contact the pharmacy document the reason why and proceed with request.) (Agent: If yes, when and what did the pharmacy advise?)  This is the patient's preferred pharmacy:  [facility] [place], [place] - [address] [place] [address] Phone: [phone] Fax: [phone]  Is this the correct pharmacy for this prescription? Yes If no, delete pharmacy and type the correct one.   Has the prescription been filled recently? No  Is the patient out of the medication? No  Has the patient been seen for an appointment in the last year OR does the patient have an upcoming appointment? Yes  Can we respond through MyChart? No  Agent: Please be advised that Rx refills may take up to 3 business days. We ask that you follow-up with your pharmacy.

## 2024-08-11 ENCOUNTER — Telehealth: Payer: Self-pay | Admitting: Family Medicine

## 2024-08-11 ENCOUNTER — Other Ambulatory Visit: Payer: Self-pay

## 2024-08-11 MED ORDER — METFORMIN HCL ER 750 MG PO TB24: 750.0000 mg | ORAL_TABLET | Freq: Every day | ORAL | 1 refills | Status: AC

## 2024-08-11 NOTE — Telephone Encounter (Signed)
 Refill Request  Pharmacy: [facility] Medication: metFORMIN  (GLUCOPHAGE -XR) 750 MG 24 hr tablet [[id number]] Quantity (if provided): 100 Please send in refill request.

## 2024-09-10 ENCOUNTER — Telehealth: Payer: Self-pay | Admitting: Oncology

## 2024-09-10 NOTE — Telephone Encounter (Signed)
 Pt called to confirm next appt date/time - LH

## 2024-09-13 NOTE — Progress Notes (Signed)
 Alexandria [name]                                          MRN: [id number]   09/13/2024   The [facility]BCI Quality Team Specialist reviewed this patient medical record for the purposes of chart review for care gap closure. The following were reviewed: abstraction for care gap closure-glycemic status assessment.    VBCI Quality Team

## 2024-09-14 ENCOUNTER — Telehealth: Payer: Self-pay | Admitting: Family Medicine

## 2024-09-14 NOTE — Telephone Encounter (Signed)
 [facility] faxed refill request for the following medications:   ALPRAZolam (XANAX) 0.25 MG tablet      Please advise.

## 2024-09-15 ENCOUNTER — Other Ambulatory Visit: Payer: Self-pay

## 2024-09-15 DIAGNOSIS — F39 Unspecified mood [affective] disorder: Secondary | ICD-10-CM

## 2024-09-15 MED ORDER — ALPRAZOLAM 0.25 MG PO TABS: 0.2500 mg | ORAL_TABLET | Freq: Two times a day (BID) | ORAL | 0 refills | Status: DC | PRN

## 2024-09-15 NOTE — Telephone Encounter (Signed)
 LOV 04/19/24 NOV none LRF 07/27/24 60 x 0

## 2024-09-15 NOTE — Telephone Encounter (Signed)
 Patient scheduled appt on 09/17/24 at 1pm for refill request.

## 2024-09-15 NOTE — Telephone Encounter (Signed)
 Converted

## 2024-09-17 ENCOUNTER — Ambulatory Visit

## 2024-09-17 VITALS — BP 100/75 | HR 78 | Temp 98.5°F | Ht 65.5 in | Wt 163.1 lb

## 2024-09-17 DIAGNOSIS — F419 Anxiety disorder, unspecified: Secondary | ICD-10-CM

## 2024-09-17 DIAGNOSIS — I152 Hypertension secondary to endocrine disorders: Secondary | ICD-10-CM | POA: Diagnosis not present

## 2024-09-17 DIAGNOSIS — J439 Emphysema, unspecified: Secondary | ICD-10-CM

## 2024-09-17 DIAGNOSIS — F39 Unspecified mood [affective] disorder: Secondary | ICD-10-CM

## 2024-09-17 DIAGNOSIS — Z7984 Long term (current) use of oral hypoglycemic drugs: Secondary | ICD-10-CM | POA: Diagnosis not present

## 2024-09-17 DIAGNOSIS — E119 Type 2 diabetes mellitus without complications: Secondary | ICD-10-CM

## 2024-09-17 DIAGNOSIS — Z170 Estrogen receptor positive status [ER+]: Secondary | ICD-10-CM | POA: Diagnosis not present

## 2024-09-17 DIAGNOSIS — E1159 Type 2 diabetes mellitus with other circulatory complications: Secondary | ICD-10-CM

## 2024-09-17 DIAGNOSIS — C50412 Malignant neoplasm of upper-outer quadrant of left female breast: Secondary | ICD-10-CM

## 2024-09-17 DIAGNOSIS — J449 Chronic obstructive pulmonary disease, unspecified: Secondary | ICD-10-CM | POA: Diagnosis not present

## 2024-09-17 LAB — POCT GLYCOSYLATED HEMOGLOBIN (HGB A1C): Hemoglobin A1C: 5.9 % — AB (ref 4.0–5.6)

## 2024-09-17 NOTE — Progress Notes (Signed)
 "     Established patient visit   Patient: Alexandria [name]   DOB: 1955/11/02   69 y.o. Female  MRN: [id number] Visit Date: 09/17/2024  Today's healthcare provider: Isaiah DELENA Pepper, MD   Chief Complaint  Patient presents with   Establish Care   Subjective    HPI  Discussed the use of AI scribe software for clinical note transcription with the patient, who gave verbal consent to proceed.  History of Present Illness Alexandria [name] is a 68 year old female who presents for medication management and follow-up.  She takes Xanax  one to two times daily for anxiety and Paxil  at night to help with sleep due to tension in her neck. Paxil  helps her relax and sleep, and Xanax  also aids in relaxation. She is awaiting a refill of Xanax , which is scheduled to arrive by mail between Monday and Wednesday.  She has a history of diabetes and is currently taking metformin  once daily in the evening to avoid gastrointestinal upset. Her last A1c was 6.0, and today it is 5.9. She has been compliant with her medication regimen and reports no missed doses. She drinks a lot of water  and occasionally consumes small amounts of [facility].  She is on multiple medications for hypertension, including amlodipine , lisinopril , and hydrochlorothiazide .   She takes rosuvastatin  for cholesterol management and uses Trelegy daily for respiratory issues. She has an albuterol  inhaler but uses it infrequently due to it causing a dry tickle in her throat, using it only once in the past three to four months.  She is currently on letrozole  for breast cancer treatment, which she will continue for five years. She has follow-up appointments with her oncologist and other specialists scheduled.  She received her flu vaccine in October at [facility].   Medications: Show/hide medication list[1]  Review of Systems as noted in HPI.      Objective    BP 100/75   Pulse 78   Temp 98.5 F (36.9 C) (Oral)   Ht 5' 5.5 (1.664 m)   Wt 163  lb 1.6 oz (74 kg)   SpO2 98%   BMI 26.73 kg/m    Physical Exam Constitutional:      Appearance: Normal appearance.  HENT:     Head: Normocephalic and atraumatic.     Mouth/Throat:     Mouth: Mucous membranes are moist.  Eyes:     Pupils: Pupils are equal, round, and reactive to light.  Pulmonary:     Effort: Pulmonary effort is normal.  Skin:    General: Skin is warm.  Neurological:     General: No focal deficit present.     Mental Status: She is alert.      Results for orders placed or performed in visit on 09/17/24  POCT HgB A1C  Result Value Ref Range   Hemoglobin A1C 5.9 (A) 4.0 - 5.6 %   HbA1c POC (<> result, manual entry)     HbA1c, POC (prediabetic range)     HbA1c, POC (controlled diabetic range)      Assessment & Plan     Problem List Items Addressed This Visit       Cardiovascular and Mediastinum   Hypertension associated with diabetes (HCC)     Respiratory   Chronic obstructive pulmonary disease with emphysema, unspecified emphysema type (HCC)     Endocrine   Diabetes mellitus (HCC) - Primary   Relevant Orders   POCT HgB A1C (Completed)     Other  Breast cancer, left (HCC) (Chronic)   Relevant Medications   ALPRAZolam  (XANAX ) 0.25 MG tablet (Start on 10/16/2024)   ALPRAZolam  (XANAX ) 0.25 MG tablet (Start on 11/13/2024)   Anxiety   Relevant Medications   ALPRAZolam  (XANAX ) 0.25 MG tablet (Start on 10/16/2024)   ALPRAZolam  (XANAX ) 0.25 MG tablet (Start on 11/13/2024)   Assessment & Plan Type 2 diabetes mellitus (HCC) Well-controlled with A1c of 5.9. Adheres to metformin  and balanced diet. - Continue metformin  once daily in the evening. - Scheduled follow-up in three months.  Hypertension associated with diabetes (HCC) Well-controlled with amlodipine , lisinopril , and hydrochlorothiazide . Stable blood pressure readings. - Continue current antihypertensive regimen.  Chronic obstructive pulmonary disease (HCC) Chronic, controlled. Managed with  Trelegy and albuterol  as needed. - Continue Trelegy daily. - Use albuterol  as needed.  Anxiety Managed with Paxil  and Xanax . Patient takes Xanax  1-2x daily as needed for anxiety. PDMP appropriate. - Continue Xanax  as prescribed, refills scheduled for February and March. - Continue Paxil  at night. - Follow up in 3 months  Breast cancer, on aromatase inhibitor (HCC) Managed with letrozole . Regular follow-ups with oncologist. - Continue letrozole  as prescribed. - Follow up with oncologist on January 26th.   Return in about 3 months (around 12/16/2024) for Follow Up.       Isaiah DELENA Pepper, MD  [facility] [phone] (phone) [phone] (fax)     [1]  Outpatient Medications Prior to Visit  Medication Sig   acetaminophen  (TYLENOL ) 500 MG tablet Take 1,000 mg by mouth every 6 (six) hours as needed for mild pain.   albuterol  (VENTOLIN  HFA) 108 (90 Base) MCG/ACT inhaler Inhale 1-2 puffs into the lungs every 6 (six) hours as needed for wheezing or shortness of breath.   amLODipine  (NORVASC ) 10 MG tablet TAKE 1 TABLET BY MOUTH DAILY   aspirin EC 81 MG tablet Take 81 mg by mouth daily. Swallow whole.   azelastine  (ASTELIN ) 0.1 % nasal spray Place 1 spray into both nostrils daily. Use in each nostril as directed   Calcium  Carb-Cholecalciferol (CALCIUM  PLUS VITAMIN D3) 600-20 MG-MCG TABS Take 1 tablet by mouth every Monday, Wednesday, and Friday.   cetirizine (ZYRTEC) 10 MG tablet Take 1 tablet (10 mg total) by mouth daily.   famotidine  (PEPCID ) 20 MG tablet Take 20 mg by mouth as needed for heartburn.   hydrochlorothiazide  (HYDRODIURIL ) 25 MG tablet Take 1 tablet (25 mg total) by mouth daily.   letrozole  (FEMARA ) 2.5 MG tablet Take 1 tablet (2.5 mg total) by mouth daily.   lisinopril  (ZESTRIL ) 40 MG tablet TAKE 1 TABLET BY MOUTH DAILY   metFORMIN  (GLUCOPHAGE -XR) 750 MG 24 hr tablet Take 1 tablet (750 mg total) by mouth daily with breakfast.   Multiple  Vitamin (MULTIVITAMIN ADULT PO) Take 1 tablet by mouth daily.   PARoxetine  (PAXIL ) 10 MG tablet Take 1 tablet (10 mg total) by mouth daily.   rosuvastatin  (CRESTOR ) 40 MG tablet TAKE 1 TABLET BY MOUTH DAILY   Spacer/Aero-Holding Chambers (AEROCHAMBER MV) inhaler Use as instructed   TRELEGY ELLIPTA  100-62.5-25 MCG/ACT AEPB USE 1 INHALATION BY MOUTH DAILY   [DISCONTINUED] ALPRAZolam  (XANAX ) 0.25 MG tablet Take 1 tablet (0.25 mg total) by mouth 2 (two) times daily as needed. for anxiety   [DISCONTINUED] cefUROXime  (CEFTIN ) 250 MG tablet Take 1 tablet (250 mg total) by mouth 2 (two) times daily with a meal. (Patient not taking: Reported on 09/17/2024)   No facility-administered medications prior to visit.   "

## 2024-09-18 MED ORDER — ALPRAZOLAM 0.25 MG PO TABS: 0.2500 mg | ORAL_TABLET | Freq: Two times a day (BID) | ORAL | 0 refills | Status: AC | PRN

## 2024-09-23 ENCOUNTER — Telehealth: Payer: Self-pay | Admitting: Oncology

## 2024-09-23 NOTE — Telephone Encounter (Signed)
 Due to weather MD is doing virtual only on Monday. I spoke with pt and labs are scheduled for tomorrow and virtual for Monday.

## 2024-09-24 ENCOUNTER — Inpatient Hospital Stay: Attending: Oncology

## 2024-09-24 ENCOUNTER — Encounter: Payer: Self-pay | Admitting: Oncology

## 2024-09-24 DIAGNOSIS — Z5181 Encounter for therapeutic drug level monitoring: Secondary | ICD-10-CM

## 2024-09-24 DIAGNOSIS — C50412 Malignant neoplasm of upper-outer quadrant of left female breast: Secondary | ICD-10-CM

## 2024-09-24 LAB — CMP (CANCER CENTER ONLY)
ALT: 10 U/L (ref 0–44)
AST: 18 U/L (ref 15–41)
Albumin: 4.4 g/dL (ref 3.5–5.0)
Alkaline Phosphatase: 107 U/L (ref 38–126)
Anion gap: 11 (ref 5–15)
BUN: 7 mg/dL — ABNORMAL LOW (ref 8–23)
CO2: 26 mmol/L (ref 22–32)
Calcium: 10.5 mg/dL — ABNORMAL HIGH (ref 8.9–10.3)
Chloride: 91 mmol/L — ABNORMAL LOW (ref 98–111)
Creatinine: 0.63 mg/dL (ref 0.44–1.00)
GFR, Estimated: >60 mL/min
Glucose, Bld: 135 mg/dL — ABNORMAL HIGH (ref 70–99)
Potassium: 3.9 mmol/L (ref 3.5–5.1)
Sodium: 128 mmol/L — ABNORMAL LOW (ref 135–145)
Total Bilirubin: 0.3 mg/dL (ref 0.0–1.2)
Total Protein: 6.8 g/dL (ref 6.5–8.1)

## 2024-09-24 LAB — CBC WITH DIFFERENTIAL (CANCER CENTER ONLY)
Abs Immature Granulocytes: 0.02 K/uL (ref 0.00–0.07)
Basophils Absolute: 0.1 K/uL (ref 0.0–0.1)
Basophils Relative: 1 %
Eosinophils Absolute: 0.1 K/uL (ref 0.0–0.5)
Eosinophils Relative: 1 %
HCT: 40.1 % (ref 36.0–46.0)
Hemoglobin: 13.4 g/dL (ref 12.0–15.0)
Immature Granulocytes: 0 %
Lymphocytes Relative: 18 %
Lymphs Abs: 1.2 K/uL (ref 0.7–4.0)
MCH: 28.9 pg (ref 26.0–34.0)
MCHC: 33.4 g/dL (ref 30.0–36.0)
MCV: 86.6 fL (ref 80.0–100.0)
Monocytes Absolute: 0.6 K/uL (ref 0.1–1.0)
Monocytes Relative: 8 %
Neutro Abs: 4.9 K/uL (ref 1.7–7.7)
Neutrophils Relative %: 72 %
Platelet Count: 268 K/uL (ref 150–400)
RBC: 4.63 MIL/uL (ref 3.87–5.11)
RDW: 13.2 % (ref 11.5–15.5)
WBC Count: 6.8 K/uL (ref 4.0–10.5)
nRBC: 0 % (ref 0.0–0.2)

## 2024-09-25 ENCOUNTER — Emergency Department

## 2024-09-25 ENCOUNTER — Emergency Department: Admission: EM | Admit: 2024-09-25 | Discharge: 2024-09-25 | Disposition: A | Discharge status: Home / Self Care

## 2024-09-25 ENCOUNTER — Ambulatory Visit
Admission: EM | Admit: 2024-09-25 | Discharge: 2024-09-25 | Disposition: A | Discharge status: Home / Self Care | Attending: Family Medicine | Admitting: Family Medicine

## 2024-09-25 ENCOUNTER — Encounter: Payer: Self-pay | Admitting: Emergency Medicine

## 2024-09-25 ENCOUNTER — Other Ambulatory Visit: Payer: Self-pay

## 2024-09-25 DIAGNOSIS — E119 Type 2 diabetes mellitus without complications: Secondary | ICD-10-CM | POA: Insufficient documentation

## 2024-09-25 DIAGNOSIS — E871 Hypo-osmolality and hyponatremia: Secondary | ICD-10-CM | POA: Diagnosis not present

## 2024-09-25 DIAGNOSIS — R350 Frequency of micturition: Secondary | ICD-10-CM | POA: Diagnosis present

## 2024-09-25 DIAGNOSIS — R55 Syncope and collapse: Secondary | ICD-10-CM | POA: Insufficient documentation

## 2024-09-25 DIAGNOSIS — R519 Headache, unspecified: Secondary | ICD-10-CM | POA: Diagnosis not present

## 2024-09-25 DIAGNOSIS — N390 Urinary tract infection, site not specified: Secondary | ICD-10-CM | POA: Diagnosis not present

## 2024-09-25 DIAGNOSIS — N3000 Acute cystitis without hematuria: Secondary | ICD-10-CM | POA: Diagnosis not present

## 2024-09-25 LAB — CBC
HCT: 43.5 % (ref 36.0–46.0)
Hemoglobin: 14.5 g/dL (ref 12.0–15.0)
MCH: 28.7 pg (ref 26.0–34.0)
MCHC: 33.3 g/dL (ref 30.0–36.0)
MCV: 86.1 fL (ref 80.0–100.0)
Platelets: 271 10*3/uL (ref 150–400)
RBC: 5.05 MIL/uL (ref 3.87–5.11)
RDW: 13.2 % (ref 11.5–15.5)
WBC: 5.3 10*3/uL (ref 4.0–10.5)
nRBC: 0 % (ref 0.0–0.2)

## 2024-09-25 LAB — COMPREHENSIVE METABOLIC PANEL WITH GFR
ALT: 10 U/L (ref 0–44)
AST: 19 U/L (ref 15–41)
Albumin: 4.3 g/dL (ref 3.5–5.0)
Alkaline Phosphatase: 111 U/L (ref 38–126)
Anion gap: 10 (ref 5–15)
BUN: 7 mg/dL — ABNORMAL LOW (ref 8–23)
CO2: 27 mmol/L (ref 22–32)
Calcium: 10.4 mg/dL — ABNORMAL HIGH (ref 8.9–10.3)
Chloride: 91 mmol/L — ABNORMAL LOW (ref 98–111)
Creatinine, Ser: 0.67 mg/dL (ref 0.44–1.00)
GFR, Estimated: >60 mL/min
Glucose, Bld: 174 mg/dL — ABNORMAL HIGH (ref 70–99)
Potassium: 3.9 mmol/L (ref 3.5–5.1)
Sodium: 129 mmol/L — ABNORMAL LOW (ref 135–145)
Total Bilirubin: 0.3 mg/dL (ref 0.0–1.2)
Total Protein: 6.9 g/dL (ref 6.5–8.1)

## 2024-09-25 LAB — POCT URINE DIPSTICK
Bilirubin, UA: NEGATIVE
Glucose, UA: NEGATIVE mg/dL
Ketones, POC UA: NEGATIVE mg/dL
Nitrite, UA: POSITIVE — AB
POC PROTEIN,UA: NEGATIVE
Spec Grav, UA: 1.01
Urobilinogen, UA: 1 U/dL
pH, UA: 6

## 2024-09-25 LAB — CBG MONITORING, ED: Glucose-Capillary: 170 mg/dL — ABNORMAL HIGH (ref 70–99)

## 2024-09-25 MED ORDER — CEPHALEXIN 500 MG PO CAPS: 500.0000 mg | ORAL_CAPSULE | Freq: Two times a day (BID) | ORAL | 0 refills | Status: DC

## 2024-09-25 MED ORDER — ACETAMINOPHEN 325 MG PO TABS
650.0000 mg | ORAL_TABLET | Freq: Once | ORAL | Status: AC
  Administered 2024-09-25: 650 mg via ORAL
  Filled 2024-09-25: qty 2

## 2024-09-25 MED ORDER — SODIUM CHLORIDE 0.9 % IV SOLN
1.0000 g | Freq: Once | INTRAVENOUS | Status: AC
  Administered 2024-09-25: 1 g via INTRAVENOUS
  Filled 2024-09-25: qty 10

## 2024-09-25 MED ORDER — CEPHALEXIN 500 MG PO CAPS: 500.0000 mg | ORAL_CAPSULE | Freq: Two times a day (BID) | ORAL | 0 refills | Status: AC

## 2024-09-25 NOTE — Discharge Instructions (Addendum)
 You were given your loss of loss of consciousness, you were advised to go the emergency department.   You have been advised to follow up immediately in the emergency department for concerning signs or symptoms as discussed during your visit. If you declined EMS transport, please have a family member take you directly to the ED at this time. Do not delay.   Based on concerns about condition, if you do not follow up in the ED, you may risk poor outcomes including worsening of condition, delayed treatment and potentially life threatening issues. If you have declined to go to the ED at this time, you should call your PCP immediately to set up a follow up appointment.

## 2024-09-25 NOTE — ED Provider Notes (Signed)
 " [facility]    CSN: [id number] Arrival date & time: 09/25/24  9173      History   Chief Complaint Chief Complaint  Patient presents with   Loss of Consciousness   Urinary Frequency   Emesis     HPI HPI Alexandria [name] is a 69 y.o. female.    Alexandria [name] presents for for loss of consciousness.  She was found by her grandson.  Her head was on the table and she was not able to recall the events of how she got there.  She felt dizzy and lightheaded.  She got back in the bed. She was very thirsty and shaky so drank mountain dew.  She then started vomiting.  Daughter is concerned that her mom may have a urinary tract infection again as she has been urinating more in the past day.   Her blood pressure was elevated around 7 AM to 180/70.  She follows with the cancer center for breast cancer.    Past Medical History:  Diagnosis Date   Anxiety    Breast cancer (HCC)    Cancer (HCC)    Carbuncle and furuncle of trunk    COPD (chronic obstructive pulmonary disease) (HCC)    Depression    Diabetes mellitus without complication (HCC)    Dyspnea    GERD (gastroesophageal reflux disease)    HTN (hypertension)    Hyperlipidemia    Intrinsic asthma, unspecified    Methicillin resistant Staphylococcus aureus in conditions classified elsewhere and of unspecified site    Other psychological or physical stress, not elsewhere classified(V62.89)    Personal history of radiation therapy    Pneumonia    Pre-diabetes    Tobacco abuse     Patient Active Problem List   Diagnosis Date Noted   Acute cystitis without hematuria 05/26/2024   Psychophysiological insomnia 07/15/2023   Mood disorder 06/17/2023   Tobacco dependence 04/16/2023   Chronic obstructive pulmonary disease with emphysema, unspecified emphysema type (HCC) 08/12/2022   Long term current use of aromatase inhibitor 08/08/2022   Diabetes mellitus (HCC) 07/28/2022   Breast cancer, left (HCC) 07/01/2022    Hypertension associated with diabetes (HCC) 04/11/2022   Hyperlipidemia associated with type 2 diabetes mellitus (HCC) 04/11/2022   History of colonic polyps    Anxiety    Tobacco use     Past Surgical History:  Procedure Laterality Date   BREAST BIOPSY Left 06/26/2022   stereo biopsy/ coil clip/ positive   BREAST LUMPECTOMY Left 07/17/2022   BREAST LUMPECTOMY,RADIO FREQ LOCALIZER,AXILLARY SENTINEL LYMPH NODE BIOPSY Left 07/17/2022   Procedure: BREAST LUMPECTOMY,RADIO FREQ LOCALIZER,AXILLARY SENTINEL LYMPH NODE BIOPSY;  Surgeon: Desiderio Schanz, MD;  Location: [facility];  Service: General;  Laterality: Left;   COLONOSCOPY WITH PROPOFOL  N/A 04/10/2015   Procedure: COLONOSCOPY WITH PROPOFOL ;  Surgeon: Rogelia Copping, MD;  Location: [facility];  Service: Endoscopy;  Laterality: N/A;   COLONOSCOPY WITH PROPOFOL  N/A 06/25/2021   Procedure: COLONOSCOPY WITH PROPOFOL ;  Surgeon: Copping Rogelia, MD;  Location: [facility];  Service: Endoscopy;  Laterality: N/A;  leave at 8 arrival   ESOPHAGOGASTRODUODENOSCOPY (EGD) WITH PROPOFOL  N/A 12/06/2021   Procedure: ESOPHAGOGASTRODUODENOSCOPY (EGD) WITH PROPOFOL ;  Surgeon: Copping Rogelia, MD;  Location: [facility];  Service: Endoscopy;  Laterality: N/A;   POLYPECTOMY  04/10/2015   Procedure: POLYPECTOMY;  Surgeon: Rogelia Copping, MD;  Location: [facility];  Service: Endoscopy;;   POLYPECTOMY N/A 06/25/2021   Procedure: POLYPECTOMY;  Surgeon: Copping Rogelia,  MD;  Location: [facility];  Service: Endoscopy;  Laterality: N/A;   STATUS POST ENDOMETRIAL ABLATION     TONSILLECTOMY     TUBAL LIGATION     bilateral, sterile ablation   VARICOSE VEINS      OB History     Gravida  3   Para  3   Term      Preterm      AB      Living         SAB      IAB      Ectopic      Multiple      Live Births               Home Medications    Prior to Admission medications  Medication Sig Start Date End Date Taking?  Authorizing Provider  acetaminophen  (TYLENOL ) 500 MG tablet Take 1,000 mg by mouth every 6 (six) hours as needed for mild pain.    [provider]  albuterol  (VENTOLIN  HFA) 108 (90 Base) MCG/ACT inhaler Inhale 1-2 puffs into the lungs every 6 (six) hours as needed for wheezing or shortness of breath. 03/29/24   Wellington Curtis LABOR, FNP  ALPRAZolam  (XANAX ) 0.25 MG tablet Take 1 tablet (0.25 mg total) by mouth 2 (two) times daily as needed. for anxiety 10/16/24 11/15/24  Franchot Isaiah LABOR, MD  ALPRAZolam  (XANAX ) 0.25 MG tablet Take 1 tablet (0.25 mg total) by mouth 2 (two) times daily as needed. for anxiety Patient not taking: Reported on 09/24/2024 11/13/24 12/13/24  Franchot Isaiah LABOR, MD  amLODipine  (NORVASC ) 10 MG tablet TAKE 1 TABLET BY MOUTH DAILY 04/09/24   Wellington Curtis A, FNP  aspirin EC 81 MG tablet Take 81 mg by mouth daily. Swallow whole.    [provider]  azelastine  (ASTELIN ) 0.1 % nasal spray Place 1 spray into both nostrils daily. Use in each nostril as directed 04/19/24   Wellington Curtis LABOR, FNP  Calcium  Carb-Cholecalciferol (CALCIUM  PLUS VITAMIN D3) 600-20 MG-MCG TABS Take 1 tablet by mouth every Monday, Wednesday, and Friday.    [provider]  cetirizine (ZYRTEC) 10 MG tablet Take 1 tablet (10 mg total) by mouth daily. 01/13/20   Vivienne Delon HERO, PA-C  famotidine  (PEPCID ) 20 MG tablet Take 20 mg by mouth as needed for heartburn.    [provider]  hydrochlorothiazide  (HYDRODIURIL ) 25 MG tablet Take 1 tablet (25 mg total) by mouth daily. 06/23/24   Clifton, Kellie A, FNP  letrozole  (FEMARA ) 2.5 MG tablet Take 1 tablet (2.5 mg total) by mouth daily. 03/01/24   Babara Call, MD  lisinopril  (ZESTRIL ) 40 MG tablet TAKE 1 TABLET BY MOUTH DAILY 06/23/24   Clifton, Kellie A, FNP  metFORMIN  (GLUCOPHAGE -XR) 750 MG 24 hr tablet Take 1 tablet (750 mg total) by mouth daily with breakfast. 08/11/24   Bacigalupo, Angela M, MD  Multiple Vitamin (MULTIVITAMIN ADULT PO)  Take 1 tablet by mouth daily.    [provider]  PARoxetine  (PAXIL ) 10 MG tablet Take 1 tablet (10 mg total) by mouth daily. 06/14/24   Clifton, Kellie A, FNP  rosuvastatin  (CRESTOR ) 40 MG tablet TAKE 1 TABLET BY MOUTH DAILY 06/23/24   Wellington Curtis LABOR, FNP  Spacer/Aero-Holding Chambers (AEROCHAMBER MV) inhaler Use as instructed 07/24/22   Van Knee, MD  TRELEGY ELLIPTA  100-62.5-25 MCG/ACT AEPB USE 1 INHALATION BY MOUTH DAILY 03/17/24   Wellington Curtis LABOR, FNP    Family History Family History  Problem Relation Age  of Onset   Coronary artery disease Mother    Hypertension Mother    Rheum arthritis Mother    Heart attack Mother    Lung cancer Father    Emphysema Father    Gout Father    Diabetes Sister    Hypertension Daughter    Obesity Daughter    Hypertension Daughter    Obesity Daughter    Obesity Daughter    Seizures Daughter    Breast cancer Neg Hx    Bladder Cancer Neg Hx    Kidney cancer Neg Hx     Social History Social History[1]   Allergies   Pravastatin sodium   Review of Systems Review of Systems: :negative unless otherwise stated in HPI.      Physical Exam Triage Vital Signs ED Triage Vitals  Encounter Vitals Group     BP 09/25/24 0845 132/66     Girls Systolic BP Percentile --      Girls Diastolic BP Percentile --      Boys Systolic BP Percentile --      Boys Diastolic BP Percentile --      Pulse Rate 09/25/24 0845 88     Resp --      Temp 09/25/24 0845 97.7 F (36.5 C)     Temp Source 09/25/24 0845 Oral     SpO2 09/25/24 0845 100 %     Weight --      Height --      Head Circumference --      Peak Flow --      Pain Score 09/25/24 0844 7     Pain Loc --      Pain Education --      Exclude from Growth Chart --    No data found.  Updated Vital Signs BP 132/66 (BP Location: Right Arm)   Pulse 88   Temp 97.7 F (36.5 C) (Oral)   SpO2 100%   Visual Acuity Right Eye Distance:   Left Eye Distance:   Bilateral  Distance:    Right Eye Near:   Left Eye Near:    Bilateral Near:     Physical Exam GEN: well appearing female in no acute distress  CVS: well perfused, regular rate and rhythm RESP: speaking in full sentences without pause, clear     UC Treatments / Results  Labs (all labs ordered are listed, but only abnormal results are displayed) Labs Reviewed  POCT URINE DIPSTICK - Abnormal; Notable for the following components:      Result Value   Blood, UA small (*)    Nitrite, UA Positive (*)    Leukocytes, UA Trace (*)    All other components within normal limits  URINALYSIS, ROUTINE W REFLEX MICROSCOPIC    EKG   Radiology No results found.  Procedures Procedures (including critical care time)  Medications Ordered in UC Medications - No data to display  Initial Impression / Assessment and Plan / UC Course  I have reviewed the triage vital signs and the nursing notes.  Pertinent labs & imaging results that were available during my care of the patient were reviewed by me and considered in my medical decision making (see chart for details).        Patient is a 69 y.o. female  who presents for after loss of consciousness and collapse with urinary frequency.  Overall patient is well-appearing and afebrile.  Vital signs stable.   POC urine dipstick obtained by nursing staff and is concerning  for UTI.  On review of her labs from yesterday her sodium is 128.  This may be a cause of her syncope.  Advise ED evaluation as she may need a cardiac versus neurogenic workup as well as evaluation for her hyponatremia.    Daughter is reluctant to take her mother to the ED as she is going to be around lots of sick people.  Advised them to wear a mask.  She was requesting antibiotics however this is not a safe option for her mother.  We discussed this and she is willing to take her mother to [facility] ED for further evaluation.  Discussed MDM, treatment plan and plan for follow-up with patient and  her daughter who agrees with plan.        Final Clinical Impressions(s) / UC Diagnoses   Final diagnoses:  Lower urinary tract infectious disease  Syncope and collapse  Hyponatremia     Discharge Instructions      You were given your loss of loss of consciousness, you were advised to go the emergency department.   You have been advised to follow up immediately in the emergency department for concerning signs or symptoms as discussed during your visit. If you declined EMS transport, please have a family member take you directly to the ED at this time. Do not delay.   Based on concerns about condition, if you do not follow up in the ED, you may risk poor outcomes including worsening of condition, delayed treatment and potentially life threatening issues. If you have declined to go to the ED at this time, you should call your PCP immediately to set up a follow up appointment.      ED Prescriptions   None    PDMP not reviewed this encounter.     [1]  Social History Tobacco Use   Smoking status: Every Day    Current packs/day: 0.50    Average packs/day: 0.5 packs/day for 37.6 years (18.8 ttl pk-yrs)    Types: Cigarettes    Start date: 02/01/1987    Passive exposure: Past   Smokeless tobacco: Never   Tobacco comments:    Currently smoking 10 cigarettes a day  Vaping Use   Vaping status: Never Used  Substance Use Topics   Alcohol use: No   Drug use: No     Kriste Berth, DO 09/25/24 0957  "

## 2024-09-25 NOTE — ED Notes (Signed)
 Pt given DC instructions. Pt verbalized understanding of medication and follow up care. Pt ambulatory from Ed without difficulty.

## 2024-09-25 NOTE — ED Provider Notes (Signed)
 "  [facility] Provider Note    Event Date/Time   First MD Initiated Contact with Patient 09/25/24 1051     (approximate)   History   Loss of Consciousness and Urinary Frequency   HPI  Alexandria [name] is a 69 y.o. female with past medical history of diabetes presenting to the emergency department after syncopal episode around 430 this morning.  Patient reports that she got up to go to the bathroom and then slumped over onto her bedside table.  She reports that she did not fall to the floor and was able to sit up and tell her grandson what it happened.  Patient endorses a headache as well as dizziness that she reports she has had the dizziness for several months.  Patient is concerned that she has a UTI because she reports that the last time she had symptoms like this including the syncope she had a urine infection.  Denies any chest pain or shortness of breath.     Physical Exam   Triage Vital Signs: ED Triage Vitals  Encounter Vitals Group     BP 09/25/24 1038 (!) 146/64     Girls Systolic BP Percentile --      Girls Diastolic BP Percentile --      Boys Systolic BP Percentile --      Boys Diastolic BP Percentile --      Pulse Rate 09/25/24 1038 82     Resp 09/25/24 1038 18     Temp 09/25/24 1038 98.4 F (36.9 C)     Temp Source 09/25/24 1038 Oral     SpO2 09/25/24 1038 95 %     Weight 09/25/24 1043 163 lb (73.9 kg)     Height 09/25/24 1043 5' 5.5 (1.664 m)     Head Circumference --      Peak Flow --      Pain Score 09/25/24 1042 7     Pain Loc --      Pain Education --      Exclude from Growth Chart --     Most recent vital signs: Vitals:   09/25/24 1038 09/25/24 1100  BP: (!) 146/64 99/82  Pulse: 82 68  Resp: 18 15  Temp: 98.4 F (36.9 C) 98.4 F (36.9 C)  SpO2: 95% 100%     General: Awake, no distress.  CV:  Good peripheral perfusion.  Resp:  Normal effort.  Abd:  No distention.  Other:     ED Results / Procedures / Treatments    Labs (all labs ordered are listed, but only abnormal results are displayed) Labs Reviewed  COMPREHENSIVE METABOLIC PANEL WITH GFR - Abnormal; Notable for the following components:      Result Value   Sodium 129 (*)    Chloride 91 (*)    Glucose, Bld 174 (*)    BUN 7 (*)    Calcium  10.4 (*)    All other components within normal limits  CBG MONITORING, ED - Abnormal; Notable for the following components:   Glucose-Capillary 170 (*)    All other components within normal limits  URINE CULTURE  CBC     EKG  ED ECG REPORT I, Reche CHRISTELLA Leventhal, the attending physician, personally viewed and interpreted this ECG.  Date: 09/25/2024 Rate: 72 Rhythm: normal sinus rhythm QRS Axis: Rightward axis Intervals: normal ST/T Wave abnormalities: normal Narrative Interpretation: no evidence of acute ischemia    RADIOLOGY Head CT:  IMPRESSION: 1. No acute intracranial  hemorrhage or calvarial fracture.    PROCEDURES:  Critical Care performed: No  Procedures   MEDICATIONS ORDERED IN ED: Medications  cefTRIAXone  (ROCEPHIN ) 1 g in sodium chloride  0.9 % 100 mL IVPB (0 g Intravenous Stopped 09/25/24 1238)  acetaminophen  (TYLENOL ) tablet 650 mg (650 mg Oral Given 09/25/24 1233)     IMPRESSION / MDM / ASSESSMENT AND PLAN / ED COURSE  I reviewed the triage vital signs and the nursing notes.                              Differential diagnosis includes, but is not limited to, benign peripheral vertigo, central vertigo (CVA/TIA), Mnire's disease, posterior circulation insufficiency/occlusion, intracranial hemorrhage, intoxication, medication effects, volume depletion/dehydration, metabolic disturbance, infection/sepsis, etc.   Patient's presentation is most consistent with acute presentation with potential threat to life or bodily function.  Patient is a 69 year old female with past medical history of diabetes presenting to the emergency department after syncopal episode.  Head  CT was performed and was negative.  The patient was noted to have a UTI as she reported she has had in the past with similar symptoms and was treated with a dose of Rocephin .  Patient was also noted to be hyponatremic with a sodium of 129.  She reports that she is aware of this issue and has been told in the past that she needs to increase the salt in her diet and decrease the amount of water  that she drinks.  Discussed plan for discharge with a prescription for Keflex  and need to follow-up with her primary care physician within the next few days for recheck of her symptoms.  She will return to the emergency department for new or worsening symptoms.      FINAL CLINICAL IMPRESSION(S) / ED DIAGNOSES   Final diagnoses:  Acute cystitis without hematuria  Hyponatremia     Rx / DC Orders   ED Discharge Orders          Ordered    cephALEXin  (KEFLEX ) 500 MG capsule  2 times daily,   Status:  Discontinued        09/25/24 1223    cephALEXin  (KEFLEX ) 500 MG capsule  2 times daily        09/25/24 1223             Note:  This document was prepared using Dragon voice recognition software and may include unintentional dictation errors.   Rexford Reche HERO, MD 09/25/24 1547  "

## 2024-09-25 NOTE — Discharge Instructions (Addendum)
 Please take your antibiotic as prescribed.  Please continue to monitor your water  intake given your low sodium level.  Follow-up with your primary care physician for a recheck of your symptoms.

## 2024-09-25 NOTE — ED Triage Notes (Addendum)
 Pt arrives via POV with c/o a syncopal episode around 0430 this morning and grandson found pt with their head on their bedside table after hearing some noise. Pt also concerned they might have a UTI, denies burning/pain when they urinate. Pt endorses a HA. Pt is A&Ox4 and ambulatory during triage.

## 2024-09-25 NOTE — ED Notes (Signed)
 Patient is being discharged from the Urgent Care and sent to the [facility] Emergency Department via private vehicle with family . Per Dr. Kriste, patient is in need of higher level of care due to Syncope this moring. Patient is aware and verbalizes understanding of plan of care.  Vitals:   09/25/24 0845  BP: 132/66  Pulse: 88  Temp: 97.7 F (36.5 C)  SpO2: 100%

## 2024-09-25 NOTE — ED Triage Notes (Signed)
 Pt c/o urinary frequency onset this morning. She states she feels very thirsty and has been vomiting. She states this morning her grandson found her leaned over the bedside table unconscious. Pt denies hitting her head. Daughter states her bp was 180/70 this morning around 7 am.

## 2024-09-27 ENCOUNTER — Inpatient Hospital Stay: Admitting: Oncology

## 2024-09-27 ENCOUNTER — Other Ambulatory Visit

## 2024-09-27 VITALS — Wt 163.0 lb

## 2024-09-27 DIAGNOSIS — Z79811 Long term (current) use of aromatase inhibitors: Secondary | ICD-10-CM

## 2024-09-27 DIAGNOSIS — Z5181 Encounter for therapeutic drug level monitoring: Secondary | ICD-10-CM | POA: Diagnosis not present

## 2024-09-27 DIAGNOSIS — C50412 Malignant neoplasm of upper-outer quadrant of left female breast: Secondary | ICD-10-CM

## 2024-09-27 DIAGNOSIS — Z170 Estrogen receptor positive status [ER+]: Secondary | ICD-10-CM | POA: Diagnosis not present

## 2024-09-27 DIAGNOSIS — Z720 Tobacco use: Secondary | ICD-10-CM

## 2024-09-27 DIAGNOSIS — E871 Hypo-osmolality and hyponatremia: Secondary | ICD-10-CM | POA: Diagnosis not present

## 2024-09-27 MED ORDER — LETROZOLE 2.5 MG PO TABS: 2.5000 mg | ORAL_TABLET | Freq: Every day | ORAL | 1 refills | Status: AC

## 2024-09-27 NOTE — Assessment & Plan Note (Signed)
 Patient reports hydrating herself with plenty of water . Recommend patient to drink Gatorade. Offered patient to get additional blood work for further workup of hyponatremia.  Patient elects to try Gatorade and follow-up and repeat blood work with primary care provider.

## 2024-09-27 NOTE — Assessment & Plan Note (Addendum)
 Recommend smoke cessation. October 2025 CT chest without contrast lung cancer screening-no suspicious lung lesions.  Chronic findings were reviewed and discussed with patient.

## 2024-09-27 NOTE — Assessment & Plan Note (Signed)
 Intermittent hypercalcemia.  Possibly secondary to hydrochlorothiazide  usage.  Check PTH PTH RP at the next visit

## 2024-09-27 NOTE — Assessment & Plan Note (Addendum)
 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  Stage Ia 07/17/2022 status post lumpectomy with sentinel lymph node biopsy.  Oncotype DX showed recurrence score of 18.  No adjuvant chemotherapy. 09/26/2022 status post adjuvant radiation to left breast. 10/03/2022 started endocrine therapy with letrozole  2.5 mg daily.  Plan for 5 years. Overall she tolerates well.  Manageable hot flashes.  Continue current regimen. Recommend annual mammogram-October 2025 results are reviewed.  Patient previously declined genetic testing.

## 2024-09-27 NOTE — Progress Notes (Signed)
 HEMATOLOGY-ONCOLOGY TeleHEALTH VISIT PROGRESS NOTE  I connected with Malie Kashani Machuca on 09/27/24  at 11:00 AM EST by video enabled telemedicine visit and verified that I am speaking with the correct person using two identifiers. I discussed the limitations, risks, security and privacy concerns of performing an evaluation and management service by telemedicine and the availability of in-person appointments. The patient expressed understanding and agreed to proceed.   Other persons participating in the visit and their role in the encounter:  None  Patient's location: Home  Provider's location: office Chief Complaint:  left stage I breast cancer  INTERVAL HISTORY ELISABETH STROM is a 69 y.o. female who has above history reviewed by me today presents for follow up visit for management of left stage I breast cancer Patient reports that she recently passed out and went to emergency room.  She also had increased urinary frequency.  She was evaluated in ER and was diagnosed with UTI and currently takes antibiotics. She takes letrozole  and tolerates well.  Review of Systems  Constitutional:  Positive for fatigue. Negative for appetite change, chills and fever.  HENT:   Negative for hearing loss and voice change.   Eyes:  Negative for eye problems.  Respiratory:  Negative for chest tightness and cough.   Cardiovascular:  Negative for chest pain.  Gastrointestinal:  Negative for abdominal distention, abdominal pain and blood in stool.  Endocrine: Negative for hot flashes.  Genitourinary:  Positive for frequency. Negative for difficulty urinating.   Musculoskeletal:  Negative for arthralgias.  Skin:  Negative for itching and rash.  Neurological:  Negative for extremity weakness.  Hematological:  Negative for adenopathy.  Psychiatric/Behavioral:  Negative for confusion.     Past Medical History:  Diagnosis Date   Anxiety    Breast cancer (HCC)    Cancer (HCC)    Carbuncle and furuncle of trunk    COPD  (chronic obstructive pulmonary disease) (HCC)    Depression    Diabetes mellitus without complication (HCC)    Dyspnea    GERD (gastroesophageal reflux disease)    HTN (hypertension)    Hyperlipidemia    Intrinsic asthma, unspecified    Methicillin resistant Staphylococcus aureus in conditions classified elsewhere and of unspecified site    Other psychological or physical stress, not elsewhere classified(V62.89)    Personal history of radiation therapy    Pneumonia    Pre-diabetes    Tobacco abuse    Past Surgical History:  Procedure Laterality Date   BREAST BIOPSY Left 06/26/2022   stereo biopsy/ coil clip/ positive   BREAST LUMPECTOMY Left 07/17/2022   BREAST LUMPECTOMY,RADIO FREQ LOCALIZER,AXILLARY SENTINEL LYMPH NODE BIOPSY Left 07/17/2022   Procedure: BREAST LUMPECTOMY,RADIO FREQ LOCALIZER,AXILLARY SENTINEL LYMPH NODE BIOPSY;  Surgeon: Desiderio Schanz, MD;  Location: [facility];  Service: General;  Laterality: Left;   COLONOSCOPY WITH PROPOFOL  N/A 04/10/2015   Procedure: COLONOSCOPY WITH PROPOFOL ;  Surgeon: Rogelia Copping, MD;  Location: [facility];  Service: Endoscopy;  Laterality: N/A;   COLONOSCOPY WITH PROPOFOL  N/A 06/25/2021   Procedure: COLONOSCOPY WITH PROPOFOL ;  Surgeon: Copping Rogelia, MD;  Location: [facility];  Service: Endoscopy;  Laterality: N/A;  leave at 8 arrival   ESOPHAGOGASTRODUODENOSCOPY (EGD) WITH PROPOFOL  N/A 12/06/2021   Procedure: ESOPHAGOGASTRODUODENOSCOPY (EGD) WITH PROPOFOL ;  Surgeon: Copping Rogelia, MD;  Location: [facility];  Service: Endoscopy;  Laterality: N/A;   POLYPECTOMY  04/10/2015   Procedure: POLYPECTOMY;  Surgeon: Rogelia Copping, MD;  Location: [facility];  Service: Endoscopy;;  POLYPECTOMY N/A 06/25/2021   Procedure: POLYPECTOMY;  Surgeon: Jinny Carmine, MD;  Location: [facility];  Service: Endoscopy;  Laterality: N/A;   STATUS POST ENDOMETRIAL ABLATION     TONSILLECTOMY     TUBAL LIGATION     bilateral,  sterile ablation   VARICOSE VEINS      Family History  Problem Relation Age of Onset   Coronary artery disease Mother    Hypertension Mother    Rheum arthritis Mother    Heart attack Mother    Lung cancer Father    Emphysema Father    Gout Father    Diabetes Sister    Hypertension Daughter    Obesity Daughter    Hypertension Daughter    Obesity Daughter    Obesity Daughter    Seizures Daughter    Breast cancer Neg Hx    Bladder Cancer Neg Hx    Kidney cancer Neg Hx     Social History   Socioeconomic History   Marital status: Widowed    Spouse name: Not on file   Number of children: 3   Years of education: 12   Highest education level: GED or equivalent  Occupational History   Occupation: [employer]    Comment: CNA  Tobacco Use   Smoking status: Every Day    Current packs/day: 0.50    Average packs/day: 0.5 packs/day for 37.7 years (18.8 ttl pk-yrs)    Types: Cigarettes    Start date: 02/01/1987    Passive exposure: Past   Smokeless tobacco: Never   Tobacco comments:    Currently smoking 10 cigarettes a day  Vaping Use   Vaping status: Never Used  Substance and Sexual Activity   Alcohol use: No   Drug use: No   Sexual activity: Not Currently    Birth control/protection: Abstinence  Other Topics Concern   Not on file  Social History Narrative   Lives alone - daughter stays with her most of the time   Social Drivers of Health   Tobacco Use: High Risk (09/25/2024)   Patient History    Smoking Tobacco Use: Every Day    Smokeless Tobacco Use: Never    Passive Exposure: Past  Financial Resource Strain: Medium Risk (09/15/2024)   Overall Financial Resource Strain (CARDIA)    Difficulty of Paying Living Expenses: Somewhat hard  Food Insecurity: No Food Insecurity (09/15/2024)   Epic    Worried About Programme Researcher, Broadcasting/film/video in the Last Year: Never true    Ran Out of Food in the Last Year: Never true  Transportation Needs: No Transportation Needs  (09/15/2024)   Epic    Lack of Transportation (Medical): No    Lack of Transportation (Non-Medical): No  Physical Activity: Insufficiently Active (09/15/2024)   Exercise Vital Sign    Days of Exercise per Week: 4 days    Minutes of Exercise per Session: 30 min  Stress: No Stress Concern Present (09/15/2024)   [employer] of Occupational Health - Occupational Stress Questionnaire    Feeling of Stress: Only a little  Social Connections: Moderately Isolated (09/15/2024)   Social Connection and Isolation Panel    Frequency of Communication with Friends and Family: More than three times a week    Frequency of Social Gatherings with Friends and Family: More than three times a week    Attends Religious Services: More than 4 times per year    Active Member of Clubs or Organizations: No    Attends  Banker Meetings: Not on file    Marital Status: Widowed  Intimate Partner Violence: Not At Risk (03/17/2024)   Epic    Fear of Current or Ex-Partner: No    Emotionally Abused: No    Physically Abused: No    Sexually Abused: No  Depression (PHQ2-9): Low Risk (09/17/2024)   Depression (PHQ2-9)    PHQ-2 Score: 0  Alcohol Screen: Low Risk (03/14/2024)   Alcohol Screen    Last Alcohol Screening Score (AUDIT): 0  Housing: Low Risk (09/15/2024)   Epic    Unable to Pay for Housing in the Last Year: No    Number of Times Moved in the Last Year: 0    Homeless in the Last Year: No  Utilities: Not At Risk (03/17/2024)   Epic    Threatened with loss of utilities: No  Health Literacy: Adequate Health Literacy (03/17/2024)   B1300 Health Literacy    Frequency of need for help with medical instructions: Never    Medications Ordered Prior to Encounter[1]  Allergies[2]     Observations/Objective: Today's Vitals   09/24/24 1452  Weight: 163 lb (73.9 kg)   Body mass index is 26.71 kg/m.  Physical Exam  CBC    Component Value Date/Time   WBC 5.3 09/25/2024 1046   RBC 5.05 09/25/2024  1046   HGB 14.5 09/25/2024 1046   HGB 13.4 09/24/2024 1250   HGB 14.5 04/19/2024 1115   HCT 43.5 09/25/2024 1046   HCT 44.3 04/19/2024 1115   PLT 271 09/25/2024 1046   PLT 268 09/24/2024 1250   PLT 329 04/19/2024 1115   MCV 86.1 09/25/2024 1046   MCV 88 04/19/2024 1115   MCV 88 12/25/2014 0520   MCH 28.7 09/25/2024 1046   MCHC 33.3 09/25/2024 1046   RDW 13.2 09/25/2024 1046   RDW 13.4 04/19/2024 1115   RDW 13.8 12/25/2014 0520   LYMPHSABS 1.2 09/24/2024 1250   LYMPHSABS 3.0 04/16/2023 1018   LYMPHSABS 1.9 12/25/2014 0520   MONOABS 0.6 09/24/2024 1250   MONOABS 0.7 12/25/2014 0520   EOSABS 0.1 09/24/2024 1250   EOSABS 0.3 04/16/2023 1018   EOSABS 0.2 12/25/2014 0520   BASOSABS 0.1 09/24/2024 1250   BASOSABS 0.1 04/16/2023 1018   BASOSABS 0.1 12/25/2014 0520    CMP     Component Value Date/Time   NA 129 (L) 09/25/2024 1046   NA 135 04/19/2024 1115   NA 133 (L) 12/25/2014 0520   K 3.9 09/25/2024 1046   K 3.6 12/25/2014 0520   CL 91 (L) 09/25/2024 1046   CL 100 (L) 12/25/2014 0520   CO2 27 09/25/2024 1046   CO2 27 12/25/2014 0520   GLUCOSE 174 (H) 09/25/2024 1046   GLUCOSE 98 12/25/2014 0520   BUN 7 (L) 09/25/2024 1046   BUN 7 (L) 04/19/2024 1115   BUN 10 12/25/2014 0520   CREATININE 0.67 09/25/2024 1046   CREATININE 0.63 09/24/2024 1251   CREATININE 0.53 12/26/2014 0548   CALCIUM  10.4 (H) 09/25/2024 1046   CALCIUM  9.2 12/25/2014 0520   PROT 6.9 09/25/2024 1046   PROT 6.9 04/19/2024 1115   PROT 7.1 12/17/2014 1735   ALBUMIN 4.3 09/25/2024 1046   ALBUMIN 4.6 04/19/2024 1115   ALBUMIN 4.2 12/17/2014 1735   AST 19 09/25/2024 1046   AST 18 09/24/2024 1251   ALT 10 09/25/2024 1046   ALT 10 09/24/2024 1251   ALT 13 (L) 12/17/2014 1735   ALKPHOS 111 09/25/2024 1046   ALKPHOS  79 12/17/2014 1735   BILITOT 0.3 09/25/2024 1046   BILITOT 0.3 09/24/2024 1251   GFRNONAA >60 09/25/2024 1046   GFRNONAA >60 09/24/2024 1251   GFRNONAA >60 12/26/2014 0548   GFRAA 97  04/03/2020 1133   GFRAA >60 12/26/2014 0548      ASSESSMENT & PLAN:   Breast cancer, left (HCC) 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  Stage Ia 07/17/2022 status post lumpectomy with sentinel lymph node biopsy.  Oncotype DX showed recurrence score of 18.  No adjuvant chemotherapy. 09/26/2022 status post adjuvant radiation to left breast. 10/03/2022 started endocrine therapy with letrozole  2.5 mg daily.  Plan for 5 years. Overall she tolerates well.  Manageable hot flashes.  Continue current regimen. Recommend annual mammogram-October 2025 results are reviewed.  Patient previously declined genetic testing.  Long term current use of aromatase inhibitor Recommend calcium  supplementation 3 times per week.   06/14/2024  DEXA showed normal bone density.   Tobacco use Recommend smoke cessation. October 2025 CT chest without contrast lung cancer screening-no suspicious lung lesions.  Chronic findings were reviewed and discussed with patient.  Hypercalcemia Intermittent hypercalcemia.  Possibly secondary to hydrochlorothiazide  usage.  Check PTH PTH RP at the next visit  Hyponatremia Patient reports hydrating herself with plenty of water . Recommend patient to drink Gatorade. Offered patient to get additional blood work for further workup of hyponatremia.  Patient elects to try Gatorade and follow-up and repeat blood work with primary care provider.  Orders Placed This Encounter  Procedures   CBC with Differential/Platelet    Standing Status:   Future    Expected Date:   03/27/2025    Expiration Date:   06/25/2025   Comprehensive metabolic panel with GFR    Standing Status:   Future    Expected Date:   03/27/2025    Expiration Date:   06/25/2025   PTH-related peptide    Standing Status:   Future    Expected Date:   03/27/2025    Expiration Date:   06/25/2025   Parathyroid hormone, intact (no Ca)    Standing Status:   Future    Expected  Date:   03/27/2025    Expiration Date:   06/25/2025     I discussed the assessment and treatment plan with the patient. The patient was provided an opportunity to ask questions and all were answered. The patient agreed with the plan and demonstrated an understanding of the instructions.  The patient was advised to call back or seek an in-person evaluation if the symptoms worsen or if the condition fails to improve as anticipated.   I provided 25 minutes of face-to-face video visit time during this encounter, and > 50% was spent counseling as documented under my assessment & plan.  Zelphia Cap, MD 09/27/2024 11:47 AM      [1]  Current Outpatient Medications on File Prior to Visit  Medication Sig Dispense Refill   acetaminophen  (TYLENOL ) 500 MG tablet Take 1,000 mg by mouth every 6 (six) hours as needed for mild pain.     albuterol  (VENTOLIN  HFA) 108 (90 Base) MCG/ACT inhaler Inhale 1-2 puffs into the lungs every 6 (six) hours as needed for wheezing or shortness of breath. 18 g 11   [START ON 10/16/2024] ALPRAZolam  (XANAX ) 0.25 MG tablet Take 1 tablet (0.25 mg total) by mouth 2 (two) times daily as needed. for anxiety 60 tablet 0   amLODipine  (NORVASC ) 10 MG tablet TAKE 1 TABLET BY  MOUTH DAILY 100 tablet 2   aspirin EC 81 MG tablet Take 81 mg by mouth daily. Swallow whole.     azelastine  (ASTELIN ) 0.1 % nasal spray Place 1 spray into both nostrils daily. Use in each nostril as directed 30 mL 12   Calcium  Carb-Cholecalciferol (CALCIUM  PLUS VITAMIN D3) 600-20 MG-MCG TABS Take 1 tablet by mouth every Monday, Wednesday, and Friday.     cetirizine (ZYRTEC) 10 MG tablet Take 1 tablet (10 mg total) by mouth daily. 30 tablet 11   famotidine  (PEPCID ) 20 MG tablet Take 20 mg by mouth as needed for heartburn.     hydrochlorothiazide  (HYDRODIURIL ) 25 MG tablet Take 1 tablet (25 mg total) by mouth daily. 90 tablet 2   lisinopril  (ZESTRIL ) 40 MG tablet TAKE 1 TABLET BY MOUTH DAILY 100 tablet 2   metFORMIN   (GLUCOPHAGE -XR) 750 MG 24 hr tablet Take 1 tablet (750 mg total) by mouth daily with breakfast. 100 tablet 1   Multiple Vitamin (MULTIVITAMIN ADULT PO) Take 1 tablet by mouth daily.     PARoxetine  (PAXIL ) 10 MG tablet Take 1 tablet (10 mg total) by mouth daily. 90 tablet 3   rosuvastatin  (CRESTOR ) 40 MG tablet TAKE 1 TABLET BY MOUTH DAILY 90 tablet 3   Spacer/Aero-Holding Chambers (AEROCHAMBER MV) inhaler Use as instructed 1 each 1   TRELEGY ELLIPTA  100-62.5-25 MCG/ACT AEPB USE 1 INHALATION BY MOUTH DAILY 180 each 3   [START ON 11/13/2024] ALPRAZolam  (XANAX ) 0.25 MG tablet Take 1 tablet (0.25 mg total) by mouth 2 (two) times daily as needed. for anxiety (Patient not taking: Reported on 09/24/2024) 60 tablet 0   cephALEXin  (KEFLEX ) 500 MG capsule Take 1 capsule (500 mg total) by mouth 2 (two) times daily for 7 days. 14 capsule 0   No current facility-administered medications on file prior to visit.  [2]  Allergies Allergen Reactions   Pravastatin Sodium     Myalgia

## 2024-09-27 NOTE — Assessment & Plan Note (Addendum)
 Recommend calcium  supplementation 3 times per week.   06/14/2024  DEXA showed normal bone density.

## 2024-10-07 ENCOUNTER — Ambulatory Visit

## 2024-10-07 VITALS — BP 120/53 | HR 89 | Temp 97.5°F | Wt 162.1 lb

## 2024-10-07 DIAGNOSIS — E871 Hypo-osmolality and hyponatremia: Secondary | ICD-10-CM

## 2024-10-07 DIAGNOSIS — N390 Urinary tract infection, site not specified: Secondary | ICD-10-CM | POA: Insufficient documentation

## 2024-10-07 DIAGNOSIS — N8110 Cystocele, unspecified: Secondary | ICD-10-CM | POA: Insufficient documentation

## 2024-10-07 DIAGNOSIS — E1159 Type 2 diabetes mellitus with other circulatory complications: Secondary | ICD-10-CM

## 2024-10-07 MED ORDER — HYDROCHLOROTHIAZIDE 12.5 MG PO TABS: 12.5000 mg | ORAL_TABLET | Freq: Every day | ORAL | 3 refills | Status: AC

## 2024-10-07 NOTE — Progress Notes (Signed)
 "    Established patient visit   Patient: Alexandria [name]   DOB: 10-28-1955   69 y.o. Female  MRN: [id number] Visit Date: 10/07/2024  Today's healthcare provider: Isaiah DELENA Pepper, MD   Chief Complaint  Patient presents with   Hospitalization Follow-up   Subjective    HPI  Discussed the use of AI scribe software for clinical note transcription with the patient, who gave verbal consent to proceed.  History of Present Illness Alexandria [name] is a 69 year old female who presents with concerns about low sodium and blood pressure management. She is accompanied by her daughter, Rosina.  She has been experiencing recurrent urinary tract infections (UTIs), with the most recent episode leading to an emergency room visit. No symptoms were present prior to syncope, which has occurred twice. She completed a course of antibiotics prescribed for the UTI. Her daughter, Rosina, expressed concern about the frequency of these infections and the potential for serious injury if she experiences syncope again.  Following the UTI, she developed upper respiratory symptoms characterized by coughing, sneezing, and rhinorrhea. She took cough medicine to manage these symptoms. She reports that her appetite and energy levels have since improved.  She has a history of hyponatremia. She has been on hydrochlorothiazide  for years and was started on lisinopril  about a year ago due to rising blood pressure. She monitors her blood pressure at home, noting that it has been low, with a recent reading of 99/82 mmHg.  Rosina is concerned about her mother's urinary incontinence, which occurs primarily during coughing or sneezing episodes. She has a history of bladder prolapse, which was identified years ago during a Pap smear. She uses incontinence pads, which may contribute to the risk of UTIs if not changed promptly.   Medications: Show/hide medication list[1]  Review of Systems as noted in HPI.       Objective    BP (!)  120/53   Pulse 89   Temp (!) 97.5 F (36.4 C) (Oral)   Wt 162 lb 1.6 oz (73.5 kg)   BMI 26.56 kg/m     Physical Exam Constitutional:      Appearance: Normal appearance.  HENT:     Head: Normocephalic and atraumatic.     Mouth/Throat:     Mouth: Mucous membranes are moist.  Eyes:     Pupils: Pupils are equal, round, and reactive to light.  Pulmonary:     Effort: Pulmonary effort is normal.  Skin:    General: Skin is warm.  Neurological:     General: No focal deficit present.     Mental Status: She is alert.      No results found for any visits on 10/07/24.  Assessment & Plan     Problem List Items Addressed This Visit       Cardiovascular and Mediastinum   Hypertension associated with diabetes (HCC) - Primary   Relevant Medications   hydrochlorothiazide  (HYDRODIURIL ) 12.5 MG tablet   Other Relevant Orders   Basic Metabolic Panel (BMET)     Genitourinary   Recurrent UTI   Relevant Orders   Ambulatory referral to Urology   Female bladder prolapse   Relevant Orders   Ambulatory referral to Urology     Other   Hypercalcemia (Chronic)   Relevant Orders   Basic Metabolic Panel (BMET)   Hyponatremia (Chronic)   Relevant Orders   Basic Metabolic Panel (BMET)   Assessment & Plan Recurrent urinary tract infection Bladder Prolapse Patient follows up  after second urinary tract infection in the past 6 months prompting multiple ED visits. Possible contributing factors include pelvic organ prolapse and incontinence. Reports hx of bladder prolapse. - Referred to urology for evaluation of recurrent UTIs and bladder prolapse. - Advised to change incontinence products promptly to reduce infection risk. - Educated on proper hygiene practices  Hypertension Patient with borderline low BP readings. Concerned that recent syncopal episodes may be due to low BP. Current regimen includes hydrochlorothiazide , lisinopril , and amlodipine .  - Decreased hydrochlorothiazide  dose  from 25 mg to 12.5 mg daily. - Instructed to monitor blood pressure at home - Advised to report blood pressure readings via MyChart or phone call. - Will consider discontinuing hydrochlorothiazide  if blood pressure remains low.  Hyponatremia and hypercalcemia due to diuretic therapy Hyponatremia and hypercalcemia likely secondary to long-term hydrochlorothiazide  use. Plan to adjust hydrochlorothiazide  dosage to address electrolyte imbalances. - Will reduce hydrochlorothiazide  to 12.5mg   - Rechecked electrolytes today.      No follow-ups on file.       Isaiah DELENA Pepper, MD  [facility] [phone] (phone) [phone] (fax)     [1]  Outpatient Medications Prior to Visit  Medication Sig   acetaminophen  (TYLENOL ) 500 MG tablet Take 1,000 mg by mouth every 6 (six) hours as needed for mild pain.   albuterol  (VENTOLIN  HFA) 108 (90 Base) MCG/ACT inhaler Inhale 1-2 puffs into the lungs every 6 (six) hours as needed for wheezing or shortness of breath.   [START ON 10/16/2024] ALPRAZolam  (XANAX ) 0.25 MG tablet Take 1 tablet (0.25 mg total) by mouth 2 (two) times daily as needed. for anxiety   amLODipine  (NORVASC ) 10 MG tablet TAKE 1 TABLET BY MOUTH DAILY   aspirin EC 81 MG tablet Take 81 mg by mouth daily. Swallow whole.   azelastine  (ASTELIN ) 0.1 % nasal spray Place 1 spray into both nostrils daily. Use in each nostril as directed   Calcium  Carb-Cholecalciferol (CALCIUM  PLUS VITAMIN D3) 600-20 MG-MCG TABS Take 1 tablet by mouth every Monday, Wednesday, and Friday.   cetirizine (ZYRTEC) 10 MG tablet Take 1 tablet (10 mg total) by mouth daily.   famotidine  (PEPCID ) 20 MG tablet Take 20 mg by mouth as needed for heartburn.   letrozole  (FEMARA ) 2.5 MG tablet Take 1 tablet (2.5 mg total) by mouth daily.   lisinopril  (ZESTRIL ) 40 MG tablet TAKE 1 TABLET BY MOUTH DAILY   metFORMIN  (GLUCOPHAGE -XR) 750 MG 24 hr tablet Take 1 tablet (750 mg total) by mouth daily with  breakfast.   Multiple Vitamin (MULTIVITAMIN ADULT PO) Take 1 tablet by mouth daily.   PARoxetine  (PAXIL ) 10 MG tablet Take 1 tablet (10 mg total) by mouth daily.   rosuvastatin  (CRESTOR ) 40 MG tablet TAKE 1 TABLET BY MOUTH DAILY   TRELEGY ELLIPTA  100-62.5-25 MCG/ACT AEPB USE 1 INHALATION BY MOUTH DAILY   [DISCONTINUED] hydrochlorothiazide  (HYDRODIURIL ) 25 MG tablet Take 1 tablet (25 mg total) by mouth daily.   [START ON 11/13/2024] ALPRAZolam  (XANAX ) 0.25 MG tablet Take 1 tablet (0.25 mg total) by mouth 2 (two) times daily as needed. for anxiety (Patient not taking: Reported on 10/07/2024)   Spacer/Aero-Holding Chambers (AEROCHAMBER MV) inhaler Use as instructed   No facility-administered medications prior to visit.   "

## 2024-10-08 ENCOUNTER — Ambulatory Visit: Payer: Self-pay

## 2024-10-08 LAB — BASIC METABOLIC PANEL WITH GFR
BUN/Creatinine Ratio: 11 — ABNORMAL LOW (ref 12–28)
BUN: 8 mg/dL (ref 8–27)
CO2: 25 mmol/L (ref 20–29)
Calcium: 10.4 mg/dL — ABNORMAL HIGH (ref 8.7–10.3)
Chloride: 91 mmol/L — ABNORMAL LOW (ref 96–106)
Creatinine, Ser: 0.75 mg/dL (ref 0.57–1.00)
Glucose: 139 mg/dL — ABNORMAL HIGH (ref 70–99)
Potassium: 3.9 mmol/L (ref 3.5–5.2)
Sodium: 130 mmol/L — ABNORMAL LOW (ref 134–144)
eGFR: 86 mL/min/{1.73_m2}

## 2024-11-08 ENCOUNTER — Ambulatory Visit: Admitting: Radiation Oncology

## 2024-12-16 ENCOUNTER — Ambulatory Visit

## 2025-03-22 ENCOUNTER — Ambulatory Visit

## 2025-03-24 ENCOUNTER — Inpatient Hospital Stay: Admitting: Oncology

## 2025-03-24 ENCOUNTER — Inpatient Hospital Stay

## 2025-03-30 ENCOUNTER — Ambulatory Visit

## 2025-04-26 ENCOUNTER — Encounter
